# Patient Record
Sex: Male | Born: 1940 | Race: White | Hispanic: No | Marital: Married | Smoking: Former smoker
Health system: Southern US, Community
[De-identification: ages and names within clinical notes are randomized; demographics above are authoritative.]

## PROBLEM LIST (undated history)

## (undated) DIAGNOSIS — E785 Hyperlipidemia, unspecified: Secondary | ICD-10-CM

## (undated) DIAGNOSIS — Z860100 Personal history of colon polyps, unspecified: Secondary | ICD-10-CM

## (undated) DIAGNOSIS — R7303 Prediabetes: Secondary | ICD-10-CM

## (undated) DIAGNOSIS — C4491 Basal cell carcinoma of skin, unspecified: Secondary | ICD-10-CM

## (undated) DIAGNOSIS — Z974 Presence of external hearing-aid: Secondary | ICD-10-CM

## (undated) DIAGNOSIS — I10 Essential (primary) hypertension: Secondary | ICD-10-CM

## (undated) DIAGNOSIS — E669 Obesity, unspecified: Secondary | ICD-10-CM

## (undated) DIAGNOSIS — H269 Unspecified cataract: Secondary | ICD-10-CM

## (undated) DIAGNOSIS — H35033 Hypertensive retinopathy, bilateral: Secondary | ICD-10-CM

## (undated) DIAGNOSIS — E66811 Obesity, class 1: Secondary | ICD-10-CM

## (undated) DIAGNOSIS — K5730 Diverticulosis of large intestine without perforation or abscess without bleeding: Secondary | ICD-10-CM

## (undated) HISTORY — DX: Obesity, class 1: E66.811

## (undated) HISTORY — DX: Prediabetes: R73.03

## (undated) HISTORY — DX: Basal cell carcinoma of skin, unspecified: C44.91

## (undated) HISTORY — DX: Hypertensive retinopathy, bilateral: H35.033

## (undated) HISTORY — DX: Personal history of colonic polyps: Z86.010

## (undated) HISTORY — DX: Personal history of colon polyps, unspecified: Z86.0100

## (undated) HISTORY — PX: POLYPECTOMY: SHX149

## (undated) HISTORY — DX: Hyperlipidemia, unspecified: E78.5

## (undated) HISTORY — DX: Essential (primary) hypertension: I10

## (undated) HISTORY — DX: Diverticulosis of large intestine without perforation or abscess without bleeding: K57.30

## (undated) HISTORY — DX: Presence of external hearing-aid: Z97.4

## (undated) HISTORY — DX: Obesity, unspecified: E66.9

## (undated) HISTORY — DX: Unspecified cataract: H26.9

---

## 1997-05-22 ENCOUNTER — Encounter: Payer: Self-pay | Admitting: Family Medicine

## 1997-05-22 LAB — CONVERTED CEMR LAB: PSA: 1.2 ng/mL

## 1999-06-22 ENCOUNTER — Encounter: Payer: Self-pay | Admitting: Family Medicine

## 1999-06-22 LAB — CONVERTED CEMR LAB: PSA: 1.4 ng/mL

## 1999-08-20 ENCOUNTER — Encounter (INDEPENDENT_AMBULATORY_CARE_PROVIDER_SITE_OTHER): Payer: Self-pay

## 1999-08-20 ENCOUNTER — Other Ambulatory Visit: Admission: RE | Admit: 1999-08-20 | Discharge: 1999-08-20 | Payer: Self-pay | Admitting: Gastroenterology

## 2001-02-19 ENCOUNTER — Encounter: Payer: Self-pay | Admitting: Family Medicine

## 2001-02-19 LAB — CONVERTED CEMR LAB: PSA: 1.9 ng/mL

## 2001-05-10 LAB — FECAL OCCULT BLOOD, GUAIAC: Fecal Occult Blood: NEGATIVE

## 2002-07-22 ENCOUNTER — Encounter: Payer: Self-pay | Admitting: Family Medicine

## 2003-01-20 ENCOUNTER — Encounter: Payer: Self-pay | Admitting: Family Medicine

## 2003-02-20 ENCOUNTER — Encounter: Payer: Self-pay | Admitting: Family Medicine

## 2003-11-08 ENCOUNTER — Encounter: Admission: RE | Admit: 2003-11-08 | Discharge: 2003-11-08 | Payer: Self-pay | Admitting: General Surgery

## 2003-11-12 ENCOUNTER — Ambulatory Visit (HOSPITAL_BASED_OUTPATIENT_CLINIC_OR_DEPARTMENT_OTHER): Admission: RE | Admit: 2003-11-12 | Discharge: 2003-11-12 | Payer: Self-pay | Admitting: General Surgery

## 2003-11-12 ENCOUNTER — Ambulatory Visit (HOSPITAL_COMMUNITY): Admission: RE | Admit: 2003-11-12 | Discharge: 2003-11-12 | Payer: Self-pay | Admitting: General Surgery

## 2003-11-20 ENCOUNTER — Encounter: Payer: Self-pay | Admitting: Family Medicine

## 2004-01-07 ENCOUNTER — Ambulatory Visit (HOSPITAL_BASED_OUTPATIENT_CLINIC_OR_DEPARTMENT_OTHER): Admission: RE | Admit: 2004-01-07 | Discharge: 2004-01-07 | Payer: Self-pay | Admitting: General Surgery

## 2004-01-07 ENCOUNTER — Ambulatory Visit (HOSPITAL_COMMUNITY): Admission: RE | Admit: 2004-01-07 | Discharge: 2004-01-07 | Payer: Self-pay | Admitting: General Surgery

## 2004-01-07 HISTORY — PX: UMBILICAL HERNIA REPAIR: SHX196

## 2004-10-24 ENCOUNTER — Ambulatory Visit: Payer: Self-pay | Admitting: Gastroenterology

## 2004-11-07 ENCOUNTER — Ambulatory Visit: Payer: Self-pay | Admitting: Gastroenterology

## 2005-01-27 ENCOUNTER — Ambulatory Visit: Payer: Self-pay | Admitting: Family Medicine

## 2005-06-21 ENCOUNTER — Encounter: Payer: Self-pay | Admitting: Family Medicine

## 2005-06-21 LAB — CONVERTED CEMR LAB: Hgb A1c MFr Bld: 5.5 %

## 2005-07-10 ENCOUNTER — Ambulatory Visit: Payer: Self-pay | Admitting: Family Medicine

## 2005-07-14 ENCOUNTER — Ambulatory Visit: Payer: Self-pay | Admitting: Family Medicine

## 2005-08-21 ENCOUNTER — Encounter: Payer: Self-pay | Admitting: Family Medicine

## 2005-08-21 LAB — CONVERTED CEMR LAB: PSA: 2.65 ng/mL

## 2005-09-09 ENCOUNTER — Ambulatory Visit: Payer: Self-pay | Admitting: Family Medicine

## 2005-09-16 ENCOUNTER — Ambulatory Visit: Payer: Self-pay | Admitting: Family Medicine

## 2005-11-19 ENCOUNTER — Encounter: Payer: Self-pay | Admitting: Family Medicine

## 2005-12-16 ENCOUNTER — Ambulatory Visit: Payer: Self-pay | Admitting: Family Medicine

## 2006-06-21 ENCOUNTER — Encounter: Payer: Self-pay | Admitting: Family Medicine

## 2006-06-21 LAB — CONVERTED CEMR LAB: PSA: 2.15 ng/mL

## 2006-07-14 ENCOUNTER — Ambulatory Visit: Payer: Self-pay | Admitting: Family Medicine

## 2006-07-14 LAB — CONVERTED CEMR LAB
Hgb A1c MFr Bld: 6.1 %
Microalbumin U total vol: 5.2 mg/L

## 2006-07-16 ENCOUNTER — Ambulatory Visit: Payer: Self-pay | Admitting: Family Medicine

## 2006-08-16 ENCOUNTER — Ambulatory Visit: Payer: Self-pay | Admitting: Family Medicine

## 2007-02-10 ENCOUNTER — Ambulatory Visit: Payer: Self-pay | Admitting: Family Medicine

## 2007-02-10 LAB — CONVERTED CEMR LAB
AST: 32 units/L (ref 0–37)
Bilirubin, Direct: 0.3 mg/dL (ref 0.0–0.3)
CO2: 31 meq/L (ref 19–32)
Chloride: 102 meq/L (ref 96–112)
Cholesterol: 139 mg/dL (ref 0–200)
Creatinine, Ser: 1 mg/dL (ref 0.4–1.5)
Creatinine,U: 389.7 mg/dL
Glucose, Bld: 103 mg/dL — ABNORMAL HIGH (ref 70–99)
HDL: 53 mg/dL (ref >39.0)
LDL Cholesterol: 76 mg/dL (ref 0–99)
Sodium: 140 meq/L (ref 135–145)
Total Bilirubin: 1.7 mg/dL — ABNORMAL HIGH (ref 0.3–1.2)
Total Protein: 7.3 g/dL (ref 6.0–8.3)

## 2007-02-16 ENCOUNTER — Encounter: Payer: Self-pay | Admitting: Family Medicine

## 2007-02-16 DIAGNOSIS — F528 Other sexual dysfunction not due to a substance or known physiological condition: Secondary | ICD-10-CM

## 2007-02-16 DIAGNOSIS — K644 Residual hemorrhoidal skin tags: Secondary | ICD-10-CM | POA: Insufficient documentation

## 2007-02-16 DIAGNOSIS — R972 Elevated prostate specific antigen [PSA]: Secondary | ICD-10-CM | POA: Insufficient documentation

## 2007-02-17 ENCOUNTER — Ambulatory Visit: Payer: Self-pay | Admitting: Family Medicine

## 2007-06-11 ENCOUNTER — Encounter: Payer: Self-pay | Admitting: Family Medicine

## 2007-06-11 ENCOUNTER — Ambulatory Visit: Payer: Self-pay | Admitting: Family Medicine

## 2007-07-01 ENCOUNTER — Ambulatory Visit: Payer: Self-pay | Admitting: Family Medicine

## 2007-07-20 ENCOUNTER — Ambulatory Visit: Payer: Self-pay | Admitting: Family Medicine

## 2007-07-20 LAB — CONVERTED CEMR LAB
AST: 21 units/L (ref 0–37)
Alkaline Phosphatase: 85 units/L (ref 39–117)
BUN: 23 mg/dL (ref 6–23)
CO2: 30 meq/L (ref 19–32)
Chloride: 97 meq/L (ref 96–112)
Creatinine, Ser: 0.8 mg/dL (ref 0.4–1.5)
HDL: 59.9 mg/dL (ref >39.0)
Microalb, Ur: 2.5 mg/dL — ABNORMAL HIGH (ref 0.0–1.9)
PSA: 2.92 ng/mL (ref 0.10–4.00)
Potassium: 3.8 meq/L (ref 3.5–5.1)
Sodium: 135 meq/L (ref 135–145)
TSH: 1.47 microintl units/mL (ref 0.35–5.50)
Total Bilirubin: 1.4 mg/dL — ABNORMAL HIGH (ref 0.3–1.2)
Total Protein: 7.8 g/dL (ref 6.0–8.3)
Triglycerides: 68 mg/dL (ref 0–149)

## 2007-07-25 ENCOUNTER — Ambulatory Visit: Payer: Self-pay | Admitting: Family Medicine

## 2007-10-28 ENCOUNTER — Ambulatory Visit: Payer: Self-pay | Admitting: Gastroenterology

## 2007-11-11 ENCOUNTER — Encounter: Payer: Self-pay | Admitting: Family Medicine

## 2007-11-11 ENCOUNTER — Encounter: Payer: Self-pay | Admitting: Gastroenterology

## 2007-11-11 ENCOUNTER — Ambulatory Visit: Payer: Self-pay | Admitting: Gastroenterology

## 2007-12-23 ENCOUNTER — Ambulatory Visit: Payer: Self-pay | Admitting: Family Medicine

## 2007-12-28 ENCOUNTER — Ambulatory Visit: Payer: Self-pay | Admitting: Family Medicine

## 2008-06-20 ENCOUNTER — Ambulatory Visit: Payer: Self-pay | Admitting: Family Medicine

## 2008-07-27 ENCOUNTER — Ambulatory Visit: Payer: Self-pay | Admitting: Family Medicine

## 2008-07-29 LAB — CONVERTED CEMR LAB
Alkaline Phosphatase: 61 units/L (ref 39–117)
Basophils Absolute: 0.1 10*3/uL (ref 0.0–0.1)
Bilirubin, Direct: 0.1 mg/dL (ref 0.0–0.3)
Calcium: 9.5 mg/dL (ref 8.4–10.5)
Cholesterol: 140 mg/dL (ref 0–200)
Eosinophils Absolute: 0 10*3/uL (ref 0.0–0.7)
GFR calc Af Amer: 109 mL/min
GFR calc non Af Amer: 90 mL/min
Glucose, Bld: 107 mg/dL — ABNORMAL HIGH (ref 70–99)
HCT: 46.2 % (ref 39.0–52.0)
HDL: 56.1 mg/dL (ref >39.0)
Hemoglobin: 16.2 g/dL (ref 13.0–17.0)
Hgb A1c MFr Bld: 5.6 % (ref 4.6–6.0)
MCHC: 35.1 g/dL (ref 30.0–36.0)
MCV: 92.7 fL (ref 78.0–100.0)
Microalb Creat Ratio: 6.8 mg/g (ref 0.0–30.0)
Microalb, Ur: 1.1 mg/dL (ref 0.0–1.9)
Monocytes Absolute: 0.8 10*3/uL (ref 0.1–1.0)
Neutro Abs: 5.1 10*3/uL (ref 1.4–7.7)
Platelets: 232 10*3/uL (ref 150–400)
Potassium: 3.6 meq/L (ref 3.5–5.1)
RDW: 11.8 % (ref 11.5–14.6)
Sodium: 134 meq/L — ABNORMAL LOW (ref 135–145)
TSH: 1.73 microintl units/mL (ref 0.35–5.50)
Total CHOL/HDL Ratio: 2.5
Total Protein: 7.4 g/dL (ref 6.0–8.3)
Triglycerides: 72 mg/dL (ref 0–149)

## 2008-08-01 ENCOUNTER — Ambulatory Visit: Payer: Self-pay | Admitting: Family Medicine

## 2009-01-23 ENCOUNTER — Ambulatory Visit: Payer: Self-pay | Admitting: Family Medicine

## 2009-01-23 LAB — CONVERTED CEMR LAB: Hgb A1c MFr Bld: 5.8 % (ref 4.6–6.5)

## 2009-01-30 ENCOUNTER — Ambulatory Visit: Payer: Self-pay | Admitting: Family Medicine

## 2009-06-19 ENCOUNTER — Ambulatory Visit: Payer: Self-pay | Admitting: Family Medicine

## 2009-08-05 ENCOUNTER — Ambulatory Visit: Payer: Self-pay | Admitting: Family Medicine

## 2009-08-05 LAB — CONVERTED CEMR LAB
ALT: 34 units/L (ref 0–53)
BUN: 16 mg/dL (ref 6–23)
Basophils Relative: 0.3 % (ref 0.0–3.0)
CO2: 30 meq/L (ref 19–32)
Calcium: 9.6 mg/dL (ref 8.4–10.5)
Chloride: 97 meq/L (ref 96–112)
Cholesterol: 162 mg/dL (ref 0–200)
Creatinine, Ser: 0.9 mg/dL (ref 0.4–1.5)
Eosinophils Absolute: 0.1 10*3/uL (ref 0.0–0.7)
HDL: 53.9 mg/dL (ref >39.00)
Hemoglobin: 16.6 g/dL (ref 13.0–17.0)
Lymphs Abs: 2 10*3/uL (ref 0.7–4.0)
MCHC: 34.6 g/dL (ref 30.0–36.0)
MCV: 93.6 fL (ref 78.0–100.0)
Monocytes Absolute: 0.9 10*3/uL (ref 0.1–1.0)
Neutro Abs: 6.5 10*3/uL (ref 1.4–7.7)
RBC: 5.12 M/uL (ref 4.22–5.81)
RDW: 11.7 % (ref 11.5–14.6)
Total Bilirubin: 1.6 mg/dL — ABNORMAL HIGH (ref 0.3–1.2)
Total Protein: 7.4 g/dL (ref 6.0–8.3)
Triglycerides: 103 mg/dL (ref 0.0–149.0)

## 2009-08-08 ENCOUNTER — Ambulatory Visit: Payer: Self-pay | Admitting: Family Medicine

## 2010-02-03 ENCOUNTER — Ambulatory Visit: Payer: Self-pay | Admitting: Family Medicine

## 2010-02-10 ENCOUNTER — Ambulatory Visit: Payer: Self-pay | Admitting: Family Medicine

## 2010-04-24 ENCOUNTER — Encounter (INDEPENDENT_AMBULATORY_CARE_PROVIDER_SITE_OTHER): Payer: Self-pay | Admitting: *Deleted

## 2010-06-11 ENCOUNTER — Ambulatory Visit: Payer: Self-pay | Admitting: Family Medicine

## 2010-06-19 ENCOUNTER — Ambulatory Visit: Payer: Self-pay | Admitting: Internal Medicine

## 2010-06-19 DIAGNOSIS — H6120 Impacted cerumen, unspecified ear: Secondary | ICD-10-CM

## 2010-08-06 ENCOUNTER — Telehealth (INDEPENDENT_AMBULATORY_CARE_PROVIDER_SITE_OTHER): Payer: Self-pay | Admitting: *Deleted

## 2010-08-07 ENCOUNTER — Ambulatory Visit: Payer: Self-pay | Admitting: Family Medicine

## 2010-08-07 DIAGNOSIS — E559 Vitamin D deficiency, unspecified: Secondary | ICD-10-CM

## 2010-08-07 LAB — CONVERTED CEMR LAB
ALT: 30 units/L (ref 0–53)
AST: 28 units/L (ref 0–37)
Albumin: 4.5 g/dL (ref 3.5–5.2)
Alkaline Phosphatase: 71 units/L (ref 39–117)
CO2: 30 meq/L (ref 19–32)
Chloride: 94 meq/L — ABNORMAL LOW (ref 96–112)
GFR calc non Af Amer: 90.4 mL/min (ref >60)
Glucose, Bld: 116 mg/dL — ABNORMAL HIGH (ref 70–99)
PSA: 2.98 ng/mL (ref 0.10–4.00)
Potassium: 4.3 meq/L (ref 3.5–5.1)
Sodium: 134 meq/L — ABNORMAL LOW (ref 135–145)
VLDL: 14.4 mg/dL (ref 0.0–40.0)

## 2010-08-08 LAB — CONVERTED CEMR LAB: Vit D, 25-Hydroxy: 58 ng/mL (ref 30–89)

## 2010-08-11 ENCOUNTER — Ambulatory Visit: Payer: Self-pay | Admitting: Internal Medicine

## 2010-08-11 DIAGNOSIS — E119 Type 2 diabetes mellitus without complications: Secondary | ICD-10-CM

## 2010-08-11 DIAGNOSIS — E118 Type 2 diabetes mellitus with unspecified complications: Secondary | ICD-10-CM | POA: Insufficient documentation

## 2010-10-21 NOTE — Progress Notes (Signed)
----   Converted from flag ---- ---- 08/05/2010 2:06 PM, Eustaquio Boyden  MD wrote: and vit D - [phone] ------------------------------

## 2010-10-21 NOTE — Assessment & Plan Note (Signed)
Summary: FLU SHOT/CLE   Nurse Visit   Allergies: No Known Drug Allergies  Immunizations Administered:  Influenza Vaccine # 1:    Vaccine Type: Fluvax 3+    Site: left deltoid    Mfr: GlaxoSmithKline    Dose: 0.5 ml    Route: IM    Given by: Mervin Hack CMA (AAMA)    Exp. Date: 03/21/2011    Lot #: [id number]    VIS given: 04/15/10 version given June 11, 2010.  Flu Vaccine Consent Questions:    Do you have a history of severe allergic reactions to this vaccine? no    Any prior history of allergic reactions to egg and/or gelatin? no    Do you have a sensitivity to the preservative Thimersol? no    Do you have a past history of Guillan-Barre Syndrome? no    Do you currently have an acute febrile illness? no    Have you ever had a severe reaction to latex? no    Vaccine information given and explained to patient? yes  Orders Added: 1)  Flu Vaccine 48yrs + [90658] 2)  Admin 1st Vaccine [29562]

## 2010-10-21 NOTE — Letter (Signed)
Summary: Nadara Eaton letter  [place] at [facility]  [address] [place], [place] [address]   Phone: [phone]  Fax: [phone]       04/24/2010 MRN: [id number]  [facility] [address] [place], [place]  [address]  Dear Mr. [facility]und[facility] [place], and [facility] announce the retirement of Arta Silence, M.D., from full-time practice at the [facility] office effective March 20, 2010 and his plans of returning part-time.  It is important to Dr. Hetty Ely and to our practice that you understand that [facility] has seven physicians in our office for your health care needs.  We will continue to offer the same exceptional care that you have today.    Dr. Hetty Ely has spoken to many of you about his plans for retirement and returning part-time in the fall.   We will continue to work with you through the transition to schedule appointments for you in the office and meet the high standards that [facility] is committed to.   Again, it is with great pleasure that we share the news that Dr. Hetty Ely will return to [facility] in October of 2011 with a reduced schedule.    If you have any questions, or would like to request an appointment with one of our physicians, please call us at [phone] and press the option for Scheduling an appointment.  We take pleasure in providing you with excellent patient care and look forward to seeing you at your next office visit.  Our [facility] Physicians are:  Tillman Abide, M.D. Laurita Quint, M.D. Roxy Manns, M.D. Kerby Nora, M.D. Hannah Beat, M.D. Ruthe Mannan, M.D. We proudly welcomed Raechel Ache, M.D. and Eustaquio Boyden, M.D. to the practice in July/August 2011.  Sincerely,  [facility]

## 2010-10-21 NOTE — Assessment & Plan Note (Signed)
Summary: 6 MONTH FOLLOW UP/[facility]H   Vital Signs:  Patient profile:   70 year old male Weight:      193.25 pounds BMI:     29.71 Temp:     97.8 degrees F oral Pulse rate:   76 / minute Pulse rhythm:   regular BP sitting:   130 / 78  (left arm) Cuff size:   large  Vitals Entered By: Sydell Axon LPN (Feb 10, 2010 8:08 AM) CC: 6 Month follow-up   History of Present Illness: Pt here for 6 month followup. He still gets constipated occass. He occas takes a laxative.  He sleeps well. He occas checks his sugar and it has been doing well. He is currently out of strips.  He has had no probs with BP, no headaches. He checks it at home and it has been fine. He walks daily in the AM, did today.  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 Take One By Mouth Daily 2)  Tenoretic 50 50-25 Mg Tabs (Atenolol-Chlorthalidone) .... Take One By Mouth Daily 3)  Viagra 100 Mg Tabs (Sildenafil Citrate) .... Take One By Mouth As Directed Prn 4)  Enalapril Maleate 10 Mg  Tabs (Enalapril Maleate) .Marland Kitchen.. 1 Twice A Day By Mouth 5)  Lipitor 80 Mg  Tabs (Atorvastatin Calcium) .Marland Kitchen.. 1 Tablet At Bedtime 6)  Fish Oil Concentrate 1000 Mg Caps (Omega-3 Fatty Acids) .Marland Kitchen.. 1 Daily By Mouth  Allergies: No Known Drug Allergies  Physical Exam  General:  Well-developed,well-nourished,in no acute distress;  alert,appropriate and cooperative throughout examination, looks good. Head:  Normocephalic and atraumatic without obvious abnormalities. No apparent alopecia or balding. Sinuses NT. Eyes:  Conjunctiva clear bilaterally.  Ears:  External ear exam shows no significant lesions or deformities.  Otoscopic examination reveals clear canals, tympanic membranes are intact bilaterally without bulging, retraction, inflammation or discharge. Hearing is grossly normal bilaterally. Cerumen bilat. Nose:  External nasal examination shows no deformity or inflammation. Nasal mucosa are pink and moist without lesions or exudates. Mouth:  Oral mucosa and oropharynx without lesions or exudates.  Teeth in good repair. Neck:  No deformities, masses, or tenderness noted. Chest Wall:  No deformities, masses, tenderness or gynecomastia noted. Lungs:  Normal respiratory effort, chest expands symmetrically. Lungs are clear to auscultation, no crackles or wheezes. Heart:  Normal rate and regular rhythm. S1 and S2 normal without gallop, murmur, click, rub or other extra sounds. Abdomen:  Bowel sounds positive,abdomen soft and non-tender without masses or organomegaly. Mild ventral hernia noted.   Impression & Recommendations:  Problem # 1:  DIABETES MELLITUS, TYPE II (ICD-250.00) Assessment Unchanged Stable, good control. Cont curr lifestyle. Cont walking regularly like he does. His updated medication list for this problem includes:    Aspirin 81 Mg Tbec (Aspirin) .Marland Kitchen... Take one by mouth daily    Enalapril Maleate 10 Mg Tabs (Enalapril maleate) .Marland KitchenMarland KitchenMarland KitchenMarland Kitchen  1 twice a day by mouth  Labs Reviewed: Creat: 0.9 (08/05/2009)   Microalbumin: 5.2 (07/14/2006) Reviewed HgBA1c results: 5.9 (02/03/2010)  5.9 (08/05/2009)  Problem # 2:  HYPERTENSION (ICD-401.9) Assessment: Unchanged Stable. Cont curr meds. His updated medication list for this problem includes:    Tenoretic 50 50-25 Mg Tabs (Atenolol-chlorthalidone) .Marland Kitchen... Take one  by mouth daily    Enalapril Maleate 10 Mg Tabs (Enalapril maleate) .Marland Kitchen... 1 twice a day by mouth  BP today: 130/78 Prior BP: 140/88 (08/08/2009)  Labs Reviewed: K+: 3.8 (08/05/2009) Creat: : 0.9 (08/05/2009)   Chol: 162 (08/05/2009)   HDL: 53.90 (08/05/2009)   LDL: 88 (08/05/2009)   TG: 103.0 (08/05/2009)  Complete Medication List: 1)  Aspirin 81 Mg Tbec (Aspirin) .... Take one by mouth daily 2)  Tenoretic 50 50-25 Mg Tabs (Atenolol-chlorthalidone) .... Take one by mouth daily 3)  Viagra 100 Mg Tabs (Sildenafil citrate) .... Take one by mouth as directed as needed 4)  Enalapril Maleate 10 Mg Tabs (Enalapril maleate) .Marland Kitchen.. 1 twice a day by mouth 5)  Lipitor 80 Mg Tabs (Atorvastatin calcium) .Marland Kitchen.. 1 tablet at bedtime 6)  Fish Oil Concentrate 1000 Mg Caps (Omega-3 fatty acids) .Marland Kitchen.. 1 daily by mouth 7)  Vitamin D 1000 Unit Tabs (Cholecalciferol) .... Take one by mouth daily  Patient Instructions: 1)  Try Citrucel 1 tsp in 8 oz of water every AM. 2)  Call in 2-3 weeks for Comp Exam appt with labs prior  Current Allergies (reviewed today): No known allergies

## 2010-10-21 NOTE — Assessment & Plan Note (Signed)
Summary: EARS STOPPED UP/DLO   Vital Signs:  Patient profile:   70 year old male Height:      67.75 inches Weight:      197.50 pounds Temp:     98.2 degrees F oral Pulse rate:   72 / minute Pulse rhythm:   regular BP sitting:   142 / 82  (left arm) Cuff size:   large  Vitals Entered By: Selena Batten Dance CMA Duncan Dull) (June 19, 2010 8:06 AM) CC: Check ears (stopped up)   History of Present Illness: CC: ears stopped up?  almost 1 wk h/o ear stopped up on R.  Tried cleaning with debrox.  Ear pain saturday morning (6 days ago), but no longer hurting.  No ringing in ears.  Is hearing "drum beat".  No fevers/chills, congestion, ST.  no h/o middle ear infections or swimmers ears recently.  no drainage  Current Medications (verified): 1)  Aspirin 81 Mg Tbec (Aspirin) .... Take One By Mouth Daily 2)  Tenoretic 50 50-25 Mg Tabs (Atenolol-Chlorthalidone) .... Take One By Mouth Daily 3)  Viagra 100 Mg Tabs (Sildenafil Citrate) .... Take One By Mouth As Directed As Needed 4)  Enalapril Maleate 10 Mg  Tabs (Enalapril Maleate) .Marland Kitchen.. 1 Twice A Day By Mouth 5)  Lipitor 80 Mg  Tabs (Atorvastatin Calcium) .Marland Kitchen.. 1 Tablet At Bedtime 6)  Fish Oil Concentrate 1000 Mg Caps (Omega-3 Fatty Acids) .Marland Kitchen.. 1 Daily By Mouth 7)  Vitamin D 1000 Unit Tabs (Cholecalciferol) .... Take One By Mouth Daily  Allergies (verified): No Known Drug Allergies PMH-FH-SH reviewed for relevance  Review of Systems       per HPI  Physical Exam  General:  Well-developed,well-nourished,in no acute distress; alert,appropriate and cooperative throughout examination, looks good. Head:  Normocephalic and atraumatic without obvious abnormalities. No apparent alopecia or balding. no mastoid tenderness bilaterally Eyes:  Conjunctiva clear bilaterally.  Ears:  cerumen impaction bilaterally.  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 Nasal mucosa are pink and  moist without lesions or exudates. Mouth:  Oral mucosa and oropharynx without lesions or exudates.  Teeth in good repair. Neck:  No deformities, masses, or tenderness noted. Additional Exam:  ear irrigation performed.   Impression & Recommendations:  Problem # 1:  CERUMEN IMPACTION, RIGHT (ICD-380.4) irrigation performed.  afterwards, able to visualize ear canal and TM well.  sent home with ear drops to prevent infection.  discussed care of ear to help prevent accumulation of cerumen in future.  Orders: Cerumen Impaction Removal (73710)  Complete Medication List: 1)  Aspirin 81 Mg Tbec (Aspirin) .... Take one by mouth daily 2)  Tenoretic 50 50-25 Mg Tabs (Atenolol-chlorthalidone) .... Take one by mouth daily 3)  Viagra 100 Mg Tabs (Sildenafil citrate) .... Take one by mouth as directed as needed 4)  Enalapril Maleate 10 Mg Tabs (Enalapril maleate) .Marland Kitchen.. 1 twice a day by mouth 5)  Lipitor 80 Mg Tabs (Atorvastatin calcium) .Marland Kitchen.. 1 tablet at bedtime 6)  Fish Oil Concentrate 1000 Mg Caps (Omega-3 fatty acids) .Marland Kitchen.. 1 daily by mouth 7)  Vitamin D 1000 Unit Tabs (Cholecalciferol) .... Take one by mouth daily 8)  Cipro Hc 0.2-1 % Susp (Ciprofloxacin-hydrocortisone) .... Apply to ear two times a day x 7 days  Patient Instructions: 1)  Please return for CPE after 07/25/2010.  Come in fasting or come in a few days prior fasting for blood work. 2)  You had ear impaction today.  Try cleaning with very dilute peroxide once every few weeks.  Don't use debrox more than 3-4 days in a row.  Don't use qtips. 3)  For prevention of accumulation, you could try placing a mineral oil soaked cotton ball into ear for 10-20 min every week. 4)  Your ear will likely be irritated for several hours after irrigation performed.  Provided with antibiotic ear drop in case pain returns. 5)  Good to see you today.  call clinic with questions. Prescriptions: CIPRO HC 0.2-1 % SUSP (CIPROFLOXACIN-HYDROCORTISONE) apply to ear  two times a day x 7 days  #1 x 0   Entered and Authorized by:   Eustaquio Boyden  MD   Signed by:   Eustaquio Boyden  MD on 06/19/2010   Method used:   Print then Give to Patient   RxID:   [id number]   Current Allergies (reviewed today): No known allergies

## 2010-10-21 NOTE — Assessment & Plan Note (Signed)
Summary: CPX/CLE   Vital Signs:  Patient profile:   70 year old male Weight:      196.50 pounds Temp:     97.8 degrees F oral Pulse rate:   76 / minute Pulse rhythm:   regular BP sitting:   130 / 90  (left arm) Cuff size:   large  Vitals Entered By: Selena Batten Dance CMA (AAMA) (August 11, 2010 8:20 AM) CC: CPx   History of Present Illness: CC: CPE  HLD - asks about generic lipitor. HTN - a bit elevated this AM.  no HA, vision changes, chest pain, tightness, urinary changes, LE swelling prediabetes - glu has been consistently in this range for years.  pt does know to watch diet and walks for 1 hour at gym 6/7 days/wk.  last cpe last year: prostate - always normal.  nocturia x 1 maybe.  strong stream.   colon - polyps 2009 Dr. Russella Dar, due 2014.  no BM changes.  + occasional blood in stool, none recently.  + h/o hemorrhoids.  watches. flu shot done tetanus shot - 2003  Current Medications (verified): 1)  Aspirin 81 Mg Tbec (Aspirin) .... Take One By Mouth Daily 2)  Tenoretic 50 50-25 Mg Tabs (Atenolol-Chlorthalidone) .... Take One By Mouth Daily 3)  Viagra 100 Mg Tabs (Sildenafil Citrate) .... Take One By Mouth As Directed As Needed 4)  Enalapril Maleate 10 Mg  Tabs (Enalapril Maleate) .Marland Kitchen.. 1 Twice A Day By Mouth 5)  Lipitor 80 Mg  Tabs (Atorvastatin Calcium) .Marland Kitchen.. 1 Tablet At Bedtime 6)  Fish Oil Concentrate 1000 Mg Caps (Omega-3 Fatty Acids) .... 2 Daily By Mouth 7)  Vitamin D 1000 Unit Tabs (Cholecalciferol) .... Take One By Mouth Daily  Allergies (verified): No Known Drug Allergies  Past History:  Past Surgical History: Last updated: 08/01/2008 Cardiolite- normal EF 69% (11/20/2003) Umb. 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    Physical Exam  General:  Well-developed,well-nourished,in no acute distress; alert,appropriate and cooperative throughout examination, looks good. Head:  Normocephalic and atraumatic without obvious abnormalities. No apparent alopecia or balding. no mastoid tenderness bilaterally Eyes:  Conjunctiva clear bilaterally.  Ears:  TMs clear bilaterally Mouth:  Oral mucosa and oropharynx without lesions or exudates.  Teeth in good repair. Neck:  No deformities, masses, or tenderness noted. Lungs:  Normal respiratory effort, chest expands symmetrically. Lungs are clear to auscultation, no crackles or wheezes. Heart:  Normal rate and regular rhythm. S1 and S2 normal without gallop, murmur, click, rub or other extra sounds. Abdomen:  Bowel sounds positive,abdomen soft and non-tender without masses or organomegaly. Mild ventral hernia noted. Rectal:  No external abnormalities noted. Normal sphincter tone. No rectal masses or tenderness. G neg, deflated noninflammed ext hemm. Genitalia:  Testes bilaterally descended without nodularity, tenderness or masses. No scrotal masses or lesions. No penis lesions or urethral discharge. Prostate:  Prostate gland firm and smooth, no enlargement, nodularity,  tenderness, mass, asymmetry or induration. 20 gms. Msk:  No deformity or scoliosis noted of thoracic or lumbar spine.   Pulses:  2+ rad pulses Extremities:  no pedal edema Neurologic:   CN grossly intact, station and gait intact Skin:  Intact without suspicious lesions or rashes Psych:  full affect   Impression & Recommendations:  Problem # 1:  HEALTH MAINTENANCE EXAM (ICD-V70.0) Reviewed preventive care protocols, scheduled due services, and updated immunizations.  UTD shots, colon, prostate.  Problem # 2:  PREDIABETES (ICD-790.29) changed from T2DM to prediabetes dx.  discussed healthy eating, weight loss by walking.  pt participates in TOPS.  Labs Reviewed: Creat: 0.9 (08/07/2010)   Microalbumin: 5.2 (07/14/2006) Reviewed HgBA1c results: 5.9 (02/03/2010)  5.9 (08/05/2009)  Problem # 3:  Hx of HYPERLIPIDEMIA, MIXED (ICD-272.2) change to generic lipitor at beginning of next year. His updated medication list for this problem includes:    Lipitor 80 Mg Tabs (Atorvastatin calcium) .Marland Kitchen... 1 tablet at bedtime  Labs Reviewed: SGOT: 28 (08/07/2010)   SGPT: 30 (08/07/2010)   HDL:56.90 (08/07/2010), 53.90 (08/05/2009)  LDL:101 (08/07/2010), 88 (43/32/9518)  Chol:172 (08/07/2010), 162 (08/05/2009)  Trig:72.0 (08/07/2010), 103.0 (08/05/2009)  Problem # 4:  HYPERTENSION (ICD-401.9) good control.  continue meds.   His updated medication list for this problem includes:    Tenoretic 50 50-25 Mg Tabs (Atenolol-chlorthalidone) .Marland Kitchen... Take one by mouth daily    Enalapril Maleate 10 Mg Tabs (Enalapril maleate) .Marland Kitchen... 1 twice a day by mouth  BP today: 130/90 Prior BP: 142/82 (06/19/2010)  Labs Reviewed: K+: 4.3 (08/07/2010) Creat: : 0.9 (08/07/2010)   Chol: 172 (08/07/2010)   HDL: 56.90 (08/07/2010)   LDL: 101 (08/07/2010)   TG: 72.0 (08/07/2010)  Problem # 5:  SPECIAL SCREENING MALIGNANT NEOPLASM OF PROSTATE (ICD-V76.44) DRE/PSA reassuring.  Problem # 6:  SPECIAL SCREENING FOR MALIGNANT NEOPLASMS COLON (ICD-V76.51)  UTD.  guaiac neg todya.  Orders: Hemoccult Guaiac-1 spec.(in office) (82270)  Problem # 7:  UNSPECIFIED VITAMIN D DEFICIENCY (ICD-268.9) normal range with  replacement.  Complete Medication List: 1)  Aspirin 81 Mg Tbec (Aspirin) .... Take one by mouth daily 2)  Tenoretic 50 50-25 Mg Tabs (Atenolol-chlorthalidone) .... Take one by mouth daily 3)  Viagra 100 Mg Tabs (Sildenafil citrate) .... Take one by mouth as directed as needed 4)  Enalapril Maleate 10 Mg Tabs (Enalapril maleate) .Marland Kitchen.. 1 twice a day by mouth 5)  Lipitor 80 Mg Tabs (Atorvastatin calcium) .Marland Kitchen.. 1 tablet at bedtime 6)  Fish Oil Concentrate 1000 Mg Caps (Omega-3 fatty acids) .... 2 daily by mouth 7)  Vitamin D 1000 Unit Tabs (Cholecalciferol) .... Take one by mouth daily  Patient Instructions: 1)  Return in 6 months for f/u (come fasting if you can or fasting a few days prior for blood work) BMP 401.9. 2)  Call us January about lipitor generic 3)  keep working on weight loss. 4)  prostate looking normal today. 5)  Good to see you today.  Call clinic with questions.   Orders Added: 1)  Est. Patient 65& > [99397] 2)  Hemoccult Guaiac-1 spec.(in office) [82270]    Current Allergies (reviewed today): No known allergies     Prevention & Chronic Care Immunizations   Influenza vaccine: Fluvax 3+  (06/11/2010)   Influenza vaccine due: 05/23/2011    Tetanus booster: 06/09/2002: Td   Tetanus booster due: 06/09/2012    Pneumococcal vaccine: Pneumovax  (01/30/2009)   Pneumococcal vaccine deferral: Not indicated  (08/11/2010)    H. zoster vaccine:  08/08/2009: Zostavax   H. zoster vaccine deferral: Not indicated  (08/11/2010)  Colorectal Screening   Hemoccult: Negative  (05/10/2001)   Hemoccult action/deferral: Not indicated  (08/11/2010)    Colonoscopy: Adenomatous Polyp  (11/11/2007)   Colonoscopy due: 11/10/2012  Other Screening   PSA: 2.98  (08/07/2010)   PSA due due: 08/08/2011   Smoking status: quit  (08/08/2009)  Lipids   Total Cholesterol: 172  (08/07/2010)   LDL: 101  (08/07/2010)   LDL Direct: Not documented   HDL: 56.90  (08/07/2010)    Triglycerides: 72.0  (08/07/2010)    SGOT (AST): 28  (08/07/2010)   SGPT (ALT): 30  (08/07/2010)   Alkaline phosphatase: 71  (08/07/2010)   Total bilirubin: 1.1  (08/07/2010)  Hypertension   Last Blood Pressure: 130 / 90  (08/11/2010)   Serum creatinine: 0.9  (08/07/2010)   Serum potassium 4.3  (08/07/2010)  Self-Management Support :    Hypertension self-management support: Not documented    Lipid self-management support: Not documented

## 2010-10-21 NOTE — Progress Notes (Signed)
----   Converted from flag ---- ---- 08/05/2010 2:05 PM, Eustaquio Boyden  MD wrote: CMP, FLP, PSA 272.4, 401.9, 250.00, v76.44  ---- 08/05/2010 8:04 AM, Liane Comber CMA (AAMA) wrote: Lab orders please! Good Morning! This pt is scheduled for cpx labs [place], which labs to draw and dx codes to use? Thanks Tasha ------------------------------

## 2010-12-04 ENCOUNTER — Encounter: Payer: Self-pay | Admitting: Family Medicine

## 2010-12-04 DIAGNOSIS — I10 Essential (primary) hypertension: Secondary | ICD-10-CM

## 2010-12-04 DIAGNOSIS — K5730 Diverticulosis of large intestine without perforation or abscess without bleeding: Secondary | ICD-10-CM

## 2010-12-04 DIAGNOSIS — E119 Type 2 diabetes mellitus without complications: Secondary | ICD-10-CM

## 2010-12-04 DIAGNOSIS — Z86010 Personal history of colonic polyps: Secondary | ICD-10-CM | POA: Insufficient documentation

## 2010-12-04 DIAGNOSIS — E785 Hyperlipidemia, unspecified: Secondary | ICD-10-CM

## 2011-02-03 ENCOUNTER — Other Ambulatory Visit: Payer: Self-pay | Admitting: Family Medicine

## 2011-02-03 DIAGNOSIS — I10 Essential (primary) hypertension: Secondary | ICD-10-CM

## 2011-02-03 NOTE — Assessment & Plan Note (Signed)
[facility]                                 ON-CALL NOTE   Blake Peterson, Blake Peterson                       MRN:          [id number]  DATE:06/11/2007                            DOB:          May 09, 1941    CALLER:  Blake Peterson.   PHONE NUMBER:  [phone].   TIME:  9:43 a.m.   PRIMARY:  [place].   SUBJECTIVE:  Burning and urinary urgency.   ASSESSMENT AND PLAN:  Possible urinary tract infection, to be seen in  Saturday clinic.     Blake Nora, MD  Electronically Signed    AB/MedQ  DD: 06/11/2007  DT: 06/11/2007  Job #: [id number]

## 2011-02-04 ENCOUNTER — Other Ambulatory Visit (INDEPENDENT_AMBULATORY_CARE_PROVIDER_SITE_OTHER): Payer: MEDICARE | Admitting: Family Medicine

## 2011-02-04 ENCOUNTER — Telehealth: Payer: Self-pay | Admitting: Family Medicine

## 2011-02-04 DIAGNOSIS — R972 Elevated prostate specific antigen [PSA]: Secondary | ICD-10-CM

## 2011-02-04 DIAGNOSIS — I10 Essential (primary) hypertension: Secondary | ICD-10-CM

## 2011-02-04 DIAGNOSIS — E782 Mixed hyperlipidemia: Secondary | ICD-10-CM

## 2011-02-04 LAB — BASIC METABOLIC PANEL
CO2: 30 mEq/L (ref 19–32)
Calcium: 9.8 mg/dL (ref 8.4–10.5)
Creatinine, Ser: 0.9 mg/dL (ref 0.4–1.5)
Glucose, Bld: 99 mg/dL (ref 70–99)

## 2011-02-04 NOTE — Telephone Encounter (Signed)
Please notify blood work looking ok.  Kidneys, electrolytes normal.  Sugar normal range (improved from prior). Schedule f/u appt for CPE 6 mo, sooner if needed. Return prior fasting for blood work - order in chart.

## 2011-02-05 NOTE — Telephone Encounter (Signed)
Patient notified. CPX scheduled with labs prior.

## 2011-02-06 NOTE — Op Note (Signed)
NAME:  Blake Peterson, Blake Peterson                        ACCOUNT NO.:  [id number]   MEDICAL RECORD NO.:  [id number]                   PATIENT TYPE:  AMB   LOCATION:  DSC                                  FACILITY:  [facility]   PHYSICIAN:  Adolph Pollack, M.D.            DATE OF BIRTH:  1941-04-20   DATE OF PROCEDURE:  01/07/2004  DATE OF DISCHARGE:                                 OPERATIVE REPORT   PREOPERATIVE DIAGNOSIS:  Umbilical hernia.   POSTOPERATIVE DIAGNOSIS:  Umbilical hernia.   PROCEDURE:  Umbilical hernia repair with mesh.   SURGEON:  Adolph Pollack, M.D.   ANESTHESIA:  General.   INDICATIONS FOR PROCEDURE:  [name] is a 70 year old male who I had seen  back in January.  He had an enlarging umbilical hernia and we planned to get  it repaired.  When seen in holding room prior to his operation earlier this  year, he was noted to have an abnormal EKG and so cardiac clearance was  recommended.  We postponed the operation and now he has received cardiac  clearance and he presents back for it.  His overall health has not changed.  The procedure and the risks were discussed with him preoperatively.   SURGICAL TECHNIQUE:  He was seen in the holding area and brought to the  operating room and placed supine on the operating table and a general  anesthetic was administered.  The hair was clipped around the umbilicus and  the area was sterilely prepped and draped.  Local anesthetic was infiltrated  in the periumbilical region superficially and deep.  A curvilinear incision  was made through the skin and subcutaneous tissue and all the way down to  the fascia inferior to the umbilicus.  I then used the cautery and blunt  dissection to encircle the umbilicus and reduce the hernia contents.  I  dissected the umbilicus free from the fascia and exposed the fascial defect.  The fascial defect measured about 2 to 2.5 cm.  Using electrocautery, I then  created subcutaneous flaps exposing  the fascia and all directions around the  primary hernia.  I then primarily closed the hernia with interrupted 0  Surgilon sutures.  A piece of onlay polypropylene mesh was placed over the  hernia closure with at least 3 cm of overlap circumferentially.  This was  anchored to the repair, itself, with the same Surgilon sutures going right  through the middle of the mesh.  The mesh was then anchored to the fascia  with the running 2-0 Prolene suture.  This provided for more than adequate  coverage.  More local anesthetic was infiltrated around the fascial area.  The wound was irrigated and hemostasis was adequate.  The umbilicus was  reimplanted into the mesh with a single 3-0 Vicryl suture and the  subcutaneous tissue was closed over the mesh with a running 3-0 Vicryl  suture.  The skin was  closed with a 4-0 Monocryl  subcuticular stitch.  Steri-Strips and sterile dressings were applied.  He  tolerated the procedure well without any apparent complication and was taken  to the recovery room in satisfactory good condition.  Discharge instructions  will be given to him and I will see him back in the office in 2-3 weeks.                                               Adolph Pollack, M.D.    Kari Baars  D:  01/07/2004  T:  01/07/2004  Job:  [id number]   cc:   Laurita Quint, M.D.  [address] [place]  [place] [address]  Fax: [phone]

## 2011-06-17 ENCOUNTER — Ambulatory Visit (INDEPENDENT_AMBULATORY_CARE_PROVIDER_SITE_OTHER): Payer: MEDICARE

## 2011-06-17 DIAGNOSIS — Z23 Encounter for immunization: Secondary | ICD-10-CM

## 2011-07-31 ENCOUNTER — Other Ambulatory Visit (INDEPENDENT_AMBULATORY_CARE_PROVIDER_SITE_OTHER): Payer: MEDICARE

## 2011-07-31 DIAGNOSIS — I10 Essential (primary) hypertension: Secondary | ICD-10-CM

## 2011-07-31 DIAGNOSIS — R972 Elevated prostate specific antigen [PSA]: Secondary | ICD-10-CM

## 2011-07-31 DIAGNOSIS — E782 Mixed hyperlipidemia: Secondary | ICD-10-CM

## 2011-07-31 LAB — LIPID PANEL
LDL Cholesterol: 118 mg/dL — ABNORMAL HIGH (ref 0–99)
VLDL: 16.6 mg/dL (ref 0.0–40.0)

## 2011-07-31 LAB — COMPREHENSIVE METABOLIC PANEL
AST: 30 U/L (ref 0–37)
Albumin: 4 g/dL (ref 3.5–5.2)
Alkaline Phosphatase: 82 U/L (ref 39–117)
Calcium: 9.6 mg/dL (ref 8.4–10.5)
Chloride: 96 mEq/L (ref 96–112)
Glucose, Bld: 106 mg/dL — ABNORMAL HIGH (ref 70–99)
Potassium: 3.5 mEq/L (ref 3.5–5.1)
Sodium: 136 mEq/L (ref 135–145)
Total Protein: 7.3 g/dL (ref 6.0–8.3)

## 2011-07-31 LAB — PSA: PSA: 3.25 ng/mL (ref 0.10–4.00)

## 2011-08-07 ENCOUNTER — Encounter: Payer: Self-pay | Admitting: Family Medicine

## 2011-08-07 ENCOUNTER — Ambulatory Visit (INDEPENDENT_AMBULATORY_CARE_PROVIDER_SITE_OTHER): Payer: MEDICARE | Admitting: Family Medicine

## 2011-08-07 VITALS — BP 142/80 | HR 68 | Temp 98.2°F | Wt 194.8 lb

## 2011-08-07 DIAGNOSIS — Z0000 Encounter for general adult medical examination without abnormal findings: Secondary | ICD-10-CM

## 2011-08-07 DIAGNOSIS — E785 Hyperlipidemia, unspecified: Secondary | ICD-10-CM

## 2011-08-07 DIAGNOSIS — I10 Essential (primary) hypertension: Secondary | ICD-10-CM

## 2011-08-07 DIAGNOSIS — R7309 Other abnormal glucose: Secondary | ICD-10-CM

## 2011-08-07 DIAGNOSIS — Z86010 Personal history of colonic polyps: Secondary | ICD-10-CM

## 2011-08-07 MED ORDER — ATORVASTATIN CALCIUM 80 MG PO TABS: 80.0000 mg | ORAL_TABLET | Freq: Every day | ORAL | Status: DC

## 2011-08-07 NOTE — Assessment & Plan Note (Signed)
Stable. Discussed importance of weight loss and limiting sugar intake.

## 2011-08-07 NOTE — Assessment & Plan Note (Signed)
Rpt colonoscopy due 2014.

## 2011-08-07 NOTE — Progress Notes (Signed)
Subjective:    Patient ID: Blake Peterson, male    DOB: Apr 01, 1941, 70 y.o.   MRN: [id number]  HPI CC: CPE  Recently had cold, getting better.  Going on for 10d, getting better.  Took ibuprofen, may have upset stomach.  HLD - asks about generic lipitor.  Would like [place] to switch.  Asks about alopecia with lipitor. HTN - a bit elevated this AM. no HA, vision changes, chest pain, tightness, urinary changes, LE swelling  prediabetes - glu has been consistently in this range for years. pt does know to watch diet and walks for 1 hour at gym 6/7 days/wk.  Has trouble with diet at times.  Belongs to [facility], weight management program.  last cpe last year:  prostate - always normal. nocturia x 1 maybe. strong stream.  Would like to continue screening for next few years.  Uncle with prostate cancer. colon - polyps 2009 Dr. Russella Dar, due 2014. no BM changes. + h/o hemorrhoids. watches. flu shot done this year tetanus shot - 2003 Shingles 2010 Pneumovax 2010  Medications and allergies reviewed and updated in chart.  Past histories reviewed and updated if relevant as below. Patient Active Problem List  Diagnoses  . UNSPECIFIED VITAMIN D DEFICIENCY  . HYPERLIPIDEMIA, MIXED  . GILBERT'S SYNDROME  . ERECTILE DYSFUNCTION  . CERUMEN IMPACTION, RIGHT  . HYPERTENSION  . EXTERNAL HEMORRHOIDS  . DIVERTICULOSIS, COLON  . PREDIABETES  . ELEVATED PROSTATE SPECIFIC ANTIGEN  . BLOOD IN STOOL, OCCULT  . COLONIC POLYPS, HX OF  . History of colonic polyps  . Diabetes mellitus type II  . Diverticulosis of colon  . HTN (hypertension)  . HLD (hyperlipidemia)   Past Medical History  Diagnosis Date  . History of colonic polyps   . Diabetes mellitus type II   . Diverticulosis of colon   . HTN (hypertension)   . HLD (hyperlipidemia)    Past Surgical History  Procedure Date  . Umbilical hernia repair 01/07/04   History  Substance Use Topics  . Smoking status: Never Smoker   . Smokeless tobacco:  Not on file  . Alcohol Use: Yes     occasional   Family History  Problem Relation Age of Onset  . Hypertension Sister   . Hyperlipidemia Sister   . Colon polyps Sister   . Hypertension Sister   . Hyperlipidemia Sister   . Cancer Sister 10    colon-polyp  . Hypertension Sister   . Hyperlipidemia Sister   . Heart attack Father 25   No Known Allergies Current Outpatient Prescriptions on File Prior to Visit  Medication Sig Dispense Refill  . atenolol-chlorthalidone (TENORETIC) 50-25 MG per tablet Take 1 tablet by mouth daily.        Marland Kitchen atorvastatin (LIPITOR) 80 MG tablet Take 80 mg by mouth at bedtime.        . cholecalciferol (VITAMIN D) 1000 UNITS tablet Take 1,000 Units by mouth daily.        . enalapril (VASOTEC) 10 MG tablet Take 10 mg by mouth 2 (two) times daily.        . fish oil-omega-3 fatty acids 1000 MG capsule Take 2 g by mouth daily.        . sildenafil (VIAGRA) 100 MG tablet Take 100 mg by mouth daily as needed.        Marland Kitchen aspirin 81 MG tablet Take 81 mg by mouth daily.         Review of Systems  Constitutional:  Negative for fever, chills, activity change, appetite change, fatigue and unexpected weight change.  HENT: Negative for hearing loss and neck pain.   Eyes: Negative for visual disturbance.  Respiratory: Negative for cough, chest tightness, shortness of breath and wheezing.   Cardiovascular: Negative for chest pain, palpitations and leg swelling.  Gastrointestinal: Negative for nausea, vomiting, abdominal pain, diarrhea, constipation, blood in stool and abdominal distention.  Genitourinary: Negative for hematuria and difficulty urinating.  Musculoskeletal: Negative for myalgias and arthralgias.  Skin: Negative for rash.  Neurological: Negative for dizziness, seizures, syncope and headaches.  Hematological: Does not bruise/bleed easily.  Psychiatric/Behavioral: Negative for dysphoric mood. The patient is not nervous/anxious.        Objective:   Physical Exam   Nursing note and vitals reviewed. Constitutional: He is oriented to person, place, and time. He appears well-developed and well-nourished. No distress.  HENT:  Head: Normocephalic and atraumatic.  Right Ear: Hearing, tympanic membrane, external ear and ear canal normal.  Left Ear: Hearing, tympanic membrane, external ear and ear canal normal.  Nose: Nose normal. No mucosal edema or rhinorrhea.  Mouth/Throat: Uvula is midline, oropharynx is clear and moist and mucous membranes are normal. No oropharyngeal exudate, posterior oropharyngeal edema, posterior oropharyngeal erythema or tonsillar abscesses.  Eyes: Conjunctivae and EOM are normal. Pupils are equal, round, and reactive to light. No scleral icterus.  Neck: Normal range of motion. Neck supple. No thyromegaly present.  Cardiovascular: Normal rate, regular rhythm, normal heart sounds and intact distal pulses.   No murmur heard. Pulses:      Radial pulses are 2+ on the right side, and 2+ on the left side.  Pulmonary/Chest: Effort normal and breath sounds normal. No respiratory distress. He has no wheezes. He has no rales.  Abdominal: Soft. Bowel sounds are normal. He exhibits no distension and no mass. There is no tenderness. There is no rebound and no guarding.  Genitourinary: Prostate normal. Rectal exam shows external hemorrhoid (inferior noninflammed). Rectal exam shows no internal hemorrhoid, no fissure, no mass, no tenderness and anal tone normal. Prostate is not enlarged and not tender.       ~30gm prostate  Musculoskeletal: Normal range of motion.  Lymphadenopathy:    He has no cervical adenopathy.  Neurological: He is alert and oriented to person, place, and time.       CN grossly intact, station and gait intact  Skin: Skin is warm and dry. No rash noted.  Psychiatric: He has a normal mood and affect. His behavior is normal. Judgment and thought content normal.      Assessment & Plan:

## 2011-08-07 NOTE — Assessment & Plan Note (Signed)
Chronic.  Adequate control.  Continue meds.

## 2011-08-07 NOTE — Assessment & Plan Note (Signed)
Reviewed preventative protocols, updated unless pt declined. Discussed healthy eating and living, exercise.

## 2011-08-07 NOTE — Assessment & Plan Note (Signed)
Chronic. Stable on current regimen. Change to generic atorvastatin.

## 2011-08-07 NOTE — Patient Instructions (Addendum)
Blood work reviewed today. Prostate feeling ok. Good to see you.  Return as needed or in 1 year for next physical. Call us with question. Work on [employer]. We will need to keep eye on sugar intake - as you do have prediabetes. We will do generic lipitor - sent in script for this.

## 2011-09-18 ENCOUNTER — Other Ambulatory Visit: Payer: Self-pay | Admitting: Family Medicine

## 2012-06-15 ENCOUNTER — Ambulatory Visit (INDEPENDENT_AMBULATORY_CARE_PROVIDER_SITE_OTHER): Payer: MEDICARE

## 2012-06-15 DIAGNOSIS — Z23 Encounter for immunization: Secondary | ICD-10-CM

## 2012-08-01 ENCOUNTER — Other Ambulatory Visit: Payer: Self-pay | Admitting: Family Medicine

## 2012-08-01 DIAGNOSIS — R7309 Other abnormal glucose: Secondary | ICD-10-CM

## 2012-08-01 DIAGNOSIS — Z125 Encounter for screening for malignant neoplasm of prostate: Secondary | ICD-10-CM

## 2012-08-01 DIAGNOSIS — I10 Essential (primary) hypertension: Secondary | ICD-10-CM

## 2012-08-01 DIAGNOSIS — E785 Hyperlipidemia, unspecified: Secondary | ICD-10-CM

## 2012-08-03 ENCOUNTER — Other Ambulatory Visit (INDEPENDENT_AMBULATORY_CARE_PROVIDER_SITE_OTHER): Payer: MEDICARE

## 2012-08-03 DIAGNOSIS — I10 Essential (primary) hypertension: Secondary | ICD-10-CM

## 2012-08-03 DIAGNOSIS — R7309 Other abnormal glucose: Secondary | ICD-10-CM

## 2012-08-03 DIAGNOSIS — Z125 Encounter for screening for malignant neoplasm of prostate: Secondary | ICD-10-CM

## 2012-08-03 DIAGNOSIS — E785 Hyperlipidemia, unspecified: Secondary | ICD-10-CM

## 2012-08-03 LAB — LIPID PANEL
Cholesterol: 147 mg/dL (ref 0–200)
HDL: 46.2 mg/dL (ref >39.00)
LDL Cholesterol: 84 mg/dL (ref 0–99)
Triglycerides: 82 mg/dL (ref 0.0–149.0)
VLDL: 16.4 mg/dL (ref 0.0–40.0)

## 2012-08-03 LAB — BASIC METABOLIC PANEL
BUN: 19 mg/dL (ref 6–23)
Calcium: 9.8 mg/dL (ref 8.4–10.5)
Creatinine, Ser: 0.9 mg/dL (ref 0.4–1.5)
GFR: 86.5 mL/min (ref >60.00)
Glucose, Bld: 117 mg/dL — ABNORMAL HIGH (ref 70–99)

## 2012-08-03 LAB — PSA: PSA: 3.01 ng/mL (ref 0.10–4.00)

## 2012-08-08 ENCOUNTER — Ambulatory Visit (INDEPENDENT_AMBULATORY_CARE_PROVIDER_SITE_OTHER): Payer: MEDICARE | Admitting: Family Medicine

## 2012-08-08 ENCOUNTER — Encounter: Payer: Self-pay | Admitting: Family Medicine

## 2012-08-08 VITALS — BP 112/78 | HR 72 | Temp 98.5°F | Ht 69.0 in | Wt 201.5 lb

## 2012-08-08 DIAGNOSIS — I10 Essential (primary) hypertension: Secondary | ICD-10-CM

## 2012-08-08 DIAGNOSIS — Z0000 Encounter for general adult medical examination without abnormal findings: Secondary | ICD-10-CM

## 2012-08-08 DIAGNOSIS — Z86010 Personal history of colonic polyps: Secondary | ICD-10-CM

## 2012-08-08 DIAGNOSIS — R7309 Other abnormal glucose: Secondary | ICD-10-CM

## 2012-08-08 DIAGNOSIS — E785 Hyperlipidemia, unspecified: Secondary | ICD-10-CM

## 2012-08-08 NOTE — Assessment & Plan Note (Signed)
Chronic, mild. Continue to monitor with diet and healthy lifestyle choices.

## 2012-08-08 NOTE — Patient Instructions (Addendum)
Good to see you today, call us with questions. Return in 1 year or as needed. Keep watching diet and weight. Advanced directives packet provided today.

## 2012-08-08 NOTE — Assessment & Plan Note (Addendum)
I have personally reviewed the Medicare Annual Wellness questionnaire and have noted 1. The patient's medical and social history 2. Their use of alcohol, tobacco or illicit drugs 3. Their current medications and supplements 4. The patient's functional ability including ADL's, fall risks, home safety risks and hearing or visual impairment. 5. Diet and physical activity 6. Evidence for depression or mood disorders The patients weight, height, BMI have been recorded in the chart.  Hearing and vision has been addressed. I have made referrals, counseling and provided education to the patient based review of the above and I have provided the pt with a written personalized care plan for preventive services. See scanned questionairre. Advanced directives discussed: provided with packet of information, will further discuss next year.  Reviewed preventative protocols and updated unless pt declined. DRE/PSA reassuring.  Due for colonoscopy next year.

## 2012-08-08 NOTE — Progress Notes (Signed)
Subjective:    Patient ID: Blake Peterson, male    DOB: Feb 05, 1941, 71 y.o.   MRN: [id number]  HPI CC: annual exam  HLD - copmliant with atorvastatin. HTN - great control today. prediabetes - glu has been consistently in this range for years, very mild. pt watches diet and walks for 1 hour at gym 6/7 days/wk. Belongs to [facility], weight management program. Lab Results  Component Value Date   HGBA1C 5.9 08/03/2012    Passes vision screen Failed hearing screen on left.  Has seen audiologist who recommended L hearing aide, pt declined.  Last eval was 3 yrs ago.  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  Some trouble at restaurants.  No falls in last year.  Denies anhedonia, depression, sadness.  last cpe last year:  prostate - always normal. nocturia x 1 maybe. strong stream. Would like to continue screening for next few years. Uncle with prostate cancer.  colon - polyps 2009 Dr. Russella Dar, due 2014. no BM changes. + h/o hemorrhoids. watches. flu shot done this year  tetanus shot - 2003  Shingles 2010  Pneumovax 2010 Advanced directives: requests info.    Wt Readings from Last 3 Encounters:  08/08/12 201 lb 8 oz (91.4 kg)  08/07/11 194 lb 12 oz (88.338 kg)  08/11/10 196 lb 8 oz (89.132 kg)    Medications and allergies reviewed and updated in chart.  Past histories reviewed and updated if relevant as below. Patient Active Problem List  Diagnosis  . UNSPECIFIED VITAMIN D DEFICIENCY  . GILBERT'S SYNDROME  . ERECTILE DYSFUNCTION  . CERUMEN IMPACTION, RIGHT  . EXTERNAL HEMORRHOIDS  . PREDIABETES  . ELEVATED PROSTATE SPECIFIC ANTIGEN  . History of colonic polyps  . Diverticulosis of colon  . HTN (hypertension)  . HLD (hyperlipidemia)  . Healthcare maintenance   Past Medical History  Diagnosis Date  . History of colonic polyps   . Diabetes mellitus type II   . Diverticulosis of colon   . HTN (hypertension)   . HLD (hyperlipidemia)    Past Surgical  History  Procedure Date  . Umbilical hernia repair 01/07/04   History  Substance Use Topics  . Smoking status: Never Smoker   . Smokeless tobacco: Never Used  . Alcohol Use: Yes     Comment: occasional   Family History  Problem Relation Age of Onset  . Hypertension Sister   . Hyperlipidemia Sister   . Colon polyps Sister   . Hypertension Sister   . Hyperlipidemia Sister   . Cancer Sister 38    colon-polyp  . Hypertension Sister   . Hyperlipidemia Sister   . Heart attack Father 82   No Known Allergies Current Outpatient Prescriptions on File Prior to Visit  Medication Sig Dispense Refill  . aspirin 81 MG tablet Take 81 mg by mouth daily.        Marland Kitchen atenolol-chlorthalidone (TENORETIC) 50-25 MG per tablet TAKE 1 TABLET BY MOUTH ONCE A DAY  30 tablet  11  . atorvastatin (LIPITOR) 80 MG tablet TAKE 1 TABLET BY MOUTH AT BEDTIME  30 tablet  11  . cholecalciferol (VITAMIN D) 1000 UNITS tablet Take 1,000 Units by mouth daily.        . enalapril (VASOTEC) 10 MG tablet TAKE 1 TABLET BY MOUTH 2 TIMES A DAY  60 tablet  11  . fish oil-omega-3 fatty acids 1000 MG capsule Take 2 g by mouth daily.        Marland Kitchen  sildenafil (VIAGRA) 100 MG tablet Take 100 mg by mouth daily as needed.           Review of Systems  Constitutional: Negative for fever, chills, activity change, appetite change, fatigue and unexpected weight change.  HENT: Negative for hearing loss and neck pain.   Eyes: Negative for visual disturbance.  Respiratory: Negative for cough, chest tightness, shortness of breath and wheezing.   Cardiovascular: Negative for chest pain, palpitations and leg swelling.  Gastrointestinal: Negative for nausea, vomiting, abdominal pain, diarrhea, constipation, blood in stool and abdominal distention.  Genitourinary: Negative for hematuria and difficulty urinating.  Musculoskeletal: Negative for myalgias and arthralgias.  Skin: Negative for rash.  Neurological: Negative for dizziness, seizures,  syncope and headaches.  Hematological: Does not bruise/bleed easily.  Psychiatric/Behavioral: Negative for dysphoric mood. The patient is not nervous/anxious.        Objective:   Physical Exam  Nursing note and vitals reviewed. Constitutional: He is oriented to person, place, and time. He appears well-developed and well-nourished. No distress.  HENT:  Head: Normocephalic and atraumatic.  Right Ear: Hearing, tympanic membrane, external ear and ear canal normal.  Left Ear: Hearing, tympanic membrane, external ear and ear canal normal.  Nose: Nose normal.  Mouth/Throat: Oropharynx is clear and moist. No oropharyngeal exudate.  Eyes: Conjunctivae normal and EOM are normal. Pupils are equal, round, and reactive to light. No scleral icterus.  Neck: Normal range of motion. Neck supple. No thyromegaly present.  Cardiovascular: Normal rate, regular rhythm, normal heart sounds and intact distal pulses.   No murmur heard. Pulses:      Radial pulses are 2+ on the right side, and 2+ on the left side.  Pulmonary/Chest: Effort normal and breath sounds normal. No respiratory distress. He has no wheezes. He has no rales.  Abdominal: Soft. Bowel sounds are normal. He exhibits no distension and no mass. There is no tenderness. There is no rebound and no guarding.  Genitourinary: Rectum normal. Rectal exam shows no external hemorrhoid, no internal hemorrhoid, no fissure, no mass, no tenderness and anal tone normal. Prostate is not enlarged (20gm) and not tender.  Musculoskeletal: Normal range of motion. He exhibits no edema.  Lymphadenopathy:    He has no cervical adenopathy.  Neurological: He is alert and oriented to person, place, and time.       CN grossly intact, station and gait intact  Skin: Skin is warm and dry. No rash noted.  Psychiatric: He has a normal mood and affect. His behavior is normal. Judgment and thought content normal.       Assessment & Plan:

## 2012-08-08 NOTE — Assessment & Plan Note (Signed)
Due for colonoscopy 10/2012.

## 2012-08-08 NOTE — Assessment & Plan Note (Signed)
Chronic, stable. Continue meds. Lab Results  Component Value Date   CHOL 147 08/03/2012   HDL 46.20 08/03/2012   LDLCALC 84 08/03/2012   TRIG 82.0 08/03/2012   CHOLHDL 3 08/03/2012

## 2012-08-08 NOTE — Assessment & Plan Note (Signed)
Chronic, great control on current regimen.  Continue.

## 2012-09-05 ENCOUNTER — Other Ambulatory Visit: Payer: Self-pay | Admitting: Family Medicine

## 2012-09-09 ENCOUNTER — Ambulatory Visit (INDEPENDENT_AMBULATORY_CARE_PROVIDER_SITE_OTHER): Payer: MEDICARE | Admitting: Family Medicine

## 2012-09-09 ENCOUNTER — Encounter: Payer: Self-pay | Admitting: Family Medicine

## 2012-09-09 VITALS — BP 142/80 | HR 72 | Temp 97.8°F | Wt 205.8 lb

## 2012-09-09 DIAGNOSIS — G250 Essential tremor: Secondary | ICD-10-CM | POA: Insufficient documentation

## 2012-09-09 DIAGNOSIS — R259 Unspecified abnormal involuntary movements: Secondary | ICD-10-CM

## 2012-09-09 DIAGNOSIS — R251 Tremor, unspecified: Secondary | ICD-10-CM

## 2012-09-09 NOTE — Assessment & Plan Note (Signed)
Discussed this. 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 Not affecting day to day activities currently. Should not affect driving. Advised to monitor for now, if becoming more bothersome, to let me know to discuss med treatment.   Pt agrees with plan.

## 2012-09-09 NOTE — Progress Notes (Signed)
Subjective:    Patient ID: Blake Peterson, male    DOB: Aug 10, 1941, 71 y.o.   MRN: [id number]  HPI CC: fill out form for DMV  On routine license renewal, hand shaking noted.  Pt states he passed all other DMV tests for renewal.  Has noticed hand shaking for last few years.  Not a constant thing, notices worse when nervous or excited. Dad had hand tremors when he grew older.  Thinks older sister may have tremors as well Not affecting ability to do things he wants to do, not noted with eating.  EtOH - 1 glass of red wine in evenings.  Denies other EtOH use. Denies drug use.  Compliant with meds.  Medications and allergies reviewed and updated in chart.  Past histories reviewed and updated if relevant as below. Patient Active Problem List  Diagnosis  . UNSPECIFIED VITAMIN D DEFICIENCY  . GILBERT'S SYNDROME  . ERECTILE DYSFUNCTION  . CERUMEN IMPACTION, RIGHT  . EXTERNAL HEMORRHOIDS  . PREDIABETES  . ELEVATED PROSTATE SPECIFIC ANTIGEN  . History of colonic polyps  . Diverticulosis of colon  . HTN (hypertension)  . HLD (hyperlipidemia)  . Medicare annual wellness visit, initial   Past Medical History  Diagnosis Date  . History of colonic polyps   . Diabetes mellitus type II   . Diverticulosis of colon   . HTN (hypertension)   . HLD (hyperlipidemia)    Past Surgical History  Procedure Date  . Umbilical hernia repair 01/07/04   History  Substance Use Topics  . Smoking status: Former Games developer  . Smokeless tobacco: Never Used  . Alcohol Use: Yes     Comment: occasional   Family History  Problem Relation Age of Onset  . Hypertension Sister   . Hyperlipidemia Sister   . Colon polyps Sister   . Hypertension Sister   . Hyperlipidemia Sister   . Cancer Sister 23    colon-polyp  . Hypertension Sister   . Hyperlipidemia Sister   . Heart attack Father 23   No Known Allergies Current Outpatient Prescriptions on File Prior to Visit  Medication Sig Dispense Refill  .  aspirin 81 MG tablet Take 81 mg by mouth daily.        Marland Kitchen atenolol-chlorthalidone (TENORETIC) 50-25 MG per tablet TAKE 1 TABLET BY MOUTH ONCE A DAY  30 tablet  11  . atorvastatin (LIPITOR) 80 MG tablet TAKE 1 TABLET BY MOUTH AT BEDTIME  30 tablet  11  . cholecalciferol (VITAMIN D) 1000 UNITS tablet Take 1,000 Units by mouth daily.        . enalapril (VASOTEC) 10 MG tablet TAKE 1 TABLET BY MOUTH 2 TIMES A DAY  60 tablet  11  . fish oil-omega-3 fatty acids 1000 MG capsule Take 2 g by mouth daily.        . sildenafil (VIAGRA) 100 MG tablet Take 100 mg by mouth daily as needed.           Review of Systems Per HPI    Objective:   Physical Exam  Nursing note and vitals reviewed. Constitutional: He is oriented to person, place, and time. He appears well-developed and well-nourished. No distress.  HENT:  Head: Normocephalic and atraumatic.  Mouth/Throat: Oropharynx is clear and moist. No oropharyngeal exudate.  Eyes: Conjunctivae normal and EOM are normal. Pupils are equal, round, and reactive to light. No scleral icterus.  Neck: Normal range of motion. Neck supple.  Cardiovascular: Normal rate, regular rhythm, normal heart sounds and  intact distal pulses.   No murmur heard. Pulmonary/Chest: Effort normal and breath sounds normal. No respiratory distress. He has no wheezes. He has no rales.  Lymphadenopathy:    He has no cervical adenopathy.  Neurological: He is alert and oriented to person, place, and time. He has normal strength. No cranial nerve deficit or sensory deficit. He displays a negative Romberg sign. Coordination and gait normal.       Tremor noted with outstretched hands Somewhat shaky with FTN but overall normal Nl HTS  Skin: Skin is warm and dry. No rash noted.  Psychiatric: He has a normal mood and affect.       Assessment & Plan:

## 2012-09-09 NOTE — Patient Instructions (Signed)
This could be something called essential tremor.  Let's watch it for now.  If becoming more bothersome, let me know.

## 2012-10-10 ENCOUNTER — Encounter: Payer: Self-pay | Admitting: Gastroenterology

## 2012-10-25 ENCOUNTER — Encounter: Payer: Self-pay | Admitting: Gastroenterology

## 2012-11-10 ENCOUNTER — Ambulatory Visit (AMBULATORY_SURGERY_CENTER): Payer: Medicare Other | Admitting: *Deleted

## 2012-11-10 ENCOUNTER — Encounter: Payer: Self-pay | Admitting: Gastroenterology

## 2012-11-10 VITALS — Ht 70.0 in | Wt 207.8 lb

## 2012-11-10 DIAGNOSIS — Z1211 Encounter for screening for malignant neoplasm of colon: Secondary | ICD-10-CM

## 2012-11-10 MED ORDER — NA SULFATE-K SULFATE-MG SULF 17.5-3.13-1.6 GM/177ML PO SOLN: ORAL | Status: DC

## 2012-11-19 HISTORY — PX: COLONOSCOPY: SHX174

## 2012-11-24 ENCOUNTER — Encounter: Payer: Self-pay | Admitting: Gastroenterology

## 2012-11-24 ENCOUNTER — Ambulatory Visit (AMBULATORY_SURGERY_CENTER): Payer: Medicare Other | Admitting: Gastroenterology

## 2012-11-24 VITALS — BP 126/68 | HR 76 | Temp 98.0°F | Resp 17 | Ht 70.0 in | Wt 207.0 lb

## 2012-11-24 DIAGNOSIS — Z860100 Personal history of colon polyps, unspecified: Secondary | ICD-10-CM

## 2012-11-24 DIAGNOSIS — Z1211 Encounter for screening for malignant neoplasm of colon: Secondary | ICD-10-CM

## 2012-11-24 MED ORDER — SODIUM CHLORIDE 0.9 % IV SOLN: 500.0000 mL | INTRAVENOUS | Status: DC

## 2012-11-24 NOTE — Op Note (Signed)
[facility] [address] [place], [address]   COLONOSCOPY PROCEDURE REPORT  PATIENT: Blake, Peterson  MR#: [id number] BIRTHDATE: [birth year] , 72  yrs. old GENDER: Male ENDOSCOPIST: Meryl Dare, MD, [facility] PROCEDURE DATE:  11/24/2012 PROCEDURE:   Colonoscopy, screening ASA CLASS:   Class II INDICATIONS:Patient's personal history of adenomatous colon polyps.  MEDICATIONS: MAC sedation, administered by CRNA and propofol (Diprivan) 100mg  IV DESCRIPTION OF PROCEDURE:   After the risks benefits and alternatives of the procedure were thoroughly explained, informed consent was obtained.  A digital rectal exam revealed no abnormalities of the rectum.   The LB [id number]  endoscope was introduced through the anus and advanced to the cecum, which was identified by both the appendix and ileocecal valve. No adverse events experienced.   The quality of the prep was good, using MoviPrep  The instrument was then slowly withdrawn as the colon was fully examined.  COLON FINDINGS: Mild diverticulosis was noted in the transverse colon.   Moderate diverticulosis was noted in the sigmoid colon and descending colon.   The colon was otherwise normal.  There was no diverticulosis, inflammation, polyps or cancers unless previously stated.  Retroflexed views revealed moderate internal hemorrhoids. The time to cecum=1 minutes 10 seconds.  Withdrawal time=8 minutes 29 seconds.  The scope was withdrawn and the procedure completed.  COMPLICATIONS: There were no complications.  ENDOSCOPIC IMPRESSION: 1.   Mild diverticulosis was noted in the transverse colon 2.   Moderate diverticulosis was noted in the sigmoid colon and descending colon 3.   Moderate internal hemorrhoids  RECOMMENDATIONS: 1.  High fiber diet with liberal fluid intake. 2.  Repeat Colonoscopy in 5 years.  eSigned:  Meryl Dare, MD, [facility] 11/24/2012 9:56 AM

## 2012-11-24 NOTE — Patient Instructions (Addendum)
Impressions/recommendations:  Diverticulosis (handout given) Hemorrhoids (handout given)  High Fiber diet with liberal fluid intake. Repeat colonoscopy in 5 years.  YOU HAD AN ENDOSCOPIC PROCEDURE TODAY AT THE [facility]: Refer to the procedure report that was given to you for any specific questions about what was found during the examination.  If the procedure report does not answer your questions, please call your gastroenterologist to clarify.  If you requested that your care partner not be given the details of your procedure findings, then the procedure report has been included in a sealed envelope for you to review at your convenience later.  YOU SHOULD EXPECT: Some feelings of bloating in the abdomen. Passage of more gas than usual.  Walking can help get rid of the air that was put into your GI tract during the procedure and reduce the bloating. If you had a lower endoscopy (such as a colonoscopy or flexible sigmoidoscopy) you may notice spotting of blood in your stool or on the toilet paper. If you underwent a bowel prep for your procedure, then you may not have a normal bowel movement for a few days.  DIET: Your first meal following the procedure should be a light meal and then it is ok to progress to your normal diet.  A half-sandwich or bowl of soup is an example of a good first meal.  Heavy or fried foods are harder to digest and may make you feel nauseous or bloated.  Likewise meals heavy in dairy and vegetables can cause extra gas to form and this can also increase the bloating.  Drink plenty of fluids but you should avoid alcoholic beverages for 24 hours.  ACTIVITY: Your care partner should take you home directly after the procedure.  You should plan to take it easy, moving slowly for the rest of the day.  You can resume normal activity the day after the procedure however you should NOT DRIVE or use heavy machinery for 24 hours (because of the sedation medicines used during  the test).    SYMPTOMS TO REPORT IMMEDIATELY: A gastroenterologist can be reached at any hour.  During normal business hours, 8:30 AM to 5:00 PM Monday through Friday, call [phone].  After hours and on weekends, please call the GI answering service at [phone] who will take a message and have the physician on call contact you.   Following lower endoscopy (colonoscopy or flexible sigmoidoscopy):  Excessive amounts of blood in the stool  Significant tenderness or worsening of abdominal pains  Swelling of the abdomen that is new, acute  Fever of 100F or higher   FOLLOW UP: If any biopsies were taken you will be contacted by phone or by letter within the next 1-3 weeks.  Call your gastroenterologist if you have not heard about the biopsies in 3 weeks.  Our staff will call the home number listed on your records the next business day following your procedure to check on you and address any questions or concerns that you may have at that time regarding the information given to you following your procedure. This is a courtesy call and so if there is no answer at the home number and we have not heard from you through the emergency physician on call, we will assume that you have returned to your regular daily activities without incident.  SIGNATURES/CONFIDENTIALITY: You and/or your care partner have signed paperwork which will be entered into your electronic medical record.  These signatures attest to the fact that  that the information above on your After Visit Summary has been reviewed and is understood.  Full responsibility of the confidentiality of this discharge information lies with you and/or your care-partner.

## 2012-11-24 NOTE — Progress Notes (Signed)
Patient did not experience any of the following events: a burn prior to discharge; a fall within the facility; wrong site/side/patient/procedure/implant event; or a hospital transfer or hospital admission upon discharge from the facility. (G8907) Patient did not have preoperative order for IV antibiotic SSI prophylaxis. (G8918)  

## 2012-11-24 NOTE — Progress Notes (Signed)
Stable to RR 

## 2012-11-25 ENCOUNTER — Telehealth: Payer: Self-pay | Admitting: *Deleted

## 2012-11-25 NOTE — Telephone Encounter (Signed)
No answer, no message left

## 2012-11-28 ENCOUNTER — Encounter: Payer: Self-pay | Admitting: Family Medicine

## 2013-06-13 ENCOUNTER — Ambulatory Visit (INDEPENDENT_AMBULATORY_CARE_PROVIDER_SITE_OTHER): Payer: Medicare Other

## 2013-06-13 DIAGNOSIS — Z23 Encounter for immunization: Secondary | ICD-10-CM

## 2013-07-30 ENCOUNTER — Other Ambulatory Visit: Payer: Self-pay | Admitting: Family Medicine

## 2013-07-30 DIAGNOSIS — R7309 Other abnormal glucose: Secondary | ICD-10-CM

## 2013-07-30 DIAGNOSIS — R972 Elevated prostate specific antigen [PSA]: Secondary | ICD-10-CM

## 2013-07-30 DIAGNOSIS — E559 Vitamin D deficiency, unspecified: Secondary | ICD-10-CM

## 2013-07-30 DIAGNOSIS — I10 Essential (primary) hypertension: Secondary | ICD-10-CM

## 2013-07-30 DIAGNOSIS — E785 Hyperlipidemia, unspecified: Secondary | ICD-10-CM

## 2013-08-01 ENCOUNTER — Other Ambulatory Visit (INDEPENDENT_AMBULATORY_CARE_PROVIDER_SITE_OTHER): Payer: Medicare Other

## 2013-08-01 DIAGNOSIS — E559 Vitamin D deficiency, unspecified: Secondary | ICD-10-CM

## 2013-08-01 DIAGNOSIS — R972 Elevated prostate specific antigen [PSA]: Secondary | ICD-10-CM

## 2013-08-01 DIAGNOSIS — E785 Hyperlipidemia, unspecified: Secondary | ICD-10-CM

## 2013-08-01 DIAGNOSIS — R7309 Other abnormal glucose: Secondary | ICD-10-CM

## 2013-08-01 DIAGNOSIS — I10 Essential (primary) hypertension: Secondary | ICD-10-CM

## 2013-08-01 LAB — LIPID PANEL
Cholesterol: 160 mg/dL (ref 0–200)
HDL: 49.3 mg/dL (ref >39.00)
VLDL: 21 mg/dL (ref 0.0–40.0)

## 2013-08-01 LAB — BASIC METABOLIC PANEL
Chloride: 95 mEq/L — ABNORMAL LOW (ref 96–112)
Creatinine, Ser: 0.8 mg/dL (ref 0.4–1.5)
GFR: 98.5 mL/min (ref >60.00)
Potassium: 3.9 mEq/L (ref 3.5–5.1)
Sodium: 133 mEq/L — ABNORMAL LOW (ref 135–145)

## 2013-08-01 LAB — HEMOGLOBIN A1C: Hgb A1c MFr Bld: 6.1 % (ref 4.6–6.5)

## 2013-08-01 LAB — PSA: PSA: 3.31 ng/mL (ref 0.10–4.00)

## 2013-08-02 LAB — VITAMIN D 25 HYDROXY (VIT D DEFICIENCY, FRACTURES): Vit D, 25-Hydroxy: 35 ng/mL (ref 30–89)

## 2013-08-08 ENCOUNTER — Ambulatory Visit (INDEPENDENT_AMBULATORY_CARE_PROVIDER_SITE_OTHER): Payer: Medicare Other | Admitting: Family Medicine

## 2013-08-08 ENCOUNTER — Encounter: Payer: Self-pay | Admitting: Family Medicine

## 2013-08-08 VITALS — BP 136/92 | HR 72 | Temp 97.6°F | Ht 69.0 in | Wt 203.0 lb

## 2013-08-08 DIAGNOSIS — I10 Essential (primary) hypertension: Secondary | ICD-10-CM

## 2013-08-08 DIAGNOSIS — H9190 Unspecified hearing loss, unspecified ear: Secondary | ICD-10-CM

## 2013-08-08 DIAGNOSIS — E785 Hyperlipidemia, unspecified: Secondary | ICD-10-CM

## 2013-08-08 DIAGNOSIS — R7309 Other abnormal glucose: Secondary | ICD-10-CM

## 2013-08-08 DIAGNOSIS — Z0000 Encounter for general adult medical examination without abnormal findings: Secondary | ICD-10-CM

## 2013-08-08 DIAGNOSIS — H9192 Unspecified hearing loss, left ear: Secondary | ICD-10-CM

## 2013-08-08 DIAGNOSIS — Z23 Encounter for immunization: Secondary | ICD-10-CM

## 2013-08-08 NOTE — Patient Instructions (Addendum)
Tetanus shot today. Good to see you , call us with questions. Return as needed or in 1 year for next wellness visit. I think you are doing well.

## 2013-08-08 NOTE — Assessment & Plan Note (Signed)
Chronic, great control - continue lipitor 80mg  daily.

## 2013-08-08 NOTE — Progress Notes (Signed)
Pre-visit discussion using our clinic review tool. No additional management support is needed unless otherwise documented below in the visit note.  

## 2013-08-08 NOTE — Addendum Note (Signed)
Addended by: Josph Macho A on: 08/08/2013 09:25 AM   Modules accepted: Orders

## 2013-08-08 NOTE — Assessment & Plan Note (Signed)
Chronic, stable. Continue meds. 

## 2013-08-08 NOTE — Assessment & Plan Note (Signed)
Chronic, stable 

## 2013-08-08 NOTE — Progress Notes (Signed)
Subjective:    Patient ID: Blake Peterson, male    DOB: 03-Feb-1941, 72 y.o.   MRN: [id number]  HPI CC: Medicare wellness visit  Blake Peterson presents today for medicare wellness visit - subsequent.  HTN - tolerating and compliant with tenoretic and ACEI HLD - lipitor without myalgias. Prediabetes - exercises regularly at gym - walking and upper body.  Passes vision screen. Failed hearing screen on left. Has seen audiologist who recommended L hearing aide, pt declined. Last eval was 4 yrs ago. 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 Some trouble hearing at restaurants. Currently would like re evaluation with audiology - requests referral today No falls in last year. Denies anhedonia, depression, sadness.  Preventative: Prostate - always normal. nocturia x 1 maybe. strong stream. Would like to continue screening for next few years. Uncle with prostate cancer. Colon cancer screening - 11/2012 diverticulosis and hemorrhoids, no polyps , rec rpt 5 yrs Russella Dar). flu shot done this year  tetanus shot - 2003 - would like updated. Pneumovax 2010 Shingles 2010 Advanced directives: has not completed yet. Would want wife to be HCPOA.  Medications and allergies reviewed and updated in chart.  Past histories reviewed and updated if relevant as below. Patient Active Problem List   Diagnosis Date Noted  . Tremor 09/09/2012  . Medicare annual wellness visit, initial 08/07/2011  . History of colonic polyps   . Diverticulosis of colon   . HTN (hypertension)   . HLD (hyperlipidemia)   . PREDIABETES 08/11/2010  . 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 CERUMEN IMPACTION, RIGHT 06/19/2010  . 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 EXTERNAL HEMORRHOIDS 02/16/2007  . ELEVATED PROSTATE SPECIFIC ANTIGEN 02/16/2007   Past Medical History  Diagnosis Date  . History of colonic polyps   . Prediabetes   . Diverticulosis of colon   . HTN  (hypertension)   . HLD (hyperlipidemia)    Past Surgical History  Procedure Laterality Date  . Umbilical hernia repair  01/07/04  . Colonoscopy  11/2012    mild-mod diverticulosis, int hem, rec rpt 5 yrs Russella Dar)   History  Substance Use Topics  . Smoking status: Former Smoker    Quit date: 11/10/1984  . Smokeless tobacco: Never Used  . Alcohol Use: Yes     Comment: occasional   Family History  Problem Relation Age of Onset  . Hypertension Sister   . Colon cancer Sister 16  . Hyperlipidemia Sister   . Colon polyps Sister   . Hypertension Sister   . Hyperlipidemia Sister   . Cancer Sister 10    colon-polyp  . Hypertension Sister   . Hyperlipidemia Sister   . Heart attack Father 74   No Known Allergies Current Outpatient Prescriptions on File Prior to Visit  Medication Sig Dispense Refill  . aspirin 81 MG tablet Take 81 mg by mouth daily.        Marland Kitchen atenolol-chlorthalidone (TENORETIC) 50-25 MG per tablet TAKE 1 TABLET BY MOUTH ONCE A DAY  30 tablet  11  . atorvastatin (LIPITOR) 80 MG tablet TAKE 1 TABLET BY MOUTH AT BEDTIME  30 tablet  11  . cholecalciferol (VITAMIN D) 1000 UNITS tablet Take 1,000 Units by mouth daily.        . enalapril (VASOTEC) 10 MG tablet TAKE 1 TABLET BY MOUTH 2 TIMES A DAY  60 tablet  11  . fish oil-omega-3 fatty acids 1000 MG capsule Take 2 g by mouth daily.        Marland Kitchen  sildenafil (VIAGRA) 100 MG tablet Take 100 mg by mouth daily as needed.         No current facility-administered medications on file prior to visit.    Review of Systems  Constitutional: Negative for fever, chills, activity change, appetite change, fatigue and unexpected weight change.  HENT: Negative for hearing loss.   Eyes: Negative for visual disturbance.  Respiratory: Negative for cough, chest tightness, shortness of breath and wheezing.   Cardiovascular: Negative for chest pain, palpitations and leg swelling.  Gastrointestinal: Positive for constipation. Negative for nausea,  vomiting, abdominal pain, diarrhea, blood in stool and abdominal distention.  Genitourinary: Negative for hematuria and difficulty urinating.  Musculoskeletal: Negative for arthralgias, myalgias and neck pain.  Skin: Negative for rash.  Neurological: Negative for dizziness, seizures, syncope and headaches.  Hematological: Negative for adenopathy. Does not bruise/bleed easily.  Psychiatric/Behavioral: Negative for dysphoric mood. The patient is not nervous/anxious.        Objective:   Physical Exam  Nursing note and vitals reviewed. Constitutional: He is oriented to person, place, and time. He appears well-developed and well-nourished. No distress.  HENT:  Head: Normocephalic and atraumatic.  Right Ear: Hearing, tympanic membrane, external ear and ear canal normal.  Left Ear: Hearing, tympanic membrane, external ear and ear canal normal.  Nose: Nose normal.  Mouth/Throat: Oropharynx is clear and moist. No oropharyngeal exudate.  Eyes: Conjunctivae and EOM are normal. Pupils are equal, round, and reactive to light. No scleral icterus.  Neck: Normal range of motion. Neck supple. Carotid bruit is not present. No thyromegaly present.  Cardiovascular: Normal rate, regular rhythm, normal heart sounds and intact distal pulses.   No murmur heard. Pulses:      Radial pulses are 2+ on the right side, and 2+ on the left side.  Pulmonary/Chest: Effort normal and breath sounds normal. No respiratory distress. He has no wheezes. He has no rales.  Abdominal: Soft. Bowel sounds are normal. He exhibits no distension and no mass. There is no tenderness. There is no rebound and no guarding.  Genitourinary: Rectum normal and prostate normal. Rectal exam shows no external hemorrhoid, no internal hemorrhoid, no fissure, no mass, no tenderness and anal tone normal. Prostate is not enlarged (20gm) and not tender.  Musculoskeletal: Normal range of motion. He exhibits no edema.  Lymphadenopathy:    He has no  cervical adenopathy.  Neurological: He is alert and oriented to person, place, and time.  CN grossly intact, station and gait intact  Skin: Skin is warm and dry. No rash noted.  Psychiatric: He has a normal mood and affect. His behavior is normal. Judgment and thought content normal.       Assessment & Plan:

## 2013-08-08 NOTE — Assessment & Plan Note (Signed)
I have personally reviewed the Medicare Annual Wellness questionnaire and have noted 1. The patient's medical and social history 2. Their use of alcohol, tobacco or illicit drugs 3. Their current medications and supplements 4. The patient's functional ability including ADL's, fall risks, home safety risks and hearing or visual impairment. 5. Diet and physical activity 6. Evidence for depression or mood disorders The patients weight, height, BMI have been recorded in the chart.  Hearing and vision has been addressed. I have made referrals, counseling and provided education to the patient based review of the above and I have provided the pt with a written personalized care plan for preventive services. See scanned questionairre. Advanced directives discussed: would want wife to be HCPOA.    Reviewed preventative protocols and updated unless pt declined.  Td today. DRE/PSA WNL.

## 2013-08-17 ENCOUNTER — Other Ambulatory Visit: Payer: Self-pay | Admitting: Family Medicine

## 2013-11-14 ENCOUNTER — Other Ambulatory Visit: Payer: Self-pay | Admitting: *Deleted

## 2013-11-14 MED ORDER — ENALAPRIL MALEATE 10 MG PO TABS: ORAL_TABLET | ORAL | Status: DC

## 2013-11-14 MED ORDER — ATENOLOL-CHLORTHALIDONE 50-25 MG PO TABS: ORAL_TABLET | ORAL | Status: DC

## 2013-11-14 MED ORDER — ATORVASTATIN CALCIUM 80 MG PO TABS: ORAL_TABLET | ORAL | Status: DC

## 2013-11-15 ENCOUNTER — Other Ambulatory Visit: Payer: Self-pay | Admitting: *Deleted

## 2013-11-15 MED ORDER — ATORVASTATIN CALCIUM 80 MG PO TABS: ORAL_TABLET | ORAL | Status: DC

## 2013-11-15 MED ORDER — ATENOLOL-CHLORTHALIDONE 50-25 MG PO TABS: ORAL_TABLET | ORAL | Status: DC

## 2013-11-15 MED ORDER — ENALAPRIL MALEATE 10 MG PO TABS: ORAL_TABLET | ORAL | Status: DC

## 2014-06-20 ENCOUNTER — Ambulatory Visit (INDEPENDENT_AMBULATORY_CARE_PROVIDER_SITE_OTHER): Payer: Medicare Other

## 2014-06-20 DIAGNOSIS — Z23 Encounter for immunization: Secondary | ICD-10-CM

## 2014-08-03 ENCOUNTER — Other Ambulatory Visit: Payer: Self-pay | Admitting: Family Medicine

## 2014-08-03 ENCOUNTER — Other Ambulatory Visit (INDEPENDENT_AMBULATORY_CARE_PROVIDER_SITE_OTHER): Payer: Medicare Other

## 2014-08-03 DIAGNOSIS — R972 Elevated prostate specific antigen [PSA]: Secondary | ICD-10-CM

## 2014-08-03 DIAGNOSIS — E785 Hyperlipidemia, unspecified: Secondary | ICD-10-CM

## 2014-08-03 DIAGNOSIS — R7309 Other abnormal glucose: Secondary | ICD-10-CM

## 2014-08-03 DIAGNOSIS — R7303 Prediabetes: Secondary | ICD-10-CM

## 2014-08-03 DIAGNOSIS — I10 Essential (primary) hypertension: Secondary | ICD-10-CM

## 2014-08-03 LAB — COMPREHENSIVE METABOLIC PANEL
ALK PHOS: 68 U/L (ref 39–117)
ALT: 43 U/L (ref 0–53)
AST: 31 U/L (ref 0–37)
Albumin: 3.7 g/dL (ref 3.5–5.2)
BUN: 18 mg/dL (ref 6–23)
CALCIUM: 10.3 mg/dL (ref 8.4–10.5)
CHLORIDE: 96 meq/L (ref 96–112)
CO2: 23 mEq/L (ref 19–32)
CREATININE: 0.9 mg/dL (ref 0.4–1.5)
GFR: 89.36 mL/min (ref >60.00)
Glucose, Bld: 117 mg/dL — ABNORMAL HIGH (ref 70–99)
Potassium: 4.1 mEq/L (ref 3.5–5.1)
Sodium: 136 mEq/L (ref 135–145)
Total Bilirubin: 1.2 mg/dL (ref 0.2–1.2)
Total Protein: 7.4 g/dL (ref 6.0–8.3)

## 2014-08-03 LAB — LIPID PANEL
CHOL/HDL RATIO: 4
Cholesterol: 154 mg/dL (ref 0–200)
HDL: 38.8 mg/dL — AB (ref >39.00)
LDL CALC: 96 mg/dL (ref 0–99)
NONHDL: 115.2
TRIGLYCERIDES: 98 mg/dL (ref 0.0–149.0)
VLDL: 19.6 mg/dL (ref 0.0–40.0)

## 2014-08-03 LAB — PSA: PSA: 3.32 ng/mL (ref 0.10–4.00)

## 2014-08-03 LAB — HEMOGLOBIN A1C: Hgb A1c MFr Bld: 6.2 % (ref 4.6–6.5)

## 2014-08-08 ENCOUNTER — Telehealth: Payer: Self-pay | Admitting: Family Medicine

## 2014-08-08 NOTE — Telephone Encounter (Signed)
emmi mailed  °

## 2014-08-10 ENCOUNTER — Ambulatory Visit (INDEPENDENT_AMBULATORY_CARE_PROVIDER_SITE_OTHER): Payer: Medicare Other | Admitting: Family Medicine

## 2014-08-10 ENCOUNTER — Encounter (INDEPENDENT_AMBULATORY_CARE_PROVIDER_SITE_OTHER): Payer: Self-pay

## 2014-08-10 ENCOUNTER — Encounter: Payer: Self-pay | Admitting: Family Medicine

## 2014-08-10 VITALS — BP 140/72 | HR 66 | Temp 97.9°F | Ht 68.0 in | Wt 204.0 lb

## 2014-08-10 DIAGNOSIS — H6123 Impacted cerumen, bilateral: Secondary | ICD-10-CM | POA: Insufficient documentation

## 2014-08-10 DIAGNOSIS — Z0000 Encounter for general adult medical examination without abnormal findings: Secondary | ICD-10-CM

## 2014-08-10 DIAGNOSIS — E785 Hyperlipidemia, unspecified: Secondary | ICD-10-CM

## 2014-08-10 DIAGNOSIS — Z7189 Other specified counseling: Secondary | ICD-10-CM | POA: Insufficient documentation

## 2014-08-10 DIAGNOSIS — Z23 Encounter for immunization: Secondary | ICD-10-CM

## 2014-08-10 DIAGNOSIS — R7303 Prediabetes: Secondary | ICD-10-CM

## 2014-08-10 DIAGNOSIS — I10 Essential (primary) hypertension: Secondary | ICD-10-CM

## 2014-08-10 MED ORDER — ATENOLOL-CHLORTHALIDONE 50-25 MG PO TABS: ORAL_TABLET | ORAL | Status: DC

## 2014-08-10 MED ORDER — ATORVASTATIN CALCIUM 80 MG PO TABS: ORAL_TABLET | ORAL | Status: DC

## 2014-08-10 MED ORDER — ENALAPRIL MALEATE 10 MG PO TABS: ORAL_TABLET | ORAL | Status: DC

## 2014-08-10 NOTE — Assessment & Plan Note (Signed)

## 2014-08-10 NOTE — Assessment & Plan Note (Signed)
Chronic, stable continue lipitor.

## 2014-08-10 NOTE — Patient Instructions (Addendum)
prevnar today. Advanced directive packet provided today. For ears - use dilute hydrogen peroxide a few times a month (equal parts with water). Good to see you today, call us with questions. Return as needed or in 1 year for next wellness visit.

## 2014-08-10 NOTE — Addendum Note (Signed)
Addended by: Lurlean Nanny on: 08/10/2014 09:45 AM   Modules accepted: Orders

## 2014-08-10 NOTE — Assessment & Plan Note (Signed)
Chronic, stable. Continue regimen. 

## 2014-08-10 NOTE — Assessment & Plan Note (Signed)
Disimpaction performed in office. Pt tolerated well. Discussed dilute H2O2 use.

## 2014-08-10 NOTE — Assessment & Plan Note (Signed)
Advanced directives: wants to hire lawyer. Would want wife to be HCPOA.

## 2014-08-10 NOTE — Progress Notes (Signed)
Pre visit review using our clinic review tool, if applicable. No additional management support is needed unless otherwise documented below in the visit note. 

## 2014-08-10 NOTE — Assessment & Plan Note (Signed)
Preventative protocols reviewed and updated unless pt declined. Discussed healthy diet and lifestyle.  

## 2014-08-10 NOTE — Progress Notes (Signed)
BP 140/72 mmHg  Pulse 66  Temp(Src) 97.9 F (36.6 C) (Oral)  Ht 5\' 8"  (1.727 m)  Wt 204 lb (92.534 kg)  BMI 31.03 kg/m2  SpO2 99%   CC: medicare wellness visit Subjective:    Patient ID: Blake Peterson, male    DOB: 12-13-1940, 73 y.o.   MRN: [id number]  HPI: Blake Peterson is a 73 y.o. male presenting on 08/10/2014 for Annual Exam   HTN - tolerating and compliant with tenoretic and ACEI HLD - lipitor without myalgias. Prediabetes - exercises regularly at gym - walking and upper body.  Passes vision screen. 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 ([place]) No falls in last year. Denies anhedonia, depression, sadness.  Preventative: Prostate - always normal. nocturia x 1 maybe. strong stream. Would like to continue screening for next few years. Uncle with prostate cancer. Colonoscopy - 11/2012 diverticulosis and hemorrhoids, no polyps , rec rpt 5 yrs [facility]). flu shot 06/20/2014 tetanus shot - 2014 Pneumovax 2010, prevnar 2015 Shingles 2010 Advanced directives: wants to hire lawyer. Would want wife to be HCPOA.  Relevant past medical, surgical, family and social history reviewed and updated as indicated.  Allergies and medications reviewed and updated. Current Outpatient Prescriptions on File Prior to Visit  Medication Sig  . aspirin 81 MG tablet Take 81 mg by mouth daily.    . cholecalciferol (VITAMIN D) 1000 UNITS tablet Take 1,000 Units by mouth daily.    . fish oil-omega-3 fatty acids 1000 MG capsule Take 2 g by mouth daily.    . sildenafil (VIAGRA) 100 MG tablet Take 100 mg by mouth daily as needed.     No current facility-administered medications on file prior to visit.    Review of Systems  Constitutional: Negative for fever, chills, activity change, appetite change, fatigue and unexpected weight change.  HENT: Negative for hearing loss.   Eyes: Negative for visual disturbance.  Respiratory: Negative for cough,  chest tightness, shortness of breath and wheezing.   Cardiovascular: Negative for chest pain, palpitations and leg swelling.  Gastrointestinal: Negative for nausea, vomiting, abdominal pain, diarrhea, constipation, blood in stool and abdominal distention.  Genitourinary: Negative for hematuria and difficulty urinating.  Musculoskeletal: Negative for myalgias, arthralgias and neck pain.  Skin: Negative for rash.  Neurological: Negative for dizziness, seizures, syncope and headaches.  Hematological: Negative for adenopathy. Does not bruise/bleed easily.  Psychiatric/Behavioral: Negative for dysphoric mood. The patient is not nervous/anxious.    Per HPI unless specifically indicated above    Objective:    BP 140/72 mmHg  Pulse 66  Temp(Src) 97.9 F (36.6 C) (Oral)  Ht 5\' 8"  (1.727 m)  Wt 204 lb (92.534 kg)  BMI 31.03 kg/m2  SpO2 99%  Physical Exam  Constitutional: He is oriented to person, place, and time. He appears well-developed and well-nourished. No distress.  HENT:  Head: Normocephalic and atraumatic.  Right Ear: Hearing, tympanic membrane, external ear and ear canal normal.  Left Ear: Hearing, tympanic membrane, external ear and ear canal normal.  Nose: Nose normal.  Mouth/Throat: Uvula is midline, oropharynx is clear and moist and mucous membranes are normal. No oropharyngeal exudate, posterior oropharyngeal edema or posterior oropharyngeal erythema.  Bilateral cerumen impaction affecting hearing cleaned with plastic curette. Pt tolerated well.  Eyes: Conjunctivae and EOM are normal. Pupils are equal, round, and reactive to light. No scleral icterus.  Neck: Normal range of motion. Neck supple. Carotid bruit is not present. No  thyromegaly present.  Cardiovascular: Normal rate, regular rhythm, normal heart sounds and intact distal pulses.   No murmur heard. Pulses:      Radial pulses are 2+ on the right side, and 2+ on the left side.  Pulmonary/Chest: Effort normal and breath  sounds normal. No respiratory distress. He has no wheezes. He has no rales.  Abdominal: Soft. Bowel sounds are normal. He exhibits no distension and no mass. There is no tenderness. There is no rebound and no guarding.  Genitourinary: Prostate normal. Rectal exam shows external hemorrhoid (noninflamed). Rectal exam shows no internal hemorrhoid, no fissure, no mass, no tenderness and anal tone normal. Prostate is not enlarged (20gm) and not tender.  Musculoskeletal: Normal range of motion. He exhibits no edema.  Lymphadenopathy:    He has no cervical adenopathy.  Neurological: He is alert and oriented to person, place, and time.  CN grossly intact, station and gait intact Recall 2/3, 3/3 with cue Calculation 3/5 D-L-O-R-W, 1/5 serial 3s  Skin: Skin is warm and dry. No rash noted.  Psychiatric: He has a normal mood and affect. His behavior is normal. Judgment and thought content normal.  Nursing note and vitals reviewed.  Results for orders placed or performed in visit on 08/03/14  Lipid panel  Result Value Ref Range   Cholesterol 154 0 - 200 mg/dL   Triglycerides 98.0 0.0 - 149.0 mg/dL   HDL 38.80 (L) >39.00 mg/dL   VLDL 19.6 0.0 - 40.0 mg/dL   LDL Cholesterol 96 0 - 99 mg/dL   Total CHOL/HDL Ratio 4    NonHDL 115.20   Comprehensive metabolic panel  Result Value Ref Range   Sodium 136 135 - 145 mEq/L   Potassium 4.1 3.5 - 5.1 mEq/L   Chloride 96 96 - 112 mEq/L   CO2 23 19 - 32 mEq/L   Glucose, Bld 117 (H) 70 - 99 mg/dL   BUN 18 6 - 23 mg/dL   Creatinine, Ser 0.9 0.4 - 1.5 mg/dL   Total Bilirubin 1.2 0.2 - 1.2 mg/dL   Alkaline Phosphatase 68 39 - 117 U/L   AST 31 0 - 37 U/L   ALT 43 0 - 53 U/L   Total Protein 7.4 6.0 - 8.3 g/dL   Albumin 3.7 3.5 - 5.2 g/dL   Calcium 10.3 8.4 - 10.5 mg/dL   GFR 89.36 >60.00 mL/min  Hemoglobin A1c  Result Value Ref Range   Hgb A1c MFr Bld 6.2 4.6 - 6.5 %  PSA  Result Value Ref Range   PSA 3.32 0.10 - 4.00 ng/mL      Assessment & Plan:    Problem List Items Addressed This Visit    Prediabetes    Reviewed #s with patient.    Medicare annual wellness visit, subsequent - Primary    I have personally reviewed the Medicare Annual Wellness questionnaire and have noted 1. The patient's medical and social history 2. Their use of alcohol, tobacco or illicit drugs 3. Their current medications and supplements 4. The patient's functional ability including ADL's, fall risks, home safety risks and hearing or visual impairment. 5. Diet and physical activity 6. Evidence for depression or mood disorders The patients weight, height, BMI have been recorded in the chart.  Hearing and vision has been addressed. I have made referrals, counseling and provided education to the patient based review of the above and I have provided the pt with a written personalized care plan for preventive services. Provider list updated - see  scanned questionairre.  Reviewed preventative protocols and updated unless pt declined.    HTN (hypertension)    Chronic, stable. Continue regimen.    Relevant Medications      atenolol-chlorthalidone (TENORETIC) 50-25 MG per tablet      atorvastatin (LIPITOR) tablet      enalapril (VASOTEC) tablet   HLD (hyperlipidemia)    Chronic, stable continue lipitor.    Relevant Medications      atenolol-chlorthalidone (TENORETIC) 50-25 MG per tablet      atorvastatin (LIPITOR) tablet      enalapril (VASOTEC) tablet   Health maintenance examination    Preventative protocols reviewed and updated unless pt declined. Discussed healthy diet and lifestyle.    Bilateral hearing loss due to cerumen impaction    Disimpaction performed in office. Pt tolerated well. Discussed dilute H2O2 use.    Advanced care planning/counseling discussion    Advanced directives: wants to hire lawyer. Would want wife to be HCPOA.        Follow up plan: Return in about 1 year (around 08/11/2015), or as needed, for medicare wellness.

## 2014-08-10 NOTE — Assessment & Plan Note (Signed)
Reviewed #s with patient. 

## 2014-10-29 ENCOUNTER — Telehealth: Payer: Self-pay

## 2014-10-29 NOTE — Telephone Encounter (Signed)
Mrs Wilford left v/m that pt received letter from [facility] that atenolol chlorthalidone is no longer covered; spoke with Mrs Groh and she will contact ins co to get names of approved meds that could be substituted for atenolol chlorthalidone and call [place] back with info.

## 2014-10-31 NOTE — Telephone Encounter (Signed)
Form in your IN box for completion.

## 2014-10-31 NOTE — Telephone Encounter (Signed)
Mrs Cansler called back and was told by [facility] for PCP to put in request for exemption to ins co. Advised to go on line to get form or request faxed form from [facility]. Contact # is [phone].

## 2014-11-01 NOTE — Telephone Encounter (Signed)
PA filled an placed in Kim's box.

## 2014-11-02 NOTE — Telephone Encounter (Signed)
Late entry- form faxed 11/01/14. Will await determination.

## 2014-11-08 ENCOUNTER — Telehealth: Payer: Self-pay | Admitting: Family Medicine

## 2014-11-08 NOTE — Telephone Encounter (Signed)
Pt dropped letters (2) off that were sent to Dr Darnell Level, but addressed from [facility].  They are regarding medications no longer being covered, but pt stated that Dr Darnell Level is already aware of that. Placing on Kim's Desk.

## 2014-11-09 MED ORDER — ATENOLOL 50 MG PO TABS: 50.0000 mg | ORAL_TABLET | Freq: Every day | ORAL | Status: DC

## 2014-11-09 MED ORDER — CHLORTHALIDONE 25 MG PO TABS: 25.0000 mg | ORAL_TABLET | Freq: Every day | ORAL | Status: DC

## 2014-11-09 NOTE — Telephone Encounter (Signed)
Letter was a PA denial. See other phone note.

## 2014-11-09 NOTE — Telephone Encounter (Addendum)
PA denied. Will require separate meds instead of the combo. Spoke with patient and he is willing to take separate pills. [facility]. Letter in your IN box for review.

## 2014-11-09 NOTE — Telephone Encounter (Signed)
Sent in separate meds.

## 2014-11-09 NOTE — Addendum Note (Signed)
Addended by: Ria Bush on: 11/09/2014 03:42 PM   Modules accepted: Orders, Medications

## 2014-11-15 ENCOUNTER — Telehealth: Payer: Self-pay

## 2014-11-15 NOTE — Telephone Encounter (Signed)
Blake Peterson left v/m wanting to know if could redo recent refills for monthly rx instead of 90 day rx. Spoke with Blake Peterson at [employer] and was advised if pt request 30 day rx the remainder of pills will be used as refills. Blake Peterson advised and voiced understanding.

## 2014-11-20 ENCOUNTER — Encounter: Payer: Self-pay | Admitting: Internal Medicine

## 2014-11-20 ENCOUNTER — Ambulatory Visit (INDEPENDENT_AMBULATORY_CARE_PROVIDER_SITE_OTHER): Payer: Medicare Other | Admitting: Internal Medicine

## 2014-11-20 VITALS — BP 142/84 | HR 71 | Temp 98.5°F | Wt 204.0 lb

## 2014-11-20 DIAGNOSIS — J209 Acute bronchitis, unspecified: Secondary | ICD-10-CM

## 2014-11-20 MED ORDER — AZITHROMYCIN 250 MG PO TABS: ORAL_TABLET | ORAL | Status: DC

## 2014-11-20 MED ORDER — HYDROCODONE-HOMATROPINE 5-1.5 MG/5ML PO SYRP: 5.0000 mL | ORAL_SOLUTION | Freq: Three times a day (TID) | ORAL | Status: DC | PRN

## 2014-11-20 NOTE — Progress Notes (Signed)
HPI  Pt presents to the clinic today with c/o runny nose, chest congeation and cough. He reports this started 1 week ago. The cough is productive of thick green mucous. He is blowing clear mucous out of his nose. He denies fever, chills or body aches. He has not tried any OTC medications. He has no history of allergies or breathing problems. He has had sick contacts. He does not smoke. He does feel like his symptoms are getting worse instead of better.  Review of Systems      Past Medical History  Diagnosis Date  . History of colonic polyps   . Prediabetes   . Diverticulosis of colon   . HTN (hypertension)   . HLD (hyperlipidemia)     Family History  Problem Relation Age of Onset  . Hypertension Sister   . Colon cancer Sister 22  . Hyperlipidemia Sister   . Colon polyps Sister   . Hypertension Sister   . Hyperlipidemia Sister   . Cancer Sister 48    colon-polyp  . Hypertension Sister   . Hyperlipidemia Sister   . Heart attack Father 41    History   Social History  . Marital Status: Married    Spouse Name: N/A  . Number of Children: 3  . Years of Education: N/A   Occupational History  . 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 Smoking status: Former Smoker    Quit date: 11/10/1984  . Smokeless tobacco: Never Used  . Alcohol Use: 0.0 oz/week    0 Standard drinks or equivalent per week     Comment: occasional  . Drug Use: No  . Sexual Activity: Not on file   Other Topics Concern  . Not on file   Social History Narrative   Lives with wife   Occupation: retired   Activity: enoys golfing    No Known Allergies   Constitutional: Positive fatigue. Denies headache, fever or abrupt weight changes.  HEENT:  Positive runny  nose. Denies eye redness, eye pain, pressure behind the eyes, facial pain, nasal congestion, ear pain, ringing in the ears, wax buildup or sore throat. Respiratory: Positive cough. Denies difficulty breathing  or shortness of breath.  Cardiovascular: Denies chest pain, chest tightness, palpitations or swelling in the hands or feet.   No other specific complaints in a complete review of systems (except as listed in HPI above).  Objective:   BP 142/84 mmHg  Pulse 71  Temp(Src) 98.5 F (36.9 C) (Oral)  Wt 204 lb (92.534 kg)  SpO2 97%  Wt Readings from Last 3 Encounters:  11/20/14 204 lb (92.534 kg)  08/10/14 204 lb (92.534 kg)  08/08/13 203 lb (92.08 kg)     General: Appears his stated age, ill appearing in NAD. HEENT: Head: normal shape and size, no sinus tenderness noted; Eyes: sclera injected, no icterus, conjunctiva pink; Ears: Tm's gray and intact, normal light reflex;  Throat/Mouth: + PND. Teeth present, mucosa pink and moist, no exudate noted, no lesions or ulcerations noted.  Neck: No lymphadenopathy.  Cardiovascular: Normal rate and rhythm. S1,S2 noted.  No murmur, rubs or gallops noted.  Pulmonary/Chest: Normal effort and bilateral inspiratory wheeze noted, scattered rhonchi throughout. No respiratory distress.      Assessment & Plan:   Acute Bronchitis:  Get some rest and drink plenty of water Try some Mucinex OTC eRx for Azithromax x 5 days Rx for Hycodan cough syrup  RTC as needed or if symptoms  persist or worsen.

## 2014-11-20 NOTE — Progress Notes (Signed)
Pre visit review using our clinic review tool, if applicable. No additional management support is needed unless otherwise documented below in the visit note. 

## 2014-11-20 NOTE — Patient Instructions (Signed)

## 2014-12-03 ENCOUNTER — Other Ambulatory Visit: Payer: Self-pay | Admitting: Family Medicine

## 2014-12-28 LAB — HM DIABETES EYE EXAM

## 2015-01-04 ENCOUNTER — Encounter: Payer: Self-pay | Admitting: Family Medicine

## 2015-01-04 DIAGNOSIS — H35033 Hypertensive retinopathy, bilateral: Secondary | ICD-10-CM | POA: Insufficient documentation

## 2015-03-22 ENCOUNTER — Ambulatory Visit (INDEPENDENT_AMBULATORY_CARE_PROVIDER_SITE_OTHER): Payer: Medicare Other | Admitting: Family Medicine

## 2015-03-22 ENCOUNTER — Encounter: Payer: Self-pay | Admitting: Family Medicine

## 2015-03-22 VITALS — BP 130/70 | HR 66 | Temp 98.5°F | Ht 68.0 in | Wt 206.8 lb

## 2015-03-22 DIAGNOSIS — S90862A Insect bite (nonvenomous), left foot, initial encounter: Secondary | ICD-10-CM | POA: Diagnosis not present

## 2015-03-22 DIAGNOSIS — W57XXXA Bitten or stung by nonvenomous insect and other nonvenomous arthropods, initial encounter: Secondary | ICD-10-CM | POA: Diagnosis not present

## 2015-03-22 DIAGNOSIS — S90869A Insect bite (nonvenomous), unspecified foot, initial encounter: Secondary | ICD-10-CM | POA: Insufficient documentation

## 2015-03-22 DIAGNOSIS — M2550 Pain in unspecified joint: Secondary | ICD-10-CM

## 2015-03-22 MED ORDER — DOXYCYCLINE HYCLATE 100 MG PO TABS: 100.0000 mg | ORAL_TABLET | Freq: Two times a day (BID) | ORAL | Status: DC

## 2015-03-22 NOTE — Patient Instructions (Signed)
Complete course of antibiotics: doxy 100 mg x 10 days.  Stop at lab on way out.  Go to ER if headache, symtpoms worsening, fever or cannot keep down antibiotics.

## 2015-03-22 NOTE — Progress Notes (Signed)
Pre visit review using our clinic review tool, if applicable. No additional management support is needed unless otherwise documented below in the visit note. 

## 2015-03-22 NOTE — Progress Notes (Signed)
   Subjective:    Patient ID: Blake Peterson, male    DOB: 05-Apr-1941, 74 y.o.   MRN: [id number]  HPI  74 year old male with history of prediabetes, HTN presents following tick bite on left ankle 02/23/2015. Removed tick, was likely presents for a while. He was doing fine until 2 week ago when began having left shoulder pain, spread to left neck. Left neck stiff.  Also soreness in left calf.  No fever.  No other joint pain.  No headache. No new neuro changes. He feels very weak and fatigued.  No rash anywhere.  Social History /Family History/Past Medical History reviewed and updated if needed.  Review of Systems  Constitutional: Positive for fatigue. Negative for fever.  HENT: Negative for ear pain.   Eyes: Negative for pain.  Respiratory: Negative for shortness of breath and wheezing.   Cardiovascular: Negative for chest pain.  Gastrointestinal: Negative for abdominal pain.  Neurological: Negative for syncope and light-headedness.       Objective:   Physical Exam  Constitutional: Vital signs are normal. He appears well-developed and well-nourished.  HENT:  Head: Normocephalic.  Right Ear: Hearing normal.  Left Ear: Hearing normal.  Nose: Nose normal.  Mouth/Throat: Oropharynx is clear and moist and mucous membranes are normal.  Neck: Trachea normal. Muscular tenderness present. No spinous process tenderness present. Carotid bruit is not present. No rigidity. Decreased range of motion present. No erythema present. No thyroid mass and no thyromegaly present.  Cardiovascular: Normal rate, regular rhythm and normal pulses.  Exam reveals no gallop, no distant heart sounds and no friction rub.   No murmur heard. No peripheral edema  Pulmonary/Chest: Effort normal and breath sounds normal. No respiratory distress.  Musculoskeletal:  Stiffness and decrease ROM in neck   neg drop arm test, neg neer's in left shoulder, just stiff with ROM.   Neurological: He has normal strength.  No cranial nerve deficit or sensory deficit. Gait normal.  Skin: Skin is warm, dry and intact. No rash noted.  Psychiatric: He has a normal mood and affect. His speech is normal and behavior is normal. Thought content normal.          Assessment & Plan:  Tick bite and now with weakness, fatigue and arthralgia:   Time course is consistent with possible tick borne illness. Will eval for tick borne illness.  Treat empirically with doxy x 10 days.

## 2015-03-23 ENCOUNTER — Other Ambulatory Visit: Payer: Self-pay | Admitting: Family Medicine

## 2015-03-26 LAB — ROCKY MTN SPOTTED FVR ABS PNL(IGG+IGM)
RMSF IGM: 0.19 IV
RMSF IgG: 0.14 IV

## 2015-03-26 LAB — B. BURGDORFI ANTIBODIES: B BURGDORFERI AB IGG+ IGM: 1.03 {ISR} — AB

## 2015-03-27 LAB — EHRLICHIA ANTIBODY PANEL
E chaffeensis (HGE) Ab, IgG: <1:64 {titer}
E chaffeensis (HGE) Ab, IgM: <1:20 {titer}

## 2015-03-28 ENCOUNTER — Telehealth: Payer: Self-pay

## 2015-03-28 LAB — B. BURGDORFI ANTIBODIES BY WB
B BURGDORFERI IGG ABS (IB): NEGATIVE
B burgdorferi IgM Abs (IB): NEGATIVE

## 2015-03-28 NOTE — Telephone Encounter (Signed)
Generic message left advising initial labs were elevated, but confirmation was normal along with all other results. Advised to finish abx and call if symptoms persist.

## 2015-03-28 NOTE — Telephone Encounter (Signed)
[name] left v/m; pt was seen 03/22/15 and lab test done to see if pt has RMSF. Mrs Makar request cb with lab results.

## 2015-03-28 NOTE — Telephone Encounter (Signed)
plz notify initial lyme disease screening test returned mildly elevated but confirmatory test was reassuringly normal. Other tests were normal. Regardless would still finish doxycycline course. Let us know if symptoms not improving with treatment.

## 2015-04-27 ENCOUNTER — Ambulatory Visit (INDEPENDENT_AMBULATORY_CARE_PROVIDER_SITE_OTHER): Payer: Medicare Other | Admitting: Internal Medicine

## 2015-04-27 ENCOUNTER — Encounter: Payer: Self-pay | Admitting: Internal Medicine

## 2015-04-27 VITALS — BP 128/80 | HR 76 | Temp 98.0°F | Wt 207.0 lb

## 2015-04-27 DIAGNOSIS — M7551 Bursitis of right shoulder: Secondary | ICD-10-CM | POA: Diagnosis not present

## 2015-04-27 DIAGNOSIS — M7550 Bursitis of unspecified shoulder: Secondary | ICD-10-CM | POA: Insufficient documentation

## 2015-04-27 DIAGNOSIS — M25511 Pain in right shoulder: Secondary | ICD-10-CM | POA: Insufficient documentation

## 2015-04-27 MED ORDER — METHYLPREDNISOLONE ACETATE 40 MG/ML IJ SUSP
40.0000 mg | Freq: Once | INTRAMUSCULAR | Status: AC
Start: 2015-04-27 — End: 2015-04-27
  Administered 2015-04-27: 40 mg via INTRA_ARTICULAR

## 2015-04-27 NOTE — Assessment & Plan Note (Signed)
Probably multifactorial Some arthritis likely Past rotator cuff injury Seems like acute bursitis is major problem now

## 2015-04-27 NOTE — Addendum Note (Signed)
Addended by: Lowella Dandy on: 04/27/2015 01:00 PM   Modules accepted: Orders, SmartSet

## 2015-04-27 NOTE — Progress Notes (Signed)
Subjective:    Patient ID: Blake Peterson, male    DOB: [birth year], 74 y.o.   MRN: [id number]  HPI Here due to shoulder pain  Has been having problems for some time Clearly was more noticeable over the past 2 months Had tick bite and noted stiffness in neck and shoulders Tests for RMSF were negative but he got some treatment  No clear injury but did flip a mower base doing a repair and he may have strained it He notes increased stiffness after golf--- last about 3 weeks ago  Not able to sleep last night due to increased pain  Has tried ibuprofen '600mg'$ --- last night --- and '400mg'$  this AM Not clearly helpful Arm is weak  Current Outpatient Prescriptions on File Prior to Visit  Medication Sig Dispense Refill  . aspirin 81 MG tablet Take 81 mg by mouth daily.      Marland Kitchen atenolol (TENORMIN) 50 MG tablet   3  . atorvastatin (LIPITOR) 80 MG tablet TAKE 1 TABLET BY MOUTH AT BEDTIME 30 tablet 3  . chlorthalidone (HYGROTON) 25 MG tablet Take 1 tablet (25 mg total) by mouth daily. 90 tablet 3  . cholecalciferol (VITAMIN D) 1000 UNITS tablet Take 1,000 Units by mouth daily.      . enalapril (VASOTEC) 10 MG tablet TAKE 1 TABLET BY MOUTH 2 TIMES A DAY 60 tablet 3  . fish oil-omega-3 fatty acids 1000 MG capsule Take 2 g by mouth daily.      . sildenafil (VIAGRA) 100 MG tablet Take 100 mg by mouth daily as needed.       No current facility-administered medications on file prior to visit.    No Known Allergies  Past Medical History  Diagnosis Date  . History of colonic polyps   . Prediabetes   . Diverticulosis of colon   . HTN (hypertension)   . HLD (hyperlipidemia)   . Hypertensive retinopathy of both eyes, grade 1     Past Surgical History  Procedure Laterality Date  . Umbilical hernia repair  01/07/04  . Colonoscopy  11/2012    mild-mod diverticulosis, int hem, rec rpt 5 yrs [facility] Plan)    Family History  Problem Relation Age of Onset  . Hypertension Sister   . Colon cancer  Sister 18  . Hyperlipidemia Sister   . Colon polyps Sister   . Hypertension Sister   . Hyperlipidemia Sister   . Cancer Sister 52    colon-polyp  . Hypertension Sister   . Hyperlipidemia Sister   . Heart attack Father 40    History   Social History  . Marital Status: Married    Spouse Name: N/A  . Number of Children: 3  . Years of Education: N/A   Occupational History  . 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 Smoking status: Former Smoker    Quit date: 11/10/1984  . Smokeless tobacco: Never Used  . Alcohol Use: 0.0 oz/week    0 Standard drinks or equivalent per week     Comment: occasional  . Drug Use: No  . Sexual Activity: Not on file   Other Topics Concern  . Not on file   Social History Narrative   Lives with wife   Occupation: retired   Activity: enoys golfing   Review of Systems Rotator cuff injury 15 years ago--resolved on its own Not sick No fever    Objective:   Physical Exam  Constitutional: He appears well-developed  and well-nourished. No distress.  Musculoskeletal:  No right shoulder swelling Holds tight at side Resists anything other than minimal abduction or rotation Some localized subacromial tenderness          Assessment & Plan:

## 2015-04-27 NOTE — Progress Notes (Signed)
Pre visit review using our clinic review tool, if applicable. No additional management support is needed unless otherwise documented below in the visit note. 

## 2015-04-27 NOTE — Assessment & Plan Note (Signed)
Discussed alternatives Agreed to try cortisone shot  Sterile prep--anterior approach 1cc 1% lido '40mg'$  depomedrol and 5cc 1% lido instilled in area around bursa Tolerated well with marked increase in ROM  Discussed ice prn and ibuprofen

## 2015-04-29 ENCOUNTER — Telehealth: Payer: Self-pay

## 2015-04-29 NOTE — Telephone Encounter (Signed)
PLEASE NOTE: All timestamps contained within this report are represented as [place] Standard Time. CONFIDENTIALTY NOTICE: This fax transmission is intended only for the addressee. It contains information that is legally privileged, confidential or otherwise protected from use or disclosure. If you are not the intended recipient, you are strictly prohibited from reviewing, disclosing, copying using or disseminating any of this information or taking any action in reliance on or regarding this information. If you have received this fax in error, please notify us immediately by telephone so that we can arrange for its return to [place]. Phone: [phone], Toll-Free: [phone], Fax: [phone] Page: 1 of 2 Call Id: [id number] [place] Patient Name: Blake Peterson Gender: Male DOB: July 31, 1941 Age: 74 Y 80 M 9 D Return Phone Number: [phone] (Primary), [phone] (Secondary) Address: [address] [phone] Dr) [place] [address] Client [place] Night - Client Client Site [facility] Physician Ria Bush Contact Type Call Call Type Triage / Clinical Caller Name Kishawn Pickar Relationship To Patient Spouse Return Phone Number [phone] (Primary) Chief Complaint Shoulder Injury Initial Comment Caller states husband shoulder is hurting so bad, any where can be seen? not an injury; [facility] PreDisposition Go to Urgent Care/Walk-In Clinic Nurse Assessment Nurse: Venetia Maxon, RN, Manuela Schwartz Date/Time (Eastern Time): 04/27/2015 10:48:30 AM Confirm and document reason for call. If symptomatic, describe symptoms. ---Caller states husband RT shoulder is hurting so bad, any where can be seen? not an injury; He had a tick bite in June and pain in left shoulder to the neck. He carried out trash  can Thurs. Pain began last pm. kept him up all night no fever Has the patient traveled out of the country within the last 30 days? ---No Does the patient require triage? ---Yes Related visit to physician within the last 2 weeks? ---No Does the PT have any chronic conditions? (i.e. diabetes, asthma, etc.) ---Yes List chronic conditions. ---HTN cholesterol arthritis ? Guidelines Guideline Title Affirmed Question Affirmed Notes Nurse Date/Time Eilene Ghazi Time) Shoulder Pain Weakness (i.e., loss of strength) in hand or fingers (Exception: not truly weak; hand feels weak because of pain) Venetia Maxon, RN, Manuela Schwartz 04/27/2015 10:50:43 AM Disp. Time Eilene Ghazi Time) Disposition Final User 04/27/2015 10:51:53 AM See Physician within 4 Hours (or PCP triage) Yes Venetia Maxon, RN, Liliane Bade NOTE: All timestamps contained within this report are represented as [place] Standard Time. CONFIDENTIALTY NOTICE: This fax transmission is intended only for the addressee. It contains information that is legally privileged, confidential or otherwise protected from use or disclosure. If you are not the intended recipient, you are strictly prohibited from reviewing, disclosing, copying using or disseminating any of this information or taking any action in reliance on or regarding this information. If you have received this fax in error, please notify us immediately by telephone so that we can arrange for its return to [place]. Phone: [phone], Toll-Free: [phone], Fax: [phone] Page: 2 of 2 Call Id: [id number] [place] Understands: Yes Disagree/Comply: Comply Care Advice Given Per Guideline SEE PHYSICIAN WITHIN 4 HOURS (or PCP triage): CALL BACK IF: * You become worse. CARE ADVICE given per Shoulder Pain (Adult) guideline After Care Instructions Given Call Event Type User Date / Time Description Referrals [facility]

## 2015-04-29 NOTE — Telephone Encounter (Signed)
Pt was seen at [facility] 04/27/15.

## 2015-06-05 ENCOUNTER — Ambulatory Visit (INDEPENDENT_AMBULATORY_CARE_PROVIDER_SITE_OTHER): Payer: Medicare Other

## 2015-06-05 DIAGNOSIS — Z23 Encounter for immunization: Secondary | ICD-10-CM

## 2015-07-02 ENCOUNTER — Encounter: Payer: Self-pay | Admitting: Gastroenterology

## 2015-07-20 ENCOUNTER — Other Ambulatory Visit: Payer: Self-pay | Admitting: Family Medicine

## 2015-07-24 ENCOUNTER — Other Ambulatory Visit: Payer: Self-pay | Admitting: *Deleted

## 2015-07-24 MED ORDER — ATORVASTATIN CALCIUM 80 MG PO TABS: 80.0000 mg | ORAL_TABLET | Freq: Every day | ORAL | Status: DC

## 2015-07-24 MED ORDER — ENALAPRIL MALEATE 10 MG PO TABS: 10.0000 mg | ORAL_TABLET | Freq: Two times a day (BID) | ORAL | Status: DC

## 2015-08-10 ENCOUNTER — Other Ambulatory Visit: Payer: Self-pay | Admitting: Family Medicine

## 2015-08-10 DIAGNOSIS — I10 Essential (primary) hypertension: Secondary | ICD-10-CM

## 2015-08-10 DIAGNOSIS — E785 Hyperlipidemia, unspecified: Secondary | ICD-10-CM

## 2015-08-10 DIAGNOSIS — Z125 Encounter for screening for malignant neoplasm of prostate: Secondary | ICD-10-CM

## 2015-08-10 DIAGNOSIS — R7303 Prediabetes: Secondary | ICD-10-CM

## 2015-08-10 DIAGNOSIS — E559 Vitamin D deficiency, unspecified: Secondary | ICD-10-CM

## 2015-08-14 ENCOUNTER — Other Ambulatory Visit (INDEPENDENT_AMBULATORY_CARE_PROVIDER_SITE_OTHER): Payer: Medicare Other

## 2015-08-14 DIAGNOSIS — I10 Essential (primary) hypertension: Secondary | ICD-10-CM

## 2015-08-14 DIAGNOSIS — E559 Vitamin D deficiency, unspecified: Secondary | ICD-10-CM | POA: Diagnosis not present

## 2015-08-14 DIAGNOSIS — E785 Hyperlipidemia, unspecified: Secondary | ICD-10-CM

## 2015-08-14 DIAGNOSIS — R7303 Prediabetes: Secondary | ICD-10-CM

## 2015-08-14 DIAGNOSIS — Z125 Encounter for screening for malignant neoplasm of prostate: Secondary | ICD-10-CM | POA: Diagnosis not present

## 2015-08-14 LAB — LIPID PANEL
Cholesterol: 151 mg/dL (ref 0–200)
HDL: 44.3 mg/dL (ref >39.00)
LDL Cholesterol: 86 mg/dL (ref 0–99)
NONHDL: 106.78
TRIGLYCERIDES: 105 mg/dL (ref 0.0–149.0)
Total CHOL/HDL Ratio: 3
VLDL: 21 mg/dL (ref 0.0–40.0)

## 2015-08-14 LAB — BASIC METABOLIC PANEL
BUN: 23 mg/dL (ref 6–23)
CALCIUM: 9.6 mg/dL (ref 8.4–10.5)
CO2: 30 mEq/L (ref 19–32)
Chloride: 97 mEq/L (ref 96–112)
Creatinine, Ser: 0.86 mg/dL (ref 0.40–1.50)
GFR: 92.7 mL/min (ref >60.00)
GLUCOSE: 118 mg/dL — AB (ref 70–99)
POTASSIUM: 3.8 meq/L (ref 3.5–5.1)
Sodium: 136 mEq/L (ref 135–145)

## 2015-08-14 LAB — VITAMIN D 25 HYDROXY (VIT D DEFICIENCY, FRACTURES): VITD: 25.37 ng/mL — AB (ref 30.00–100.00)

## 2015-08-14 LAB — PSA, MEDICARE: PSA: 3.73 ng/mL (ref 0.10–4.00)

## 2015-08-14 LAB — HEMOGLOBIN A1C: Hgb A1c MFr Bld: 6.2 % (ref 4.6–6.5)

## 2015-08-16 ENCOUNTER — Other Ambulatory Visit: Payer: Medicare Other

## 2015-08-23 ENCOUNTER — Encounter: Payer: Medicare Other | Admitting: Family Medicine

## 2015-08-27 ENCOUNTER — Ambulatory Visit (INDEPENDENT_AMBULATORY_CARE_PROVIDER_SITE_OTHER): Payer: Medicare Other | Admitting: Family Medicine

## 2015-08-27 ENCOUNTER — Encounter: Payer: Self-pay | Admitting: Family Medicine

## 2015-08-27 VITALS — BP 134/86 | HR 76 | Temp 97.7°F | Ht 68.0 in | Wt 212.8 lb

## 2015-08-27 DIAGNOSIS — E785 Hyperlipidemia, unspecified: Secondary | ICD-10-CM

## 2015-08-27 DIAGNOSIS — I10 Essential (primary) hypertension: Secondary | ICD-10-CM

## 2015-08-27 DIAGNOSIS — Z0000 Encounter for general adult medical examination without abnormal findings: Secondary | ICD-10-CM | POA: Diagnosis not present

## 2015-08-27 DIAGNOSIS — E663 Overweight: Secondary | ICD-10-CM | POA: Insufficient documentation

## 2015-08-27 DIAGNOSIS — E559 Vitamin D deficiency, unspecified: Secondary | ICD-10-CM

## 2015-08-27 DIAGNOSIS — E669 Obesity, unspecified: Secondary | ICD-10-CM

## 2015-08-27 DIAGNOSIS — H35033 Hypertensive retinopathy, bilateral: Secondary | ICD-10-CM

## 2015-08-27 DIAGNOSIS — Z7189 Other specified counseling: Secondary | ICD-10-CM

## 2015-08-27 DIAGNOSIS — M2550 Pain in unspecified joint: Secondary | ICD-10-CM

## 2015-08-27 DIAGNOSIS — R7303 Prediabetes: Secondary | ICD-10-CM

## 2015-08-27 NOTE — Assessment & Plan Note (Signed)
Reviewed #s with patient. 

## 2015-08-27 NOTE — Progress Notes (Signed)
Pre visit review using our clinic review tool, if applicable. No additional management support is needed unless otherwise documented below in the visit note. 

## 2015-08-27 NOTE — Assessment & Plan Note (Signed)
Chronic stable. Continue lipitor '80mg'$  daily. Suggested ok to stop fish oil, rec increased fatty fish in diet.

## 2015-08-27 NOTE — Assessment & Plan Note (Signed)
Discussed healthy diet and lifestyle changes to affect sustainable weight loss  

## 2015-08-27 NOTE — Patient Instructions (Addendum)
Work on advanced directive this year.  Increase walking pace - watch weight. Return as needed or in 1 year for next medicare wellness visit. Good to see you today, call us with questions.  Health Maintenance, Male A healthy lifestyle and preventative care can promote health and wellness.  Maintain regular health, dental, and eye exams.  Eat a healthy diet. Foods like vegetables, fruits, whole grains, low-fat dairy products, and lean protein foods contain the nutrients you need and are low in calories. Decrease your intake of foods high in solid fats, added sugars, and salt. Get information about a proper diet from your health care provider, if necessary.  Regular physical exercise is one of the most important things you can do for your health. Most adults should get at least 150 minutes of moderate-intensity exercise (any activity that increases your heart rate and causes you to sweat) each week. In addition, most adults need muscle-strengthening exercises on 2 or more days a week.   Maintain a healthy weight. The body mass index (BMI) is a screening tool to identify possible weight problems. It provides an estimate of body fat based on height and weight. Your health care provider can find your BMI and can help you achieve or maintain a healthy weight. For males 20 years and older:  A BMI below 18.5 is considered underweight.  A BMI of 18.5 to 24.9 is normal.  A BMI of 25 to 29.9 is considered overweight.  A BMI of 30 and above is considered obese.  Maintain normal blood lipids and cholesterol by exercising and minimizing your intake of saturated fat. Eat a balanced diet with plenty of fruits and vegetables. Blood tests for lipids and cholesterol should begin at age 36 and be repeated every 5 years. If your lipid or cholesterol levels are high, you are over age 64, or you are at high risk for heart disease, you may need your cholesterol levels checked more frequently.Ongoing high lipid and  cholesterol levels should be treated with medicines if diet and exercise are not working.  If you smoke, find out from your health care provider how to quit. If you do not use tobacco, do not start.  Lung cancer screening is recommended for adults aged 15-80 years who are at high risk for developing lung cancer because of a history of smoking. A yearly low-dose CT scan of the lungs is recommended for people who have at least a 30-pack-year history of smoking and are current smokers or have quit within the past 15 years. A pack year of smoking is smoking an average of 1 pack of cigarettes a day for 1 year (for example, a 30-pack-year history of smoking could mean smoking 1 pack a day for 30 years or 2 packs a day for 15 years). Yearly screening should continue until the smoker has stopped smoking for at least 15 years. Yearly screening should be stopped for people who develop a health problem that would prevent them from having lung cancer treatment.  If you choose to drink alcohol, do not have more than 2 drinks per day. One drink is considered to be 12 oz (360 mL) of beer, 5 oz (150 mL) of wine, or 1.5 oz (45 mL) of liquor.  Avoid the use of street drugs. Do not share needles with anyone. Ask for help if you need support or instructions about stopping the use of drugs.  High blood pressure causes heart disease and increases the risk of stroke. High blood pressure is  more likely to develop in:  People who have blood pressure in the end of the normal range (100-139/85-89 mm Hg).  People who are overweight or obese.  People who are African American.  If you are 35-67 years of age, have your blood pressure checked every 3-5 years. If you are 43 years of age or older, have your blood pressure checked every year. You should have your blood pressure measured twice--once when you are at a hospital or clinic, and once when you are not at a hospital or clinic. Record the average of the two measurements. To  check your blood pressure when you are not at a hospital or clinic, you can use:  An automated blood pressure machine at a pharmacy.  A home blood pressure monitor.  If you are 2-56 years old, ask your health care provider if you should take aspirin to prevent heart disease.  Diabetes screening involves taking a blood sample to check your fasting blood sugar level. This should be done once every 3 years after age 79 if you are at a normal weight and without risk factors for diabetes. Testing should be considered at a younger age or be carried out more frequently if you are overweight and have at least 1 risk factor for diabetes.  Colorectal cancer can be detected and often prevented. Most routine colorectal cancer screening begins at the age of 63 and continues through age 22. However, your health care provider may recommend screening at an earlier age if you have risk factors for colon cancer. On a yearly basis, your health care provider may provide home test kits to check for hidden blood in the stool. A small camera at the end of a tube may be used to directly examine the colon (sigmoidoscopy or colonoscopy) to detect the earliest forms of colorectal cancer. Talk to your health care provider about this at age 47 when routine screening begins. A direct exam of the colon should be repeated every 5-10 years through age 20, unless early forms of precancerous polyps or small growths are found.  People who are at an increased risk for hepatitis B should be screened for this virus. You are considered at high risk for hepatitis B if:  You were born in a country where hepatitis B occurs often. Talk with your health care provider about which countries are considered high risk.  Your parents were born in a high-risk country and you have not received a shot to protect against hepatitis B (hepatitis B vaccine).  You have HIV or AIDS.  You use needles to inject street drugs.  You live with, or have sex  with, someone who has hepatitis B.  You are a man who has sex with other men (MSM).  You get hemodialysis treatment.  You take certain medicines for conditions like cancer, organ transplantation, and autoimmune conditions.  Hepatitis C blood testing is recommended for all people born from [birth year] through 1965 and any individual with known risk factors for hepatitis C.  Healthy men should no longer receive prostate-specific antigen (PSA) blood tests as part of routine cancer screening. Talk to your health care provider about prostate cancer screening.  Testicular cancer screening is not recommended for adolescents or adult males who have no symptoms. Screening includes self-exam, a health care provider exam, and other screening tests. Consult with your health care provider about any symptoms you have or any concerns you have about testicular cancer.  Practice safe sex. Use condoms and avoid high-risk sexual  practices to reduce the spread of sexually transmitted infections (STIs).  You should be screened for STIs, including gonorrhea and chlamydia if:  You are sexually active and are younger than 24 years.  You are older than 24 years, and your health care provider tells you that you are at risk for this type of infection.  Your sexual activity has changed since you were last screened, and you are at an increased risk for chlamydia or gonorrhea. Ask your health care provider if you are at risk.  If you are at risk of being infected with HIV, it is recommended that you take a prescription medicine daily to prevent HIV infection. This is called pre-exposure prophylaxis (PrEP). You are considered at risk if:  You are a man who has sex with other men (MSM).  You are a heterosexual man who is sexually active with multiple partners.  You take drugs by injection.  You are sexually active with a partner who has HIV.  Talk with your health care provider about whether you are at high risk of being  infected with HIV. If you choose to begin PrEP, you should first be tested for HIV. You should then be tested every 3 months for as long as you are taking PrEP.  Use sunscreen. Apply sunscreen liberally and repeatedly throughout the day. You should seek shade when your shadow is shorter than you. Protect yourself by wearing long sleeves, pants, a wide-brimmed hat, and sunglasses year round whenever you are outdoors.  Tell your health care provider of new moles or changes in moles, especially if there is a change in shape or color. Also, tell your health care provider if a mole is larger than the size of a pencil eraser.  A one-time screening for abdominal aortic aneurysm (AAA) and surgical repair of large AAAs by ultrasound is recommended for men aged 65-75 years who are current or former smokers.  Stay current with your vaccines (immunizations).   This information is not intended to replace advice given to you by your health care provider. Make sure you discuss any questions you have with your health care provider.   Document Released: 03/05/2008 Document Revised: 09/28/2014 Document Reviewed: 02/02/2011 Elsevier Interactive Patient Education [employer].

## 2015-08-27 NOTE — Assessment & Plan Note (Signed)
Advanced directives: wants to hire lawyer. Would want wife to be HCPOA. 

## 2015-08-27 NOTE — Assessment & Plan Note (Signed)
Chronic, stable. Continue current regimen. 

## 2015-08-27 NOTE — Assessment & Plan Note (Signed)
Pt forgot to restart - will restart today.

## 2015-08-27 NOTE — Progress Notes (Signed)
BP 134/86 mmHg  Pulse 76  Temp(Src) 97.7 F (36.5 C) (Oral)  Ht '5\' 8"'$  (1.727 m)  Wt 212 lb 12 oz (96.503 kg)  BMI 32.36 kg/m2     CC: medicare wellness visit  Subjective:    Patient ID: Blake Peterson, male    DOB: 1941/03/03, 74 y.o.   MRN: [id number]  HPI: Blake Peterson is a 74 y.o. 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 ([place]) No falls in last year.  Denies anhedonia, depression, sadness.  Preventative: Colonoscopy - 11/2012 diverticulosis and hemorrhoids, no polyps , rec rpt 5 yrs [facility]). Prostate - always normal. nocturia x 1 maybe. strong stream. Would like to continue screening for next few years. Uncle with prostate cancer.  flu shot 06/21/2015 tetanus shot - 2014 Pneumovax 2010, prevnar 2015 Shingles 2010 Advanced directives: wants to hire lawyer. Would want wife to be HCPOA.  Seat belt use discussed Sunscreen use discussed. No changing moles on skin. Sees derm.  Lives with wife  Occupation: retired, was Clinical biochemist Activity: Electrical engineer, tries walking 3x/wk Diet: good water, fruits/vegetables daily  Relevant past medical, surgical, family and social history reviewed and updated as indicated. Interim medical history since our last visit reviewed. Allergies and medications reviewed and updated. Current Outpatient Prescriptions on File Prior to Visit  Medication Sig  . aspirin 81 MG tablet Take 81 mg by mouth daily.    Marland Kitchen atenolol (TENORMIN) 50 MG tablet Take 50 mg by mouth daily.   Marland Kitchen atorvastatin (LIPITOR) 80 MG tablet Take 1 tablet (80 mg total) by mouth at bedtime.  . chlorthalidone (HYGROTON) 25 MG tablet Take 1 tablet (25 mg total) by mouth daily.  . cholecalciferol (VITAMIN D) 1000 UNITS tablet Take 1,000 Units by mouth daily.    . enalapril (VASOTEC) 10 MG tablet Take 1 tablet (10 mg total) by mouth 2 (two) times daily.  .  sildenafil (VIAGRA) 100 MG tablet Take 100 mg by mouth daily as needed.     No current facility-administered medications on file prior to visit.    Review of Systems Per HPI unless specifically indicated in ROS section     Objective:    BP 134/86 mmHg  Pulse 76  Temp(Src) 97.7 F (36.5 C) (Oral)  Ht '5\' 8"'$  (1.727 m)  Wt 212 lb 12 oz (96.503 kg)  BMI 32.36 kg/m2  Wt Readings from Last 3 Encounters:  08/27/15 212 lb 12 oz (96.503 kg)  04/27/15 207 lb (93.895 kg)  03/22/15 206 lb 12 oz (93.781 kg)    Physical Exam  Constitutional: He is oriented to person, place, and time. He appears well-developed and well-nourished. No distress.  HENT:  Head: Normocephalic and atraumatic.  Right Ear: Hearing, tympanic membrane, external ear and ear canal normal.  Left Ear: Hearing, tympanic membrane, external ear and ear canal normal.  Nose: Nose normal.  Mouth/Throat: Uvula is midline, oropharynx is clear and moist and mucous membranes are normal. No oropharyngeal exudate, posterior oropharyngeal edema or posterior oropharyngeal erythema.  Eyes: Conjunctivae and EOM are normal. Pupils are equal, round, and reactive to light. No scleral icterus.  Neck: Normal range of motion. Neck supple. Carotid bruit is not present. No thyromegaly present.  Cardiovascular: Normal rate, regular rhythm, normal heart sounds and intact distal pulses.   No murmur heard. Pulses:      Radial pulses are 2+  on the right side, and 2+ on the left side.  Pulmonary/Chest: Effort normal and breath sounds normal. No respiratory distress. He has no wheezes. He has no rales.  Abdominal: Soft. Bowel sounds are normal. He exhibits no distension and no mass. There is no tenderness. There is no rebound and no guarding.  Genitourinary: Rectum normal and prostate normal. Rectal exam shows no external hemorrhoid, no fissure, no mass, no tenderness and anal tone normal. Prostate is not enlarged (15gm) and not tender.  Musculoskeletal:  Normal range of motion. He exhibits no edema.  Lymphadenopathy:    He has no cervical adenopathy.  Neurological: He is alert and oriented to person, place, and time.  CN grossly intact, station and gait intact Recall 3/3 Calculation 5/5 serial 3s  Skin: Skin is warm and dry. No rash noted.  Psychiatric: He has a normal mood and affect. His behavior is normal. Judgment and thought content normal.  Nursing note and vitals reviewed.  Results for orders placed or performed in visit on 08/14/15  VITAMIN D 25 Hydroxy (Vit-D Deficiency, Fractures)  Result Value Ref Range   VITD 25.37 (L) 30.00 - 100.00 ng/mL  Lipid panel  Result Value Ref Range   Cholesterol 151 0 - 200 mg/dL   Triglycerides 105.0 0.0 - 149.0 mg/dL   HDL 44.30 >39.00 mg/dL   VLDL 21.0 0.0 - 40.0 mg/dL   LDL Cholesterol 86 0 - 99 mg/dL   Total CHOL/HDL Ratio 3    NonHDL 106.78   Hemoglobin A1c  Result Value Ref Range   Hgb A1c MFr Bld 6.2 4.6 - 6.5 %  Basic metabolic panel  Result Value Ref Range   Sodium 136 135 - 145 mEq/L   Potassium 3.8 3.5 - 5.1 mEq/L   Chloride 97 96 - 112 mEq/L   CO2 30 19 - 32 mEq/L   Glucose, Bld 118 (H) 70 - 99 mg/dL   BUN 23 6 - 23 mg/dL   Creatinine, Ser 0.86 0.40 - 1.50 mg/dL   Calcium 9.6 8.4 - 10.5 mg/dL   GFR 92.70 >60.00 mL/min  PSA, Medicare  Result Value Ref Range   PSA 3.73 0.10 - 4.00 ng/ml      Assessment & Plan:   Problem List Items Addressed This Visit    Vitamin D deficiency    Pt forgot to restart - will restart today.      Prediabetes    Reviewed #s with patient.      Obesity, Class I, BMI 30-34.9    Discussed healthy diet and lifestyle changes to affect sustainable weight loss.      Medicare annual wellness visit, subsequent - Primary    I have personally reviewed the Medicare Annual Wellness questionnaire and have noted 1. The patient's medical and social history 2. Their use of alcohol, tobacco or illicit drugs 3. Their current medications and  supplements 4. The patient's functional ability including ADL's, fall risks, home safety risks and hearing or visual impairment. Cognitive function has been assessed and addressed as indicated.  5. Diet and physical activity 6. Evidence for depression or mood disorders The patients weight, height, BMI have been recorded in the chart. I have made referrals, counseling and provided education to the patient based on review of the above and I have provided the pt with a written personalized care plan for preventive services. Provider list updated.. See scanned questionairre as needed for further documentation. Reviewed preventative protocols and updated unless pt declined.  Hypertensive retinopathy of both eyes, grade 1   HTN (hypertension)    Chronic, stable. Continue current regimen.      HLD (hyperlipidemia)    Chronic stable. Continue lipitor '80mg'$  daily. Suggested ok to stop fish oil, rec increased fatty fish in diet.      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 Recommend tylenol prn.       Advanced care planning/counseling discussion    Advanced directives: wants to hire lawyer. Would want wife to be HCPOA.           Follow up plan: Return in about 1 year (around 08/26/2016), or as needed, for medicare wellness visit.

## 2015-08-27 NOTE — Assessment & Plan Note (Signed)
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 Recommend tylenol prn.

## 2015-08-27 NOTE — Assessment & Plan Note (Signed)

## 2015-11-15 ENCOUNTER — Encounter: Payer: Self-pay | Admitting: Family Medicine

## 2015-11-15 ENCOUNTER — Ambulatory Visit (INDEPENDENT_AMBULATORY_CARE_PROVIDER_SITE_OTHER): Payer: PPO | Admitting: Family Medicine

## 2015-11-15 VITALS — BP 142/82 | HR 60 | Temp 97.5°F | Wt 211.5 lb

## 2015-11-15 DIAGNOSIS — M79672 Pain in left foot: Secondary | ICD-10-CM | POA: Diagnosis not present

## 2015-11-15 LAB — URIC ACID: Uric Acid, Serum: 6.1 mg/dL (ref 4.0–7.8)

## 2015-11-15 NOTE — Progress Notes (Signed)
.   BP 142/82 mmHg  Pulse 60  Temp(Src) 97.5 F (36.4 C) (Oral)  Wt 211 lb 8 oz (95.936 kg)  SpO2 98%   CC: L foot pain  Subjective:    Patient ID: Blake Peterson, male    DOB: 11/20/1940, 75 y.o.   MRN: [id number]  HPI: Blake Peterson is a 75 y.o. male presenting on 11/15/2015 for Ankle Pain   Several month h/o L foot pain, acutely worse over last 5 days. Tends to flare with increased activity. Yesterday did start feeling better. Points to medial ankle as site of pain, starting from 1st MTP traveling up medial leg. Feet occasionally feel numb.   1st MTP did get red and swollen, but no warmth.  No h/o gout.  Denies inciting trauma/injury or falls.  Endorses intermittent arthralgias of upper extremities. Treating with ibuprofen which helped. Lab Results  Component Value Date   CREATININE 0.86 08/14/2015     Had to cancel yesterday's golf game.  Relevant past medical, surgical, family and social history reviewed and updated as indicated. Interim medical history since our last visit reviewed. Allergies and medications reviewed and updated. Current Outpatient Prescriptions on File Prior to Visit  Medication Sig  . aspirin 81 MG tablet Take 81 mg by mouth daily.    Marland Kitchen atenolol (TENORMIN) 50 MG tablet Take 50 mg by mouth daily.   Marland Kitchen atorvastatin (LIPITOR) 80 MG tablet Take 1 tablet (80 mg total) by mouth at bedtime.  . chlorthalidone (HYGROTON) 25 MG tablet Take 1 tablet (25 mg total) by mouth daily.  . cholecalciferol (VITAMIN D) 1000 UNITS tablet Take 1,000 Units by mouth daily.    . enalapril (VASOTEC) 10 MG tablet Take 1 tablet (10 mg total) by mouth 2 (two) times daily.  . sildenafil (VIAGRA) 100 MG tablet Take 100 mg by mouth daily as needed.     No current facility-administered medications on file prior to visit.    Review of Systems Per HPI unless specifically indicated in ROS section     Objective:    BP 142/82 mmHg  Pulse 60  Temp(Src) 97.5 F (36.4 C)  (Oral)  Wt 211 lb 8 oz (95.936 kg)  SpO2 98%  Wt Readings from Last 3 Encounters:  11/15/15 211 lb 8 oz (95.936 kg)  08/27/15 212 lb 12 oz (96.503 kg)  04/27/15 207 lb (93.895 kg)    Physical Exam  Constitutional: He appears well-developed and well-nourished. No distress.  Musculoskeletal: Normal range of motion. He exhibits no edema.  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 No rash noted.  Nursing note and vitals reviewed.  Results for orders placed or performed in visit on 08/14/15  VITAMIN D 25 Hydroxy (Vit-D Deficiency, Fractures)  Result Value Ref Range   VITD 25.37 (L) 30.00 - 100.00 ng/mL  Lipid panel  Result Value Ref Range   Cholesterol 151 0 - 200 mg/dL   Triglycerides 105.0 0.0 - 149.0 mg/dL   HDL 44.30 >39.00 mg/dL   VLDL 21.0 0.0 - 40.0 mg/dL   LDL Cholesterol 86 0 - 99 mg/dL   Total CHOL/HDL Ratio 3    NonHDL 106.78   Hemoglobin A1c  Result Value Ref Range   Hgb A1c MFr Bld  6.2 4.6 - 6.5 %  Basic metabolic panel  Result Value Ref Range   Sodium 136 135 - 145 mEq/L   Potassium 3.8 3.5 - 5.1 mEq/L   Chloride 97 96 - 112 mEq/L   CO2 30 19 - 32 mEq/L   Glucose, Bld 118 (H) 70 - 99 mg/dL   BUN 23 6 - 23 mg/dL   Creatinine, Ser 0.86 0.40 - 1.50 mg/dL   Calcium 9.6 8.4 - 10.5 mg/dL   GFR 92.70 >60.00 mL/min  PSA, Medicare  Result Value Ref Range   PSA 3.73 0.10 - 4.00 ng/ml      Assessment & Plan:   Problem List Items Addressed This Visit    Left foot pain - Primary    Anticipate pain from poor foot mechanics from pes planus with chronic lateral foot deviation. Less likely acute gout flare. Check uric acid. Refer to [facility] to discuss possible orthotics.  Pain now resolving. Discussed ibuprofen use.  If  recurrent flares, consider change from thiazide diuretic.      Relevant Orders   Uric acid       Follow up plan: Return if symptoms worsen or fail to improve.

## 2015-11-15 NOTE — Assessment & Plan Note (Signed)
Anticipate pain from poor foot mechanics from pes planus with chronic lateral foot deviation. Less likely acute gout flare. Check uric acid. Refer to [facility] to discuss possible orthotics.  Pain now resolving. Discussed ibuprofen use.  If recurrent flares, consider change from thiazide diuretic.

## 2015-11-15 NOTE — Patient Instructions (Signed)
I think this is from poor foot mechanics - flat footed. Schedule appointment up front to see Dr Lorelei Pont to discuss possible custom orthotics.  This could also possibly be from gout. Check labs today. Ok to continue ibuporfen '400mg'$  with meals as needed. Let us know if recurrent or worsening pain.

## 2015-11-27 ENCOUNTER — Encounter: Payer: Self-pay | Admitting: Family Medicine

## 2015-11-27 ENCOUNTER — Ambulatory Visit (INDEPENDENT_AMBULATORY_CARE_PROVIDER_SITE_OTHER): Payer: PPO | Admitting: Family Medicine

## 2015-11-27 VITALS — BP 140/80 | Temp 97.6°F | Ht 68.0 in | Wt 216.0 lb

## 2015-11-27 DIAGNOSIS — M2142 Flat foot [pes planus] (acquired), left foot: Secondary | ICD-10-CM | POA: Diagnosis not present

## 2015-11-27 DIAGNOSIS — M79672 Pain in left foot: Secondary | ICD-10-CM

## 2015-11-27 DIAGNOSIS — M2141 Flat foot [pes planus] (acquired), right foot: Secondary | ICD-10-CM | POA: Diagnosis not present

## 2015-11-27 DIAGNOSIS — M76822 Posterior tibial tendinitis, left leg: Secondary | ICD-10-CM

## 2015-11-27 NOTE — Progress Notes (Signed)
Pre visit review using our clinic review tool, if applicable. No additional management support is needed unless otherwise documented below in the visit note. 

## 2015-11-27 NOTE — Progress Notes (Signed)
Dr. Frederico Hamman T. Brinnley Lacap, MD, [facility] [place], [address] Phone: [phone] Fax: [phone]  11/27/2015  Patient: Blake Peterson, MRN: [id number], DOB: 12/14/1940, 75 y.o.  Primary Physician:  Ria Bush, MD   Chief Complaint  Patient presents with  . Foot Orthotics   Subjective:   Blake Peterson is a 75 y.o. very pleasant male patient who presents with the following:  Very pleasant patient referred by courtesy of my partner Dr. Darnell Level who presents with chronic primarily left-sided foot pain is been intermittently bothering him over many years.   He primarily will get some pain and swelling medially more in the region of the navicular and posterior to the malleolus continuing through the lower leg.  It will vary quite a bit depending on his activity level and he does have some swelling sometimes when he overexerts himself.  No significant history of trauma or injury.  No prior operative interventions on the affected foot.  Over-the-counter medications such as NSAIDs will help transiently for a short time.  He worked as an Clinical biochemist for many years, and he still works doing some Water engineer work, and he is spent most of his career on his feet.  Past Medical History, Surgical History, Social History, Family History, Problem List, Medications, and Allergies have been reviewed and updated if relevant.  Patient Active Problem List   Diagnosis Date Noted  . Left foot pain 11/15/2015  . 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 Arthralgia 03/22/2015  . Hypertensive retinopathy of both eyes, grade 1   . Advanced care planning/counseling discussion 08/10/2014  . Bilateral hearing loss due to cerumen impaction 08/10/2014  . Tremor 09/09/2012  . Medicare annual wellness visit, subsequent 08/07/2011  . History of colonic polyps   . Diverticulosis of colon   . HTN (hypertension)   . HLD (hyperlipidemia)   .  Prediabetes 08/11/2010  . Vitamin D deficiency 08/07/2010  . 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 History of colonic polyps   . Prediabetes   . Diverticulosis of colon   . HTN (hypertension)   . HLD (hyperlipidemia)   . Hypertensive retinopathy of both eyes, grade 1     [name]  . Obesity, Class I, BMI 30-34.9     Past Surgical History  Procedure Laterality Date  . Umbilical hernia repair  01/07/04  . Colonoscopy  11/2012    mild-mod diverticulosis, int hem, rec rpt 5 yrs [facility] Plan)    Social History   Social History  . Marital Status: Married    Spouse Name: N/A  . Number of Children: 3  . Years of Education: N/A   Occupational History  . 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 Smoking status: Former Smoker    Quit date: 11/10/1984  . Smokeless tobacco: Never Used  . Alcohol Use: 0.0 oz/week    0 Standard drinks or equivalent per week     Comment: occasional  . Drug Use: No  . Sexual Activity: Not on file   Other Topics Concern  . Not on file   Social History Narrative   Lives with wife   Occupation: retired, was Clinical biochemist   Activity: Electrical engineer, tries walking 3x/wk   Diet: good water, fruits/vegetables daily    Family History  Problem Relation Age of Onset  . Hypertension Sister   .  Colon cancer Sister 66  . Hyperlipidemia Sister   . Colon polyps Sister   . Hypertension Sister   . Hyperlipidemia Sister   . Cancer Sister 16    colon-polyp  . Hypertension Sister   . Hyperlipidemia Sister   . Heart attack Father 53    No Known Allergies  Medication list reviewed and updated in full in [place].  GEN: No fevers, chills. Nontoxic. Primarily MSK c/o today. MSK: Detailed in the HPI GI: tolerating PO intake without difficulty Neuro: No numbness, parasthesias, or tingling associated. Otherwise the pertinent positives of the ROS are  noted above.   Objective:   BP 140/80 mmHg  Temp(Src) 97.6 F (36.4 C) (Oral)  Ht '5\' 8"'$  (1.727 m)  Wt 216 lb (97.977 kg)  BMI 32.85 kg/m2   GEN: WDWN, NAD, Non-toxic, Alert & Oriented x 3 HEENT: Atraumatic, Normocephalic.  Ears and Nose: No external deformity. EXTR: No clubbing/cyanosis/edema PSYCH: Normally interactive. Conversant. Not depressed or anxious appearing.  Calm demeanor.   FEET: B Echymosis: mild L medial foot edema ROM: full LE B - minimal restriction only Gait: heel toe, non-antalgic - pronation, outturned L foot MT pain: no Callus pattern: none Lateral Mall: NT Medial Mall: NT Talus: NT Navicular: NT Cuboid: NT Calcaneous: NT Metatarsals: NT 5th MT: NT Phalanges: NT Achilles: NT Plantar Fascia: NT Fat Pad: NT Peroneals: NT Post Tib: TENDER THROUGHOUT ITS LENGTH FROM INSERTION THROUGH LE Great Toe: Nml motion Ant Drawer: neg ATFL: NT CFL: NT Deltoid: NT Other foot breakdown: none Long arch: SINGNIFICANT PES PLANUS Transverse arch: MODERATE ARCH COLLAPSE WITH MILD BUNION FORMATION Hindfoot breakdown: none Sensation: intact   Radiology: No results found.  Assessment and Plan:   Posterior tibial tendinitis of left leg  Left foot pain  Pes planus of both feet  >40 minutes spent in face to face time with patient, >50% spent in counselling or coordination of care   I am hopeful that if we can unload his posterior tibialis tendon and he will have decreased pain and decreased swelling in this region.  He has some good Brooks walking shoes, and I think that the custom foot-tech orthotics system that we use will likely help some of his inherent biomechanical changes of his feet.  Patient was fitted for a standard, cushioned, semi-rigid orthotic.  The orthotic was heated and the patient stood on the orthotic blank positioned on the orthotic stand. The patient was positioned in subtalar neutral position and 10 degrees of ankle dorsiflexion in a  weight bearing stance. After molding, a stable Fast-Tech EVA base was applied to the orthotic blank.   The blank was ground to a stable position for weight bearing. Size: 11 base: Black F4 posting: none additional orthotic padding: none   Follow-up: No Follow-up on file.  Signed,  Maud Deed. Erikson Danzy, MD   Patient's Medications  New Prescriptions   No medications on file  Previous Medications   ASPIRIN 81 MG TABLET    Take 81 mg by mouth daily.     ATENOLOL (TENORMIN) 50 MG TABLET    Take 50 mg by mouth daily.    ATORVASTATIN (LIPITOR) 80 MG TABLET    Take 1 tablet (80 mg total) by mouth at bedtime.   CHLORTHALIDONE (HYGROTON) 25 MG TABLET    Take 1 tablet (25 mg total) by mouth daily.   CHOLECALCIFEROL (VITAMIN D) 1000 UNITS TABLET    Take 1,000 Units by mouth daily.     ENALAPRIL (VASOTEC)  10 MG TABLET    Take 1 tablet (10 mg total) by mouth 2 (two) times daily.   SILDENAFIL (VIAGRA) 100 MG TABLET    Take 100 mg by mouth daily as needed.    Modified Medications   No medications on file  Discontinued Medications   No medications on file

## 2015-12-16 ENCOUNTER — Other Ambulatory Visit: Payer: Self-pay | Admitting: Family Medicine

## 2016-01-18 ENCOUNTER — Other Ambulatory Visit: Payer: Self-pay | Admitting: Family Medicine

## 2016-01-20 DIAGNOSIS — H35033 Hypertensive retinopathy, bilateral: Secondary | ICD-10-CM | POA: Diagnosis not present

## 2016-01-20 DIAGNOSIS — H25813 Combined forms of age-related cataract, bilateral: Secondary | ICD-10-CM | POA: Diagnosis not present

## 2016-01-20 DIAGNOSIS — H01009 Unspecified blepharitis unspecified eye, unspecified eyelid: Secondary | ICD-10-CM | POA: Diagnosis not present

## 2016-01-20 DIAGNOSIS — I10 Essential (primary) hypertension: Secondary | ICD-10-CM | POA: Diagnosis not present

## 2016-06-26 ENCOUNTER — Ambulatory Visit (INDEPENDENT_AMBULATORY_CARE_PROVIDER_SITE_OTHER): Payer: PPO

## 2016-06-26 DIAGNOSIS — Z23 Encounter for immunization: Secondary | ICD-10-CM

## 2016-07-15 ENCOUNTER — Other Ambulatory Visit: Payer: Self-pay | Admitting: Family Medicine

## 2016-07-18 ENCOUNTER — Other Ambulatory Visit: Payer: Self-pay | Admitting: Family Medicine

## 2016-08-23 ENCOUNTER — Other Ambulatory Visit: Payer: Self-pay | Admitting: Family Medicine

## 2016-08-23 DIAGNOSIS — E785 Hyperlipidemia, unspecified: Secondary | ICD-10-CM

## 2016-08-23 DIAGNOSIS — E559 Vitamin D deficiency, unspecified: Secondary | ICD-10-CM

## 2016-08-23 DIAGNOSIS — Z125 Encounter for screening for malignant neoplasm of prostate: Secondary | ICD-10-CM

## 2016-08-23 DIAGNOSIS — R7303 Prediabetes: Secondary | ICD-10-CM

## 2016-08-26 ENCOUNTER — Other Ambulatory Visit: Payer: Medicare Other

## 2016-08-27 ENCOUNTER — Ambulatory Visit (INDEPENDENT_AMBULATORY_CARE_PROVIDER_SITE_OTHER): Payer: PPO

## 2016-08-27 VITALS — BP 132/80 | HR 58 | Temp 97.7°F | Ht 68.0 in | Wt 211.2 lb

## 2016-08-27 DIAGNOSIS — Z0000 Encounter for general adult medical examination without abnormal findings: Secondary | ICD-10-CM | POA: Diagnosis not present

## 2016-08-27 DIAGNOSIS — Z125 Encounter for screening for malignant neoplasm of prostate: Secondary | ICD-10-CM

## 2016-08-27 DIAGNOSIS — E559 Vitamin D deficiency, unspecified: Secondary | ICD-10-CM | POA: Diagnosis not present

## 2016-08-27 DIAGNOSIS — R7303 Prediabetes: Secondary | ICD-10-CM

## 2016-08-27 DIAGNOSIS — E785 Hyperlipidemia, unspecified: Secondary | ICD-10-CM

## 2016-08-27 LAB — BASIC METABOLIC PANEL
BUN: 20 mg/dL (ref 6–23)
CHLORIDE: 96 meq/L (ref 96–112)
CO2: 32 meq/L (ref 19–32)
CREATININE: 0.84 mg/dL (ref 0.40–1.50)
Calcium: 10.4 mg/dL (ref 8.4–10.5)
GFR: 94.98 mL/min (ref >60.00)
Glucose, Bld: 88 mg/dL (ref 70–99)
Potassium: 3.9 mEq/L (ref 3.5–5.1)
Sodium: 137 mEq/L (ref 135–145)

## 2016-08-27 LAB — LIPID PANEL
CHOL/HDL RATIO: 4
Cholesterol: 157 mg/dL (ref 0–200)
HDL: 42.5 mg/dL (ref >39.00)
LDL CALC: 93 mg/dL (ref 0–99)
NONHDL: 114.51
TRIGLYCERIDES: 108 mg/dL (ref 0.0–149.0)
VLDL: 21.6 mg/dL (ref 0.0–40.0)

## 2016-08-27 LAB — VITAMIN D 25 HYDROXY (VIT D DEFICIENCY, FRACTURES): VITD: 30.57 ng/mL (ref 30.00–100.00)

## 2016-08-27 LAB — HEMOGLOBIN A1C: Hgb A1c MFr Bld: 6.3 % (ref 4.6–6.5)

## 2016-08-27 LAB — PSA, MEDICARE: PSA: 4.27 ng/ml — ABNORMAL HIGH (ref 0.10–4.00)

## 2016-08-27 NOTE — Progress Notes (Signed)
PCP notes:   Health maintenance:  No gaps identified. Health maintenance schedule reviewed with patient.   Abnormal screenings:   Hearing - failed (right ear)  Patient concerns:   Pt reports increased frequency of urination at night. Pt feels this issue may be caused by his medication and wants to discuss medications with PCP at next appt.  Nurse concerns:  None  Next PCP appt:   12/19 @ 1545

## 2016-08-27 NOTE — Progress Notes (Signed)
Subjective:   Blake Peterson is a 75 y.o. male who presents for Medicare Annual/Subsequent preventive examination.  Review of Systems:  N/A Cardiac Risk Factors include: advanced age (>81mn, >>47women);male gender;obesity (BMI >30kg/m2);dyslipidemia;hypertension     Objective:    Vitals: BP 132/80 (BP Location: Left Arm, Patient Position: Sitting, Cuff Size: Normal)   Pulse (!) 58   Temp 97.7 F (36.5 C) (Oral)   Ht '5\' 8"'$  (1.727 m) Comment: no shoes  Wt 211 lb 4 oz (95.8 kg)   SpO2 98%   BMI 32.12 kg/m   Body mass index is 32.12 kg/m.  Tobacco History  Smoking Status  . Former Smoker  . Quit date: 11/10/1984  Smokeless Tobacco  . Never Used     Counseling given: No   Past Medical History:  Diagnosis Date  . Diverticulosis of colon   . History of colonic polyps   . HLD (hyperlipidemia)   . HTN (hypertension)   . Hypertensive retinopathy of both eyes, grade 1    [name]  . 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 Prediabetes    Past Surgical History:  Procedure Laterality Date  . COLONOSCOPY  11/2012   mild-mod diverticulosis, int hem, rec rpt 5 yrs ([facility]  . UMBILICAL HERNIA REPAIR  01/07/04   Family History  Problem Relation Age of Onset  . Hypertension Sister   . Colon cancer Sister 581 . Heart attack Father 634 . Hyperlipidemia Sister   . Colon polyps Sister   . Hypertension Sister   . Hyperlipidemia Sister   . Cancer Sister 611   colon-polyp  . Hypertension Sister   . Hyperlipidemia Sister    History  Sexual Activity  . Sexual activity: No    Outpatient Encounter Prescriptions as of 08/27/2016  Medication Sig  . aspirin 81 MG tablet Take 81 mg by mouth daily.    .Marland Kitchenatenolol (TENORMIN) 50 MG tablet TAKE 1 TABLET (50 MG TOTAL) BY MOUTH DAILY.  .Marland Kitchenatorvastatin (LIPITOR) 80 MG tablet Take 1 tablet (80 mg total) by mouth at bedtime.  . chlorthalidone (HYGROTON) 25 MG tablet TAKE 1 TABLET (25 MG TOTAL) BY MOUTH DAILY.  . cholecalciferol (VITAMIN D)  1000 UNITS tablet Take 1,000 Units by mouth daily.    . enalapril (VASOTEC) 10 MG tablet TAKE 1 TABLET BY MOUTH 2 TIMES A DAY  . sildenafil (VIAGRA) 100 MG tablet Take 100 mg by mouth daily as needed.    . [DISCONTINUED] enalapril (VASOTEC) 10 MG tablet Take 1 tablet (10 mg total) by mouth 2 (two) times daily.   No facility-administered encounter medications on file as of 08/27/2016.     Activities of Daily Living In your present state of health, do you have any difficulty performing the following activities: 08/27/2016  Hearing? N  Vision? N  Difficulty concentrating or making decisions? N  Walking or climbing stairs? N  Dressing or bathing? N  Doing errands, shopping? N  Preparing Food and eating ? N  Using the Toilet? N  In the past six months, have you accidently leaked urine? N  Do you have problems with loss of bowel control? N  Managing your Medications? N  Managing your Finances? N  Housekeeping or managing your Housekeeping? N  Some recent data might be hidden    Patient Care Team: JRia Bush MD as PCP - General NThelma Comp OD as Consulting Physician (Optometry) WHarriett Sine MD as Consulting Physician (Dermatology)   Assessment:  Hearing Screening   '125Hz'$  '250Hz'$  '500Hz'$  '1000Hz'$  '2000Hz'$  '3000Hz'$  '4000Hz'$  '6000Hz'$  '8000Hz'$   Right ear:   40 40 40  0    Left ear:           Comments: Hearing aid in left ear  Vision Screening Comments: Last vision exam in May 2017 @ [facility]   Exercise Activities and Dietary recommendations Current Exercise Habits: Home exercise routine, Type of exercise: strength training/weights;stretching;treadmill, Time (Minutes): 60, Frequency (Times/Week): 3, Weekly Exercise (Minutes/Week): 180, Intensity: Moderate, Exercise limited by: None identified  Goals    . Increase physical activity          Starting 08/27/2016, I will continue to exercise at least 60 min 3 days per week.       Fall Risk Fall Risk  08/27/2016  08/27/2015 08/10/2014 08/08/2013  Falls in the past year? No No No No   Depression Screen PHQ 2/9 Scores 08/27/2016 08/27/2015 08/10/2014 08/08/2013  PHQ - 2 Score 0 0 0 0    Cognitive Function MMSE - Mini Mental State Exam 08/27/2016  Orientation to time 5  Orientation to Place 5  Registration 3  Attention/ Calculation 0  Recall 3  Language- name 2 objects 0  Language- repeat 1  Language- follow 3 step command 3  Language- read & follow direction 0  Write a sentence 0  Copy design 0  Total score 20     PLEASE NOTE: A Mini-Cog screen was completed. Maximum score is 20. A value of 0 denotes this part of Folstein MMSE was not completed or the patient failed this part of the Mini-Cog screening.   Mini-Cog Screening Orientation to Time - Max 5 pts Orientation to Place - Max 5 pts Registration - Max 3 pts Recall - Max 3 pts Language Repeat - Max 1 pts Language Follow 3 Step Command - Max 3 pts     Immunization History  Administered Date(s) Administered  . Influenza Split 06/17/2011, 06/15/2012  . Influenza Whole 07/16/2006, 07/01/2007, 06/20/2008, 06/19/2009, 06/11/2010  . Influenza,inj,Quad PF,36+ Mos 06/13/2013, 06/20/2014, 06/05/2015, 06/26/2016  . Pneumococcal Conjugate-13 08/10/2014  . Pneumococcal Polysaccharide-23 01/30/2009  . Td 06/09/2002, 08/08/2013  . Zoster 08/08/2009   Screening Tests Health Maintenance  Topic Date Due  . DTaP/Tdap/Td (1 - Tdap) 08/09/2023 (Originally 08/09/2013)  . COLONOSCOPY  11/24/2017  . TETANUS/TDAP  08/09/2023  . INFLUENZA VACCINE  Completed  . ZOSTAVAX  Completed  . PNA vac Low Risk Adult  Completed      Plan:     I have personally reviewed and addressed the Medicare Annual Wellness questionnaire and have noted the following in the patient's chart:  A. Medical and social history B. Use of alcohol, tobacco or illicit drugs  C. Current medications and supplements D. Functional ability and status E.  Nutritional status F.    Physical activity G. Advance directives H. List of other physicians I.  Hospitalizations, surgeries, and ER visits in previous 12 months J.  [place] to include hearing, vision, cognitive, depression L. Referrals and appointments - none  In addition, I have reviewed and discussed with patient certain preventive protocols, quality metrics, and best practice recommendations. A written personalized care plan for preventive services as well as general preventive health recommendations were provided to patient.  See attached scanned questionnaire for additional information.   Signed,   Lindell Noe, MHA, BS, LPN Health Coach

## 2016-08-27 NOTE — Patient Instructions (Signed)
Blake Peterson , Thank you for taking time to come for your Medicare Wellness Visit. I appreciate your ongoing commitment to your health goals. Please review the following plan we discussed and let me know if I can assist you in the future.   These are the goals we discussed: Goals    . Increase physical activity          Starting 08/27/2016, I will continue to exercise at least 60 min 3 days per week.        This is a list of the screening recommended for you and due dates:  Health Maintenance  Topic Date Due  . DTaP/Tdap/Td vaccine (1 - Tdap) 08/09/2023*  . Colon Cancer Screening  11/24/2017  . Tetanus Vaccine  08/09/2023  . Flu Shot  Completed  . Shingles Vaccine  Completed  . Pneumonia vaccines  Completed  *Topic was postponed. The date shown is not the original due date.   Preventive Care for Adults  A healthy lifestyle and preventive care can promote health and wellness. Preventive health guidelines for adults include the following key practices.  . A routine yearly physical is a good way to check with your health care provider about your health and preventive screening. It is a chance to share any concerns and updates on your health and to receive a thorough exam.  . Visit your dentist for a routine exam and preventive care every 6 months. Brush your teeth twice a day and floss once a day. Good oral hygiene prevents tooth decay and gum disease.  . The frequency of eye exams is based on your age, health, family medical history, use  of contact lenses, and other factors. Follow your health care provider's ecommendations for frequency of eye exams.  . Eat a healthy diet. Foods like vegetables, fruits, whole grains, low-fat dairy products, and lean protein foods contain the nutrients you need without too many calories. Decrease your intake of foods high in solid fats, added sugars, and salt. Eat the right amount of calories for you. Get information about a proper diet from your  health care provider, if necessary.  . Regular physical exercise is one of the most important things you can do for your health. Most adults should get at least 150 minutes of moderate-intensity exercise (any activity that increases your heart rate and causes you to sweat) each week. In addition, most adults need muscle-strengthening exercises on 2 or more days a week.  Silver Sneakers may be a benefit available to you. To determine eligibility, you may visit the website: www.silversneakers.com or contact program at 6231991510 Mon-Fri between 8AM-8PM.   . Maintain a healthy weight. The body mass index (BMI) is a screening tool to identify possible weight problems. It provides an estimate of body fat based on height and weight. Your health care provider can find your BMI and can help you achieve or maintain a healthy weight.   For adults 20 years and older: ? A BMI below 18.5 is considered underweight. ? A BMI of 18.5 to 24.9 is normal. ? A BMI of 25 to 29.9 is considered overweight. ? A BMI of 30 and above is considered obese.   . Maintain normal blood lipids and cholesterol levels by exercising and minimizing your intake of saturated fat. Eat a balanced diet with plenty of fruit and vegetables. Blood tests for lipids and cholesterol should begin at age 67 and be repeated every 5 years. If your lipid or cholesterol levels are high, you  are over 42, or you are at high risk for heart disease, you may need your cholesterol levels checked more frequently. Ongoing high lipid and cholesterol levels should be treated with medicines if diet and exercise are not working.  . If you smoke, find out from your health care provider how to quit. If you do not use tobacco, please do not start.  . If you choose to drink alcohol, please do not consume more than 2 drinks per day. One drink is considered to be 12 ounces (355 mL) of beer, 5 ounces (148 mL) of wine, or 1.5 ounces (44 mL) of liquor.  . If you are  67-50 years old, ask your health care provider if you should take aspirin to prevent strokes.  . Use sunscreen. Apply sunscreen liberally and repeatedly throughout the day. You should seek shade when your shadow is shorter than you. Protect yourself by wearing long sleeves, pants, a wide-brimmed hat, and sunglasses year round, whenever you are outdoors.  . Once a month, do a whole body skin exam, using a mirror to look at the skin on your back. Tell your health care provider of new moles, moles that have irregular borders, moles that are larger than a pencil eraser, or moles that have changed in shape or color.

## 2016-08-27 NOTE — Progress Notes (Signed)
Pre visit review using our clinic review tool, if applicable. No additional management support is needed unless otherwise documented below in the visit note. 

## 2016-08-28 NOTE — Progress Notes (Signed)
Medical screening examination/treatment/procedure(s) were performed by a registered nurse and as supervising non-physician practitioner,  I was immediately available for consultation/collaboration.  Webb Silversmith, NP

## 2016-09-02 ENCOUNTER — Encounter: Payer: Medicare Other | Admitting: Family Medicine

## 2016-09-08 ENCOUNTER — Ambulatory Visit (INDEPENDENT_AMBULATORY_CARE_PROVIDER_SITE_OTHER): Payer: PPO | Admitting: Family Medicine

## 2016-09-08 ENCOUNTER — Encounter: Payer: Self-pay | Admitting: Family Medicine

## 2016-09-08 VITALS — BP 128/80 | HR 72 | Ht 68.0 in | Wt 208.0 lb

## 2016-09-08 DIAGNOSIS — E559 Vitamin D deficiency, unspecified: Secondary | ICD-10-CM | POA: Diagnosis not present

## 2016-09-08 DIAGNOSIS — E669 Obesity, unspecified: Secondary | ICD-10-CM

## 2016-09-08 DIAGNOSIS — I10 Essential (primary) hypertension: Secondary | ICD-10-CM | POA: Diagnosis not present

## 2016-09-08 DIAGNOSIS — E785 Hyperlipidemia, unspecified: Secondary | ICD-10-CM

## 2016-09-08 DIAGNOSIS — R7303 Prediabetes: Secondary | ICD-10-CM | POA: Diagnosis not present

## 2016-09-08 DIAGNOSIS — E66811 Obesity, class 1: Secondary | ICD-10-CM

## 2016-09-08 DIAGNOSIS — Z0000 Encounter for general adult medical examination without abnormal findings: Secondary | ICD-10-CM | POA: Diagnosis not present

## 2016-09-08 DIAGNOSIS — R972 Elevated prostate specific antigen [PSA]: Secondary | ICD-10-CM

## 2016-09-08 DIAGNOSIS — Z7189 Other specified counseling: Secondary | ICD-10-CM

## 2016-09-08 NOTE — Assessment & Plan Note (Signed)
Stable on vit D 1000 IU daily.

## 2016-09-08 NOTE — Assessment & Plan Note (Signed)
Chronic, stable. Continue current regimen. 

## 2016-09-08 NOTE — Assessment & Plan Note (Signed)
Preventative protocols reviewed and updated unless pt declined. Discussed healthy diet and lifestyle.  

## 2016-09-08 NOTE — Assessment & Plan Note (Signed)
Discussed healthy diet and lifestyle changes to affect sustainable weight loss. Congratulated on weight loss to date.

## 2016-09-08 NOTE — Assessment & Plan Note (Signed)
Chronic, stable. Continue lipitor '80mg'$  daily.

## 2016-09-08 NOTE — Assessment & Plan Note (Signed)
Advanced directives: discussed, has packet at home and he will work on this. Doesn't want prolonged life support if terminal condition. Would want wife to be HCPOA.  

## 2016-09-08 NOTE — Assessment & Plan Note (Signed)
Encouraged avoid added sugars.

## 2016-09-08 NOTE — Assessment & Plan Note (Signed)
Minimal elevation, low PSA velocity. Discussed likely causes, anticipate BPH although exam WNL. Recheck 4 months. Pt agrees with plan.

## 2016-09-08 NOTE — Progress Notes (Addendum)
BP 128/80   Pulse 72   Ht '5\' 8"'$  (1.727 m)   Wt 208 lb (94.3 kg)   SpO2 99%   BMI 31.63 kg/m    CC: CPE Subjective:    Patient ID: Blake Peterson, male    DOB: March 14, 1941, 75 y.o.   MRN: [id number]  HPI: Blake Peterson is a 75 y.o. male presenting on 09/08/2016 for Medicare Wellness   Saw Katha Cabal last week for medicare wellness visit, note reviewed.   Preventative: Colonoscopy - 11/2012 diverticulosis and hemorrhoids, no polyps , rec rpt 5 yrs [facility] Plan).  Prostate - always normal. Strong stream. Noticing increasing nocturia x1-2. Would like to continue screening for next few years. Uncle with prostate cancer.  Flu shot yearly tetanus shot - 2014 Pneumovax 2010, prevnar 2015 Shingles 2010 Advanced directives: discussed, has packet at home and he will work on this. Doesn't want prolonged life support if terminal condition. Would want wife to be HCPOA.  Seat belt use discussed Sunscreen use discussed. No changing moles on skin. Sees derm. Ex smoker (remotely) Alcohol - 1 glass wine daily  Lives with wife  Occupation: retired, was Clinical biochemist Activity: enoys golfing, Immunologist, tries walking 3x/wk Diet: good water, fruits/vegetables daily  Relevant past medical, surgical, family and social history reviewed and updated as indicated. Interim medical history since our last visit reviewed. Allergies and medications reviewed and updated. Current Outpatient Prescriptions on File Prior to Visit  Medication Sig  . aspirin 81 MG tablet Take 81 mg by mouth daily.    Marland Kitchen atenolol (TENORMIN) 50 MG tablet TAKE 1 TABLET (50 MG TOTAL) BY MOUTH DAILY.  Marland Kitchen atorvastatin (LIPITOR) 80 MG tablet Take 1 tablet (80 mg total) by mouth at bedtime.  . chlorthalidone (HYGROTON) 25 MG tablet TAKE 1 TABLET (25 MG TOTAL) BY MOUTH DAILY.  . cholecalciferol (VITAMIN D) 1000 UNITS tablet Take 1,000 Units by mouth daily.    . enalapril (VASOTEC) 10 MG tablet TAKE 1 TABLET BY MOUTH 2 TIMES A DAY  . sildenafil  (VIAGRA) 100 MG tablet Take 100 mg by mouth daily as needed.     No current facility-administered medications on file prior to visit.     Review of Systems  Constitutional: Negative for activity change, appetite change, chills, fatigue, fever and unexpected weight change.  HENT: Negative for hearing loss.   Eyes: Negative for visual disturbance.  Respiratory: Negative for cough, chest tightness, shortness of breath and wheezing.   Cardiovascular: Negative for chest pain, palpitations and leg swelling.  Gastrointestinal: Positive for constipation. Negative for abdominal distention, abdominal pain, blood in stool, diarrhea, nausea and vomiting.  Genitourinary: Negative for difficulty urinating and hematuria.  Musculoskeletal: Negative for arthralgias, myalgias and neck pain.  Skin: Negative for rash.  Neurological: Negative for dizziness, seizures, syncope and headaches.  Hematological: Negative for adenopathy. Does not bruise/bleed easily.  Psychiatric/Behavioral: Negative for dysphoric mood. The patient is not nervous/anxious.    Per HPI unless specifically indicated in ROS section     Objective:    BP 128/80   Pulse 72   Ht '5\' 8"'$  (1.727 m)   Wt 208 lb (94.3 kg)   SpO2 99%   BMI 31.63 kg/m   Wt Readings from Last 3 Encounters:  09/08/16 208 lb (94.3 kg)  08/27/16 211 lb 4 oz (95.8 kg)  11/27/15 216 lb (98 kg)    Physical Exam  Constitutional: He is oriented to person, place, and time. He appears well-developed and well-nourished. No distress.  HENT:  Head: Normocephalic and atraumatic.  Right Ear: Hearing, tympanic membrane, external ear and ear canal normal.  Left Ear: Hearing, tympanic membrane, external ear and ear canal normal.  Nose: Nose normal.  Mouth/Throat: Uvula is midline, oropharynx is clear and moist and mucous membranes are normal. No oropharyngeal exudate, posterior oropharyngeal edema or posterior oropharyngeal erythema.  Eyes: Conjunctivae and EOM are  normal. Pupils are equal, round, and reactive to light. No scleral icterus.  Neck: Normal range of motion. Neck supple. Carotid bruit is not present. No thyromegaly present.  Cardiovascular: Normal rate, regular rhythm, normal heart sounds and intact distal pulses.   No murmur heard. Pulses:      Radial pulses are 2+ on the right side, and 2+ on the left side.  Pulmonary/Chest: Effort normal and breath sounds normal. No respiratory distress. He has no wheezes. He has no rales.  Abdominal: Soft. Bowel sounds are normal. He exhibits no distension and no mass. There is no tenderness. There is no rebound and no guarding.  Genitourinary: Prostate normal. Rectal exam shows no external hemorrhoid, no internal hemorrhoid, no fissure, no mass, no tenderness and anal tone normal. Prostate is not enlarged (20gm) and not tender.  Musculoskeletal: Normal range of motion. He exhibits no edema.  Lymphadenopathy:    He has no cervical adenopathy.  Neurological: He is alert and oriented to person, place, and time.  CN grossly intact, station and gait intact  Skin: Skin is warm and dry. No rash noted.  Psychiatric: He has a normal mood and affect. His behavior is normal. Judgment and thought content normal.  Nursing note and vitals reviewed.  Results for orders placed or performed in visit on 08/27/16  Lipid panel  Result Value Ref Range   Cholesterol 157 0 - 200 mg/dL   Triglycerides 108.0 0.0 - 149.0 mg/dL   HDL 42.50 >39.00 mg/dL   VLDL 21.6 0.0 - 40.0 mg/dL   LDL Cholesterol 93 0 - 99 mg/dL   Total CHOL/HDL Ratio 4    NonHDL 114.51   Hemoglobin A1c  Result Value Ref Range   Hgb A1c MFr Bld 6.3 4.6 - 6.5 %  Basic metabolic panel  Result Value Ref Range   Sodium 137 135 - 145 mEq/L   Potassium 3.9 3.5 - 5.1 mEq/L   Chloride 96 96 - 112 mEq/L   CO2 32 19 - 32 mEq/L   Glucose, Bld 88 70 - 99 mg/dL   BUN 20 6 - 23 mg/dL   Creatinine, Ser 0.84 0.40 - 1.50 mg/dL   Calcium 10.4 8.4 - 10.5 mg/dL    GFR 94.98 >60.00 mL/min  VITAMIN D 25 Hydroxy (Vit-D Deficiency, Fractures)  Result Value Ref Range   VITD 30.57 30.00 - 100.00 ng/mL  PSA, Medicare  Result Value Ref Range   PSA 4.27 (H) 0.10 - 4.00 ng/ml      Assessment & Plan:   Problem List Items Addressed This Visit    Advanced care planning/counseling discussion    Advanced directives: discussed, has packet at home and he will work on this. Doesn't want prolonged life support if terminal condition. Would want wife to be HCPOA.       Elevated PSA    Minimal elevation, low PSA velocity. Discussed likely causes, anticipate BPH although exam WNL. Recheck 4 months. Pt agrees with plan.      Relevant Orders   PSA (Reflex To Free) (Serial)   Health maintenance examination - Primary    Preventative protocols  reviewed and updated unless pt declined. Discussed healthy diet and lifestyle.       HLD (hyperlipidemia)    Chronic, stable. Continue lipitor '80mg'$  daily.       HTN (hypertension)    Chronic, stable. Continue current regimen.       Obesity, Class I, BMI 30-34.9    Discussed healthy diet and lifestyle changes to affect sustainable weight loss. Congratulated on weight loss to date.       Prediabetes    Encouraged avoid added sugars.       Vitamin D deficiency    Stable on vit D 1000 IU daily.           Follow up plan: Return in about 1 year (around 09/08/2017) for annual exam, prior fasting for blood work.  Ria Bush, MD

## 2016-09-08 NOTE — Patient Instructions (Addendum)
Prostate level slightly elevated but exam was ok - return in 4 months to recheck PSA level (lab visit only).  Watch night time voiding, and let me know if you'd like medicine to try.  You are doing well today . Congratulations on healthy diet, activity and weight loss. Keep it up! Return as needed or in 1 year for next wellness visit   Health Maintenance, Male A healthy lifestyle and preventative care can promote health and wellness.  Maintain regular health, dental, and eye exams.  Eat a healthy diet. Foods like vegetables, fruits, whole grains, low-fat dairy products, and lean protein foods contain the nutrients you need and are low in calories. Decrease your intake of foods high in solid fats, added sugars, and salt. Get information about a proper diet from your health care provider, if necessary.  Regular physical exercise is one of the most important things you can do for your health. Most adults should get at least 150 minutes of moderate-intensity exercise (any activity that increases your heart rate and causes you to sweat) each week. In addition, most adults need muscle-strengthening exercises on 2 or more days a week.   Maintain a healthy weight. The body mass index (BMI) is a screening tool to identify possible weight problems. It provides an estimate of body fat based on height and weight. Your health care provider can find your BMI and can help you achieve or maintain a healthy weight. For males 20 years and older:  A BMI below 18.5 is considered underweight.  A BMI of 18.5 to 24.9 is normal.  A BMI of 25 to 29.9 is considered overweight.  A BMI of 30 and above is considered obese.  Maintain normal blood lipids and cholesterol by exercising and minimizing your intake of saturated fat. Eat a balanced diet with plenty of fruits and vegetables. Blood tests for lipids and cholesterol should begin at age 26 and be repeated every 5 years. If your lipid or cholesterol levels are high,  you are over age 36, or you are at high risk for heart disease, you may need your cholesterol levels checked more frequently.Ongoing high lipid and cholesterol levels should be treated with medicines if diet and exercise are not working.  If you smoke, find out from your health care provider how to quit. If you do not use tobacco, do not start.  Lung cancer screening is recommended for adults aged 41-80 years who are at high risk for developing lung cancer because of a history of smoking. A yearly low-dose CT scan of the lungs is recommended for people who have at least a 30-pack-year history of smoking and are current smokers or have quit within the past 15 years. A pack year of smoking is smoking an average of 1 pack of cigarettes a day for 1 year (for example, a 30-pack-year history of smoking could mean smoking 1 pack a day for 30 years or 2 packs a day for 15 years). Yearly screening should continue until the smoker has stopped smoking for at least 15 years. Yearly screening should be stopped for people who develop a health problem that would prevent them from having lung cancer treatment.  If you choose to drink alcohol, do not have more than 2 drinks per day. One drink is considered to be 12 oz (360 mL) of beer, 5 oz (150 mL) of wine, or 1.5 oz (45 mL) of liquor.  Avoid the use of street drugs. Do not share needles with anyone. Ask  for help if you need support or instructions about stopping the use of drugs.  High blood pressure causes heart disease and increases the risk of stroke. High blood pressure is more likely to develop in:  People who have blood pressure in the end of the normal range (100-139/85-89 mm Hg).  People who are overweight or obese.  People who are African American.  If you are 18-64 years of age, have your blood pressure checked every 3-5 years. If you are 67 years of age or older, have your blood pressure checked every year. You should have your blood pressure measured  twice-once when you are at a hospital or clinic, and once when you are not at a hospital or clinic. Record the average of the two measurements. To check your blood pressure when you are not at a hospital or clinic, you can use:  An automated blood pressure machine at a pharmacy.  A home blood pressure monitor.  If you are 45-38 years old, ask your health care provider if you should take aspirin to prevent heart disease.  Diabetes screening involves taking a blood sample to check your fasting blood sugar level. This should be done once every 3 years after age 53 if you are at a normal weight and without risk factors for diabetes. Testing should be considered at a younger age or be carried out more frequently if you are overweight and have at least 1 risk factor for diabetes.  Colorectal cancer can be detected and often prevented. Most routine colorectal cancer screening begins at the age of 63 and continues through age 18. However, your health care provider may recommend screening at an earlier age if you have risk factors for colon cancer. On a yearly basis, your health care provider may provide home test kits to check for hidden blood in the stool. A small camera at the end of a tube may be used to directly examine the colon (sigmoidoscopy or colonoscopy) to detect the earliest forms of colorectal cancer. Talk to your health care provider about this at age 17 when routine screening begins. A direct exam of the colon should be repeated every 5-10 years through age 84, unless early forms of precancerous polyps or small growths are found.  People who are at an increased risk for hepatitis B should be screened for this virus. You are considered at high risk for hepatitis B if:  You were born in a country where hepatitis B occurs often. Talk with your health care provider about which countries are considered high risk.  Your parents were born in a high-risk country and you have not received a shot to  protect against hepatitis B (hepatitis B vaccine).  You have HIV or AIDS.  You use needles to inject street drugs.  You live with, or have sex with, someone who has hepatitis B.  You are a man who has sex with other men (MSM).  You get hemodialysis treatment.  You take certain medicines for conditions like cancer, organ transplantation, and autoimmune conditions.  Hepatitis C blood testing is recommended for all people born from [birth year] through 1965 and any individual with known risk factors for hepatitis C.  Healthy men should no longer receive prostate-specific antigen (PSA) blood tests as part of routine cancer screening. Talk to your health care provider about prostate cancer screening.  Testicular cancer screening is not recommended for adolescents or adult males who have no symptoms. Screening includes self-exam, a health care provider exam, and other  screening tests. Consult with your health care provider about any symptoms you have or any concerns you have about testicular cancer.  Practice safe sex. Use condoms and avoid high-risk sexual practices to reduce the spread of sexually transmitted infections (STIs).  You should be screened for STIs, including gonorrhea and chlamydia if:  You are sexually active and are younger than 24 years.  You are older than 24 years, and your health care provider tells you that you are at risk for this type of infection.  Your sexual activity has changed since you were last screened, and you are at an increased risk for chlamydia or gonorrhea. Ask your health care provider if you are at risk.  If you are at risk of being infected with HIV, it is recommended that you take a prescription medicine daily to prevent HIV infection. This is called pre-exposure prophylaxis (PrEP). You are considered at risk if:  You are a man who has sex with other men (MSM).  You are a heterosexual man who is sexually active with multiple partners.  You take drugs by  injection.  You are sexually active with a partner who has HIV.  Talk with your health care provider about whether you are at high risk of being infected with HIV. If you choose to begin PrEP, you should first be tested for HIV. You should then be tested every 3 months for as long as you are taking PrEP.  Use sunscreen. Apply sunscreen liberally and repeatedly throughout the day. You should seek shade when your shadow is shorter than you. Protect yourself by wearing long sleeves, pants, a wide-brimmed hat, and sunglasses year round whenever you are outdoors.  Tell your health care provider of new moles or changes in moles, especially if there is a change in shape or color. Also, tell your health care provider if a mole is larger than the size of a pencil eraser.  A one-time screening for abdominal aortic aneurysm (AAA) and surgical repair of large AAAs by ultrasound is recommended for men aged 28-75 years who are current or former smokers.  Stay current with your vaccines (immunizations). This information is not intended to replace advice given to you by your health care provider. Make sure you discuss any questions you have with your health care provider. Document Released: 03/05/2008 Document Revised: 09/28/2014 Document Reviewed: 06/11/2015 Elsevier Interactive Patient Education  2017 [employer].

## 2016-10-14 ENCOUNTER — Other Ambulatory Visit: Payer: Self-pay | Admitting: Family Medicine

## 2016-12-11 ENCOUNTER — Other Ambulatory Visit: Payer: Self-pay | Admitting: Family Medicine

## 2016-12-20 DIAGNOSIS — C4491 Basal cell carcinoma of skin, unspecified: Secondary | ICD-10-CM

## 2016-12-20 HISTORY — DX: Basal cell carcinoma of skin, unspecified: C44.91

## 2017-01-01 DIAGNOSIS — L821 Other seborrheic keratosis: Secondary | ICD-10-CM | POA: Diagnosis not present

## 2017-01-01 DIAGNOSIS — L570 Actinic keratosis: Secondary | ICD-10-CM | POA: Diagnosis not present

## 2017-01-01 DIAGNOSIS — C44712 Basal cell carcinoma of skin of right lower limb, including hip: Secondary | ICD-10-CM | POA: Diagnosis not present

## 2017-01-01 DIAGNOSIS — D1801 Hemangioma of skin and subcutaneous tissue: Secondary | ICD-10-CM | POA: Diagnosis not present

## 2017-01-01 DIAGNOSIS — L820 Inflamed seborrheic keratosis: Secondary | ICD-10-CM | POA: Diagnosis not present

## 2017-01-01 DIAGNOSIS — L814 Other melanin hyperpigmentation: Secondary | ICD-10-CM | POA: Diagnosis not present

## 2017-01-07 ENCOUNTER — Other Ambulatory Visit (INDEPENDENT_AMBULATORY_CARE_PROVIDER_SITE_OTHER): Payer: PPO

## 2017-01-07 DIAGNOSIS — R972 Elevated prostate specific antigen [PSA]: Secondary | ICD-10-CM

## 2017-01-08 LAB — PSA (REFLEX TO FREE) (SERIAL): Prostate Specific Ag, Serum: 3.3 ng/mL (ref 0.0–4.0)

## 2017-01-11 ENCOUNTER — Encounter: Payer: Self-pay | Admitting: *Deleted

## 2017-01-12 ENCOUNTER — Encounter: Payer: Self-pay | Admitting: Family Medicine

## 2017-03-05 DIAGNOSIS — H52223 Regular astigmatism, bilateral: Secondary | ICD-10-CM | POA: Diagnosis not present

## 2017-03-05 DIAGNOSIS — H2513 Age-related nuclear cataract, bilateral: Secondary | ICD-10-CM | POA: Diagnosis not present

## 2017-03-05 DIAGNOSIS — H35033 Hypertensive retinopathy, bilateral: Secondary | ICD-10-CM | POA: Diagnosis not present

## 2017-03-05 DIAGNOSIS — H01025 Squamous blepharitis left lower eyelid: Secondary | ICD-10-CM | POA: Diagnosis not present

## 2017-03-05 DIAGNOSIS — I10 Essential (primary) hypertension: Secondary | ICD-10-CM | POA: Diagnosis not present

## 2017-03-05 DIAGNOSIS — H25011 Cortical age-related cataract, right eye: Secondary | ICD-10-CM | POA: Diagnosis not present

## 2017-03-05 DIAGNOSIS — H01022 Squamous blepharitis right lower eyelid: Secondary | ICD-10-CM | POA: Diagnosis not present

## 2017-03-05 DIAGNOSIS — H524 Presbyopia: Secondary | ICD-10-CM | POA: Diagnosis not present

## 2017-05-11 DIAGNOSIS — H25013 Cortical age-related cataract, bilateral: Secondary | ICD-10-CM | POA: Diagnosis not present

## 2017-05-11 DIAGNOSIS — I10 Essential (primary) hypertension: Secondary | ICD-10-CM | POA: Diagnosis not present

## 2017-05-11 DIAGNOSIS — H2511 Age-related nuclear cataract, right eye: Secondary | ICD-10-CM | POA: Diagnosis not present

## 2017-05-11 DIAGNOSIS — H2513 Age-related nuclear cataract, bilateral: Secondary | ICD-10-CM | POA: Diagnosis not present

## 2017-05-11 DIAGNOSIS — H25043 Posterior subcapsular polar age-related cataract, bilateral: Secondary | ICD-10-CM | POA: Diagnosis not present

## 2017-06-14 DIAGNOSIS — H2511 Age-related nuclear cataract, right eye: Secondary | ICD-10-CM | POA: Diagnosis not present

## 2017-06-14 DIAGNOSIS — Z961 Presence of intraocular lens: Secondary | ICD-10-CM | POA: Diagnosis not present

## 2017-06-15 DIAGNOSIS — H2512 Age-related nuclear cataract, left eye: Secondary | ICD-10-CM | POA: Diagnosis not present

## 2017-06-21 HISTORY — PX: CATARACT EXTRACTION, BILATERAL: SHX1313

## 2017-06-25 ENCOUNTER — Ambulatory Visit (INDEPENDENT_AMBULATORY_CARE_PROVIDER_SITE_OTHER): Payer: PPO

## 2017-06-25 DIAGNOSIS — Z23 Encounter for immunization: Secondary | ICD-10-CM

## 2017-07-05 DIAGNOSIS — Z961 Presence of intraocular lens: Secondary | ICD-10-CM | POA: Diagnosis not present

## 2017-07-05 DIAGNOSIS — H2512 Age-related nuclear cataract, left eye: Secondary | ICD-10-CM | POA: Diagnosis not present

## 2017-07-06 DIAGNOSIS — H2511 Age-related nuclear cataract, right eye: Secondary | ICD-10-CM | POA: Diagnosis not present

## 2017-07-16 ENCOUNTER — Other Ambulatory Visit: Payer: Self-pay | Admitting: Family Medicine

## 2017-07-22 LAB — HM DIABETES EYE EXAM

## 2017-09-05 ENCOUNTER — Other Ambulatory Visit: Payer: Self-pay | Admitting: Family Medicine

## 2017-09-05 DIAGNOSIS — R972 Elevated prostate specific antigen [PSA]: Secondary | ICD-10-CM

## 2017-09-05 DIAGNOSIS — M2550 Pain in unspecified joint: Secondary | ICD-10-CM

## 2017-09-05 DIAGNOSIS — E785 Hyperlipidemia, unspecified: Secondary | ICD-10-CM

## 2017-09-05 DIAGNOSIS — R7303 Prediabetes: Secondary | ICD-10-CM

## 2017-09-05 DIAGNOSIS — E559 Vitamin D deficiency, unspecified: Secondary | ICD-10-CM

## 2017-09-07 ENCOUNTER — Other Ambulatory Visit (INDEPENDENT_AMBULATORY_CARE_PROVIDER_SITE_OTHER): Payer: PPO

## 2017-09-07 DIAGNOSIS — R972 Elevated prostate specific antigen [PSA]: Secondary | ICD-10-CM

## 2017-09-07 DIAGNOSIS — M2550 Pain in unspecified joint: Secondary | ICD-10-CM

## 2017-09-07 DIAGNOSIS — E559 Vitamin D deficiency, unspecified: Secondary | ICD-10-CM | POA: Diagnosis not present

## 2017-09-07 DIAGNOSIS — E785 Hyperlipidemia, unspecified: Secondary | ICD-10-CM

## 2017-09-07 DIAGNOSIS — R7303 Prediabetes: Secondary | ICD-10-CM

## 2017-09-07 LAB — LIPID PANEL
CHOL/HDL RATIO: 3
Cholesterol: 135 mg/dL (ref 0–200)
HDL: 44.6 mg/dL (ref >39.00)
LDL Cholesterol: 71 mg/dL (ref 0–99)
NONHDL: 90.33
Triglycerides: 95 mg/dL (ref 0.0–149.0)
VLDL: 19 mg/dL (ref 0.0–40.0)

## 2017-09-07 LAB — BASIC METABOLIC PANEL
BUN: 19 mg/dL (ref 6–23)
CHLORIDE: 95 meq/L — AB (ref 96–112)
CO2: 31 meq/L (ref 19–32)
CREATININE: 0.9 mg/dL (ref 0.40–1.50)
Calcium: 9.7 mg/dL (ref 8.4–10.5)
GFR: 87.46 mL/min (ref >60.00)
GLUCOSE: 125 mg/dL — AB (ref 70–99)
Potassium: 3.8 mEq/L (ref 3.5–5.1)
Sodium: 135 mEq/L (ref 135–145)

## 2017-09-07 LAB — CBC WITH DIFFERENTIAL/PLATELET
BASOS ABS: 0.1 10*3/uL (ref 0.0–0.1)
Basophils Relative: 0.7 % (ref 0.0–3.0)
EOS ABS: 0.1 10*3/uL (ref 0.0–0.7)
Eosinophils Relative: 0.6 % (ref 0.0–5.0)
HEMATOCRIT: 46.7 % (ref 39.0–52.0)
HEMOGLOBIN: 15.9 g/dL (ref 13.0–17.0)
LYMPHS PCT: 21.2 % (ref 12.0–46.0)
Lymphs Abs: 2.2 10*3/uL (ref 0.7–4.0)
MCHC: 34.1 g/dL (ref 30.0–36.0)
MCV: 91.4 fl (ref 78.0–100.0)
Monocytes Absolute: 1.2 10*3/uL — ABNORMAL HIGH (ref 0.1–1.0)
Monocytes Relative: 11.4 % (ref 3.0–12.0)
NEUTROS ABS: 6.8 10*3/uL (ref 1.4–7.7)
Neutrophils Relative %: 66.1 % (ref 43.0–77.0)
PLATELETS: 247 10*3/uL (ref 150.0–400.0)
RBC: 5.11 Mil/uL (ref 4.22–5.81)
RDW: 13 % (ref 11.5–15.5)
WBC: 10.2 10*3/uL (ref 4.0–10.5)

## 2017-09-07 LAB — VITAMIN D 25 HYDROXY (VIT D DEFICIENCY, FRACTURES): VITD: 33.83 ng/mL (ref 30.00–100.00)

## 2017-09-07 LAB — PSA: PSA: 3.73 ng/mL (ref 0.10–4.00)

## 2017-09-07 LAB — HEMOGLOBIN A1C: Hgb A1c MFr Bld: 6.9 % — ABNORMAL HIGH (ref 4.6–6.5)

## 2017-09-10 ENCOUNTER — Ambulatory Visit (INDEPENDENT_AMBULATORY_CARE_PROVIDER_SITE_OTHER): Payer: PPO | Admitting: Family Medicine

## 2017-09-10 ENCOUNTER — Encounter: Payer: Self-pay | Admitting: Family Medicine

## 2017-09-10 VITALS — BP 120/80 | HR 63 | Temp 98.2°F | Ht 68.0 in | Wt 214.0 lb

## 2017-09-10 DIAGNOSIS — R972 Elevated prostate specific antigen [PSA]: Secondary | ICD-10-CM | POA: Diagnosis not present

## 2017-09-10 DIAGNOSIS — E669 Obesity, unspecified: Secondary | ICD-10-CM

## 2017-09-10 DIAGNOSIS — Z7189 Other specified counseling: Secondary | ICD-10-CM

## 2017-09-10 DIAGNOSIS — E119 Type 2 diabetes mellitus without complications: Secondary | ICD-10-CM | POA: Diagnosis not present

## 2017-09-10 DIAGNOSIS — I10 Essential (primary) hypertension: Secondary | ICD-10-CM | POA: Diagnosis not present

## 2017-09-10 DIAGNOSIS — Z0000 Encounter for general adult medical examination without abnormal findings: Secondary | ICD-10-CM | POA: Diagnosis not present

## 2017-09-10 DIAGNOSIS — E785 Hyperlipidemia, unspecified: Secondary | ICD-10-CM | POA: Diagnosis not present

## 2017-09-10 NOTE — Assessment & Plan Note (Signed)
Discussed healthy diet and lifestyle changes to affect sustainable weight loss  

## 2017-09-10 NOTE — Assessment & Plan Note (Signed)
This has normalized this year. DRE reassuring.

## 2017-09-10 NOTE — Assessment & Plan Note (Signed)
Chronic, stable. Continue current regimen. 

## 2017-09-10 NOTE — Assessment & Plan Note (Signed)
Advanced directives: discussed, has packet at home and he will work on this. Doesn't want prolonged life support if terminal condition. Would want wife to be HCPOA.

## 2017-09-10 NOTE — Assessment & Plan Note (Signed)
Preventative protocols reviewed and updated unless pt declined. Discussed healthy diet and lifestyle.  

## 2017-09-10 NOTE — Patient Instructions (Addendum)
If interested, check with pharmacy about new 2 shot shingles series (shingrix).  Keep working on living will.  Sugar levels crept up. Work on low sugar in diet, work on weight loss. Return in 6 months for sugar check.   Diabetes Mellitus and Nutrition When you have diabetes (diabetes mellitus), it is very important to have healthy eating habits because your blood sugar (glucose) levels are greatly affected by what you eat and drink. Eating healthy foods in the appropriate amounts, at about the same times every day, can help you:  Control your blood glucose.  Lower your risk of heart disease.  Improve your blood pressure.  Reach or maintain a healthy weight.  Every person with diabetes is different, and each person has different needs for a meal plan. Your health care provider may recommend that you work with a diet and nutrition specialist (dietitian) to make a meal plan that is best for you. Your meal plan may vary depending on factors such as:  The calories you need.  The medicines you take.  Your weight.  Your blood glucose, blood pressure, and cholesterol levels.  Your activity level.  Other health conditions you have, such as heart or kidney disease.  How do carbohydrates affect me? Carbohydrates affect your blood glucose level more than any other type of food. Eating carbohydrates naturally increases the amount of glucose in your blood. Carbohydrate counting is a method for keeping track of how many carbohydrates you eat. Counting carbohydrates is important to keep your blood glucose at a healthy level, especially if you use insulin or take certain oral diabetes medicines. It is important to know how many carbohydrates you can safely have in each meal. This is different for every person. Your dietitian can help you calculate how many carbohydrates you should have at each meal and for snack. Foods that contain carbohydrates include:  Bread, cereal, rice, pasta, and  crackers.  Potatoes and corn.  Peas, beans, and lentils.  Milk and yogurt.  Fruit and juice.  Desserts, such as cakes, cookies, ice cream, and candy.  How does alcohol affect me? Alcohol can cause a sudden decrease in blood glucose (hypoglycemia), especially if you use insulin or take certain oral diabetes medicines. Hypoglycemia can be a life-threatening condition. Symptoms of hypoglycemia (sleepiness, dizziness, and confusion) are similar to symptoms of having too much alcohol. If your health care provider says that alcohol is safe for you, follow these guidelines:  Limit alcohol intake to no more than 1 drink per day for nonpregnant women and 2 drinks per day for men. One drink equals 12 oz of beer, 5 oz of wine, or 1 oz of hard liquor.  Do not drink on an empty stomach.  Keep yourself hydrated with water, diet soda, or unsweetened iced tea.  Keep in mind that regular soda, juice, and other mixers may contain a lot of sugar and must be counted as carbohydrates.  What are tips for following this plan? Reading food labels  Start by checking the serving size on the label. The amount of calories, carbohydrates, fats, and other nutrients listed on the label are based on one serving of the food. Many foods contain more than one serving per package.  Check the total grams (g) of carbohydrates in one serving. You can calculate the number of servings of carbohydrates in one serving by dividing the total carbohydrates by 15. For example, if a food has 30 g of total carbohydrates, it would be equal to 2 servings  of carbohydrates.  Check the number of grams (g) of saturated and trans fats in one serving. Choose foods that have low or no amount of these fats.  Check the number of milligrams (mg) of sodium in one serving. Most people should limit total sodium intake to less than 2,300 mg per day.  Always check the nutrition information of foods labeled as "low-fat" or "nonfat". These foods  may be higher in added sugar or refined carbohydrates and should be avoided.  Talk to your dietitian to identify your daily goals for nutrients listed on the label. Shopping  Avoid buying canned, premade, or processed foods. These foods tend to be high in fat, sodium, and added sugar.  Shop around the outside edge of the grocery store. This includes fresh fruits and vegetables, bulk grains, fresh meats, and fresh dairy. Cooking  Use low-heat cooking methods, such as baking, instead of high-heat cooking methods like deep frying.  Cook using healthy oils, such as olive, canola, or sunflower oil.  Avoid cooking with butter, cream, or high-fat meats. Meal planning  Eat meals and snacks regularly, preferably at the same times every day. Avoid going long periods of time without eating.  Eat foods high in fiber, such as fresh fruits, vegetables, beans, and whole grains. Talk to your dietitian about how many servings of carbohydrates you can eat at each meal.  Eat 4-6 ounces of lean protein each day, such as lean meat, chicken, fish, eggs, or tofu. 1 ounce is equal to 1 ounce of meat, chicken, or fish, 1 egg, or 1/4 cup of tofu.  Eat some foods each day that contain healthy fats, such as avocado, nuts, seeds, and fish. Lifestyle   Check your blood glucose regularly.  Exercise at least 30 minutes 5 or more days each week, or as told by your health care provider.  Take medicines as told by your health care provider.  Do not use any products that contain nicotine or tobacco, such as cigarettes and e-cigarettes. If you need help quitting, ask your health care provider.  Work with a Social worker or diabetes educator to identify strategies to manage stress and any emotional and social challenges. What are some questions to ask my health care provider?  Do I need to meet with a diabetes educator?  Do I need to meet with a dietitian?  What number can I call if I have questions?  When are the  best times to check my blood glucose? Where to find more information:  American Diabetes Association: diabetes.org/food-and-fitness/food  Academy of Nutrition and Dietetics: PokerClues.dk  [employer] of Diabetes and Digestive and Kidney Diseases (NIH): ContactWire.be Summary  A healthy meal plan will help you control your blood glucose and maintain a healthy lifestyle.  Working with a diet and nutrition specialist (dietitian) can help you make a meal plan that is best for you.  Keep in mind that carbohydrates and alcohol have immediate effects on your blood glucose levels. It is important to count carbohydrates and to use alcohol carefully. This information is not intended to replace advice given to you by your health care provider. Make sure you discuss any questions you have with your health care provider. Document Released: 06/04/2005 Document Revised: 10/12/2016 Document Reviewed: 10/12/2016 Elsevier Interactive Patient Education  2018 [place] Maintenance, Male A healthy lifestyle and preventive care is important for your health and wellness. Ask your health care provider about what schedule of regular examinations is right for you. What should I  know about weight and diet? Eat a Healthy Diet  Eat plenty of vegetables, fruits, whole grains, low-fat dairy products, and lean protein.  Do not eat a lot of foods high in solid fats, added sugars, or salt.  Maintain a Healthy Weight Regular exercise can help you achieve or maintain a healthy weight. You should:  Do at least 150 minutes of exercise each week. The exercise should increase your heart rate and make you sweat (moderate-intensity exercise).  Do strength-training exercises at least twice a week.  Watch Your Levels of Cholesterol and Blood Lipids  Have your blood tested for  lipids and cholesterol every 5 years starting at 74 years of age. If you are at high risk for heart disease, you should start having your blood tested when you are 74 years old. You may need to have your cholesterol levels checked more often if: ? Your lipid or cholesterol levels are high. ? You are older than 74 years of age. ? You are at high risk for heart disease.  What should I know about cancer screening? Many types of cancers can be detected early and may often be prevented. Lung Cancer  You should be screened every year for lung cancer if: ? You are a current smoker who has smoked for at least 30 years. ? You are a former smoker who has quit within the past 15 years.  Talk to your health care provider about your screening options, when you should start screening, and how often you should be screened.  Colorectal Cancer  Routine colorectal cancer screening usually begins at 74 years of age and should be repeated every 5-10 years until you are 74 years old. You may need to be screened more often if early forms of precancerous polyps or small growths are found. Your health care provider may recommend screening at an earlier age if you have risk factors for colon cancer.  Your health care provider may recommend using home test kits to check for hidden blood in the stool.  A small camera at the end of a tube can be used to examine your colon (sigmoidoscopy or colonoscopy). This checks for the earliest forms of colorectal cancer.  Prostate and Testicular Cancer  Depending on your age and overall health, your health care provider may do certain tests to screen for prostate and testicular cancer.  Talk to your health care provider about any symptoms or concerns you have about testicular or prostate cancer.  Skin Cancer  Check your skin from head to toe regularly.  Tell your health care provider about any new moles or changes in moles, especially if: ? There is a change in a mole's  size, shape, or color. ? You have a mole that is larger than a pencil eraser.  Always use sunscreen. Apply sunscreen liberally and repeat throughout the day.  Protect yourself by wearing long sleeves, pants, a wide-brimmed hat, and sunglasses when outside.  What should I know about heart disease, diabetes, and high blood pressure?  If you are 78-23 years of age, have your blood pressure checked every 3-5 years. If you are 3 years of age or older, have your blood pressure checked every year. You should have your blood pressure measured twice-once when you are at a hospital or clinic, and once when you are not at a hospital or clinic. Record the average of the two measurements. To check your blood pressure when you are not at a hospital or clinic, you can use: ?  An automated blood pressure machine at a pharmacy. ? A home blood pressure monitor.  Talk to your health care provider about your target blood pressure.  If you are between 90-68 years old, ask your health care provider if you should take aspirin to prevent heart disease.  Have regular diabetes screenings by checking your fasting blood sugar level. ? If you are at a normal weight and have a low risk for diabetes, have this test once every three years after the age of 48. ? If you are overweight and have a high risk for diabetes, consider being tested at a younger age or more often.  A one-time screening for abdominal aortic aneurysm (AAA) by ultrasound is recommended for men aged 52-75 years who are current or former smokers. What should I know about preventing infection? Hepatitis B If you have a higher risk for hepatitis B, you should be screened for this virus. Talk with your health care provider to find out if you are at risk for hepatitis B infection. Hepatitis C Blood testing is recommended for:  Everyone born from [birth year] through 1963.  Anyone with known risk factors for hepatitis C.  Sexually Transmitted Diseases  (STDs)  You should be screened each year for STDs including gonorrhea and chlamydia if: ? You are sexually active and are younger than 74 years of age. ? You are older than 74 years of age and your health care provider tells you that you are at risk for this type of infection. ? Your sexual activity has changed since you were last screened and you are at an increased risk for chlamydia or gonorrhea. Ask your health care provider if you are at risk.  Talk with your health care provider about whether you are at high risk of being infected with HIV. Your health care provider may recommend a prescription medicine to help prevent HIV infection.  What else can I do?  Schedule regular health, dental, and eye exams.  Stay current with your vaccines (immunizations).  Do not use any tobacco products, such as cigarettes, chewing tobacco, and e-cigarettes. If you need help quitting, ask your health care provider.  Limit alcohol intake to no more than 2 drinks per day. One drink equals 12 ounces of beer, 5 ounces of wine, or 1 ounces of hard liquor.  Do not use street drugs.  Do not share needles.  Ask your health care provider for help if you need support or information about quitting drugs.  Tell your health care provider if you often feel depressed.  Tell your health care provider if you have ever been abused or do not feel safe at home. This information is not intended to replace advice given to you by your health care provider. Make sure you discuss any questions you have with your health care provider. Document Released: 03/05/2008 Document Revised: 05/06/2016 Document Reviewed: 06/11/2015 Elsevier Interactive Patient Education  [employer].

## 2017-09-10 NOTE — Assessment & Plan Note (Signed)
Reviewed A1c and new development of diabetes over the past year. Recommended diabetic diet as well as weight loss and sooner f/u in 6 months for DM f/u. Pt agrees with plan.

## 2017-09-10 NOTE — Progress Notes (Signed)
BP 120/80 (BP Location: Left Arm, Patient Position: Sitting, Cuff Size: Normal)   Pulse 63   Temp 98.2 F (36.8 C) (Oral)   Ht 5\' 8"  (1.727 m)   Wt 214 lb (97.1 kg)   SpO2 96%   BMI 32.54 kg/m    CC: CPE Subjective:    Patient ID: EKIN PILAR, male    DOB: 08/30/1941, 76 y.o.   MRN: [id number]  HPI: GLENDEN ROSSELL is a 76 y.o. male presenting on 09/10/2017 for Annual Exam   Did not see Katha Cabal this year.  Preventative: Colonoscopy - 11/2012 diverticulosis and hemorrhoids, no polyps , rec rpt 5 yrs [facility]).  Prostate - always normal. Strong stream. Noticing increasing nocturia x1-2. Would like to continue screening for next few years. Uncle with prostate cancer.  Flu shot yearly tetanus shot - 2014 Pneumovax 2010, prevnar 2015 zostavax 2010 shingrix - discussed Advanced directives: discussed, has packet at home and he will work on this. Doesn't want prolonged life support if terminal condition. Would want wife to be HCPOA.  Seat belt use discussed Sunscreen use discussed. No changing moles on skin. Sees derm.  Ex smoker (remotely) Alcohol - 1 glass wine 4-8oz daily  Lives with wife  Occupation: retired, was Clinical biochemist Activity: enoys golfing, Immunologist, tries walking 3x/wk Diet: good water, fruits/vegetables daily  Relevant past medical, surgical, family and social history reviewed and updated as indicated. Interim medical history since our last visit reviewed. Allergies and medications reviewed and updated. Outpatient Medications Prior to Visit  Medication Sig Dispense Refill  . aspirin 81 MG tablet Take 81 mg by mouth daily.      Marland Kitchen atenolol (TENORMIN) 50 MG tablet TAKE 1 TABLET (50 MG TOTAL) BY MOUTH DAILY. 90 tablet 3  . atorvastatin (LIPITOR) 80 MG tablet TAKE 1 TABLET (80 MG TOTAL) BY MOUTH AT BEDTIME. 90 tablet 0  . chlorthalidone (HYGROTON) 25 MG tablet TAKE 1 TABLET (25 MG TOTAL) BY MOUTH DAILY. 90 tablet 3  . cholecalciferol (VITAMIN D) 1000 UNITS tablet  Take 1,000 Units by mouth daily.      . enalapril (VASOTEC) 10 MG tablet TAKE 1 TABLET (10 MG TOTAL) BY MOUTH 2 (TWO) TIMES DAILY. 180 tablet 0  . sildenafil (VIAGRA) 100 MG tablet Take 100 mg by mouth daily as needed.      . enalapril (VASOTEC) 10 MG tablet TAKE 1 TABLET BY MOUTH 2 TIMES A DAY 60 tablet 11   No facility-administered medications prior to visit.      Per HPI unless specifically indicated in ROS section below Review of Systems  Constitutional: Negative for activity change, appetite change, chills, fatigue, fever and unexpected weight change.  HENT: Positive for hearing loss (wears hearing aide).   Eyes: Negative for visual disturbance.  Respiratory: Negative for cough, chest tightness, shortness of breath and wheezing.   Cardiovascular: Negative for chest pain, palpitations and leg swelling.  Gastrointestinal: Negative for abdominal distention, abdominal pain, blood in stool, constipation, diarrhea, nausea and vomiting.  Genitourinary: Negative for difficulty urinating and hematuria.  Musculoskeletal: Negative for arthralgias, myalgias and neck pain.  Skin: Negative for rash.  Neurological: Negative for dizziness, seizures, syncope and headaches.  Hematological: Negative for adenopathy. Does not bruise/bleed easily.  Psychiatric/Behavioral: Negative for dysphoric mood. The patient is not nervous/anxious.        Objective:    BP 120/80 (BP Location: Left Arm, Patient Position: Sitting, Cuff Size: Normal)   Pulse 63   Temp 98.2 F (36.8  C) (Oral)   Ht 5\' 8"  (1.727 m)   Wt 214 lb (97.1 kg)   SpO2 96%   BMI 32.54 kg/m   Wt Readings from Last 3 Encounters:  09/10/17 214 lb (97.1 kg)  09/08/16 208 lb (94.3 kg)  08/27/16 211 lb 4 oz (95.8 kg)    Physical Exam  Constitutional: He is oriented to person, place, and time. He appears well-developed and well-nourished. No distress.  HENT:  Head: Normocephalic and atraumatic.  Right Ear: Hearing, tympanic membrane,  external ear and ear canal normal.  Left Ear: Hearing, tympanic membrane, external ear and ear canal normal.  Nose: Nose normal.  Mouth/Throat: Uvula is midline, oropharynx is clear and moist and mucous membranes are normal. No oropharyngeal exudate, posterior oropharyngeal edema or posterior oropharyngeal erythema.  Eyes: Conjunctivae and EOM are normal. Pupils are equal, round, and reactive to light. No scleral icterus.  Neck: Normal range of motion. Neck supple. Carotid bruit is not present. No thyromegaly present.  Cardiovascular: Normal rate, regular rhythm, normal heart sounds and intact distal pulses.  No murmur heard. Pulses:      Radial pulses are 2+ on the right side, and 2+ on the left side.  Pulmonary/Chest: Effort normal and breath sounds normal. No respiratory distress. He has no wheezes. He has no rales.  Abdominal: Soft. Bowel sounds are normal. He exhibits no distension and no mass. There is no tenderness. There is no rebound and no guarding.  Genitourinary: Rectal exam shows external hemorrhoid (non inflamed). Rectal exam shows no fissure, no mass, no tenderness and anal tone normal. Prostate is not enlarged (20gm) and not tender.  Musculoskeletal: Normal range of motion. He exhibits no edema.  Lymphadenopathy:    He has no cervical adenopathy.  Neurological: He is alert and oriented to person, place, and time.  CN grossly intact, station and gait intact  Skin: Skin is warm and dry. No rash noted.  Psychiatric: He has a normal mood and affect. His behavior is normal. Judgment and thought content normal.  Nursing note and vitals reviewed.  Results for orders placed or performed in visit on 09/07/17  VITAMIN D 25 Hydroxy (Vit-D Deficiency, Fractures)  Result Value Ref Range   VITD 33.83 30.00 - 100.00 ng/mL  CBC with Differential/Platelet  Result Value Ref Range   WBC 10.2 4.0 - 10.5 K/uL   RBC 5.11 4.22 - 5.81 Mil/uL   Hemoglobin 15.9 13.0 - 17.0 g/dL   HCT 46.7 39.0 -  52.0 %   MCV 91.4 78.0 - 100.0 fl   MCHC 34.1 30.0 - 36.0 g/dL   RDW 13.0 11.5 - 15.5 %   Platelets 247.0 150.0 - 400.0 K/uL   Neutrophils Relative % 66.1 43.0 - 77.0 %   Lymphocytes Relative 21.2 12.0 - 46.0 %   Monocytes Relative 11.4 3.0 - 12.0 %   Eosinophils Relative 0.6 0.0 - 5.0 %   Basophils Relative 0.7 0.0 - 3.0 %   Neutro Abs 6.8 1.4 - 7.7 K/uL   Lymphs Abs 2.2 0.7 - 4.0 K/uL   Monocytes Absolute 1.2 (H) 0.1 - 1.0 K/uL   Eosinophils Absolute 0.1 0.0 - 0.7 K/uL   Basophils Absolute 0.1 0.0 - 0.1 K/uL  Basic metabolic panel  Result Value Ref Range   Sodium 135 135 - 145 mEq/L   Potassium 3.8 3.5 - 5.1 mEq/L   Chloride 95 (L) 96 - 112 mEq/L   CO2 31 19 - 32 mEq/L   Glucose, Bld 125 (  H) 70 - 99 mg/dL   BUN 19 6 - 23 mg/dL   Creatinine, Ser 0.90 0.40 - 1.50 mg/dL   Calcium 9.7 8.4 - 10.5 mg/dL   GFR 87.46 >60.00 mL/min  PSA  Result Value Ref Range   PSA 3.73 0.10 - 4.00 ng/mL  Hemoglobin A1c  Result Value Ref Range   Hgb A1c MFr Bld 6.9 (H) 4.6 - 6.5 %  Lipid panel  Result Value Ref Range   Cholesterol 135 0 - 200 mg/dL   Triglycerides 95.0 0.0 - 149.0 mg/dL   HDL 44.60 >39.00 mg/dL   VLDL 19.0 0.0 - 40.0 mg/dL   LDL Cholesterol 71 0 - 99 mg/dL   Total CHOL/HDL Ratio 3    NonHDL 90.33       Assessment & Plan:   Problem List Items Addressed This Visit    Advanced care planning/counseling discussion    Advanced directives: discussed, has packet at home and he will work on this. Doesn't want prolonged life support if terminal condition. Would want wife to be HCPOA.       Controlled type 2 diabetes mellitus without complication, without long-term current use of insulin (HCC)    Reviewed A1c and new development of diabetes over the past year. Recommended diabetic diet as well as weight loss and sooner f/u in 6 months for DM f/u. Pt agrees with plan.       Elevated PSA    This has normalized this year. DRE reassuring.       Health maintenance examination -  Primary    Preventative protocols reviewed and updated unless pt declined. Discussed healthy diet and lifestyle.       HLD (hyperlipidemia)    Chronic, stable. Continue current regimen.       HTN (hypertension)    Chronic, stable. Continue current regimen.       Obesity, Class I, BMI 30-34.9    Discussed healthy diet and lifestyle changes to affect sustainable weight loss.           Follow up plan: Return in about 6 months (around 03/11/2018) for follow up visit.  Ria Bush, MD

## 2017-10-16 ENCOUNTER — Other Ambulatory Visit: Payer: Self-pay | Admitting: Family Medicine

## 2017-10-21 ENCOUNTER — Other Ambulatory Visit: Payer: Self-pay | Admitting: Family Medicine

## 2017-11-27 ENCOUNTER — Encounter: Payer: Self-pay | Admitting: Gastroenterology

## 2017-12-02 ENCOUNTER — Other Ambulatory Visit: Payer: Self-pay | Admitting: Family Medicine

## 2017-12-10 ENCOUNTER — Encounter: Payer: Self-pay | Admitting: Gastroenterology

## 2018-01-19 HISTORY — PX: COLONOSCOPY: SHX174

## 2018-01-26 ENCOUNTER — Other Ambulatory Visit: Payer: Self-pay

## 2018-01-26 ENCOUNTER — Ambulatory Visit (AMBULATORY_SURGERY_CENTER): Payer: Self-pay | Admitting: *Deleted

## 2018-01-26 VITALS — Ht 69.0 in | Wt 218.0 lb

## 2018-01-26 DIAGNOSIS — Z800 Family history of malignant neoplasm of digestive organs: Secondary | ICD-10-CM

## 2018-01-26 DIAGNOSIS — Z86010 Personal history of colonic polyps: Secondary | ICD-10-CM

## 2018-01-26 MED ORDER — PEG 3350-KCL-NA BICARB-NACL 420 G PO SOLR: 4000.0000 mL | Freq: Once | ORAL | 0 refills | Status: AC

## 2018-01-26 NOTE — Progress Notes (Signed)
No egg or soy allergy known to patient  No issues with past sedation with any surgeries  or procedures, no intubation problems  No diet pills per patient No home 02 use per patient  No blood thinners per patient  Pt denies issues with constipation  No A fib or A flutter  EMMI video sent to pt's e mail  

## 2018-01-28 ENCOUNTER — Encounter: Payer: Self-pay | Admitting: Gastroenterology

## 2018-02-02 DIAGNOSIS — H903 Sensorineural hearing loss, bilateral: Secondary | ICD-10-CM | POA: Diagnosis not present

## 2018-02-02 DIAGNOSIS — H6122 Impacted cerumen, left ear: Secondary | ICD-10-CM | POA: Diagnosis not present

## 2018-02-09 ENCOUNTER — Other Ambulatory Visit: Payer: Self-pay

## 2018-02-09 ENCOUNTER — Encounter: Payer: Self-pay | Admitting: Gastroenterology

## 2018-02-09 ENCOUNTER — Ambulatory Visit (AMBULATORY_SURGERY_CENTER): Payer: PPO | Admitting: Gastroenterology

## 2018-02-09 VITALS — BP 98/65 | HR 60 | Temp 95.5°F | Resp 15 | Ht 69.0 in | Wt 218.0 lb

## 2018-02-09 DIAGNOSIS — D125 Benign neoplasm of sigmoid colon: Secondary | ICD-10-CM

## 2018-02-09 DIAGNOSIS — D121 Benign neoplasm of appendix: Secondary | ICD-10-CM | POA: Diagnosis not present

## 2018-02-09 DIAGNOSIS — D120 Benign neoplasm of cecum: Secondary | ICD-10-CM | POA: Diagnosis not present

## 2018-02-09 DIAGNOSIS — K635 Polyp of colon: Secondary | ICD-10-CM

## 2018-02-09 DIAGNOSIS — Z86010 Personal history of colonic polyps: Secondary | ICD-10-CM | POA: Diagnosis not present

## 2018-02-09 DIAGNOSIS — I10 Essential (primary) hypertension: Secondary | ICD-10-CM | POA: Diagnosis not present

## 2018-02-09 MED ORDER — SODIUM CHLORIDE 0.9 % IV SOLN: 500.0000 mL | Freq: Once | INTRAVENOUS | Status: DC

## 2018-02-09 NOTE — Progress Notes (Signed)
Report given to PACU, vss 

## 2018-02-09 NOTE — Op Note (Signed)
[place] Patient Name: Blake Peterson Procedure Date: 02/09/2018 9:34 AM MRN: [id number] Endoscopist: Ladene Artist , MD Age: 77 Referring MD:  Date of Birth: [birth year] Gender: Male Account #: [id number] Procedure:                Colonoscopy Indications:              Surveillance: Personal history of adenomatous                            polyps on last colonoscopy 5 years ago Medicines:                Monitored Anesthesia Care Procedure:                Pre-Anesthesia Assessment:                           - Prior to the procedure, a History and Physical                            was performed, and patient medications and                            allergies were reviewed. The patient's tolerance of                            previous anesthesia was also reviewed. The risks                            and benefits of the procedure and the sedation                            options and risks were discussed with the patient.                            All questions were answered, and informed consent                            was obtained. Prior Anticoagulants: The patient has                            taken no previous anticoagulant or antiplatelet                            agents. ASA Grade Assessment: II - A patient with                            mild systemic disease. After reviewing the risks                            and benefits, the patient was deemed in                            satisfactory condition to undergo the procedure.  After obtaining informed consent, the colonoscope                            was passed under direct vision. Throughout the                            procedure, the patient's blood pressure, pulse, and                            oxygen saturations were monitored continuously. The                            Colonoscope was introduced through the anus and                            advanced to the the cecum,  identified by                            appendiceal orifice and ileocecal valve. The                            ileocecal valve, appendiceal orifice, and rectum                            were photographed. The quality of the bowel                            preparation was good. The colonoscopy was performed                            without difficulty. The patient tolerated the                            procedure well. Scope In: 9:39:38 AM Scope Out: 9:52:37 AM Scope Withdrawal Time: 0 hours 10 minutes 59 seconds  Total Procedure Duration: 0 hours 12 minutes 59 seconds  Findings:                 The perianal and digital rectal examinations were                            normal.                           Two sessile polyps were found in the sigmoid colon                            and appendiceal orifice. The polyps were 5 to 7 mm                            in size. These polyps were removed with a cold                            snare. Resection and retrieval were complete.  Multiple medium-mouthed diverticula were found in                            the left colon. There was no evidence of                            diverticular bleeding.                           Internal hemorrhoids were found during                            retroflexion. The hemorrhoids were small and Grade                            I (internal hemorrhoids that do not prolapse).                           The exam was otherwise without abnormality on                            direct and retroflexion views. Complications:            No immediate complications. Estimated blood loss:                            None. Estimated Blood Loss:     Estimated blood loss: none. Impression:               - Two 5 to 7 mm polyps in the sigmoid colon and at                            the appendiceal orifice, removed with a cold snare.                            Resected and retrieved.                            - Moderate diverticulosis in the left colon. There                            was no evidence of diverticular bleeding.                           - Internal hemorrhoids.                           - The examination was otherwise normal on direct                            and retroflexion views. Recommendation:           - Repeat colonoscopy in 5 years for surveillance.                           - Patient has a contact number available for  emergencies. The signs and symptoms of potential                            delayed complications were discussed with the                            patient. Return to normal activities tomorrow.                            Written discharge instructions were provided to the                            patient.                           - High fiber diet.                           - Continue present medications.                           - Await pathology results. Ladene Artist, MD 02/09/2018 9:59:27 AM This report has been signed electronically.

## 2018-02-09 NOTE — Progress Notes (Signed)
Called to room to assist during endoscopic procedure.  Patient ID and intended procedure confirmed with present staff. Received instructions for my participation in the procedure from the performing physician.  

## 2018-02-09 NOTE — Patient Instructions (Signed)
YOU HAD AN ENDOSCOPIC PROCEDURE TODAY AT [facility]:   Refer to the procedure report that was given to you for any specific questions about what was found during the examination.  If the procedure report does not answer your questions, please call your gastroenterologist to clarify.  If you requested that your care partner not be given the details of your procedure findings, then the procedure report has been included in a sealed envelope for you to review at your convenience later.  YOU SHOULD EXPECT: Some feelings of bloating in the abdomen. Passage of more gas than usual.  Walking can help get rid of the air that was put into your GI tract during the procedure and reduce the bloating. If you had a lower endoscopy (such as a colonoscopy or flexible sigmoidoscopy) you may notice spotting of blood in your stool or on the toilet paper. If you underwent a bowel prep for your procedure, you may not have a normal bowel movement for a few days.  Please Note:  You might notice some irritation and congestion in your nose or some drainage.  This is from the oxygen used during your procedure.  There is no need for concern and it should clear up in a day or so.  SYMPTOMS TO REPORT IMMEDIATELY:   Following lower endoscopy (colonoscopy or flexible sigmoidoscopy):  Excessive amounts of blood in the stool  Significant tenderness or worsening of abdominal pains  Swelling of the abdomen that is new, acute  Fever of 100F or higher  For urgent or emergent issues, a gastroenterologist can be reached at any hour by calling [phone].  Please see handout on polyps.  DIET:  We do recommend a small meal at first, but then you may proceed to your regular diet.  Drink plenty of fluids but you should avoid alcoholic beverages for 24 hours.  ACTIVITY:  You should plan to take it easy for the rest of today and you should NOT DRIVE or use heavy machinery until tomorrow (because of the sedation  medicines used during the test).    FOLLOW UP: Our staff will call the number listed on your records the next business day following your procedure to check on you and address any questions or concerns that you may have regarding the information given to you following your procedure. If we do not reach you, we will leave a message.  However, if you are feeling well and you are not experiencing any problems, there is no need to return our call.  We will assume that you have returned to your regular daily activities without incident.  If any biopsies were taken you will be contacted by phone or by letter within the next 1-3 weeks.  Please call us at [phone] if you have not heard about the biopsies in 3 weeks.    SIGNATURES/CONFIDENTIALITY: You and/or your care partner have signed paperwork which will be entered into your electronic medical record.  These signatures attest to the fact that that the information above on your After Visit Summary has been reviewed and is understood.  Full responsibility of the confidentiality of this discharge information lies with you and/or your care-partner.   Thank you for allowing [place] to provide your healthcare today.

## 2018-02-10 ENCOUNTER — Telehealth: Payer: Self-pay | Admitting: *Deleted

## 2018-02-10 NOTE — Telephone Encounter (Signed)
  Follow up Call-  Call back number 02/09/2018  Post procedure Call Back phone  # [phone]  Permission to leave phone message Yes  Some recent data might be hidden     Patient questions:  Do you have a fever, pain , or abdominal swelling? No. Pain Score  0 *  Have you tolerated food without any problems? Yes.    Have you been able to return to your normal activities? Yes.    Do you have any questions about your discharge instructions: Diet   No. Medications  No. Follow up visit  No.  Do you have questions or concerns about your Care? No.  Actions: * If pain score is 4 or above: No action needed, pain <4.

## 2018-02-14 ENCOUNTER — Encounter: Payer: Self-pay | Admitting: Family Medicine

## 2018-02-23 ENCOUNTER — Encounter: Payer: Self-pay | Admitting: Gastroenterology

## 2018-02-24 ENCOUNTER — Encounter: Payer: Self-pay | Admitting: Family Medicine

## 2018-03-04 ENCOUNTER — Other Ambulatory Visit: Payer: Self-pay | Admitting: Family Medicine

## 2018-03-04 ENCOUNTER — Other Ambulatory Visit (INDEPENDENT_AMBULATORY_CARE_PROVIDER_SITE_OTHER): Payer: PPO

## 2018-03-04 DIAGNOSIS — E785 Hyperlipidemia, unspecified: Secondary | ICD-10-CM | POA: Diagnosis not present

## 2018-03-04 DIAGNOSIS — E119 Type 2 diabetes mellitus without complications: Secondary | ICD-10-CM | POA: Diagnosis not present

## 2018-03-04 DIAGNOSIS — R972 Elevated prostate specific antigen [PSA]: Secondary | ICD-10-CM

## 2018-03-04 DIAGNOSIS — E559 Vitamin D deficiency, unspecified: Secondary | ICD-10-CM

## 2018-03-04 LAB — COMPREHENSIVE METABOLIC PANEL
ALT: 29 U/L (ref 0–53)
AST: 22 U/L (ref 0–37)
Albumin: 4.2 g/dL (ref 3.5–5.2)
Alkaline Phosphatase: 70 U/L (ref 39–117)
BILIRUBIN TOTAL: 0.7 mg/dL (ref 0.2–1.2)
BUN: 21 mg/dL (ref 6–23)
CO2: 30 meq/L (ref 19–32)
Calcium: 10 mg/dL (ref 8.4–10.5)
Chloride: 97 mEq/L (ref 96–112)
Creatinine, Ser: 0.87 mg/dL (ref 0.40–1.50)
GFR: 90.83 mL/min (ref >60.00)
Glucose, Bld: 128 mg/dL — ABNORMAL HIGH (ref 70–99)
POTASSIUM: 3.9 meq/L (ref 3.5–5.1)
SODIUM: 136 meq/L (ref 135–145)
Total Protein: 7.4 g/dL (ref 6.0–8.3)

## 2018-03-04 LAB — LIPID PANEL
CHOL/HDL RATIO: 3
Cholesterol: 132 mg/dL (ref 0–200)
HDL: 42.7 mg/dL (ref >39.00)
LDL Cholesterol: 70 mg/dL (ref 0–99)
NonHDL: 89.24
Triglycerides: 97 mg/dL (ref 0.0–149.0)
VLDL: 19.4 mg/dL (ref 0.0–40.0)

## 2018-03-04 LAB — HEMOGLOBIN A1C: HEMOGLOBIN A1C: 7 % — AB (ref 4.6–6.5)

## 2018-03-11 ENCOUNTER — Ambulatory Visit (INDEPENDENT_AMBULATORY_CARE_PROVIDER_SITE_OTHER): Payer: PPO | Admitting: Family Medicine

## 2018-03-11 ENCOUNTER — Encounter: Payer: Self-pay | Admitting: Family Medicine

## 2018-03-11 VITALS — BP 130/66 | HR 60 | Temp 97.9°F | Ht 69.0 in | Wt 213.5 lb

## 2018-03-11 DIAGNOSIS — E785 Hyperlipidemia, unspecified: Secondary | ICD-10-CM | POA: Diagnosis not present

## 2018-03-11 DIAGNOSIS — E669 Obesity, unspecified: Secondary | ICD-10-CM | POA: Diagnosis not present

## 2018-03-11 DIAGNOSIS — E119 Type 2 diabetes mellitus without complications: Secondary | ICD-10-CM

## 2018-03-11 NOTE — Assessment & Plan Note (Signed)
Levels at goal on statin.

## 2018-03-11 NOTE — Progress Notes (Signed)
BP 130/66 (BP Location: Left Arm, Patient Position: Sitting, Cuff Size: Large)   Pulse 60   Temp 97.9 F (36.6 C) (Oral)   Ht 5\' 9"  (1.753 m)   Wt 213 lb 8 oz (96.8 kg)   SpO2 100%   BMI 31.53 kg/m    CC: 6 mo f/u visit Subjective:    Patient ID: Blake Peterson, male    DOB: [birth year], 77 y.o.   MRN: [id number]  HPI: Blake Peterson is a 77 y.o. male presenting on 03/11/2018 for 6 mo follow up   DM - does not regularly check sugars. Compliant with antihyperglycemic regimen which includes: diabetic diet - has worked on this, decreasing sugar levels, has increased bean intake. Denies low sugars or hypoglycemic symptoms. Denies paresthesias. Last diabetic eye exam 07/2017. Pneumovax: 2010. Prevnar: 2015. Glucometer brand: unsure. DSME: interested. Lab Results  Component Value Date   HGBA1C 7.0 (H) 03/04/2018   Diabetic Foot Exam - Simple   Simple Foot Form Diabetic Foot exam was performed with the following findings:  Yes 03/11/2018  8:55 AM  Visual Inspection No deformities, no ulcerations, no other skin breakdown bilaterally:  Yes Sensation Testing Intact to touch and monofilament testing bilaterally:  Yes Pulse Check Posterior Tibialis and Dorsalis pulse intact bilaterally:  Yes Comments    Lab Results  Component Value Date   MICROALBUR 1.1 07/27/2008     Relevant past medical, surgical, family and social history reviewed and updated as indicated. Interim medical history since our last visit reviewed. Allergies and medications reviewed and updated. Outpatient Medications Prior to Visit  Medication Sig Dispense Refill  . aspirin 81 MG tablet Take 81 mg by mouth daily.      Marland Kitchen atenolol (TENORMIN) 50 MG tablet TAKE 1 TABLET (50 MG TOTAL) BY MOUTH DAILY. 90 tablet 3  . atorvastatin (LIPITOR) 80 MG tablet TAKE 1 TABLET (80 MG TOTAL) BY MOUTH AT BEDTIME. 90 tablet 1  . chlorthalidone (HYGROTON) 25 MG tablet TAKE 1 TABLET (25 MG TOTAL) BY MOUTH DAILY. 90 tablet 2  .  cholecalciferol (VITAMIN D) 1000 UNITS tablet Take 1,000 Units by mouth daily.      . enalapril (VASOTEC) 10 MG tablet TAKE 1 TABLET (10 MG TOTAL) BY MOUTH 2 (TWO) TIMES DAILY. 180 tablet 1  . sildenafil (VIAGRA) 100 MG tablet Take 100 mg by mouth daily as needed.       Facility-Administered Medications Prior to Visit  Medication Dose Route Frequency Provider Last Rate Last Dose  . 0.9 %  sodium chloride infusion  500 mL Intravenous Once Ladene Artist, MD         Per HPI unless specifically indicated in ROS section below Review of Systems     Objective:    BP 130/66 (BP Location: Left Arm, Patient Position: Sitting, Cuff Size: Large)   Pulse 60   Temp 97.9 F (36.6 C) (Oral)   Ht 5\' 9"  (1.753 m)   Wt 213 lb 8 oz (96.8 kg)   SpO2 100%   BMI 31.53 kg/m   Wt Readings from Last 3 Encounters:  03/11/18 213 lb 8 oz (96.8 kg)  02/09/18 218 lb (98.9 kg)  01/26/18 218 lb (98.9 kg)    Physical Exam  Constitutional: He appears well-developed and well-nourished. No distress.  HENT:  Head: Normocephalic and atraumatic.  Right Ear: External ear normal.  Left Ear: External ear normal.  Nose: Nose normal.  Mouth/Throat: Oropharynx is clear and moist. No oropharyngeal exudate.  Eyes: Pupils are equal, round, and reactive to light. Conjunctivae and EOM are normal. No scleral icterus.  Neck: Normal range of motion. Neck supple.  Cardiovascular: Normal rate, regular rhythm, normal heart sounds and intact distal pulses.  No murmur heard. Pulmonary/Chest: Effort normal and breath sounds normal. No respiratory distress. He has no wheezes. He has no rales.  Musculoskeletal: He exhibits no edema.  See HPI for foot exam if done  Lymphadenopathy:    He has no cervical adenopathy.  Skin: Skin is warm and dry. No rash noted.  Psychiatric: He has a normal mood and affect.  Nursing note and vitals reviewed.  Results for orders placed or performed in visit on 03/11/18  [facility] DIABETES EYE EXAM    Result Value Ref Range   HM Diabetic Eye Exam No Retinopathy No Retinopathy      Assessment & Plan:   Problem List Items Addressed This Visit    Obesity, Class I, BMI 30-34.9    Congratulated on weight loss noted. Continue healthy diet changes. Will refer to diabetes education.       HLD (hyperlipidemia)    Levels at goal on statin.       Controlled type 2 diabetes mellitus without complication, without long-term current use of insulin (HCC) - Primary    A1c remains in controlled diabetes range. I have given him glucose log to monitor sugars daily, reviewed fasting or post prandial goal range. Check more regularly 2 wks prior to next visit and bring log to CPE. Reviewed diabetic diet. Recommended sandwich thins in place of regular bread.  Will refer to diabetes education.       Relevant Orders   Ambulatory referral to diabetic education       No orders of the defined types were placed in this encounter.  Orders Placed This Encounter  Procedures  . Ambulatory referral to diabetic education    Referral Priority:   Routine    Referral Type:   Consultation    Referral Reason:   Specialty Services Required    Number of Visits Requested:   1  . [facility] DIABETES EYE EXAM    This external order was created through the Results Console.    Follow up plan: No follow-ups on file.  Ria Bush, MD

## 2018-03-11 NOTE — Assessment & Plan Note (Addendum)
Congratulated on weight loss noted. Continue healthy diet changes. Will refer to diabetes education.

## 2018-03-11 NOTE — Patient Instructions (Addendum)
Check with pharmacy about insurance preferred glucose meter brand and let me know.  Check sugars once daily - either fasting before a meal (goal 80-120) or 2 hours after a meal (goal <180).  Sugar log provided today.  We will need yearly eye exam for diabetes.  We will refer you to diabetes classes.  Return in 6 months for physical  Diabetes Mellitus and Nutrition When you have diabetes (diabetes mellitus), it is very important to have healthy eating habits because your blood sugar (glucose) levels are greatly affected by what you eat and drink. Eating healthy foods in the appropriate amounts, at about the same times every day, can help you:  Control your blood glucose.  Lower your risk of heart disease.  Improve your blood pressure.  Reach or maintain a healthy weight.  Every person with diabetes is different, and each person has different needs for a meal plan. Your health care provider may recommend that you work with a diet and nutrition specialist (dietitian) to make a meal plan that is best for you. Your meal plan may vary depending on factors such as:  The calories you need.  The medicines you take.  Your weight.  Your blood glucose, blood pressure, and cholesterol levels.  Your activity level.  Other health conditions you have, such as heart or kidney disease.  How do carbohydrates affect me? Carbohydrates affect your blood glucose level more than any other type of food. Eating carbohydrates naturally increases the amount of glucose in your blood. Carbohydrate counting is a method for keeping track of how many carbohydrates you eat. Counting carbohydrates is important to keep your blood glucose at a healthy level, especially if you use insulin or take certain oral diabetes medicines. It is important to know how many carbohydrates you can safely have in each meal. This is different for every person. Your dietitian can help you calculate how many carbohydrates you should have  at each meal and for snack. Foods that contain carbohydrates include:  Bread, cereal, rice, pasta, and crackers.  Potatoes and corn.  Peas, beans, and lentils.  Milk and yogurt.  Fruit and juice.  Desserts, such as cakes, cookies, ice cream, and candy.  How does alcohol affect me? Alcohol can cause a sudden decrease in blood glucose (hypoglycemia), especially if you use insulin or take certain oral diabetes medicines. Hypoglycemia can be a life-threatening condition. Symptoms of hypoglycemia (sleepiness, dizziness, and confusion) are similar to symptoms of having too much alcohol. If your health care provider says that alcohol is safe for you, follow these guidelines:  Limit alcohol intake to no more than 1 drink per day for nonpregnant women and 2 drinks per day for men. One drink equals 12 oz of beer, 5 oz of wine, or 1 oz of hard liquor.  Do not drink on an empty stomach.  Keep yourself hydrated with water, diet soda, or unsweetened iced tea.  Keep in mind that regular soda, juice, and other mixers may contain a lot of sugar and must be counted as carbohydrates.  What are tips for following this plan? Reading food labels  Start by checking the serving size on the label. The amount of calories, carbohydrates, fats, and other nutrients listed on the label are based on one serving of the food. Many foods contain more than one serving per package.  Check the total grams (g) of carbohydrates in one serving. You can calculate the number of servings of carbohydrates in one serving by dividing the  total carbohydrates by 15. For example, if a food has 30 g of total carbohydrates, it would be equal to 2 servings of carbohydrates.  Check the number of grams (g) of saturated and trans fats in one serving. Choose foods that have low or no amount of these fats.  Check the number of milligrams (mg) of sodium in one serving. Most people should limit total sodium intake to less than 2,300 mg  per day.  Always check the nutrition information of foods labeled as "low-fat" or "nonfat". These foods may be higher in added sugar or refined carbohydrates and should be avoided.  Talk to your dietitian to identify your daily goals for nutrients listed on the label. Shopping  Avoid buying canned, premade, or processed foods. These foods tend to be high in fat, sodium, and added sugar.  Shop around the outside edge of the grocery store. This includes fresh fruits and vegetables, bulk grains, fresh meats, and fresh dairy. Cooking  Use low-heat cooking methods, such as baking, instead of high-heat cooking methods like deep frying.  Cook using healthy oils, such as olive, canola, or sunflower oil.  Avoid cooking with butter, cream, or high-fat meats. Meal planning  Eat meals and snacks regularly, preferably at the same times every day. Avoid going long periods of time without eating.  Eat foods high in fiber, such as fresh fruits, vegetables, beans, and whole grains. Talk to your dietitian about how many servings of carbohydrates you can eat at each meal.  Eat 4-6 ounces of lean protein each day, such as lean meat, chicken, fish, eggs, or tofu. 1 ounce is equal to 1 ounce of meat, chicken, or fish, 1 egg, or 1/4 cup of tofu.  Eat some foods each day that contain healthy fats, such as avocado, nuts, seeds, and fish. Lifestyle   Check your blood glucose regularly.  Exercise at least 30 minutes 5 or more days each week, or as told by your health care provider.  Take medicines as told by your health care provider.  Do not use any products that contain nicotine or tobacco, such as cigarettes and e-cigarettes. If you need help quitting, ask your health care provider.  Work with a Social worker or diabetes educator to identify strategies to manage stress and any emotional and social challenges. What are some questions to ask my health care provider?  Do I need to meet with a diabetes  educator?  Do I need to meet with a dietitian?  What number can I call if I have questions?  When are the best times to check my blood glucose? Where to find more information:  American Diabetes Association: diabetes.org/food-and-fitness/food  Academy of Nutrition and Dietetics: PokerClues.dk  [employer] of Diabetes and Digestive and Kidney Diseases (NIH): ContactWire.be Summary  A healthy meal plan will help you control your blood glucose and maintain a healthy lifestyle.  Working with a diet and nutrition specialist (dietitian) can help you make a meal plan that is best for you.  Keep in mind that carbohydrates and alcohol have immediate effects on your blood glucose levels. It is important to count carbohydrates and to use alcohol carefully. This information is not intended to replace advice given to you by your health care provider. Make sure you discuss any questions you have with your health care provider. Document Released: 06/04/2005 Document Revised: 10/12/2016 Document Reviewed: 10/12/2016 Elsevier Interactive Patient Education  [employer].

## 2018-03-11 NOTE — Assessment & Plan Note (Signed)
A1c remains in controlled diabetes range. I have given him glucose log to monitor sugars daily, reviewed fasting or post prandial goal range. Check more regularly 2 wks prior to next visit and bring log to CPE. Reviewed diabetic diet. Recommended sandwich thins in place of regular bread.  Will refer to diabetes education.

## 2018-04-18 ENCOUNTER — Other Ambulatory Visit: Payer: Self-pay | Admitting: Family Medicine

## 2018-04-24 ENCOUNTER — Other Ambulatory Visit: Payer: Self-pay | Admitting: Family Medicine

## 2018-05-02 ENCOUNTER — Encounter: Payer: Self-pay | Admitting: Family Medicine

## 2018-05-02 ENCOUNTER — Ambulatory Visit (INDEPENDENT_AMBULATORY_CARE_PROVIDER_SITE_OTHER): Payer: PPO | Admitting: Family Medicine

## 2018-05-02 VITALS — BP 126/78 | HR 65 | Temp 97.8°F | Ht 69.0 in | Wt 211.0 lb

## 2018-05-02 DIAGNOSIS — M25512 Pain in left shoulder: Secondary | ICD-10-CM | POA: Diagnosis not present

## 2018-05-02 DIAGNOSIS — M353 Polymyalgia rheumatica: Secondary | ICD-10-CM | POA: Insufficient documentation

## 2018-05-02 MED ORDER — NAPROXEN 500 MG PO TABS: ORAL_TABLET | ORAL | 0 refills | Status: DC

## 2018-05-02 NOTE — Patient Instructions (Signed)
You have rotator cuff tendoditis and possible biceps tendonitis of left shoulder. Treat with naprosyn 500mg  twice daily for 1 week, take with food.  Use ice or heating pad (not directly on skin) Do exercises provided today - using pain as your guide. Let us know if not improving with treatment, or if you'd like stronger pain medicine.

## 2018-05-02 NOTE — Progress Notes (Signed)
BP 126/78 (BP Location: Left Arm, Patient Position: Sitting, Cuff Size: Normal)   Pulse 65   Temp 97.8 F (36.6 C) (Oral)   Ht 5\' 9"  (1.753 m)   Wt 211 lb (95.7 kg)   SpO2 98%   BMI 31.16 kg/m    CC: bilat shoulder pain Subjective:    Patient ID: Blake Peterson, male    DOB: March 20, 1941, 76 y.o.   MRN: [id number]  HPI: Blake Peterson is a 77 y.o. male presenting on 05/02/2018 for Shoulder Pain (C/o bilateral shoulder pain. Started 04/22/18. Pain is intermittent and occurs with certain movements and when trying to sleep at night. Tried ibuprofien. )   10d h/o bilateral shoulder pain that started R side, radiation to L shoulder down L arm. He also had some bilateral hip pain L>R, also has had some heel discomfort. Predominantly L shoulder pain worse at night time - wakes him up. Has tried ibuprofen 400mg  several times a day. Hot shower also helps. Some swelling at L shoulder.   No fever, redness/significant swelling or warmth of joints. No lower back pain. Denies rash, abd pain, headache, nausea.  He did have a bug bit L lateral leg - 6 weeks ago.  Denies inciting trauma/injury or falls.  He did mow 2 yards prior to pain starting.  No known h/o gout.   Relevant past medical, surgical, family and social history reviewed and updated as indicated. Interim medical history since our last visit reviewed. Allergies and medications reviewed and updated. Outpatient Medications Prior to Visit  Medication Sig Dispense Refill  . aspirin 81 MG tablet Take 81 mg by mouth daily.      Marland Kitchen atenolol (TENORMIN) 50 MG tablet TAKE 1 TABLET (50 MG TOTAL) BY MOUTH DAILY. 90 tablet 3  . atorvastatin (LIPITOR) 80 MG tablet TAKE 1 TABLET (80 MG TOTAL) BY MOUTH AT BEDTIME. 90 tablet 1  . chlorthalidone (HYGROTON) 25 MG tablet TAKE 1 TABLET (25 MG TOTAL) BY MOUTH DAILY. 90 tablet 2  . cholecalciferol (VITAMIN D) 1000 UNITS tablet Take 1,000 Units by mouth daily.      . enalapril (VASOTEC) 10 MG tablet TAKE 1  TABLET (10 MG TOTAL) BY MOUTH 2 (TWO) TIMES DAILY. 180 tablet 1  . sildenafil (VIAGRA) 100 MG tablet Take 100 mg by mouth daily as needed.       Facility-Administered Medications Prior to Visit  Medication Dose Route Frequency Provider Last Rate Last Dose  . 0.9 %  sodium chloride infusion  500 mL Intravenous Once Ladene Artist, MD         Per HPI unless specifically indicated in ROS section below Review of Systems     Objective:    BP 126/78 (BP Location: Left Arm, Patient Position: Sitting, Cuff Size: Normal)   Pulse 65   Temp 97.8 F (36.6 C) (Oral)   Ht 5\' 9"  (1.753 m)   Wt 211 lb (95.7 kg)   SpO2 98%   BMI 31.16 kg/m   Wt Readings from Last 3 Encounters:  05/02/18 211 lb (95.7 kg)  03/11/18 213 lb 8 oz (96.8 kg)  02/09/18 218 lb (98.9 kg)    Physical Exam  Constitutional: He appears well-developed and well-nourished. No distress.  Musculoskeletal: Normal range of motion. He exhibits no edema.  R shoulder WNL L Shoulder exam: No deformity of shoulders on inspection. Discomfort with palpation of anterior shoulder  FROM in abduction and forward flexion, with discomfort. No pain or weakness with testing  SITS in ext/int rotation. ++ pain with empty can sign. ++ Speed test. Tender with impingement. No significant pain with rotation of humeral head in [place] joint.   Skin: Skin is warm and dry. No rash noted.  Psychiatric: He has a normal mood and affect.  Nursing note and vitals reviewed.  Results for orders placed or performed in visit on 03/11/18  HM DIABETES EYE EXAM  Result Value Ref Range   HM Diabetic Eye Exam No Retinopathy No Retinopathy   Lab Results  Component Value Date   CREATININE 0.87 03/04/2018       Assessment & Plan:   Problem List Items Addressed This Visit    Left shoulder pain - Primary    Story/exam consistent with L >R shoulder RTC tendonitis and to a lesser degree L biceps tendonitis - likely exacerbated after recent mowing 2 lawns.  Supportive care reviewed including NSAID course (naprosyn 500mg  bid x 1 wk with food), ice/heat, and provided with exercises from SM pt advisor on RTC injury. Update if not improving with treatment. Pt agrees with plan.           Meds ordered this encounter  Medications  . naproxen (NAPROSYN) 500 MG tablet    Sig: Take one po bid x 1 week then prn pain, take with food    Dispense:  30 tablet    Refill:  0   No orders of the defined types were placed in this encounter.   Follow up plan: Return if symptoms worsen or fail to improve.  Ria Bush, MD

## 2018-05-02 NOTE — Assessment & Plan Note (Signed)
Story/exam consistent with L >R shoulder RTC tendonitis and to a lesser degree L biceps tendonitis - likely exacerbated after recent mowing 2 lawns. Supportive care reviewed including NSAID course (naprosyn 500mg  bid x 1 wk with food), ice/heat, and provided with exercises from SM pt advisor on RTC injury. Update if not improving with treatment. Pt agrees with plan.

## 2018-05-06 ENCOUNTER — Ambulatory Visit: Payer: Self-pay

## 2018-05-06 NOTE — Telephone Encounter (Signed)
Patient called in and says "I started on Naproxen and since I've been taking it, my fingers have swollen. I had to take my rings off my fingers and I've noticed my ankles are getting tight feeling." I asked about other symptoms, difficulty breathing, chest pain, he says "no." I advised I will send this to Dr. Danise Mina and someone will call back with his recommendation, patient verbalized understanding.   Reason for Disposition . Caller has NON-URGENT medication question about med that PCP prescribed and triager unable to answer question  Answer Assessment - Initial Assessment Questions 1. SYMPTOMS: "Do you have any symptoms?"     Yes, fingers swelling 2. SEVERITY: If symptoms are present, ask "Are they mild, moderate or severe?"     Moderate  Protocols used: MEDICATION QUESTION CALL-A-AH

## 2018-05-06 NOTE — Telephone Encounter (Signed)
Spoke with pt relaying instructions per Dr. Darnell Level.  Pt verbalizes understanding.  Says his shoulders hurt depending on which way he moves and when laying on either side.

## 2018-05-06 NOTE — Telephone Encounter (Signed)
Stop naprosyn. May return to ibuprofen but take 600mg  with breakfast and dinner. Do this over weekend.  How is shoulder feeling?

## 2018-05-09 ENCOUNTER — Encounter: Payer: Self-pay | Admitting: Family Medicine

## 2018-05-09 ENCOUNTER — Ambulatory Visit (INDEPENDENT_AMBULATORY_CARE_PROVIDER_SITE_OTHER): Payer: PPO | Admitting: Family Medicine

## 2018-05-09 ENCOUNTER — Other Ambulatory Visit: Payer: Self-pay | Admitting: Family Medicine

## 2018-05-09 VITALS — BP 120/60 | HR 68 | Temp 97.8°F | Ht 69.0 in | Wt 210.2 lb

## 2018-05-09 DIAGNOSIS — R600 Localized edema: Secondary | ICD-10-CM

## 2018-05-09 DIAGNOSIS — M25512 Pain in left shoulder: Secondary | ICD-10-CM | POA: Diagnosis not present

## 2018-05-09 DIAGNOSIS — M25511 Pain in right shoulder: Secondary | ICD-10-CM | POA: Diagnosis not present

## 2018-05-09 MED ORDER — PREDNISONE 20 MG PO TABS: ORAL_TABLET | ORAL | 0 refills | Status: DC

## 2018-05-09 MED ORDER — TRAMADOL HCL 50 MG PO TABS: 25.0000 mg | ORAL_TABLET | Freq: Three times a day (TID) | ORAL | 0 refills | Status: DC | PRN

## 2018-05-09 NOTE — Progress Notes (Signed)
BP 120/60 (BP Location: Left Arm, Patient Position: Sitting, Cuff Size: Normal)   Pulse 68   Temp 97.8 F (36.6 C) (Oral)   Ht 5\' 9"  (1.753 m)   Wt 210 lb 4 oz (95.4 kg)   SpO2 97%   BMI 31.05 kg/m    CC: L shoulder pain Subjective:    Patient ID: Blake Peterson, male    DOB: 1941/09/08, 77 y.o.   MRN: [id number]  HPI: Blake Peterson is a 78 y.o. male presenting on 05/09/2018 for Shoulder Pain (C/o sharp intermittent left shoulder pain and swelling for about 3.5 wks. Shoulder feels sore at times, then stiff. Has difficulty gripping in bilateral hands, left is worse. Currently treating with ibuprofen daily. )   See prior note for details. Seen 1 wk ago with dx RTC tendonitis with L biceps tendonitis exacerbated after mowing 2 lawns. Treated with naprosyn course - this led to finger and ankle swelling so it was stopped. He has been taking ibuprofen 600mg  BID AC instead.   Acute worsening of L shoulder pain at night, also with ongoing R shoulder pain, both limiting ROM. Decreased grip strength on left.  Some base of neck pain on left.  Some numbness of left tips of digits.   Persistent pedal edema.   Relevant past medical, surgical, family and social history reviewed and updated as indicated. Interim medical history since our last visit reviewed. Allergies and medications reviewed and updated. Outpatient Medications Prior to Visit  Medication Sig Dispense Refill  . aspirin 81 MG tablet Take 81 mg by mouth daily.      Marland Kitchen atenolol (TENORMIN) 50 MG tablet TAKE 1 TABLET (50 MG TOTAL) BY MOUTH DAILY. 90 tablet 3  . atorvastatin (LIPITOR) 80 MG tablet TAKE 1 TABLET (80 MG TOTAL) BY MOUTH AT BEDTIME. 90 tablet 1  . chlorthalidone (HYGROTON) 25 MG tablet TAKE 1 TABLET (25 MG TOTAL) BY MOUTH DAILY. 90 tablet 2  . cholecalciferol (VITAMIN D) 1000 UNITS tablet Take 1,000 Units by mouth daily.      . enalapril (VASOTEC) 10 MG tablet TAKE 1 TABLET (10 MG TOTAL) BY MOUTH 2 (TWO) TIMES DAILY.  180 tablet 1  . sildenafil (VIAGRA) 100 MG tablet Take 100 mg by mouth daily as needed.       Facility-Administered Medications Prior to Visit  Medication Dose Route Frequency Provider Last Rate Last Dose  . 0.9 %  sodium chloride infusion  500 mL Intravenous Once Ladene Artist, MD         Per HPI unless specifically indicated in ROS section below Review of Systems     Objective:    BP 120/60 (BP Location: Left Arm, Patient Position: Sitting, Cuff Size: Normal)   Pulse 68   Temp 97.8 F (36.6 C) (Oral)   Ht 5\' 9"  (1.753 m)   Wt 210 lb 4 oz (95.4 kg)   SpO2 97%   BMI 31.05 kg/m   Wt Readings from Last 3 Encounters:  05/09/18 210 lb 4 oz (95.4 kg)  05/02/18 211 lb (95.7 kg)  03/11/18 213 lb 8 oz (96.8 kg)    Physical Exam  Constitutional: He appears well-developed and well-nourished. No distress.  Musculoskeletal: He exhibits edema (1+ pedal edema bilateral lower legs to dorsal feet).  No midline cervical spine tenderness or significant trap tenderness Tender to palpation along biceps tendon and along lateral upper arm No significant bursal pain today Tender with ROM at shoulder predominantly from 45 to 90  degrees testing bilaterally but neg drop arm test ++ speed and empty can test  Neurological:  grip strength intact  Skin: No rash noted.  Nursing note and vitals reviewed.  Results for orders placed or performed in visit on 03/11/18  HM DIABETES EYE EXAM  Result Value Ref Range   HM Diabetic Eye Exam No Retinopathy No Retinopathy   Lab Results  Component Value Date   CREATININE 0.87 03/04/2018   BUN 21 03/04/2018   NA 136 03/04/2018   K 3.9 03/04/2018   CL 97 03/04/2018   CO2 30 03/04/2018      Assessment & Plan:   Problem List Items Addressed This Visit    Pedal edema    Attributed to NSAID. Now off naprosyn, has been taking ibuprofen but with persistent pedal edema. Will stop ibuprofen for now and monitor.       Bilateral shoulder pain - Primary     Anticipate worsening biceps tendonitis R arm and bilateral RTC tendonitis. He has not tolerated naprosyn, ibuprofen not achieving optimal effect. Will Rx prednisone taper in place of NSAID, add tramadol for breakthrough pain at night - discussed sedation precautions with this narcotic.  I did recommend SM referral to discuss further treatment, consider biceps tendon injection. Pt agrees with plan.           Meds ordered this encounter  Medications  . predniSONE (DELTASONE) 20 MG tablet    Sig: Take three tablets daily for 2 days followed by two tablet daily for 2 days followed by one tablet for 4 days    Dispense:  14 tablet    Refill:  0  . traMADol (ULTRAM) 50 MG tablet    Sig: Take 0.5-1 tablets (25-50 mg total) by mouth every 8 (eight) hours as needed.    Dispense:  20 tablet    Refill:  0   No orders of the defined types were placed in this encounter.   Follow up plan: No follow-ups on file.  Ria Bush, MD

## 2018-05-09 NOTE — Patient Instructions (Addendum)
I think you have worsening biceps tendonitis - stop ibuprofen, treat with steroid course sent to pharmacy. Return as soon as able for appt with Dr Lorelei Pont.

## 2018-05-10 DIAGNOSIS — R600 Localized edema: Secondary | ICD-10-CM | POA: Insufficient documentation

## 2018-05-10 NOTE — Assessment & Plan Note (Signed)
Attributed to NSAID. Now off naprosyn, has been taking ibuprofen but with persistent pedal edema. Will stop ibuprofen for now and monitor.

## 2018-05-10 NOTE — Assessment & Plan Note (Signed)
Anticipate worsening biceps tendonitis R arm and bilateral RTC tendonitis. He has not tolerated naprosyn, ibuprofen not achieving optimal effect. Will Rx prednisone taper in place of NSAID, add tramadol for breakthrough pain at night - discussed sedation precautions with this narcotic.  I did recommend SM referral to discuss further treatment, consider biceps tendon injection. Pt agrees with plan.

## 2018-05-15 NOTE — Progress Notes (Signed)
Dr. Frederico Hamman T. Kendle Erker, MD, [facility] [place], [address] Phone: [phone] Fax: [phone]  05/16/2018  Patient: Blake Peterson, MRN: [id number], DOB: Sep 05, 1941, 77 y.o.  Primary Physician:  Ria Bush, MD   Chief Complaint  Patient presents with  . Shoulder Pain    Bilateral   Subjective:   Blake Peterson is a 78 y.o. very pleasant male patient who presents with the following:  Pleasant patient of Dr. Darnell Level who is referred for evaluation of bilateral shoulder pain.  History is significant for 1 month history of ongoing shoulder pain, pain with movement and abduction, and he also has a significant history for diabetes mellitus type 2.  Started in the R shoulder and also in the L shoulder after. First showed up about three weeks ago. Mowed a yard and then came home and mowed with a tractor and hit him pretty hard and got worse and worse.  Took some motrin and then saw Dr. Kellie Shropshire him some SM handsouts and increased the mortin. Then placed on some prednisone.   He is feeling a lot better now.   L several years ago sprained RTC.   Past Medical History, Surgical History, Social History, Family History, Problem List, Medications, and Allergies have been reviewed and updated if relevant.  Patient Active Problem List   Diagnosis Date Noted  . Pedal edema 05/10/2018  . Bilateral shoulder pain 05/02/2018  . Left foot pain 11/15/2015  . 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 Arthralgia 03/22/2015  . Hypertensive retinopathy of both eyes, grade 1   . Health maintenance examination 08/10/2014  . Advanced care planning/counseling discussion 08/10/2014  . Bilateral hearing loss due to cerumen impaction 08/10/2014  . Tremor 09/09/2012  . Medicare annual wellness visit, subsequent 08/07/2011  . History of colonic polyps   . Diverticulosis of colon   . HTN (hypertension)   . HLD (hyperlipidemia)   . Controlled type 2  diabetes mellitus without complication, without long-term current use of insulin ([place]) 08/11/2010  . Vitamin D deficiency 08/07/2010  . 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 Elevated PSA 02/16/2007    Past Medical History:  Diagnosis Date  . Basal cell carcinoma 12/2016   R anterior tibia  . Cataract   . Diverticulosis of colon   . History of colonic polyps   . HLD (hyperlipidemia)   . HTN (hypertension)   . Hypertensive retinopathy of both eyes, grade 1    [name]  . 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 Prediabetes   . 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 CATARACT EXTRACTION, BILATERAL  06/2017  . COLONOSCOPY  11/2012   mild-mod diverticulosis, int hem, rec rpt 5 yrs [facility] Plan)  . COLONOSCOPY  01/2018   2 TA, mod diverticulosis, rpt 5 yrs [facility] Plan)  . POLYPECTOMY    . UMBILICAL HERNIA REPAIR  01/07/04    Social History   Socioeconomic History  . Marital status: Married    Spouse name: Not on file  . Number of children: 3  . Years of education: Not on file  . Highest education level: Not on file  Occupational History  . 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 Financial resource strain: Not on file  . Food insecurity:    Worry: Not on file    Inability: Not  on file  . Transportation needs:    Medical: Not on file    Non-medical: Not on file  Tobacco Use  . Smoking status: Former Smoker    Last attempt to quit: 11/10/1984    Years since quitting: 33.5  . Smokeless tobacco: Never Used  Substance and Sexual Activity  . Alcohol use: Yes    Alcohol/week: 0.0 standard drinks    Comment: occasional  . Drug use: No  . Sexual activity: Never  Lifestyle  . Physical activity:    Days per week: Not on file    Minutes per session: Not on file  . Stress: Not on file  Relationships  . Social connections:    Talks on phone: Not on file    Gets together: Not on  file    Attends religious service: Not on file    Active member of club or organization: Not on file    Attends meetings of clubs or organizations: Not on file    Relationship status: Not on file  . Intimate partner violence:    Fear of current or ex partner: Not on file    Emotionally abused: Not on file    Physically abused: Not on file    Forced sexual activity: Not on file  Other Topics Concern  . Not on file  Social History Narrative   Lives with wife   Occupation: retired, was Clinical biochemist   Activity: Electrical engineer, tries walking 3x/wk   Diet: good water, fruits/vegetables daily    Family History  Problem Relation Age of Onset  . Hypertension Sister   . Colon cancer Sister 40  . Colon polyps Sister   . Heart attack Father 16  . Esophageal cancer Neg Hx   . Rectal cancer Neg Hx   . Stomach cancer Neg Hx     Allergies  Allergen Reactions  . Naprosyn [Naproxen] Swelling    Fingers and ankle swelling    Medication list reviewed and updated in full in [place].  GEN: No fevers, chills. Nontoxic. Primarily MSK c/o today. MSK: Detailed in the HPI GI: tolerating PO intake without difficulty Neuro: No numbness, parasthesias, or tingling associated. Otherwise the pertinent positives of the ROS are noted above.   Objective:   BP 120/60   Pulse (!) 54   Temp 97.7 F (36.5 C) (Oral)   Ht 5\' 9"  (1.753 m)   Wt 208 lb 4 oz (94.5 kg)   BMI 30.75 kg/m    GEN: WDWN, NAD, Non-toxic, Alert & Oriented x 3 HEENT: Atraumatic, Normocephalic.  Ears and Nose: No external deformity. EXTR: No clubbing/cyanosis/edema NEURO: Normal gait.  PSYCH: Normally interactive. Conversant. Not depressed or anxious appearing.  Calm demeanor.   Shoulder: b Inspection: No muscle wasting or winging Ecchymosis/edema: neg  AC joint, scapula, clavicle: NT Cervical spine: NT, full ROM Spurling's: neg Abduction: full, 5/5 Flexion: full, 5/5 IR, full, lift-off: 5/5 ER at neutral:  full, 5/5 AC crossover and compression: neg Neer: neg Hawkins: neg Drop Test: neg Empty Can: neg Supraspinatus insertion: NT Bicipital groove: NT Speed's: neg Yergason's: neg Sulcus sign: neg Scapular dyskinesis: none C5-T1 intact Sensation intact Grip 5/5   Radiology: No results found.  Assessment and Plan:   Tendonitis of both rotator cuffs  I suspect he had more of an acute injury to both of his rotator cuff, but he is doing much better now after taking some oral prednisone.  At this point I cannot really provoke much symptoms.  He is active, he likes to play golf, so I gave him a golfers 10 rehab program which will work on some golf specific exercises, and also work on some rotator cuff and scapular stabilization.  I appreciate the opportunity to evaluate this very friendly patient. If you have any question regarding his care or prognosis, do not hesitate to ask.   Follow-up: prn only  Signed,  Praise Dolecki T. Brendi Mccarroll, MD   Allergies as of 05/16/2018      Reactions   Naprosyn [naproxen] Swelling   Fingers and ankle swelling      Medication List        Accurate as of 05/16/18 11:59 PM. Always use your most recent med list.          aspirin 81 MG tablet Take 81 mg by mouth daily.   atenolol 50 MG tablet Commonly known as:  TENORMIN TAKE 1 TABLET (50 MG TOTAL) BY MOUTH DAILY.   atorvastatin 80 MG tablet Commonly known as:  LIPITOR TAKE 1 TABLET (80 MG TOTAL) BY MOUTH AT BEDTIME.   chlorthalidone 25 MG tablet Commonly known as:  HYGROTON TAKE 1 TABLET (25 MG TOTAL) BY MOUTH DAILY.   cholecalciferol 1000 units tablet Commonly known as:  VITAMIN D Take 1,000 Units by mouth daily.   enalapril 10 MG tablet Commonly known as:  VASOTEC TAKE 1 TABLET (10 MG TOTAL) BY MOUTH 2 (TWO) TIMES DAILY.   predniSONE 20 MG tablet Commonly known as:  DELTASONE Take three tablets daily for 2 days followed by two tablet daily for 2 days followed by one tablet for 4  days   sildenafil 100 MG tablet Commonly known as:  VIAGRA Take 100 mg by mouth daily as needed.   traMADol 50 MG tablet Commonly known as:  ULTRAM Take 0.5-1 tablets (25-50 mg total) by mouth every 8 (eight) hours as needed.

## 2018-05-16 ENCOUNTER — Ambulatory Visit (INDEPENDENT_AMBULATORY_CARE_PROVIDER_SITE_OTHER): Payer: PPO | Admitting: Family Medicine

## 2018-05-16 VITALS — BP 120/60 | HR 54 | Temp 97.7°F | Ht 69.0 in | Wt 208.2 lb

## 2018-05-16 DIAGNOSIS — M7581 Other shoulder lesions, right shoulder: Secondary | ICD-10-CM

## 2018-05-16 DIAGNOSIS — M7582 Other shoulder lesions, left shoulder: Secondary | ICD-10-CM

## 2018-05-17 ENCOUNTER — Encounter: Payer: Self-pay | Admitting: Family Medicine

## 2018-06-13 ENCOUNTER — Ambulatory Visit (INDEPENDENT_AMBULATORY_CARE_PROVIDER_SITE_OTHER)
Admission: RE | Admit: 2018-06-13 | Discharge: 2018-06-13 | Disposition: A | Discharge status: Home / Self Care | Payer: PPO | Source: Ambulatory Visit | Attending: Family Medicine | Admitting: Family Medicine

## 2018-06-13 ENCOUNTER — Encounter: Payer: Self-pay | Admitting: Family Medicine

## 2018-06-13 ENCOUNTER — Ambulatory Visit (INDEPENDENT_AMBULATORY_CARE_PROVIDER_SITE_OTHER): Payer: PPO | Admitting: Family Medicine

## 2018-06-13 VITALS — BP 122/70 | HR 62 | Temp 97.8°F | Ht 69.0 in | Wt 211.2 lb

## 2018-06-13 DIAGNOSIS — M79604 Pain in right leg: Secondary | ICD-10-CM | POA: Diagnosis not present

## 2018-06-13 DIAGNOSIS — M25511 Pain in right shoulder: Secondary | ICD-10-CM | POA: Diagnosis not present

## 2018-06-13 DIAGNOSIS — M25512 Pain in left shoulder: Secondary | ICD-10-CM | POA: Diagnosis not present

## 2018-06-13 DIAGNOSIS — I10 Essential (primary) hypertension: Secondary | ICD-10-CM

## 2018-06-13 DIAGNOSIS — E119 Type 2 diabetes mellitus without complications: Secondary | ICD-10-CM

## 2018-06-13 DIAGNOSIS — S4992XA Unspecified injury of left shoulder and upper arm, initial encounter: Secondary | ICD-10-CM | POA: Diagnosis not present

## 2018-06-13 DIAGNOSIS — M2550 Pain in unspecified joint: Secondary | ICD-10-CM

## 2018-06-13 DIAGNOSIS — M79606 Pain in leg, unspecified: Secondary | ICD-10-CM

## 2018-06-13 DIAGNOSIS — M79605 Pain in left leg: Secondary | ICD-10-CM | POA: Diagnosis not present

## 2018-06-13 DIAGNOSIS — R600 Localized edema: Secondary | ICD-10-CM

## 2018-06-13 LAB — COMPREHENSIVE METABOLIC PANEL
ALK PHOS: 91 U/L (ref 39–117)
ALT: 18 U/L (ref 0–53)
AST: 16 U/L (ref 0–37)
Albumin: 4 g/dL (ref 3.5–5.2)
BUN: 21 mg/dL (ref 6–23)
CO2: 30 mEq/L (ref 19–32)
Calcium: 10 mg/dL (ref 8.4–10.5)
Chloride: 95 mEq/L — ABNORMAL LOW (ref 96–112)
Creatinine, Ser: 0.89 mg/dL (ref 0.40–1.50)
GFR: 88.41 mL/min (ref >60.00)
GLUCOSE: 157 mg/dL — AB (ref 70–99)
Potassium: 3.3 mEq/L — ABNORMAL LOW (ref 3.5–5.1)
Sodium: 134 mEq/L — ABNORMAL LOW (ref 135–145)
TOTAL PROTEIN: 7.5 g/dL (ref 6.0–8.3)
Total Bilirubin: 0.8 mg/dL (ref 0.2–1.2)

## 2018-06-13 LAB — CBC WITH DIFFERENTIAL/PLATELET
BASOS ABS: 0.1 10*3/uL (ref 0.0–0.1)
Basophils Relative: 0.6 % (ref 0.0–3.0)
EOS ABS: 0.1 10*3/uL (ref 0.0–0.7)
EOS PCT: 0.5 % (ref 0.0–5.0)
HCT: 42.7 % (ref 39.0–52.0)
HEMOGLOBIN: 15 g/dL (ref 13.0–17.0)
Lymphocytes Relative: 14.9 % (ref 12.0–46.0)
Lymphs Abs: 1.6 10*3/uL (ref 0.7–4.0)
MCHC: 35 g/dL (ref 30.0–36.0)
MCV: 86.1 fl (ref 78.0–100.0)
MONO ABS: 1 10*3/uL (ref 0.1–1.0)
Monocytes Relative: 9.3 % (ref 3.0–12.0)
Neutro Abs: 8.2 10*3/uL — ABNORMAL HIGH (ref 1.4–7.7)
Neutrophils Relative %: 74.7 % (ref 43.0–77.0)
Platelets: 346 10*3/uL (ref 150.0–400.0)
RBC: 4.96 Mil/uL (ref 4.22–5.81)
RDW: 13 % (ref 11.5–15.5)
WBC: 11 10*3/uL — AB (ref 4.0–10.5)

## 2018-06-13 LAB — HEMOGLOBIN A1C: HEMOGLOBIN A1C: 6.9 % — AB (ref 4.6–6.5)

## 2018-06-13 LAB — HIGH SENSITIVITY CRP: CRP, High Sensitivity: 12.55 mg/L — ABNORMAL HIGH (ref 0.000–5.000)

## 2018-06-13 LAB — SEDIMENTATION RATE: SED RATE: 74 mm/h — AB (ref 0–20)

## 2018-06-13 LAB — TSH: TSH: 2.04 u[IU]/mL (ref 0.35–4.50)

## 2018-06-13 NOTE — Assessment & Plan Note (Signed)
?  PMR - check ESR/CRP today.

## 2018-06-13 NOTE — Assessment & Plan Note (Signed)
L>R. Nonspecific lower extremity pain, no significant pain at hip girdle. Benign exam today except for pedal edema. Await labs.

## 2018-06-13 NOTE — Patient Instructions (Addendum)
Xray today Labs today.  We will be in touch with results.  Possible polymyalgia.  Try to back off anti inflammatories - likely causing worsening leg swelling.

## 2018-06-13 NOTE — Progress Notes (Signed)
BP 122/70 (BP Location: Right Arm, Patient Position: Sitting, Cuff Size: Normal)   Pulse 62   Temp 97.8 F (36.6 C) (Oral)   Ht '5\' 9"'  (1.753 m)   Wt 211 lb 4 oz (95.8 kg)   SpO2 97%   BMI 31.20 kg/m    CC: bilateral leg pain Subjective:    Patient ID: ERIS BRECK, male    DOB: 1941/09/11, 77 y.o.   MRN: [id number]  HPI: ERCIL CASSIS is a 77 y.o. male presenting on 06/13/2018 for Leg Pain (C/o bilateral lower leg pain radiating to bottom of both feet. Says pain is worse in left ankle and today has pain in anterior left shoulder. Tried exercises provided by Dr. Darnell Level but not helping much. But predinsone helped.)   Seen last month with L then bilateral shoulder pain thought RTC tendonitis that significantly improved with prednisone course. Saw sports med Dr Lorelei Pont at which time symptoms were largely stable. Trouble tolerating NSAIDs due to pedal edema.   Bilateral shoulder pain L>R started returning over last several weeks. Denies inciting trauma or injury.  1 wk h/o L>R leg pain starts behind knee, travels to feet. Occasional L groin pain especially noted when getting into car.  Last week pedal edema worsened.  Feels cold easily Denies other significant arthralgias.   He has been taking ibuprofen 9 tablets total last few days due to pain.  Known diet controlled diabetic. Sugars running 110-120s fasting.  Lab Results  Component Value Date   HGBA1C 7.0 (H) 03/04/2018     Relevant past medical, surgical, family and social history reviewed and updated as indicated. Interim medical history since our last visit reviewed. Allergies and medications reviewed and updated. Outpatient Medications Prior to Visit  Medication Sig Dispense Refill  . aspirin 81 MG tablet Take 81 mg by mouth daily.      Marland Kitchen atenolol (TENORMIN) 50 MG tablet TAKE 1 TABLET (50 MG TOTAL) BY MOUTH DAILY. 90 tablet 3  . atorvastatin (LIPITOR) 80 MG tablet TAKE 1 TABLET (80 MG TOTAL) BY MOUTH AT BEDTIME. 90 tablet  1  . chlorthalidone (HYGROTON) 25 MG tablet TAKE 1 TABLET (25 MG TOTAL) BY MOUTH DAILY. 90 tablet 2  . cholecalciferol (VITAMIN D) 1000 UNITS tablet Take 1,000 Units by mouth daily.      . enalapril (VASOTEC) 10 MG tablet TAKE 1 TABLET (10 MG TOTAL) BY MOUTH 2 (TWO) TIMES DAILY. 180 tablet 1  . sildenafil (VIAGRA) 100 MG tablet Take 100 mg by mouth daily as needed.      . predniSONE (DELTASONE) 20 MG tablet Take three tablets daily for 2 days followed by two tablet daily for 2 days followed by one tablet for 4 days 14 tablet 0  . traMADol (ULTRAM) 50 MG tablet Take 0.5-1 tablets (25-50 mg total) by mouth every 8 (eight) hours as needed. 20 tablet 0   Facility-Administered Medications Prior to Visit  Medication Dose Route Frequency Provider Last Rate Last Dose  . 0.9 %  sodium chloride infusion  500 mL Intravenous Once Ladene Artist, MD         Per HPI unless specifically indicated in ROS section below Review of Systems     Objective:    BP 122/70 (BP Location: Right Arm, Patient Position: Sitting, Cuff Size: Normal)   Pulse 62   Temp 97.8 F (36.6 C) (Oral)   Ht '5\' 9"'  (1.753 m)   Wt 211 lb 4 oz (95.8 kg)   SpO2  97%   BMI 31.20 kg/m   Wt Readings from Last 3 Encounters:  06/13/18 211 lb 4 oz (95.8 kg)  05/16/18 208 lb 4 oz (94.5 kg)  05/09/18 210 lb 4 oz (95.4 kg)    Physical Exam  Constitutional: He appears well-developed and well-nourished. No distress.  HENT:  Mouth/Throat: Oropharynx is clear and moist. No oropharyngeal exudate.  Cardiovascular: Normal rate, regular rhythm and normal heart sounds.  No murmur heard. Pulmonary/Chest: Effort normal and breath sounds normal. No respiratory distress. He has no wheezes. He has no rales.  Musculoskeletal: Normal range of motion. He exhibits edema (1+ bilateral to mid calf). He exhibits no tenderness.  No midline or paracervical mm or trapezius mm tenderness. Holds arm externally rotated. Tender with impingement testing and  with SITS testing. Tender with abduction to 90 degrees when arm is externally rotated, limited mobility.  FROM at bilateral knees and hips without pain No popliteal fullness or palpable cords.   Neurological: He is alert.  Skin: Skin is warm and dry. No rash noted.  Psychiatric: He has a normal mood and affect.  Nursing note and vitals reviewed.  Results for orders placed or performed in visit on 03/11/18  HM DIABETES EYE EXAM  Result Value Ref Range   HM Diabetic Eye Exam No Retinopathy No Retinopathy   Lab Results  Component Value Date   WBC 10.2 09/07/2017   HGB 15.9 09/07/2017   HCT 46.7 09/07/2017   MCV 91.4 09/07/2017   PLT 247.0 09/07/2017    Lab Results  Component Value Date   CREATININE 0.87 03/04/2018   BUN 21 03/04/2018   NA 136 03/04/2018   K 3.9 03/04/2018   CL 97 03/04/2018   CO2 30 03/04/2018       Assessment & Plan:   Problem List Items Addressed This Visit    Pedal edema    Anticipate NSAID effect - encouraged limiting ibuprofen.       Relevant Orders   CBC with Differential/Platelet   Comprehensive metabolic panel   Leg pain    L>R. Nonspecific lower extremity pain, no significant pain at hip girdle. Benign exam today except for pedal edema. Await labs.       HTN (hypertension)   Relevant Orders   TSH   Controlled type 2 diabetes mellitus without complication, without long-term current use of insulin ([facility])    Update A1c today. He did complete prednisone taper last month. If we end up starting long term prednisone, discussed would also need daily diabetic regimen. Will await labs.       Relevant Orders   Hemoglobin A1c   Bilateral shoulder pain - Primary    L>R. He has signs of RTC tendinopathy as well as impingement. Check xray today and labs to further eval for PMR. Manage accordingly.       Relevant Orders   DG Shoulder Left   Sedimentation rate   High sensitivity CRP   CBC with Differential/Platelet   Arthralgia    ?PMR - check  ESR/CRP today.           No orders of the defined types were placed in this encounter.  Orders Placed This Encounter  Procedures  . DG Shoulder Left    Standing Status:   Future    Number of Occurrences:   1    Standing Expiration Date:   08/14/2019    Order Specific Question:   Reason for Exam (SYMPTOM  OR DIAGNOSIS REQUIRED)  Answer:   L shoulder pain for 1+ month    Order Specific Question:   Preferred imaging location?    Answer:   [facility]    Order Specific Question:   Radiology Contrast Protocol - do NOT remove file path    Answer:   [email or web address]  . Sedimentation rate  . High sensitivity CRP  . Hemoglobin A1c  . CBC with Differential/Platelet  . TSH  . Comprehensive metabolic panel    Follow up plan: No follow-ups on file.  Ria Bush, MD

## 2018-06-13 NOTE — Assessment & Plan Note (Signed)
L>R. He has signs of RTC tendinopathy as well as impingement. Check xray today and labs to further eval for PMR. Manage accordingly.

## 2018-06-13 NOTE — Assessment & Plan Note (Signed)
Anticipate NSAID effect - encouraged limiting ibuprofen.

## 2018-06-13 NOTE — Assessment & Plan Note (Addendum)
Update A1c today. He did complete prednisone taper last month. If we end up starting long term prednisone, discussed would also need daily diabetic regimen. Will await labs.

## 2018-06-15 ENCOUNTER — Other Ambulatory Visit: Payer: Self-pay | Admitting: Family Medicine

## 2018-06-15 ENCOUNTER — Encounter: Payer: Self-pay | Admitting: Family Medicine

## 2018-06-15 DIAGNOSIS — I700 Atherosclerosis of aorta: Secondary | ICD-10-CM | POA: Insufficient documentation

## 2018-06-15 DIAGNOSIS — M353 Polymyalgia rheumatica: Secondary | ICD-10-CM

## 2018-06-15 MED ORDER — GLIMEPIRIDE 1 MG PO TABS: 1.0000 mg | ORAL_TABLET | Freq: Every day | ORAL | 1 refills | Status: DC

## 2018-06-15 MED ORDER — POTASSIUM CHLORIDE ER 10 MEQ PO TBCR: 10.0000 meq | EXTENDED_RELEASE_TABLET | Freq: Every day | ORAL | 1 refills | Status: DC

## 2018-06-15 MED ORDER — PREDNISONE 20 MG PO TABS: 20.0000 mg | ORAL_TABLET | Freq: Every day | ORAL | 1 refills | Status: DC

## 2018-06-22 ENCOUNTER — Telehealth: Payer: Self-pay | Admitting: Family Medicine

## 2018-06-22 NOTE — Telephone Encounter (Signed)
Spoke with pt relaying Dr. Synthia Innocent message and instructions.  Pt verbalizes understanding.

## 2018-06-22 NOTE — Telephone Encounter (Signed)
Pt last seen 06/13/18.

## 2018-06-22 NOTE — Telephone Encounter (Signed)
Copied from [place] [id number]. Topic: General - Other >> Jun 22, 2018  8:05 AM Cecelia Byars, NT wrote: Reason for CRM: Patient called and said  the prednisone is working it  ,has also  taken away the pain he was  Having and   the sugar pill is working ok , he is able to exercise a little now and to move his joints  . Please call him at [phone], he also states he is sleeping better

## 2018-06-22 NOTE — Telephone Encounter (Signed)
Thank you for the update. Good to hear.  Continue prednisone 20mg  daily, keep f/u at end of the month, we will try to start slow taper at that time. Let us know if symptoms worsen sooner.

## 2018-06-23 ENCOUNTER — Ambulatory Visit (INDEPENDENT_AMBULATORY_CARE_PROVIDER_SITE_OTHER): Payer: PPO

## 2018-06-23 DIAGNOSIS — Z23 Encounter for immunization: Secondary | ICD-10-CM | POA: Diagnosis not present

## 2018-07-18 ENCOUNTER — Ambulatory Visit (INDEPENDENT_AMBULATORY_CARE_PROVIDER_SITE_OTHER): Payer: PPO | Admitting: Family Medicine

## 2018-07-18 ENCOUNTER — Encounter: Payer: Self-pay | Admitting: Family Medicine

## 2018-07-18 VITALS — BP 128/70 | HR 56 | Temp 97.8°F | Ht 69.0 in | Wt 210.2 lb

## 2018-07-18 DIAGNOSIS — E119 Type 2 diabetes mellitus without complications: Secondary | ICD-10-CM | POA: Diagnosis not present

## 2018-07-18 DIAGNOSIS — M353 Polymyalgia rheumatica: Secondary | ICD-10-CM

## 2018-07-18 LAB — HIGH SENSITIVITY CRP: CRP HIGH SENSITIVITY: 0.52 mg/L (ref 0.000–5.000)

## 2018-07-18 LAB — SEDIMENTATION RATE: SED RATE: 11 mm/h (ref 0–20)

## 2018-07-18 MED ORDER — PREDNISONE 10 MG PO TABS: 15.0000 mg | ORAL_TABLET | Freq: Every day | ORAL | 0 refills | Status: DC

## 2018-07-18 MED ORDER — PREDNISONE 2.5 MG PO TABS: 12.5000 mg | ORAL_TABLET | Freq: Every day | ORAL | 1 refills | Status: DC

## 2018-07-18 NOTE — Assessment & Plan Note (Signed)
Very good sugar readings with addition of amaryl 1mg  daily despite prednisone use. Discussed monitoring for hypoglycemia and plan if this happens. Pt agrees with plan.

## 2018-07-18 NOTE — Assessment & Plan Note (Signed)
MARKED improvement after starting prednisone points towards PMR. Check inflammatory markers today and if normal, this would further confirm correct diagnosis. Denies GCA symptoms. Discussed slow taper over the several months as per instructions. Will take 15mg  x1 mo then 12.5mg  x1 mo then 10mg  x1 month, then continue slow monthly taper by 1 mg. RTC 3-4 mo f/u visit.

## 2018-07-18 NOTE — Progress Notes (Signed)
BP 128/70 (BP Location: Left Arm, Patient Position: Sitting, Cuff Size: Normal)   Pulse (!) 56   Temp 97.8 F (36.6 C) (Oral)   Ht '5\' 9"'  (1.753 m)   Wt 210 lb 4 oz (95.4 kg)   SpO2 98%   BMI 31.05 kg/m    CC: f/u visit PMR Subjective:    Patient ID: Blake Peterson, male    DOB: 1941-01-24, 77 y.o.   MRN: [id number]  HPI: Blake Peterson is a 77 y.o. male presenting on 07/18/2018 for Follow-up (Here for f/u on prednisone, potassium and discuss DM meds.)   See prior note for details.  Dx PMR last visit with ESR 74, CRP 12.5 (bilateral shoulder pain and some L groin pain, marked improvement with prednisone course). Started on prednisone 66m daily. No further pain while on prednisone dose.   DM - we added glimepiride with breakfast after starting prednisone. Has been checking sugars (prior 110-120s fasting). Since prednisone started, he has actually noted a drop in sugars to 90-100. This morning fasting sugar was 80.   Relevant past medical, surgical, family and social history reviewed and updated as indicated. Interim medical history since our last visit reviewed. Allergies and medications reviewed and updated. Outpatient Medications Prior to Visit  Medication Sig Dispense Refill  . aspirin 81 MG tablet Take 81 mg by mouth daily.      .Marland Kitchenatenolol (TENORMIN) 50 MG tablet TAKE 1 TABLET (50 MG TOTAL) BY MOUTH DAILY. 90 tablet 3  . atorvastatin (LIPITOR) 80 MG tablet TAKE 1 TABLET (80 MG TOTAL) BY MOUTH AT BEDTIME. 90 tablet 1  . chlorthalidone (HYGROTON) 25 MG tablet TAKE 1 TABLET (25 MG TOTAL) BY MOUTH DAILY. 90 tablet 2  . cholecalciferol (VITAMIN D) 1000 UNITS tablet Take 1,000 Units by mouth daily.      . enalapril (VASOTEC) 10 MG tablet TAKE 1 TABLET (10 MG TOTAL) BY MOUTH 2 (TWO) TIMES DAILY. 180 tablet 1  . glimepiride (AMARYL) 1 MG tablet Take 1 tablet (1 mg total) by mouth daily with breakfast. 90 tablet 1  . potassium chloride (K-DUR) 10 MEQ tablet Take 1 tablet (10 mEq  total) by mouth daily. 90 tablet 1  . sildenafil (VIAGRA) 100 MG tablet Take 100 mg by mouth daily as needed.      . predniSONE (DELTASONE) 20 MG tablet Take 1 tablet (20 mg total) by mouth daily with breakfast. 30 tablet 1   Facility-Administered Medications Prior to Visit  Medication Dose Route Frequency Provider Last Rate Last Dose  . 0.9 %  sodium chloride infusion  500 mL Intravenous Once SLadene Artist MD         Per HPI unless specifically indicated in ROS section below Review of Systems     Objective:    BP 128/70 (BP Location: Left Arm, Patient Position: Sitting, Cuff Size: Normal)   Pulse (!) 56   Temp 97.8 F (36.6 C) (Oral)   Ht '5\' 9"'  (1.753 m)   Wt 210 lb 4 oz (95.4 kg)   SpO2 98%   BMI 31.05 kg/m   Wt Readings from Last 3 Encounters:  07/18/18 210 lb 4 oz (95.4 kg)  06/13/18 211 lb 4 oz (95.8 kg)  05/16/18 208 lb 4 oz (94.5 kg)    Physical Exam  Constitutional: He appears well-developed and well-nourished. No distress.  Musculoskeletal: Normal range of motion.  FROM at shoulders without pain.  Skin: Skin is warm and dry. No rash noted.  Psychiatric: He has a normal mood and affect.  Nursing note and vitals reviewed.  Results for orders placed or performed in visit on 06/13/18  Sedimentation rate  Result Value Ref Range   Sed Rate 74 (H) 0 - 20 mm/hr  High sensitivity CRP  Result Value Ref Range   CRP, High Sensitivity 12.550 (H) 0.000 - 5.000 mg/L  Hemoglobin A1c  Result Value Ref Range   Hgb A1c MFr Bld 6.9 (H) 4.6 - 6.5 %  CBC with Differential/Platelet  Result Value Ref Range   WBC 11.0 (H) 4.0 - 10.5 K/uL   RBC 4.96 4.22 - 5.81 Mil/uL   Hemoglobin 15.0 13.0 - 17.0 g/dL   HCT 42.7 39.0 - 52.0 %   MCV 86.1 78.0 - 100.0 fl   MCHC 35.0 30.0 - 36.0 g/dL   RDW 13.0 11.5 - 15.5 %   Platelets 346.0 150.0 - 400.0 K/uL   Neutrophils Relative % 74.7 43.0 - 77.0 %   Lymphocytes Relative 14.9 12.0 - 46.0 %   Monocytes Relative 9.3 3.0 - 12.0 %    Eosinophils Relative 0.5 0.0 - 5.0 %   Basophils Relative 0.6 0.0 - 3.0 %   Neutro Abs 8.2 (H) 1.4 - 7.7 K/uL   Lymphs Abs 1.6 0.7 - 4.0 K/uL   Monocytes Absolute 1.0 0.1 - 1.0 K/uL   Eosinophils Absolute 0.1 0.0 - 0.7 K/uL   Basophils Absolute 0.1 0.0 - 0.1 K/uL  TSH  Result Value Ref Range   TSH 2.04 0.35 - 4.50 uIU/mL  Comprehensive metabolic panel  Result Value Ref Range   Sodium 134 (L) 135 - 145 mEq/L   Potassium 3.3 (L) 3.5 - 5.1 mEq/L   Chloride 95 (L) 96 - 112 mEq/L   CO2 30 19 - 32 mEq/L   Glucose, Bld 157 (H) 70 - 99 mg/dL   BUN 21 6 - 23 mg/dL   Creatinine, Ser 0.89 0.40 - 1.50 mg/dL   Total Bilirubin 0.8 0.2 - 1.2 mg/dL   Alkaline Phosphatase 91 39 - 117 U/L   AST 16 0 - 37 U/L   ALT 18 0 - 53 U/L   Total Protein 7.5 6.0 - 8.3 g/dL   Albumin 4.0 3.5 - 5.2 g/dL   Calcium 10.0 8.4 - 10.5 mg/dL   GFR 88.41 >60.00 mL/min  DG Shoulder Left CLINICAL DATA:  LEFT shoulder pain for over 1 month  EXAM: LEFT SHOULDER - 2+ VIEW  COMPARISON:  None  FINDINGS: Osseous mineralization normal.  AC joint alignment normal.  No acute fracture, dislocation or bone destruction.  Visualized LEFT ribs intact.  Atherosclerotic calcification aorta.  IMPRESSION: No acute osseous abnormalities.  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  Electronically Signed   By: Lavonia Dana M.D.   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 Check inflammatory markers today and if normal, this would further confirm correct diagnosis. Denies GCA symptoms. Discussed slow taper over the several months as per instructions. Will take 65m x1 mo then 12.534mx1 mo then 1072m1 month, then continue slow monthly taper by 1 mg. RTC 3-4 mo f/u visit.       Relevant Orders   Sedimentation rate   High sensitivity CRP   Controlled type 2 diabetes  mellitus without complication, without long-term current use of insulin [place]  Very good sugar readings with addition of amaryl 84m daily despite prednisone use. Discussed monitoring for hypoglycemia and plan if this happens. Pt agrees with plan.           Meds ordered this encounter  Medications  . predniSONE (DELTASONE) 10 MG tablet    Sig: Take 1.5 tablets (15 mg total) by mouth daily with breakfast. For 1 month    Dispense:  30 tablet    Refill:  0  . predniSONE (DELTASONE) 2.5 MG tablet    Sig: Take 5 tablets (12.5 mg total) by mouth daily with breakfast.    Dispense:  150 tablet    Refill:  1   Orders Placed This Encounter  Procedures  . Sedimentation rate  . High sensitivity CRP    Follow up plan: Return in about 4 months (around 11/18/2018) for follow up visit.  JRia Bush MD

## 2018-07-18 NOTE — Patient Instructions (Addendum)
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  We will start taper of prednisone -next month's dose will be 15mg  daily. After than do 12.5mg  daily for a month (Rx printed today), then 10mg  for a month, then update me for next prescription.  Continue glimepiride daily with breakfast, decrease dose or stop if any sugars <70.  Return in 3-4 months for follow up visit  Polymyalgia Rheumatica Polymyalgia rheumatica (PMR) is an inflammatory disorder that causes aching and stiffness in your muscles and joints. Sometimes, PMR leads to a more dangerous condition (temporal arteritis or giant cell arteritis), which can cause vision loss. What are the causes? The exact cause of PMR is not known. What increases the risk? This condition is more likely to develop in:  Females.  People who are 75 years of age or older.  Caucasians.  What are the signs or symptoms?  Pain and stiffness are the main symptoms of PMR. Symptoms may start slowly or suddenly. The symptoms:  May be worse after inactivity and in the morning.  May affect your: ? Hips, buttocks, and thighs. ? Neck, arms, and shoulders. This can make it hard to raise your arms above your head. ? Hands and wrists.  Other symptoms include:  Fever.  Tiredness.  Weakness.  Decreased appetite. This may lead to weight loss.  How is this diagnosed? This condition is diagnosed with a medical history and physical exam. You may need to see a health care provider who specializes in diseases of the joint, muscles, and bones (rheumatologist). You may also have tests, including:  Blood tests.  X-rays.  How is this treated? PMR usually goes away without treatment, but it may take years for that to happen. In the meantime, your health care provider may recommend low-dose steroids to help manage your symptoms of pain and stiffness. Regular exercise and rest will also help your symptoms. Follow these instructions at home:  Take over-the-counter and  prescription medicines only as told by your health care provider.  Make sure to get enough rest and sleep.  Eat a healthy and nutritious diet.  Try to exercise most days of the week. Ask your health care provider what type of exercise is best for you.  Keep all follow-up visits as told by your health are provider. This is important. Contact a health care provider if:  Your symptoms are not controlled with medicine.  You have side effects from steroids. These may include: ? Weight gain. ? Swelling. ? Insomnia. ? Mood changes. ? Bruising. ? High blood sugar readings, if you have diabetes. ? Higher than normal blood pressure readings, if you monitor your blood pressure. Get help right away if:  You develop symptoms of temporal arteritis, such as: ? A change in vision. ? Severe headache. ? Scalp pain. ? Jaw pain. This information is not intended to replace advice given to you by your health care provider. Make sure you discuss any questions you have with your health care provider. Document Released: 10/15/2004 Document Revised: 02/13/2016 Document Reviewed: 03/20/2015 Elsevier Interactive Patient Education  [employer].

## 2018-08-05 ENCOUNTER — Other Ambulatory Visit: Payer: Self-pay | Admitting: Family Medicine

## 2018-08-17 ENCOUNTER — Encounter: Payer: Self-pay | Admitting: Family Medicine

## 2018-08-17 DIAGNOSIS — Z9841 Cataract extraction status, right eye: Secondary | ICD-10-CM | POA: Diagnosis not present

## 2018-08-17 DIAGNOSIS — H04123 Dry eye syndrome of bilateral lacrimal glands: Secondary | ICD-10-CM | POA: Diagnosis not present

## 2018-08-17 DIAGNOSIS — Z9842 Cataract extraction status, left eye: Secondary | ICD-10-CM | POA: Diagnosis not present

## 2018-08-17 DIAGNOSIS — H0288A Meibomian gland dysfunction right eye, upper and lower eyelids: Secondary | ICD-10-CM | POA: Diagnosis not present

## 2018-08-17 DIAGNOSIS — H52223 Regular astigmatism, bilateral: Secondary | ICD-10-CM | POA: Diagnosis not present

## 2018-08-17 DIAGNOSIS — H35033 Hypertensive retinopathy, bilateral: Secondary | ICD-10-CM | POA: Diagnosis not present

## 2018-08-17 DIAGNOSIS — H0288B Meibomian gland dysfunction left eye, upper and lower eyelids: Secondary | ICD-10-CM | POA: Diagnosis not present

## 2018-08-17 DIAGNOSIS — I10 Essential (primary) hypertension: Secondary | ICD-10-CM | POA: Diagnosis not present

## 2018-08-17 LAB — HM DIABETES EYE EXAM

## 2018-08-23 ENCOUNTER — Other Ambulatory Visit: Payer: Self-pay | Admitting: Family Medicine

## 2018-08-31 ENCOUNTER — Telehealth: Payer: Self-pay | Admitting: Family Medicine

## 2018-08-31 NOTE — Telephone Encounter (Signed)
Left message asking pt to call office please r/s his 12/24 appointment with dr g

## 2018-09-12 ENCOUNTER — Ambulatory Visit: Payer: PPO

## 2018-09-13 ENCOUNTER — Encounter: Payer: PPO | Admitting: Family Medicine

## 2018-09-13 ENCOUNTER — Other Ambulatory Visit: Payer: Self-pay | Admitting: Family Medicine

## 2018-09-13 DIAGNOSIS — E785 Hyperlipidemia, unspecified: Secondary | ICD-10-CM

## 2018-09-13 DIAGNOSIS — E119 Type 2 diabetes mellitus without complications: Secondary | ICD-10-CM

## 2018-09-13 DIAGNOSIS — E559 Vitamin D deficiency, unspecified: Secondary | ICD-10-CM

## 2018-09-13 DIAGNOSIS — R972 Elevated prostate specific antigen [PSA]: Secondary | ICD-10-CM

## 2018-09-13 DIAGNOSIS — I10 Essential (primary) hypertension: Secondary | ICD-10-CM

## 2018-09-16 ENCOUNTER — Ambulatory Visit (INDEPENDENT_AMBULATORY_CARE_PROVIDER_SITE_OTHER): Payer: PPO

## 2018-09-16 VITALS — BP 126/74 | HR 61 | Temp 97.7°F | Ht 68.5 in | Wt 224.0 lb

## 2018-09-16 DIAGNOSIS — Z0000 Encounter for general adult medical examination without abnormal findings: Secondary | ICD-10-CM | POA: Diagnosis not present

## 2018-09-16 DIAGNOSIS — E119 Type 2 diabetes mellitus without complications: Secondary | ICD-10-CM | POA: Diagnosis not present

## 2018-09-16 DIAGNOSIS — R972 Elevated prostate specific antigen [PSA]: Secondary | ICD-10-CM | POA: Diagnosis not present

## 2018-09-16 DIAGNOSIS — E785 Hyperlipidemia, unspecified: Secondary | ICD-10-CM | POA: Diagnosis not present

## 2018-09-16 DIAGNOSIS — E559 Vitamin D deficiency, unspecified: Secondary | ICD-10-CM

## 2018-09-16 LAB — PSA: PSA: 6.72 ng/mL — ABNORMAL HIGH (ref 0.10–4.00)

## 2018-09-16 LAB — COMPREHENSIVE METABOLIC PANEL
ALT: 32 U/L (ref 0–53)
AST: 21 U/L (ref 0–37)
Albumin: 4 g/dL (ref 3.5–5.2)
Alkaline Phosphatase: 55 U/L (ref 39–117)
BILIRUBIN TOTAL: 1.1 mg/dL (ref 0.2–1.2)
BUN: 20 mg/dL (ref 6–23)
CO2: 32 mEq/L (ref 19–32)
CREATININE: 0.85 mg/dL (ref 0.40–1.50)
Calcium: 10.2 mg/dL (ref 8.4–10.5)
Chloride: 93 mEq/L — ABNORMAL LOW (ref 96–112)
GFR: 93.17 mL/min (ref >60.00)
Glucose, Bld: 82 mg/dL (ref 70–99)
Potassium: 3.7 mEq/L (ref 3.5–5.1)
Sodium: 135 mEq/L (ref 135–145)
Total Protein: 6.7 g/dL (ref 6.0–8.3)

## 2018-09-16 LAB — HEMOGLOBIN A1C: Hgb A1c MFr Bld: 6.8 % — ABNORMAL HIGH (ref 4.6–6.5)

## 2018-09-16 LAB — VITAMIN D 25 HYDROXY (VIT D DEFICIENCY, FRACTURES): VITD: 43.01 ng/mL (ref 30.00–100.00)

## 2018-09-16 LAB — LIPID PANEL
Cholesterol: 162 mg/dL (ref 0–200)
HDL: 61.8 mg/dL (ref >39.00)
LDL Cholesterol: 69 mg/dL (ref 0–99)
NonHDL: 100.04
Total CHOL/HDL Ratio: 3
Triglycerides: 156 mg/dL — ABNORMAL HIGH (ref 0.0–149.0)
VLDL: 31.2 mg/dL (ref 0.0–40.0)

## 2018-09-16 LAB — MICROALBUMIN / CREATININE URINE RATIO
Creatinine,U: 93.9 mg/dL
Microalb Creat Ratio: 0.8 mg/g (ref 0.0–30.0)
Microalb, Ur: 0.8 mg/dL (ref 0.0–1.9)

## 2018-09-16 NOTE — Progress Notes (Signed)
Subjective:   Blake Peterson is a 77 y.o. male who presents for Medicare Annual/Subsequent preventive examination.  Review of Systems:  N/A Cardiac Risk Factors include: advanced age (>13men, >65 women);obesity (BMI >30kg/m2);dyslipidemia;hypertension;male gender     Objective:    Vitals: BP 126/74 (BP Location: Right Arm, Patient Position: Sitting, Cuff Size: Normal)   Pulse 61   Temp 97.7 F (36.5 C) (Oral)   Ht 5' 8.5" (1.74 m)   Wt 224 lb (101.6 kg)   SpO2 97%   BMI 33.56 kg/m   Body mass index is 33.56 kg/m.  Advanced Directives 09/16/2018 02/09/2018 08/27/2016  Does Patient Have a Medical Advance Directive? Yes No No  Type of Blake Peterson;Living will - -  Copy of Blake Peterson in Chart? No - copy requested - -    Tobacco Social History   Tobacco Use  Smoking Status Former Smoker  . Last attempt to quit: 11/10/1984  . Years since quitting: 33.8  Smokeless Tobacco Never Used     Counseling given: No   Clinical Intake:  Pre-visit preparation completed: Yes  Pain : No/denies pain Pain Score: 0-No pain     Nutritional Status: BMI > 30  Obese Nutritional Risks: None Diabetes: No  How often do you need to have someone help you when you read instructions, pamphlets, or other written materials from your doctor or pharmacy?: 1 - Never What is the last grade level you completed in school?: 12th grade  Interpreter Needed?: No  Comments: pt lives with spouse Information entered by :: LPinson, Blake Peterson  Past Medical History:  Diagnosis Date  . Basal cell carcinoma 12/2016   R anterior tibia  . Cataract   . Diverticulosis of colon   . History of colonic polyps   . HLD (hyperlipidemia)   . HTN (hypertension)   . Hypertensive retinopathy of both eyes, grade 1    Blake Peterson  . 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 Prediabetes   . 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 CATARACT EXTRACTION, BILATERAL  06/2017  . COLONOSCOPY  11/2012   mild-mod diverticulosis, int hem, rec rpt 5 yrs Blake Peterson)  . COLONOSCOPY  01/2018   2 TA, mod diverticulosis, rpt 5 yrs Blake Peterson)  . POLYPECTOMY    . UMBILICAL HERNIA REPAIR  01/07/04   Family History  Problem Relation Age of Onset  . Hypertension Sister   . Colon cancer Sister 53  . Colon polyps Sister   . Heart attack Father 39  . Esophageal cancer Neg Hx   . Rectal cancer Neg Hx   . Stomach cancer Neg Hx    Social History   Socioeconomic History  . Marital status: Married    Spouse name: Not on file  . Number of children: 3  . Years of education: Not on file  . Highest education level: Not on file  Occupational History  . 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 Financial resource strain: Not on file  . Food insecurity:    Worry: Not on file    Inability: Not on file  . Transportation needs:    Medical: Not on file    Non-medical: Not on file  Tobacco Use  . Smoking status: Former Smoker    Last attempt to quit: 11/10/1984    Years since quitting: 33.8  . Smokeless tobacco: Never Used  Substance  and Sexual Activity  . Alcohol use: Yes    Alcohol/week: 2.0 - 3.0 standard drinks    Types: 2 - 3 Glasses of wine per week  . Drug use: No  . Sexual activity: Never  Lifestyle  . Physical activity:    Days per week: Not on file    Minutes per session: Not on file  . Stress: Not on file  Relationships  . Social connections:    Talks on phone: Not on file    Gets together: Not on file    Attends religious service: Not on file    Active member of club or organization: Not on file    Attends meetings of clubs or organizations: Not on file    Relationship status: Not on file  Other Topics Concern  . Not on file  Social History Narrative   Lives with wife   Occupation: retired, was Clinical biochemist   Activity: enoys golfing, tries walking 3x/wk   Diet: good water,  fruits/vegetables daily    Outpatient Encounter Medications as of 09/16/2018  Medication Sig  . aspirin 81 MG tablet Take 81 mg by mouth daily.    Marland Kitchen atenolol (TENORMIN) 50 MG tablet TAKE 1 TABLET (50 MG TOTAL) BY MOUTH DAILY.  Marland Kitchen atorvastatin (LIPITOR) 80 MG tablet TAKE 1 TABLET (80 MG TOTAL) BY MOUTH AT BEDTIME.  . chlorthalidone (HYGROTON) 25 MG tablet TAKE 1 TABLET (25 MG TOTAL) BY MOUTH DAILY.  . cholecalciferol (VITAMIN D) 1000 UNITS tablet Take 1,000 Units by mouth daily.    . enalapril (VASOTEC) 10 MG tablet TAKE 1 TABLET (10 MG TOTAL) BY MOUTH 2 (TWO) TIMES DAILY.  Marland Kitchen glimepiride (AMARYL) 1 MG tablet Take 1 tablet (1 mg total) by mouth daily with breakfast.  . potassium chloride (K-DUR) 10 MEQ tablet Take 1 tablet (10 mEq total) by mouth daily.  . predniSONE (DELTASONE) 2.5 MG tablet Take 5 tablets (12.5 mg total) by mouth daily with breakfast.  . sildenafil (VIAGRA) 100 MG tablet Take 100 mg by mouth daily as needed.    . [DISCONTINUED] predniSONE (DELTASONE) 10 MG tablet Take 1.5 tablets (15 mg total) by mouth daily with breakfast. For 1 month   Facility-Administered Encounter Medications as of 09/16/2018  Medication  . 0.9 %  sodium chloride infusion    Activities of Daily Living In your present state of health, do you have any difficulty performing the following activities: 09/16/2018  Hearing? N  Vision? N  Difficulty concentrating or making decisions? N  Walking or climbing stairs? N  Dressing or bathing? N  Doing errands, shopping? N  Preparing Food and eating ? N  Using the Toilet? N  In the past six months, have you accidently leaked urine? N  Do you have problems with loss of bowel control? N  Managing your Medications? N  Managing your Finances? N  Housekeeping or managing your Housekeeping? N  Some recent data might be hidden    Patient Care Team: Blake Bush, MD as PCP - General Blake Peterson, Blake Peterson as Consulting Physician (Optometry) Blake Sine, MD as Consulting Physician (Dermatology)   Assessment:   This is a routine wellness examination for Blake Peterson.   Hearing Screening   125Hz  250Hz  500Hz  1000Hz  2000Hz  3000Hz  4000Hz  6000Hz  8000Hz   Right ear:   0 40 40  0    Left ear:           Comments: Hearing aid - left ear only  Vision Screening Comments: Vision exam in Nov  2019 with Dr. Maryruth Hancock B.   Exercise Activities and Dietary recommendations Current Exercise Habits: Home exercise routine, Type of exercise: walking;stretching, Time (Minutes): 30, Frequency (Times/Week): 2, Weekly Exercise (Minutes/Week): 60, Intensity: Moderate, Exercise limited by: None identified  Goals    . Increase physical activity     Starting 09/16/2018, I will continue to exercise at least 30 min 2 days per week.        Fall Risk Fall Risk  09/16/2018 09/10/2017 08/27/2016 08/27/2015 08/10/2014  Falls in the past year? 0 No No No No   Depression Screen PHQ 2/9 Scores 09/16/2018 09/10/2017 09/10/2017 08/27/2016  PHQ - 2 Score 0 0 0 0  PHQ- 9 Score 0 - - -    Cognitive Function MMSE - Mini Mental State Exam 09/16/2018 08/27/2016  Orientation to time 5 5  Orientation to Place 5 5  Registration 3 3  Attention/ Calculation 0 0  Recall 3 3  Language- name 2 objects 0 0  Language- repeat 1 1  Language- follow 3 step command 3 3  Language- read & follow direction 0 0  Write a sentence 0 0  Copy design 0 0  Total score 20 20     PLEASE NOTE: A Mini-Cog screen was completed. Maximum score is 20. A value of 0 denotes this part of Folstein MMSE was not completed or the patient failed this part of the Mini-Cog screening.   Mini-Cog Screening Orientation to Time - Max 5 pts Orientation to Place - Max 5 pts Registration - Max 3 pts Recall - Max 3 pts Language Repeat - Max 1 pts Language Follow 3 Step Command - Max 3 pts     Immunization History  Administered Date(s) Administered  . Influenza Split 06/17/2011, 06/15/2012  . Influenza Whole  07/16/2006, 07/01/2007, 06/20/2008, 06/19/2009, 06/11/2010  . Influenza,inj,Quad PF,6+ Mos 06/13/2013, 06/20/2014, 06/05/2015, 06/26/2016, 06/25/2017, 06/23/2018  . Pneumococcal Conjugate-13 08/10/2014  . Pneumococcal Polysaccharide-23 01/30/2009  . Td 06/09/2002, 08/08/2013  . Zoster 08/08/2009    Screening Tests Health Maintenance  Topic Date Due  . DTaP/Tdap/Td (1 - Tdap) 08/09/2023 (Originally 10/18/1951)  . FOOT EXAM  03/12/2019  . HEMOGLOBIN A1C  03/18/2019  . OPHTHALMOLOGY EXAM  08/18/2019  . COLONOSCOPY  02/10/2023  . TETANUS/TDAP  08/09/2023  . INFLUENZA VACCINE  Completed  . PNA vac Low Risk Adult  Completed       Peterson:     I have personally reviewed, addressed, and noted the following in the patient's chart:  A. Medical and social history B. Use of alcohol, tobacco or illicit drugs  C. Current medications and supplements D. Functional ability and status E.  Nutritional status F.  Physical activity G. Advance directives H. List of other physicians I.  Hospitalizations, surgeries, and ER visits in previous 12 months J.  [place] to include hearing, vision, cognitive, depression L. Referrals and appointments - none  In addition, I have reviewed and discussed with patient certain preventive protocols, quality metrics, and best practice recommendations. A written personalized care Peterson for preventive services as well as general preventive health recommendations were provided to patient.  See attached scanned questionnaire for additional information.   Signed,   Lindell Noe, MHA, BS, Blake Peterson Health Coach

## 2018-09-16 NOTE — Progress Notes (Signed)
PCP notes:   Health maintenance:  A1C - completed  Abnormal screenings:   Hearing - failed (right ear only)  Hearing Screening   125Hz  250Hz  500Hz  1000Hz  2000Hz  3000Hz  4000Hz  6000Hz  8000Hz   Right ear:   0 40 40  0    Left ear:           Comments: Hearing aid - left ear only  Patient concerns:   None  Nurse concerns:  None  Next PCP appt:   09/23/18 @ 1415

## 2018-09-16 NOTE — Patient Instructions (Signed)
Mr. Klingensmith , Thank you for taking time to come for your Medicare Wellness Visit. I appreciate your ongoing commitment to your health goals. Please review the following plan we discussed and let me know if I can assist you in the future.   These are the goals we discussed: Goals    . Increase physical activity     Starting 09/16/2018, I will continue to exercise at least 30 min 2 days per week.        This is a list of the screening recommended for you and due dates:  Health Maintenance  Topic Date Due  . DTaP/Tdap/Td vaccine (1 - Tdap) 08/09/2023*  . Complete foot exam   03/12/2019  . Hemoglobin A1C  03/18/2019  . Eye exam for diabetics  08/18/2019  . Colon Cancer Screening  02/10/2023  . Tetanus Vaccine  08/09/2023  . Flu Shot  Completed  . Pneumonia vaccines  Completed  *Topic was postponed. The date shown is not the original due date.   Preventive Care for Adults  A healthy lifestyle and preventive care can promote health and wellness. Preventive health guidelines for adults include the following key practices.  . A routine yearly physical is a good way to check with your health care provider about your health and preventive screening. It is a chance to share any concerns and updates on your health and to receive a thorough exam.  . Visit your dentist for a routine exam and preventive care every 6 months. Brush your teeth twice a day and floss once a day. Good oral hygiene prevents tooth decay and gum disease.  . The frequency of eye exams is based on your age, health, family medical history, use  of contact lenses, and other factors. Follow your health care provider's recommendations for frequency of eye exams.  . Eat a healthy diet. Foods like vegetables, fruits, whole grains, low-fat dairy products, and lean protein foods contain the nutrients you need without too many calories. Decrease your intake of foods high in solid fats, added sugars, and salt. Eat the right amount of  calories for you. Get information about a proper diet from your health care provider, if necessary.  . Regular physical exercise is one of the most important things you can do for your health. Most adults should get at least 150 minutes of moderate-intensity exercise (any activity that increases your heart rate and causes you to sweat) each week. In addition, most adults need muscle-strengthening exercises on 2 or more days a week.  Silver Sneakers may be a benefit available to you. To determine eligibility, you may visit the website: www.silversneakers.com or contact program at (530) 280-3751 Mon-Fri between 8AM-8PM.   . Maintain a healthy weight. The body mass index (BMI) is a screening tool to identify possible weight problems. It provides an estimate of body fat based on height and weight. Your health care provider can find your BMI and can help you achieve or maintain a healthy weight.   For adults 20 years and older: ? A BMI below 18.5 is considered underweight. ? A BMI of 18.5 to 24.9 is normal. ? A BMI of 25 to 29.9 is considered overweight. ? A BMI of 30 and above is considered obese.   . Maintain normal blood lipids and cholesterol levels by exercising and minimizing your intake of saturated fat. Eat a balanced diet with plenty of fruit and vegetables. Blood tests for lipids and cholesterol should begin at age 77 and be repeated every  5 years. If your lipid or cholesterol levels are high, you are over 50, or you are at high risk for heart disease, you may need your cholesterol levels checked more frequently. Ongoing high lipid and cholesterol levels should be treated with medicines if diet and exercise are not working.  . If you smoke, find out from your health care provider how to quit. If you do not use tobacco, please do not start.  . If you choose to drink alcohol, please do not consume more than 2 drinks per day. One drink is considered to be 12 ounces (355 mL) of beer, 5 ounces  (148 mL) of wine, or 1.5 ounces (44 mL) of liquor.  . If you are 14-3 years old, ask your health care provider if you should take aspirin to prevent strokes.  . Use sunscreen. Apply sunscreen liberally and repeatedly throughout the day. You should seek shade when your shadow is shorter than you. Protect yourself by wearing long sleeves, pants, a wide-brimmed hat, and sunglasses year round, whenever you are outdoors.  . Once a month, do a whole body skin exam, using a mirror to look at the skin on your back. Tell your health care provider of new moles, moles that have irregular borders, moles that are larger than a pencil eraser, or moles that have changed in shape or color.

## 2018-09-19 DIAGNOSIS — R972 Elevated prostate specific antigen [PSA]: Secondary | ICD-10-CM | POA: Diagnosis not present

## 2018-09-20 LAB — PSA, TOTAL AND FREE
PSA, % Free: 11 % (calc) — ABNORMAL LOW (ref >25)
PSA, Free: 0.7 ng/mL
PSA, TOTAL: 6.3 ng/mL — AB (ref <=4.0)

## 2018-09-22 ENCOUNTER — Encounter: Payer: Self-pay | Admitting: Family Medicine

## 2018-09-22 NOTE — Assessment & Plan Note (Signed)
Preventative protocols reviewed and updated unless pt declined. Discussed healthy diet and lifestyle.  

## 2018-09-22 NOTE — Progress Notes (Signed)
BP 130/74 (BP Location: Left Arm, Patient Position: Sitting, Cuff Size: Large)   Pulse 70   Temp 97.8 F (36.6 C) (Oral)   Ht 5' 8.5" (1.74 m)   Wt 222 lb 12 oz (101 kg)   SpO2 96%   BMI 33.38 kg/m    CC: CPE Subjective:    Patient ID: Blake Peterson, male    DOB: 1941/05/04, 78 y.o.   MRN: [id number]  HPI: Blake Peterson is a 78 y.o. male presenting on 09/23/2018 for Annual Exam (Pt 2. )   Undergoing slow taper of prednisone for PMR. Currently on 10mg  prednisone daily, plan to decrease by 1 mg per month. Currently taking 12.5mg  daily. Checking sugars in the mornings running 80-120.   Saw Lesia last week for medicare wellness visit. Note reviewed.    Preventative: COLONOSCOPY 01/2018 - 2 TA, mod diverticulosis, rpt 5 yrs [facility] Plan)  Prostate - always normal.Strong stream.Noticing increasing nocturia x1-2.Would like to continue screening. Uncle with prostate cancer. Flu shotyearly Tetanus shot - 2014 Pneumovax 2010, prevnar 2015 zostavax 2010 shingrix - discussed Advanced directives:discussed, has packet at home and he will work on this. Doesn't want prolonged life support if terminal condition.Would want wife to be HCPOA. Will bring me copy Seat belt use discussed Sunscreen use discussed. No changing moles on skin. Sees derm.  Ex smoker (remotely)  Alcohol - 1 glass wine 4oz or less daily  Dentist - due for this  Eye exam yearly   Lives with wife  Occupation: retired, was Clinical biochemist Activity: enoys golfing,mows lawn,tries walking 3x/wk Diet: good water, fruits/vegetables daily     Relevant past medical, surgical, family and social history reviewed and updated as indicated. Interim medical history since our last visit reviewed. Allergies and medications reviewed and updated. Outpatient Medications Prior to Visit  Medication Sig Dispense Refill  . aspirin 81 MG tablet Take 81 mg by mouth daily.      Marland Kitchen atenolol (TENORMIN) 50 MG tablet TAKE 1 TABLET (50 MG  TOTAL) BY MOUTH DAILY. 90 tablet 3  . atorvastatin (LIPITOR) 80 MG tablet TAKE 1 TABLET (80 MG TOTAL) BY MOUTH AT BEDTIME. 90 tablet 1  . chlorthalidone (HYGROTON) 25 MG tablet TAKE 1 TABLET (25 MG TOTAL) BY MOUTH DAILY. 90 tablet 0  . cholecalciferol (VITAMIN D) 1000 UNITS tablet Take 1,000 Units by mouth daily.      . enalapril (VASOTEC) 10 MG tablet TAKE 1 TABLET (10 MG TOTAL) BY MOUTH 2 (TWO) TIMES DAILY. 180 tablet 1  . glimepiride (AMARYL) 1 MG tablet Take 1 tablet (1 mg total) by mouth daily with breakfast. 90 tablet 1  . potassium chloride (K-DUR) 10 MEQ tablet Take 1 tablet (10 mEq total) by mouth daily. 90 tablet 1  . sildenafil (VIAGRA) 100 MG tablet Take 100 mg by mouth daily as needed.      . predniSONE (DELTASONE) 2.5 MG tablet Take 5 tablets (12.5 mg total) by mouth daily with breakfast. 150 tablet 1  . 0.9 %  sodium chloride infusion      No facility-administered medications prior to visit.      Per HPI unless specifically indicated in ROS section below Review of Systems  Constitutional: Positive for appetite change and unexpected weight change (weight gain noted). Negative for activity change, chills, fatigue and fever.  HENT: Negative for hearing loss.   Eyes: Negative for visual disturbance.  Respiratory: Negative for cough, chest tightness, shortness of breath and wheezing.   Cardiovascular: Negative for chest  pain, palpitations and leg swelling.  Gastrointestinal: Negative for abdominal distention, abdominal pain, blood in stool, constipation, diarrhea, nausea and vomiting.  Genitourinary: Negative for difficulty urinating and hematuria.  Musculoskeletal: Negative for arthralgias, myalgias and neck pain.  Skin: Negative for rash.  Neurological: Negative for dizziness, seizures, syncope and headaches.  Hematological: Negative for adenopathy. Does not bruise/bleed easily.  Psychiatric/Behavioral: Negative for dysphoric mood. The patient is not nervous/anxious.     Objective:    BP 130/74 (BP Location: Left Arm, Patient Position: Sitting, Cuff Size: Large)   Pulse 70   Temp 97.8 F (36.6 C) (Oral)   Ht 5' 8.5" (1.74 m)   Wt 222 lb 12 oz (101 kg)   SpO2 96%   BMI 33.38 kg/m   Wt Readings from Last 3 Encounters:  09/23/18 222 lb 12 oz (101 kg)  09/16/18 224 lb (101.6 kg)  07/18/18 210 lb 4 oz (95.4 kg)    Physical Exam Vitals signs and nursing note reviewed.  Constitutional:      General: He is not in acute distress.    Appearance: He is well-developed.  HENT:     Head: Normocephalic and atraumatic.     Right Ear: Hearing, tympanic membrane, ear canal and external ear normal.     Left Ear: Hearing, tympanic membrane, ear canal and external ear normal.     Nose: Nose normal.     Mouth/Throat:     Pharynx: Uvula midline. No oropharyngeal exudate or posterior oropharyngeal erythema.  Eyes:     General: No scleral icterus.    Conjunctiva/sclera: Conjunctivae normal.     Pupils: Pupils are equal, round, and reactive to light.  Neck:     Musculoskeletal: Normal range of motion and neck supple.     Vascular: No carotid bruit (difficult exam).  Cardiovascular:     Rate and Rhythm: Normal rate and regular rhythm.     Pulses:          Radial pulses are 2+ on the right side and 2+ on the left side.     Heart sounds: Normal heart sounds. No murmur.  Pulmonary:     Effort: Pulmonary effort is normal. No respiratory distress.     Breath sounds: Normal breath sounds. No wheezing or rales.  Abdominal:     General: Bowel sounds are normal. There is no distension.     Palpations: Abdomen is soft. There is no mass.     Tenderness: There is no abdominal tenderness. There is no guarding or rebound.     Hernia: No hernia is present.     Comments: Firm abdomen  Genitourinary:    Prostate: Normal. Not enlarged (20gm), not tender and no nodules present.     Rectum: Normal. No mass, tenderness, anal fissure, external hemorrhoid or internal  hemorrhoid. Normal anal tone.     Comments: No nodularity or induration appreciated Musculoskeletal: Normal range of motion.  Lymphadenopathy:     Cervical: No cervical adenopathy.  Skin:    General: Skin is warm and dry.     Findings: No rash.  Neurological:     Mental Status: He is alert and oriented to person, place, and time.     Comments: CN grossly intact, station and gait intact  Psychiatric:        Behavior: Behavior normal.        Thought Content: Thought content normal.        Judgment: Judgment normal.       Results for  orders placed or performed in visit on 09/16/18  Microalbumin / creatinine urine ratio  Result Value Ref Range   Microalb, Ur 0.8 0.0 - 1.9 mg/dL   Creatinine,U 93.9 mg/dL   Microalb Creat Ratio 0.8 0.0 - 30.0 mg/g  PSA  Result Value Ref Range   PSA 6.72 (H) 0.10 - 4.00 ng/mL  VITAMIN D 25 Hydroxy (Vit-D Deficiency, Fractures)  Result Value Ref Range   VITD 43.01 30.00 - 100.00 ng/mL  Hemoglobin A1c  Result Value Ref Range   Hgb A1c MFr Bld 6.8 (H) 4.6 - 6.5 %  Comprehensive metabolic panel  Result Value Ref Range   Sodium 135 135 - 145 mEq/L   Potassium 3.7 3.5 - 5.1 mEq/L   Chloride 93 (L) 96 - 112 mEq/L   CO2 32 19 - 32 mEq/L   Glucose, Bld 82 70 - 99 mg/dL   BUN 20 6 - 23 mg/dL   Creatinine, Ser 0.85 0.40 - 1.50 mg/dL   Total Bilirubin 1.1 0.2 - 1.2 mg/dL   Alkaline Phosphatase 55 39 - 117 U/L   AST 21 0 - 37 U/L   ALT 32 0 - 53 U/L   Total Protein 6.7 6.0 - 8.3 g/dL   Albumin 4.0 3.5 - 5.2 g/dL   Calcium 10.2 8.4 - 10.5 mg/dL   GFR 93.17 >60.00 mL/min  Lipid panel  Result Value Ref Range   Cholesterol 162 0 - 200 mg/dL   Triglycerides 156.0 (H) 0.0 - 149.0 mg/dL   HDL 61.80 >39.00 mg/dL   VLDL 31.2 0.0 - 40.0 mg/dL   LDL Cholesterol 69 0 - 99 mg/dL   Total CHOL/HDL Ratio 3    NonHDL 100.04   PSA, total and free  Result Value Ref Range   PSA, Total 6.3 (H) < OR = 4.0 ng/mL   PSA, Free 0.7 ng/mL   PSA, % Free 11 (L) >25 %  (calc)   Assessment & Plan:   Problem List Items Addressed This Visit    Vitamin D deficiency    Levels well controlled on vit D 1000 iu daily.       Thoracic aorta atherosclerosis (HCC)    Continue aspirin, statin.      Polymyalgia rheumatica (HCC)    Stable on current 12.5mg  prednisone dose. Weight gain noted however. Will continue taper down to 10mg  daily for next month, then decrease by 1 mg/month until off. RTC 6 wks f/u visit.       Obesity, Class I, BMI 30-34.9    Weight gain noted. Encouraged healthy diet and lifestyle changes to affect sustainable weight loss. Pt planning to resume regular activity. Obesity contributes to chronic medical conditions.       HTN (hypertension)    Chrponic, stable. Continue current regimen.       HLD (hyperlipidemia)    Chronic, stable on statin. Continue. The 10-year ASCVD risk score Mikey Bussing DC Brooke Bonito., et al., 2013) is: 43.5%   Values used to calculate the score:     Age: 80 years     Sex: Male     Is Non-Hispanic African American: No     Diabetic: Yes     Tobacco smoker: No     Systolic Blood Pressure: 235 mmHg     Is BP treated: Yes     HDL Cholesterol: 61.8 mg/dL     Total Cholesterol: 162 mg/dL       Health maintenance examination - Primary    Preventative protocols reviewed and  updated unless pt declined. Discussed healthy diet and lifestyle.       Elevated PSA    Elevation noted. Pt asxs, DRE unremarkable. Will recheck free/total PSA in 6 wks, if remaining elevated will refer to urology. Pt agrees with plan.       Relevant Orders   PSA, Total with Reflex to PSA, Free   Controlled type 2 diabetes mellitus without complication, without long-term current use of insulin (HCC)    A1c remains controlled with glimepiride 1mg  daily with breakfast while on prednisone.       Advanced care planning/counseling discussion    Advanced directives:discussed, has packet at home and he will work on this. Doesn't want prolonged life  support if terminal condition.Would want wife to be HCPOA. Will bring me copy          Meds ordered this encounter  Medications  . DISCONTD: predniSONE (DELTASONE) 2.5 MG tablet    Sig: Take 4 tablets (10 mg total) by mouth daily with breakfast. For 1 month    Dispense:  120 tablet    Refill:  0  . DISCONTD: predniSONE (DELTASONE) 5 MG tablet    Sig: Take 1 tablet (5 mg total) by mouth daily with breakfast. With 4mg  daily for total 9mg     Dispense:  30 tablet    Refill:  0  . DISCONTD: predniSONE (DELTASONE) 1 MG tablet    Sig: Take 4 tablets (4 mg total) by mouth daily with breakfast. With 5mg  for total 9mg     Dispense:  120 tablet    Refill:  0  . predniSONE (DELTASONE) 1 MG tablet    Sig: Take 4 tablets (4 mg total) by mouth daily with breakfast. With 5mg  for total 9mg     Dispense:  120 tablet    Refill:  0  . predniSONE (DELTASONE) 5 MG tablet    Sig: Take 1 tablet (5 mg total) by mouth daily with breakfast. With 4mg  daily for total 9mg     Dispense:  30 tablet    Refill:  0  . predniSONE (DELTASONE) 2.5 MG tablet    Sig: Take 4 tablets (10 mg total) by mouth daily with breakfast. For 1 month    Dispense:  120 tablet    Refill:  0    Correction - use only this dose for 09/2018 (10mg  total daily)   Orders Placed This Encounter  Procedures  . PSA, Total with Reflex to PSA, Free    Standing Status:   Future    Standing Expiration Date:   09/24/2019    Follow up plan: Return in about 6 weeks (around 11/04/2018) for follow up visit.  Ria Bush, MD

## 2018-09-23 ENCOUNTER — Encounter: Payer: Self-pay | Admitting: Family Medicine

## 2018-09-23 ENCOUNTER — Ambulatory Visit (INDEPENDENT_AMBULATORY_CARE_PROVIDER_SITE_OTHER): Payer: PPO | Admitting: Family Medicine

## 2018-09-23 VITALS — BP 130/74 | HR 70 | Temp 97.8°F | Ht 68.5 in | Wt 222.8 lb

## 2018-09-23 DIAGNOSIS — E785 Hyperlipidemia, unspecified: Secondary | ICD-10-CM

## 2018-09-23 DIAGNOSIS — Z0000 Encounter for general adult medical examination without abnormal findings: Secondary | ICD-10-CM | POA: Diagnosis not present

## 2018-09-23 DIAGNOSIS — R972 Elevated prostate specific antigen [PSA]: Secondary | ICD-10-CM | POA: Diagnosis not present

## 2018-09-23 DIAGNOSIS — E669 Obesity, unspecified: Secondary | ICD-10-CM

## 2018-09-23 DIAGNOSIS — E559 Vitamin D deficiency, unspecified: Secondary | ICD-10-CM

## 2018-09-23 DIAGNOSIS — Z7189 Other specified counseling: Secondary | ICD-10-CM

## 2018-09-23 DIAGNOSIS — I700 Atherosclerosis of aorta: Secondary | ICD-10-CM

## 2018-09-23 DIAGNOSIS — I10 Essential (primary) hypertension: Secondary | ICD-10-CM

## 2018-09-23 DIAGNOSIS — E119 Type 2 diabetes mellitus without complications: Secondary | ICD-10-CM | POA: Diagnosis not present

## 2018-09-23 DIAGNOSIS — M353 Polymyalgia rheumatica: Secondary | ICD-10-CM | POA: Diagnosis not present

## 2018-09-23 MED ORDER — PREDNISONE 1 MG PO TABS: 4.0000 mg | ORAL_TABLET | Freq: Every day | ORAL | 0 refills | Status: DC

## 2018-09-23 MED ORDER — PREDNISONE 2.5 MG PO TABS: 10.0000 mg | ORAL_TABLET | Freq: Every day | ORAL | 0 refills | Status: DC

## 2018-09-23 MED ORDER — PREDNISONE 5 MG PO TABS: 5.0000 mg | ORAL_TABLET | Freq: Every day | ORAL | 0 refills | Status: DC

## 2018-09-23 NOTE — Assessment & Plan Note (Addendum)
Continue aspirin, statin.  

## 2018-09-23 NOTE — Assessment & Plan Note (Signed)
Chrponic, stable. Continue current regimen.

## 2018-09-23 NOTE — Assessment & Plan Note (Signed)
Advanced directives:discussed, has packet at home and he will work on this. Doesn't want prolonged life support if terminal condition.Would want wife to be HCPOA. Will bring me copy

## 2018-09-23 NOTE — Assessment & Plan Note (Signed)
A1c remains controlled with glimepiride 1mg  daily with breakfast while on prednisone.

## 2018-09-23 NOTE — Assessment & Plan Note (Addendum)
Weight gain noted. Encouraged healthy diet and lifestyle changes to affect sustainable weight loss. Pt planning to resume regular activity. Obesity contributes to chronic medical conditions.

## 2018-09-23 NOTE — Assessment & Plan Note (Signed)
Elevation noted. Pt asxs, DRE unremarkable. Will recheck free/total PSA in 6 wks, if remaining elevated will refer to urology. Pt agrees with plan.

## 2018-09-23 NOTE — Assessment & Plan Note (Signed)
Levels well controlled on vit D 1000 iu daily.

## 2018-09-23 NOTE — Patient Instructions (Addendum)
New dose for prednisone will be 10mg  daily for 1 month then decrease dose to 9mg  daily.  Keep 11/07/2018 appointment for follow up.  If interested, check with pharmacy about new 2 shot shingles series (shingrix).  PSA was elevated today - return in 6 weeks for repeat labs and if persistently high, we will refer you to urologist.   Health Maintenance After Age 76 After age 70, you are at a higher risk for certain long-term diseases and infections as well as injuries from falls. Falls are a major cause of broken bones and head injuries in people who are older than age 90. Getting regular preventive care can help to keep you healthy and well. Preventive care includes getting regular testing and making lifestyle changes as recommended by your health care provider. Talk with your health care provider about:  Which screenings and tests you should have. A screening is a test that checks for a disease when you have no symptoms.  A diet and exercise plan that is right for you. What should I know about screenings and tests to prevent falls? Screening and testing are the best ways to find a health problem early. Early diagnosis and treatment give you the best chance of managing medical conditions that are common after age 29. Certain conditions and lifestyle choices may make you more likely to have a fall. Your health care provider may recommend:  Regular vision checks. Poor vision and conditions such as cataracts can make you more likely to have a fall. If you wear glasses, make sure to get your prescription updated if your vision changes.  Medicine review. Work with your health care provider to regularly review all of the medicines you are taking, including over-the-counter medicines. Ask your health care provider about any side effects that may make you more likely to have a fall. Tell your health care provider if any medicines that you take make you feel dizzy or sleepy.  Osteoporosis screening.  Osteoporosis is a condition that causes the bones to get weaker. This can make the bones weak and cause them to break more easily.  Blood pressure screening. Blood pressure changes and medicines to control blood pressure can make you feel dizzy.  Strength and balance checks. Your health care provider may recommend certain tests to check your strength and balance while standing, walking, or changing positions.  Foot health exam. Foot pain and numbness, as well as not wearing proper footwear, can make you more likely to have a fall.  Depression screening. You may be more likely to have a fall if you have a fear of falling, feel emotionally low, or feel unable to do activities that you used to do.  Alcohol use screening. Using too much alcohol can affect your balance and may make you more likely to have a fall. What actions can I take to lower my risk of falls? General instructions  Talk with your health care provider about your risks for falling. Tell your health care provider if: ? You fall. Be sure to tell your health care provider about all falls, even ones that seem minor. ? You feel dizzy, sleepy, or off-balance.  Take over-the-counter and prescription medicines only as told by your health care provider. These include any supplements.  Eat a healthy diet and maintain a healthy weight. A healthy diet includes low-fat dairy products, low-fat (lean) meats, and fiber from whole grains, beans, and lots of fruits and vegetables. Home safety  Remove any tripping hazards, such as rugs,  cords, and clutter.  Install safety equipment such as grab bars in bathrooms and safety rails on stairs.  Keep rooms and walkways well-lit. Activity   Follow a regular exercise program to stay fit. This will help you maintain your balance. Ask your health care provider what types of exercise are appropriate for you.  If you need a cane or walker, use it as recommended by your health care provider.  Wear  supportive shoes that have nonskid soles. Lifestyle  Do not drink alcohol if your health care provider tells you not to drink.  If you drink alcohol, limit how much you have: ? 0-1 drink a day for women. ? 0-2 drinks a day for men.  Be aware of how much alcohol is in your drink. In the [place] one drink equals one typical bottle of beer (12 oz), one-half glass of wine (5 oz), or one shot of hard liquor (1 oz).  Do not use any products that contain nicotine or tobacco, such as cigarettes and e-cigarettes. If you need help quitting, ask your health care provider. Summary  Having a healthy lifestyle and getting preventive care can help to protect your health and wellness after age 8.  Screening and testing are the best way to find a health problem early and help you avoid having a fall. Early diagnosis and treatment give you the best chance for managing medical conditions that are more common for people who are older than age 23.  Falls are a major cause of broken bones and head injuries in people who are older than age 71. Take precautions to prevent a fall at home.  Work with your health care provider to learn what changes you can make to improve your health and wellness and to prevent falls. This information is not intended to replace advice given to you by your health care provider. Make sure you discuss any questions you have with your health care provider. Document Released: 07/21/2017 Document Revised: 07/21/2017 Document Reviewed: 07/21/2017 Elsevier Interactive Patient Education  2019 [employer].

## 2018-09-23 NOTE — Assessment & Plan Note (Signed)
Chronic, stable on statin. Continue. The 10-year ASCVD risk score Blake Peterson Blake Peterson., et al., 2013) is: 43.5%   Values used to calculate the score:     Age: 78 years     Sex: Male     Is Non-Hispanic African American: No     Diabetic: Yes     Tobacco smoker: No     Systolic Blood Pressure: 176 mmHg     Is BP treated: Yes     HDL Cholesterol: 61.8 mg/dL     Total Cholesterol: 162 mg/dL

## 2018-09-23 NOTE — Assessment & Plan Note (Addendum)
Stable on current 12.5mg  prednisone dose. Weight gain noted however. Will continue taper down to 10mg  daily for next month, then decrease by 1 mg/month until off. RTC 6 wks f/u visit.

## 2018-10-07 ENCOUNTER — Telehealth: Payer: Self-pay | Admitting: Family Medicine

## 2018-10-07 NOTE — Telephone Encounter (Signed)
Left message asking pt to call office please r/s 2/17 appointment with dr g

## 2018-10-08 ENCOUNTER — Other Ambulatory Visit: Payer: Self-pay | Admitting: Family Medicine

## 2018-10-13 ENCOUNTER — Other Ambulatory Visit: Payer: Self-pay | Admitting: Family Medicine

## 2018-10-16 ENCOUNTER — Other Ambulatory Visit: Payer: Self-pay | Admitting: Family Medicine

## 2018-10-18 ENCOUNTER — Other Ambulatory Visit: Payer: Self-pay | Admitting: Family Medicine

## 2018-10-18 NOTE — Telephone Encounter (Signed)
Prednisone Last filled:  09/23/18, #30/0 Last OV:  09/23/18, CPE Next OV:  11/14/18, 3-4 mo f/u.

## 2018-10-19 NOTE — Telephone Encounter (Signed)
plz call - receiving early refill of prednisone 5mg .  He should have been taking 10mg  prednisone (four 2.5mg  tablets) daily for Jan and start taking 9mg  prednisone 10/22/2018 (four 1mg  + one 5mg ).  How has he been taking prednisone??

## 2018-10-20 DIAGNOSIS — L820 Inflamed seborrheic keratosis: Secondary | ICD-10-CM | POA: Diagnosis not present

## 2018-10-20 DIAGNOSIS — L814 Other melanin hyperpigmentation: Secondary | ICD-10-CM | POA: Diagnosis not present

## 2018-10-20 DIAGNOSIS — L821 Other seborrheic keratosis: Secondary | ICD-10-CM | POA: Diagnosis not present

## 2018-10-20 DIAGNOSIS — L565 Disseminated superficial actinic porokeratosis (DSAP): Secondary | ICD-10-CM | POA: Diagnosis not present

## 2018-10-20 DIAGNOSIS — L570 Actinic keratosis: Secondary | ICD-10-CM | POA: Diagnosis not present

## 2018-10-20 NOTE — Telephone Encounter (Signed)
Spoke with pt's wife, Blake Peterson (on dpr) relaying Dr. Synthia Innocent message. She confirms pt has been taking med as instructed. Says she is not sure why the pharmacy sent refill request. They will have 1 mg tab filled so pt can start 10/22/18 dosing on Sat.

## 2018-10-22 ENCOUNTER — Other Ambulatory Visit: Payer: Self-pay | Admitting: Family Medicine

## 2018-10-22 NOTE — Telephone Encounter (Signed)
Thanks. Refill denied.

## 2018-11-07 ENCOUNTER — Ambulatory Visit: Payer: PPO | Admitting: Family Medicine

## 2018-11-09 NOTE — Progress Notes (Signed)
I reviewed health advisor's note, was available for consultation, and agree with documentation and plan.  

## 2018-11-11 ENCOUNTER — Other Ambulatory Visit: Payer: Self-pay | Admitting: Family Medicine

## 2018-11-11 NOTE — Telephone Encounter (Signed)
Prednisone Last filled:  10/19/18 Last OV:  09/23/18, CPE Next OV:  11/14/18, PMR f/u

## 2018-11-11 NOTE — Telephone Encounter (Signed)
Current dose is 9mg  daily for the rest of the month.  Denied. Pt should have enough to last through Monday appt

## 2018-11-14 ENCOUNTER — Encounter: Payer: Self-pay | Admitting: Family Medicine

## 2018-11-14 ENCOUNTER — Ambulatory Visit (INDEPENDENT_AMBULATORY_CARE_PROVIDER_SITE_OTHER): Payer: PPO | Admitting: Family Medicine

## 2018-11-14 VITALS — BP 126/66 | HR 59 | Temp 97.5°F | Ht 68.5 in | Wt 231.2 lb

## 2018-11-14 DIAGNOSIS — M353 Polymyalgia rheumatica: Secondary | ICD-10-CM | POA: Diagnosis not present

## 2018-11-14 DIAGNOSIS — R972 Elevated prostate specific antigen [PSA]: Secondary | ICD-10-CM | POA: Diagnosis not present

## 2018-11-14 DIAGNOSIS — R600 Localized edema: Secondary | ICD-10-CM

## 2018-11-14 LAB — BASIC METABOLIC PANEL
BUN: 18 mg/dL (ref 6–23)
CO2: 34 mEq/L — ABNORMAL HIGH (ref 19–32)
Calcium: 10.4 mg/dL (ref 8.4–10.5)
Chloride: 93 mEq/L — ABNORMAL LOW (ref 96–112)
Creatinine, Ser: 0.92 mg/dL (ref 0.40–1.50)
GFR: 79.97 mL/min (ref >60.00)
Glucose, Bld: 133 mg/dL — ABNORMAL HIGH (ref 70–99)
Potassium: 3.5 mEq/L (ref 3.5–5.1)
Sodium: 134 mEq/L — ABNORMAL LOW (ref 135–145)

## 2018-11-14 MED ORDER — PREDNISONE 5 MG PO TABS: 5.0000 mg | ORAL_TABLET | Freq: Every day | ORAL | 0 refills | Status: DC

## 2018-11-14 MED ORDER — PREDNISONE 1 MG PO TABS: ORAL_TABLET | ORAL | 0 refills | Status: DC

## 2018-11-14 NOTE — Assessment & Plan Note (Addendum)
Deterioration noted today - ?prednisone effect.  Denies CHF symptoms.

## 2018-11-14 NOTE — Patient Instructions (Addendum)
Let's speed up prednisone taper - decrease to 8mg  for 2 weeks then 7mg  for 2 weeks. Labs today. Return in 1 month for follow up visit.

## 2018-11-14 NOTE — Progress Notes (Signed)
BP 126/66   Pulse (!) 59   Temp (!) 97.5 F (36.4 C)   Ht 5' 8.5" (1.74 m)   Wt 231 lb 4 oz (104.9 kg)   SpO2 96%   BMI 34.65 kg/m    CC: PMR f/u visit Subjective:    Patient ID: Blake Peterson, male    DOB: [birth year], 78 y.o.   MRN: [id number]  HPI: Blake Peterson is a 78 y.o. male presenting on 11/14/2018 for polymyalgia rheumatica (6 week follow up)   See prior note for details. Undergoing slow prednisone taper for PMR. Currently on 9mg  daily, planning to decrease by 1 mg/month. Feels "swollen" - notes some swelling of hands and feet. Some fatigue, feels tired.no soreness noted. Intermittent weakening of grip. Weight gain noted. Denies headaches, vision changes. Leg swelling noted. No dyspnea or cough. Continues walking 2-3 times a week (30 min at a time).   Elevated PSA noted last month, in setting of reassuring DRE and no symptoms of prostatic enlargement. Planning free PSA today.   DM - continues glimepiride 1mg  while on prednisone.      Relevant past medical, surgical, family and social history reviewed and updated as indicated. Interim medical history since our last visit reviewed. Allergies and medications reviewed and updated. Outpatient Medications Prior to Visit  Medication Sig Dispense Refill  . aspirin 81 MG tablet Take 81 mg by mouth daily.      Marland Kitchen atenolol (TENORMIN) 50 MG tablet TAKE 1 TABLET (50 MG TOTAL) BY MOUTH DAILY. 90 tablet 3  . atorvastatin (LIPITOR) 80 MG tablet TAKE 1 TABLET (80 MG TOTAL) BY MOUTH AT BEDTIME. 90 tablet 3  . chlorthalidone (HYGROTON) 25 MG tablet TAKE 1 TABLET (25 MG TOTAL) BY MOUTH DAILY. 90 tablet 0  . cholecalciferol (VITAMIN D) 1000 UNITS tablet Take 1,000 Units by mouth daily.      . enalapril (VASOTEC) 10 MG tablet TAKE 1 TABLET (10 MG TOTAL) BY MOUTH 2 (TWO) TIMES DAILY. 180 tablet 0  . glimepiride (AMARYL) 1 MG tablet Take 1 tablet (1 mg total) by mouth daily with breakfast. 90 tablet 1  . potassium chloride (K-DUR) 10 MEQ  tablet Take 1 tablet (10 mEq total) by mouth daily. 90 tablet 1  . sildenafil (VIAGRA) 100 MG tablet Take 100 mg by mouth daily as needed.      . predniSONE (DELTASONE) 1 MG tablet Take 4 tablets (4 mg total) by mouth daily with breakfast. With 5mg  for total 9mg  120 tablet 0  . predniSONE (DELTASONE) 5 MG tablet Take 1 tablet (5 mg total) by mouth daily with breakfast. With 4mg  daily for total 9mg  30 tablet 0   No facility-administered medications prior to visit.      Per HPI unless specifically indicated in ROS section below Review of Systems Objective:    BP 126/66   Pulse (!) 59   Temp (!) 97.5 F (36.4 C)   Ht 5' 8.5" (1.74 m)   Wt 231 lb 4 oz (104.9 kg)   SpO2 96%   BMI 34.65 kg/m   Wt Readings from Last 3 Encounters:  11/14/18 231 lb 4 oz (104.9 kg)  09/23/18 222 lb 12 oz (101 kg)  09/16/18 224 lb (101.6 kg)    Physical Exam Vitals signs and nursing note reviewed.  Constitutional:      Appearance: Normal appearance. He is not ill-appearing.  HENT:     Head: Normocephalic and atraumatic.     Mouth/Throat:  Mouth: Mucous membranes are moist.  Eyes:     Extraocular Movements: Extraocular movements intact.     Conjunctiva/sclera: Conjunctivae normal.     Pupils: Pupils are equal, round, and reactive to light.  Cardiovascular:     Rate and Rhythm: Normal rate and regular rhythm.     Pulses: Normal pulses.     Heart sounds: Normal heart sounds. No murmur.  Pulmonary:     Effort: Pulmonary effort is normal. No respiratory distress.     Breath sounds: Normal breath sounds. No wheezing, rhonchi or rales.  Musculoskeletal:     Right lower leg: Edema (1+) present.     Left lower leg: Edema (1+) present.  Neurological:     Mental Status: He is alert.  Psychiatric:        Mood and Affect: Mood normal.        Behavior: Behavior normal.       Results for orders placed or performed in visit on 09/16/18  Microalbumin / creatinine urine ratio  Result Value Ref Range     Microalb, Ur 0.8 0.0 - 1.9 mg/dL   Creatinine,U 93.9 mg/dL   Microalb Creat Ratio 0.8 0.0 - 30.0 mg/g  PSA  Result Value Ref Range   PSA 6.72 (H) 0.10 - 4.00 ng/mL  VITAMIN D 25 Hydroxy (Vit-D Deficiency, Fractures)  Result Value Ref Range   VITD 43.01 30.00 - 100.00 ng/mL  Hemoglobin A1c  Result Value Ref Range   Hgb A1c MFr Bld 6.8 (H) 4.6 - 6.5 %  Comprehensive metabolic panel  Result Value Ref Range   Sodium 135 135 - 145 mEq/L   Potassium 3.7 3.5 - 5.1 mEq/L   Chloride 93 (L) 96 - 112 mEq/L   CO2 32 19 - 32 mEq/L   Glucose, Bld 82 70 - 99 mg/dL   BUN 20 6 - 23 mg/dL   Creatinine, Ser 0.85 0.40 - 1.50 mg/dL   Total Bilirubin 1.1 0.2 - 1.2 mg/dL   Alkaline Phosphatase 55 39 - 117 U/L   AST 21 0 - 37 U/L   ALT 32 0 - 53 U/L   Total Protein 6.7 6.0 - 8.3 g/dL   Albumin 4.0 3.5 - 5.2 g/dL   Calcium 10.2 8.4 - 10.5 mg/dL   GFR 93.17 >60.00 mL/min  Lipid panel  Result Value Ref Range   Cholesterol 162 0 - 200 mg/dL   Triglycerides 156.0 (H) 0.0 - 149.0 mg/dL   HDL 61.80 >39.00 mg/dL   VLDL 31.2 0.0 - 40.0 mg/dL   LDL Cholesterol 69 0 - 99 mg/dL   Total CHOL/HDL Ratio 3    NonHDL 100.04   PSA, total and free  Result Value Ref Range   PSA, Total 6.3 (H) < OR = 4.0 ng/mL   PSA, Free 0.7 ng/mL   PSA, % Free 11 (L) >25 % (calc)   Assessment & Plan:   Problem List Items Addressed This Visit    Polymyalgia rheumatica ([place]) - Primary    Overall doing well, but noted pedal edema and general edema, as well as endorsed fatigue which may be prednisone related. Will try to speed prednisone taper q2wks. Pt agrees with plan. RTC 1 mo f/u visit.       Relevant Orders   Basic metabolic panel   Pedal edema    Deterioration noted today - ?prednisone effect.  Denies CHF symptoms.       Elevated PSA    Update labs. If persistently elevated, low  threshold to refer to urology. pt agrees.       Relevant Orders   PSA, Total with Reflex to PSA, Free       Meds ordered this  encounter  Medications  . predniSONE (DELTASONE) 1 MG tablet    Sig: Take 3 tablets (3 mg total) by mouth daily with breakfast for 14 days, THEN 2 tablets (2 mg total) daily with breakfast for 14 days. (with 5mg  dose).    Dispense:  70 tablet    Refill:  0  . predniSONE (DELTASONE) 5 MG tablet    Sig: Take 1 tablet (5 mg total) by mouth daily with breakfast. With 4mg  daily for total 9mg     Dispense:  30 tablet    Refill:  0   Orders Placed This Encounter  Procedures  . Basic metabolic panel  . PSA, Total with Reflex to PSA, Free    Follow up plan: Return in about 4 weeks (around 12/12/2018), or if symptoms worsen or fail to improve, for follow up visit.  Ria Bush, MD

## 2018-11-14 NOTE — Assessment & Plan Note (Signed)
Overall doing well, but noted pedal edema and general edema, as well as endorsed fatigue which may be prednisone related. Will try to speed prednisone taper q2wks. Pt agrees with plan. RTC 1 mo f/u visit.

## 2018-11-14 NOTE — Assessment & Plan Note (Signed)
Update labs. If persistently elevated, low threshold to refer to urology. pt agrees.

## 2018-11-15 LAB — PSA, TOTAL WITH REFLEX TO PSA, FREE: PSA, Total: 4.9 ng/mL — ABNORMAL HIGH (ref <=4.0)

## 2018-11-15 LAB — REFLEX PSA, FREE
PSA, % Free: 12 % (calc) — ABNORMAL LOW (ref >25)
PSA, Free: 0.6 ng/mL

## 2018-11-19 ENCOUNTER — Other Ambulatory Visit: Payer: Self-pay | Admitting: Family Medicine

## 2018-12-04 ENCOUNTER — Other Ambulatory Visit: Payer: Self-pay | Admitting: Family Medicine

## 2018-12-05 ENCOUNTER — Other Ambulatory Visit: Payer: Self-pay | Admitting: Family Medicine

## 2018-12-06 NOTE — Telephone Encounter (Signed)
Last office visit 11/14/2018 for polymyalgia.  Last refilled 11/14/2018 for #70 with no refills.  Next appt: 12/13/2018 for 1 month follow up.

## 2018-12-07 MED ORDER — PREDNISONE 5 MG PO TABS: 5.0000 mg | ORAL_TABLET | Freq: Every day | ORAL | 0 refills | Status: DC

## 2018-12-07 NOTE — Telephone Encounter (Signed)
Refilled. Will review with pt at upcoming appt

## 2018-12-13 ENCOUNTER — Ambulatory Visit (INDEPENDENT_AMBULATORY_CARE_PROVIDER_SITE_OTHER): Payer: PPO | Admitting: Family Medicine

## 2018-12-13 ENCOUNTER — Other Ambulatory Visit: Payer: Self-pay

## 2018-12-13 DIAGNOSIS — R600 Localized edema: Secondary | ICD-10-CM

## 2018-12-13 DIAGNOSIS — M353 Polymyalgia rheumatica: Secondary | ICD-10-CM | POA: Diagnosis not present

## 2018-12-13 DIAGNOSIS — R972 Elevated prostate specific antigen [PSA]: Secondary | ICD-10-CM

## 2018-12-13 DIAGNOSIS — N400 Enlarged prostate without lower urinary tract symptoms: Secondary | ICD-10-CM | POA: Diagnosis not present

## 2018-12-13 DIAGNOSIS — E119 Type 2 diabetes mellitus without complications: Secondary | ICD-10-CM | POA: Diagnosis not present

## 2018-12-13 MED ORDER — PREDNISONE 1 MG PO TABS: ORAL_TABLET | ORAL | 0 refills | Status: DC

## 2018-12-13 NOTE — Assessment & Plan Note (Signed)
Continues glimepiride 1mg  while on prednisone. Fasting cbg yesterday 120s.

## 2018-12-13 NOTE — Assessment & Plan Note (Addendum)
Notes persistent hand and leg swelling associated with some muscle weakness since prednisone started - concern for prednisone side effect but difficulty speeding taper due to return of PMR symptoms. Already on chlorthalidone regularly. Will refer to rheum for PMR assistance - may cancel if doing better with taper.

## 2018-12-13 NOTE — Assessment & Plan Note (Addendum)
Increasing shoulder stiffness noted with taper to 8mg  then 7mg  - will return to 9mg  dose - last dose he felt well on. Reassess at 3 wk f/u - appt scheduled for 4/15 10am. See below for pedal edema/weakness plan.

## 2018-12-13 NOTE — Assessment & Plan Note (Addendum)
Again reviewed recent readings. Stable PSA 3-4 range over last 8 yrs. PSA recently peaked to 6.7, returned to 4.9 on repeat. Discussed anticipate BPH less likely small low grade cancer, offered urology eval but reasonable to more routinely monitor PSAs. He opts to recheck PSA later this year.

## 2018-12-13 NOTE — Progress Notes (Addendum)
   [name] - 78 y.o. male  MRN [id number]  Date of Birth: [birth year]  PCP: Ria Bush, MD  This service was provided via telemedicine. Phone Visit performed on 12/13/2018    Rationale for phone visit reviewed. Patient consented to telephone encounter.    Location of patient: home Location of provider: office, [place] @ [facility] Name of referring provider: N/A   Names of persons and role in encounter: Provider: Ria Bush, MD  Patient: Blake Peterson  Other: N/A   Time on call: 8:13 am - 8:24 am (11 min)   Subjective: CC: f/u PMR HPI:  See prior notes for details. Undergoing slow prednisone taper for PMR. Last visit endorsed swelling, fatigue, possible muscle weakness concern for steroid induced symptoms so we sped up taper by 1mg  every 2 weeks. Went from 8mg  daily to 7mg  daily this past month. Noticing increasing stiffness in shoulders - comes on at night time. He takes prednisone in the mornings. Persistent retention of fluid noted in hands and legs.   DM - continues glimepiride 1mg  while on prednisone.  Lab Results  Component Value Date   HGBA1C 6.8 (H) 09/16/2018   PSA staying 3-4 over last 8 years. Latest PSA 4.9 (10/2018).  Lab Results  Component Value Date   PSA 6.72 (H) 09/16/2018   PSA 3.73 09/07/2017   PSA 4.27 (H) 08/27/2016    Objective/Observations:   No physical exam or vital signs collected unless specifically identified.  Respiratory status: speaks in complete sentences without evident shortness of breath.   Assessment/Plan:  Controlled type 2 diabetes mellitus without complication, without long-term current use of insulin (HCC) Continues glimepiride 1mg  while on prednisone. Fasting cbg yesterday 120s.   Elevated PSA Again reviewed recent readings. Stable PSA 3-4 range over last 8 yrs. PSA recently peaked to 6.7, returned to 4.9 on repeat. Discussed anticipate BPH less likely small low grade cancer, offered urology eval but  reasonable to more routinely monitor PSAs. He opts to recheck PSA later this year.   Polymyalgia rheumatica (HCC) Increasing shoulder stiffness noted with taper to 8mg  then 7mg  - will return to 9mg  dose - last dose he felt well on. Reassess at 3 wk f/u - appt scheduled for 4/15 10am. See below for pedal edema/weakness plan.   Pedal edema Notes persistent hand and leg swelling associated with some muscle weakness since prednisone started - concern for prednisone side effect but difficulty speeding taper due to return of PMR symptoms. Already on chlorthalidone regularly. Will refer to rheum for PMR assistance - may cancel if doing better with taper.    I discussed the assessment and treatment plan with the patient. The patient was provided an opportunity to ask questions and all were answered. The patient agreed with the plan and demonstrated an understanding of the instructions.  Lab Orders  No laboratory test(s) ordered today    Meds ordered this encounter  Medications  . predniSONE (DELTASONE) 1 MG tablet    Sig: Take 4 mg tablet daily with 5mg  (total 9mg )    Dispense:  120 tablet    Refill:  0    This replaces prior 1mg  sig    The patient was advised to call back or seek an in-person evaluation if the symptoms worsen or if the condition fails to improve as anticipated.  Ria Bush, MD

## 2018-12-15 ENCOUNTER — Telehealth: Payer: Self-pay

## 2018-12-15 MED ORDER — PREDNISONE 5 MG PO TABS: 15.0000 mg | ORAL_TABLET | Freq: Every day | ORAL | 0 refills | Status: DC

## 2018-12-15 NOTE — Telephone Encounter (Signed)
plz call - let's increase prednisone to 15mg  daily as new dose.  We will await rheum evaluation as well.

## 2018-12-15 NOTE — Telephone Encounter (Signed)
Received IBC from patient reporting concern with increasing generalized pain. Patient states he is experiencing stiffness and weakness in hands, shoulder pain, and swelling in feet. Pain is affecting sleep and mobility. Pain scale: 5/10 when walking and upon activity.  Patient has requested a sleeping pill if there are no contraindications or an increase in dosage of prednisone.   Patient can be reached at [phone].

## 2018-12-15 NOTE — Telephone Encounter (Signed)
Patient advised.

## 2018-12-21 DIAGNOSIS — M5416 Radiculopathy, lumbar region: Secondary | ICD-10-CM | POA: Diagnosis not present

## 2018-12-21 DIAGNOSIS — E782 Mixed hyperlipidemia: Secondary | ICD-10-CM | POA: Diagnosis not present

## 2018-12-21 DIAGNOSIS — Z20828 Contact with and (suspected) exposure to other viral communicable diseases: Secondary | ICD-10-CM | POA: Diagnosis not present

## 2018-12-21 DIAGNOSIS — R7303 Prediabetes: Secondary | ICD-10-CM | POA: Diagnosis not present

## 2018-12-21 DIAGNOSIS — N401 Enlarged prostate with lower urinary tract symptoms: Secondary | ICD-10-CM | POA: Diagnosis not present

## 2018-12-21 DIAGNOSIS — R600 Localized edema: Secondary | ICD-10-CM | POA: Insufficient documentation

## 2018-12-21 DIAGNOSIS — I2510 Atherosclerotic heart disease of native coronary artery without angina pectoris: Secondary | ICD-10-CM | POA: Diagnosis not present

## 2018-12-21 DIAGNOSIS — R011 Cardiac murmur, unspecified: Secondary | ICD-10-CM | POA: Diagnosis not present

## 2018-12-21 DIAGNOSIS — M1711 Unilateral primary osteoarthritis, right knee: Secondary | ICD-10-CM | POA: Diagnosis not present

## 2018-12-21 DIAGNOSIS — H52203 Unspecified astigmatism, bilateral: Secondary | ICD-10-CM | POA: Diagnosis not present

## 2018-12-21 DIAGNOSIS — M4800 Spinal stenosis, site unspecified: Secondary | ICD-10-CM | POA: Diagnosis not present

## 2018-12-21 DIAGNOSIS — N179 Acute kidney failure, unspecified: Secondary | ICD-10-CM | POA: Diagnosis not present

## 2018-12-21 DIAGNOSIS — R05 Cough: Secondary | ICD-10-CM | POA: Diagnosis not present

## 2018-12-21 DIAGNOSIS — M1990 Unspecified osteoarthritis, unspecified site: Secondary | ICD-10-CM | POA: Insufficient documentation

## 2018-12-21 DIAGNOSIS — H5211 Myopia, right eye: Secondary | ICD-10-CM | POA: Diagnosis not present

## 2018-12-21 DIAGNOSIS — R300 Dysuria: Secondary | ICD-10-CM | POA: Diagnosis not present

## 2018-12-21 DIAGNOSIS — E1151 Type 2 diabetes mellitus with diabetic peripheral angiopathy without gangrene: Secondary | ICD-10-CM | POA: Diagnosis not present

## 2018-12-21 DIAGNOSIS — K7010 Alcoholic hepatitis without ascites: Secondary | ICD-10-CM | POA: Diagnosis not present

## 2018-12-21 DIAGNOSIS — R7989 Other specified abnormal findings of blood chemistry: Secondary | ICD-10-CM | POA: Diagnosis not present

## 2018-12-21 DIAGNOSIS — N2889 Other specified disorders of kidney and ureter: Secondary | ICD-10-CM | POA: Diagnosis not present

## 2018-12-21 DIAGNOSIS — M7989 Other specified soft tissue disorders: Secondary | ICD-10-CM | POA: Diagnosis not present

## 2018-12-21 DIAGNOSIS — E785 Hyperlipidemia, unspecified: Secondary | ICD-10-CM | POA: Diagnosis not present

## 2018-12-21 DIAGNOSIS — Z23 Encounter for immunization: Secondary | ICD-10-CM | POA: Diagnosis not present

## 2018-12-21 DIAGNOSIS — I700 Atherosclerosis of aorta: Secondary | ICD-10-CM | POA: Diagnosis not present

## 2018-12-21 DIAGNOSIS — C3412 Malignant neoplasm of upper lobe, left bronchus or lung: Secondary | ICD-10-CM | POA: Diagnosis not present

## 2018-12-21 DIAGNOSIS — G20 Parkinson's disease: Secondary | ICD-10-CM | POA: Diagnosis not present

## 2018-12-21 DIAGNOSIS — H524 Presbyopia: Secondary | ICD-10-CM | POA: Diagnosis not present

## 2018-12-21 DIAGNOSIS — Z7952 Long term (current) use of systemic steroids: Secondary | ICD-10-CM | POA: Insufficient documentation

## 2018-12-21 DIAGNOSIS — E039 Hypothyroidism, unspecified: Secondary | ICD-10-CM | POA: Diagnosis not present

## 2018-12-21 DIAGNOSIS — M65842 Other synovitis and tenosynovitis, left hand: Secondary | ICD-10-CM | POA: Diagnosis not present

## 2018-12-21 DIAGNOSIS — M47819 Spondylosis without myelopathy or radiculopathy, site unspecified: Secondary | ICD-10-CM | POA: Diagnosis not present

## 2018-12-21 DIAGNOSIS — I10 Essential (primary) hypertension: Secondary | ICD-10-CM | POA: Diagnosis not present

## 2018-12-21 DIAGNOSIS — E559 Vitamin D deficiency, unspecified: Secondary | ICD-10-CM | POA: Diagnosis not present

## 2018-12-21 DIAGNOSIS — Z0000 Encounter for general adult medical examination without abnormal findings: Secondary | ICD-10-CM | POA: Diagnosis not present

## 2018-12-21 DIAGNOSIS — E11319 Type 2 diabetes mellitus with unspecified diabetic retinopathy without macular edema: Secondary | ICD-10-CM | POA: Diagnosis not present

## 2018-12-21 DIAGNOSIS — M65841 Other synovitis and tenosynovitis, right hand: Secondary | ICD-10-CM | POA: Diagnosis not present

## 2019-01-04 ENCOUNTER — Encounter: Payer: Self-pay | Admitting: Family Medicine

## 2019-01-04 ENCOUNTER — Ambulatory Visit (INDEPENDENT_AMBULATORY_CARE_PROVIDER_SITE_OTHER): Payer: PPO | Admitting: Family Medicine

## 2019-01-04 VITALS — BP 135/83 | HR 60 | Ht 68.5 in | Wt 222.0 lb

## 2019-01-04 DIAGNOSIS — M069 Rheumatoid arthritis, unspecified: Secondary | ICD-10-CM

## 2019-01-04 DIAGNOSIS — R600 Localized edema: Secondary | ICD-10-CM | POA: Diagnosis not present

## 2019-01-04 DIAGNOSIS — M059 Rheumatoid arthritis with rheumatoid factor, unspecified: Secondary | ICD-10-CM | POA: Insufficient documentation

## 2019-01-04 DIAGNOSIS — E119 Type 2 diabetes mellitus without complications: Secondary | ICD-10-CM

## 2019-01-04 DIAGNOSIS — M0579 Rheumatoid arthritis with rheumatoid factor of multiple sites without organ or systems involvement: Secondary | ICD-10-CM | POA: Diagnosis not present

## 2019-01-04 DIAGNOSIS — Z7952 Long term (current) use of systemic steroids: Secondary | ICD-10-CM | POA: Diagnosis not present

## 2019-01-04 DIAGNOSIS — M353 Polymyalgia rheumatica: Secondary | ICD-10-CM

## 2019-01-04 DIAGNOSIS — Z79899 Other long term (current) drug therapy: Secondary | ICD-10-CM | POA: Diagnosis not present

## 2019-01-04 DIAGNOSIS — R768 Other specified abnormal immunological findings in serum: Secondary | ICD-10-CM | POA: Diagnosis not present

## 2019-01-04 NOTE — Assessment & Plan Note (Signed)
Saw rheum, found to have rheumatoid arthritis. Unclear if PMR + RA vs just RA. Tolerated higher prednisone taper well, today was last dose of 10mg  prednisone, has telephone f/u planned with rheum later today. Appreciate rheum care. Anticipate will discuss DMARD.

## 2019-01-04 NOTE — Assessment & Plan Note (Addendum)
See above

## 2019-01-04 NOTE — Assessment & Plan Note (Signed)
Improved with stronger prednisone taper.

## 2019-01-04 NOTE — Assessment & Plan Note (Signed)
Endorses good control despite prednisone. Continue amaryl. Will check A1c next visit.

## 2019-01-04 NOTE — Progress Notes (Signed)
Virtual visit completed through [email or web address]. Due to national recommendations of social distancing due to [place] 19, a virtual visit is felt to be most appropriate for this patient at this time.   Patient location: home, son and wife also present on call Provider location:  at [facility], office If any vitals were documented, they were collected by patient at home unless specified below.    BP 135/83 (BP Location: Left Arm, Patient Position: Sitting, Cuff Size: Normal)   Pulse 60   Ht 5' 8.5" (1.74 m)   Wt 222 lb (100.7 kg)   BMI 33.26 kg/m    CC: PMR f/u visit Subjective:    Patient ID: Blake Peterson, male    DOB: 1941-01-25, 78 y.o.   MRN: [id number]  HPI: Blake Peterson is a 78 y.o. male presenting on 01/04/2019 for Follow-up   See prior notes for details.  Established with [facility] clinic rheumatology Dr Annalee Genta - found to have inflammatory arthritis likely rheumatoid (elevated RA test >650, elevated anti-CCP). Started on higher quicker prednisone taper, today is last dose of 10mg . Leg and hand swelling have improved with higher prednisone course. Some persistent R hand swelling and stiffness. Feels diminished sensation of R hand.   9 lb weight loss noted.   Lab Results  Component Value Date   HGBA1C 6.8 (H) 09/16/2018       Relevant past medical, surgical, family and social history reviewed and updated as indicated. Interim medical history since our last visit reviewed. Allergies and medications reviewed and updated. Outpatient Medications Prior to Visit  Medication Sig Dispense Refill  . aspirin 81 MG tablet Take 81 mg by mouth daily.      Marland Kitchen atenolol (TENORMIN) 50 MG tablet TAKE 1 TABLET (50 MG TOTAL) BY MOUTH DAILY. 90 tablet 3  . atorvastatin (LIPITOR) 80 MG tablet TAKE 1 TABLET (80 MG TOTAL) BY MOUTH AT BEDTIME. 90 tablet 3  . chlorthalidone (HYGROTON) 25 MG tablet TAKE 1 TABLET (25 MG TOTAL) BY MOUTH DAILY. 90 tablet 3  . cholecalciferol (VITAMIN D)  1000 UNITS tablet Take 1,000 Units by mouth daily.      . enalapril (VASOTEC) 10 MG tablet TAKE 1 TABLET (10 MG TOTAL) BY MOUTH 2 (TWO) TIMES DAILY. 180 tablet 0  . glimepiride (AMARYL) 1 MG tablet TAKE 1 TABLET (1 MG TOTAL) BY MOUTH DAILY WITH BREAKFAST. 90 tablet 0  . KLOR-CON M10 10 MEQ tablet TAKE 1 TABLET BY MOUTH EVERY DAY 90 tablet 0  . sildenafil (VIAGRA) 100 MG tablet Take 100 mg by mouth daily as needed.      . predniSONE (DELTASONE) 1 MG tablet Take 4 mg tablet daily with 5mg  (total 9mg ) 120 tablet 0  . predniSONE (DELTASONE) 5 MG tablet Take 1 tablet (5 mg total) by mouth daily with breakfast. 30 tablet 0  . predniSONE (DELTASONE) 5 MG tablet Take 3 tablets (15 mg total) by mouth daily with breakfast. 90 tablet 0   No facility-administered medications prior to visit.      Per HPI unless specifically indicated in ROS section below Review of Systems Objective:    BP 135/83 (BP Location: Left Arm, Patient Position: Sitting, Cuff Size: Normal)   Pulse 60   Ht 5' 8.5" (1.74 m)   Wt 222 lb (100.7 kg)   BMI 33.26 kg/m   Wt Readings from Last 3 Encounters:  01/04/19 222 lb (100.7 kg)  11/14/18 231 lb 4 oz (104.9 kg)  09/23/18 222 lb 12 oz (  101 kg)     Physical exam: Gen: alert, NAD, not ill appearing Pulm: speaks in complete sentences without increased work of breathing Psych: normal mood, normal thought content      Results for orders placed or performed in visit on 61/60/73  Basic metabolic panel  Result Value Ref Range   Sodium 134 (L) 135 - 145 mEq/L   Potassium 3.5 3.5 - 5.1 mEq/L   Chloride 93 (L) 96 - 112 mEq/L   CO2 34 (H) 19 - 32 mEq/L   Glucose, Bld 133 (H) 70 - 99 mg/dL   BUN 18 6 - 23 mg/dL   Creatinine, Ser 0.92 0.40 - 1.50 mg/dL   Calcium 10.4 8.4 - 10.5 mg/dL   GFR 79.97 >60.00 mL/min  PSA, Total with Reflex to PSA, Free  Result Value Ref Range   PSA, Total 4.9 (H) < OR = 4.0 ng/mL  reflex PSA, Free  Result Value Ref Range   PSA, Free 0.6 ng/mL    PSA, % Free 12 (L) >25 % (calc)   Assessment & Plan:  I did ask patient to schedule 3 mo DM f/u visit.  Problem List Items Addressed This Visit    Rheumatoid arthritis ([place]) - Primary    Saw rheum, found to have rheumatoid arthritis. Unclear if PMR + RA vs just RA. Tolerated higher prednisone taper well, today was last dose of 10mg  prednisone, has telephone f/u planned with rheum later today. Appreciate rheum care. Anticipate will discuss DMARD.       PMR (polymyalgia rheumatica) (HCC)    See above.       Pedal edema    Improved with stronger prednisone taper.      Controlled type 2 diabetes mellitus without complication, without long-term current use of insulin ([place])    Endorses good control despite prednisone. Continue amaryl. Will check A1c next visit.           No orders of the defined types were placed in this encounter.  No orders of the defined types were placed in this encounter.   Follow up plan: Return in about 3 months (around 04/05/2019) for follow up visit.  Ria Bush, MD

## 2019-01-18 ENCOUNTER — Other Ambulatory Visit: Payer: Self-pay | Admitting: Family Medicine

## 2019-02-01 DIAGNOSIS — Z79899 Other long term (current) drug therapy: Secondary | ICD-10-CM | POA: Diagnosis not present

## 2019-02-01 DIAGNOSIS — M059 Rheumatoid arthritis with rheumatoid factor, unspecified: Secondary | ICD-10-CM | POA: Diagnosis not present

## 2019-02-01 DIAGNOSIS — Z7952 Long term (current) use of systemic steroids: Secondary | ICD-10-CM | POA: Diagnosis not present

## 2019-02-15 ENCOUNTER — Ambulatory Visit (INDEPENDENT_AMBULATORY_CARE_PROVIDER_SITE_OTHER): Payer: PPO | Admitting: Family Medicine

## 2019-02-15 ENCOUNTER — Encounter: Payer: Self-pay | Admitting: Family Medicine

## 2019-02-15 ENCOUNTER — Other Ambulatory Visit: Payer: Self-pay

## 2019-02-15 VITALS — BP 128/64 | HR 79 | Temp 98.5°F | Resp 18 | Ht 68.5 in | Wt 228.5 lb

## 2019-02-15 DIAGNOSIS — R600 Localized edema: Secondary | ICD-10-CM

## 2019-02-15 DIAGNOSIS — S93402A Sprain of unspecified ligament of left ankle, initial encounter: Secondary | ICD-10-CM | POA: Diagnosis not present

## 2019-02-15 NOTE — Assessment & Plan Note (Signed)
Given new onset of pain with ambulation and some pain with eversion of the ankle, suspect possible sprain. Advised brace and rest. If not improved, recommended sports medicine f/u.

## 2019-02-15 NOTE — Patient Instructions (Signed)
Swelling 1) Get compression socks to wear on both legs 2) Get an ankle brace to wear on the left 3) Rest and elevate you legs as much as possible  If pain is still severe and not improving after 2 weeks -- return to see Dr Lorelei Pont (the sports medicine doctor).

## 2019-02-15 NOTE — Assessment & Plan Note (Signed)
Chronic issue for patient. He recently stopped prednisone so there may be some improvement there. No known hx of heart failure per patient. Advised trial of compression stockings. If not improvement could consider repeat ECHO

## 2019-02-15 NOTE — Progress Notes (Signed)
Subjective:     Blake Peterson is a 78 y.o. male presenting for Foot Swelling (bilateral. Left foot worsening x weeks. He was diagnosed with RA and was on Prednisone for a while and stopped about 10 days ago but swelling was getting radually worse even before stopping medication. He is still taking Methotrexate.)     HPI   #Left foot swelling - on and off x 1 year - 4/15 noticed it worsening at that time - he is now off prednisone and stared taking methotrexate  - swelling is worse and now it is difficult to walk - he will need to sit down - hurts on the medial ankle when standing/walking - this is where the pain has always been - also has pain on the right but not as bad as the left   Review of Systems  Respiratory: Negative for shortness of breath.   Cardiovascular: Positive for leg swelling. Negative for chest pain.  Musculoskeletal: Positive for joint swelling.     Social History   Tobacco Use  Smoking Status Former Smoker  . Last attempt to quit: 11/10/1984  . Years since quitting: 34.2  Smokeless Tobacco Never Used        Objective:    BP Readings from Last 3 Encounters:  02/15/19 128/64  01/04/19 135/83  11/14/18 126/66   Wt Readings from Last 3 Encounters:  02/15/19 228 lb 8 oz (103.6 kg)  01/04/19 222 lb (100.7 kg)  11/14/18 231 lb 4 oz (104.9 kg)    BP 128/64   Pulse 79   Temp 98.5 F (36.9 C)   Resp 18   Ht 5' 8.5" (1.74 m)   Wt 228 lb 8 oz (103.6 kg)   SpO2 98%   BMI 34.24 kg/m    Physical Exam Constitutional:      Appearance: Normal appearance. He is obese. He is not ill-appearing or diaphoretic.  HENT:     Head: Normocephalic and atraumatic.     Right Ear: External ear normal.     Left Ear: External ear normal.     Nose: Nose normal.  Eyes:     General: No scleral icterus.    Extraocular Movements: Extraocular movements intact.     Conjunctiva/sclera: Conjunctivae normal.  Neck:     Musculoskeletal: Neck supple.   Cardiovascular:     Rate and Rhythm: Normal rate and regular rhythm.     Heart sounds: No murmur.  Pulmonary:     Effort: Pulmonary effort is normal. No respiratory distress.     Breath sounds: Normal breath sounds. No wheezing.  Musculoskeletal:     Comments: 1+ pitting to the mid shin. 2+ in the foot and ankle.  TTP along the left medial ankle No heel ttp Normal ROM and strength testing for the ankle, though pain with left foot eversion.   Skin:    General: Skin is warm and dry.  Neurological:     Mental Status: He is alert. Mental status is at baseline.  Psychiatric:        Mood and Affect: Mood normal.           Assessment & Plan:   Problem List Items Addressed This Visit      Musculoskeletal and Integument   Sprain of left ankle - Primary    Given new onset of pain with ambulation and some pain with eversion of the ankle, suspect possible sprain. Advised brace and rest. If not improved, recommended sports medicine f/u.  Other   Bilateral edema of lower extremity    Chronic issue for patient. He recently stopped prednisone so there may be some improvement there. No known hx of heart failure per patient. Advised trial of compression stockings. If not improvement could consider repeat ECHO          Return in about 2 weeks (around 03/01/2019) for if not improved for sports medicine.  Lesleigh Noe, MD

## 2019-02-23 ENCOUNTER — Other Ambulatory Visit: Payer: Self-pay

## 2019-02-23 ENCOUNTER — Ambulatory Visit (INDEPENDENT_AMBULATORY_CARE_PROVIDER_SITE_OTHER): Payer: PPO | Admitting: Family Medicine

## 2019-02-23 ENCOUNTER — Encounter: Payer: Self-pay | Admitting: Family Medicine

## 2019-02-23 VITALS — BP 124/64 | HR 81 | Temp 97.9°F | Ht 68.5 in | Wt 227.3 lb

## 2019-02-23 DIAGNOSIS — R251 Tremor, unspecified: Secondary | ICD-10-CM | POA: Diagnosis not present

## 2019-02-23 DIAGNOSIS — R29898 Other symptoms and signs involving the musculoskeletal system: Secondary | ICD-10-CM

## 2019-02-23 DIAGNOSIS — E119 Type 2 diabetes mellitus without complications: Secondary | ICD-10-CM | POA: Diagnosis not present

## 2019-02-23 DIAGNOSIS — M0579 Rheumatoid arthritis with rheumatoid factor of multiple sites without organ or systems involvement: Secondary | ICD-10-CM

## 2019-02-23 DIAGNOSIS — M791 Myalgia, unspecified site: Secondary | ICD-10-CM | POA: Diagnosis not present

## 2019-02-23 DIAGNOSIS — R600 Localized edema: Secondary | ICD-10-CM

## 2019-02-23 LAB — CBC WITH DIFFERENTIAL/PLATELET
Basophils Absolute: 0.1 10*3/uL (ref 0.0–0.1)
Basophils Relative: 1 % (ref 0.0–3.0)
Eosinophils Absolute: 0.1 10*3/uL (ref 0.0–0.7)
Eosinophils Relative: 0.8 % (ref 0.0–5.0)
HCT: 42.4 % (ref 39.0–52.0)
Hemoglobin: 14.6 g/dL (ref 13.0–17.0)
Lymphocytes Relative: 11.4 % — ABNORMAL LOW (ref 12.0–46.0)
Lymphs Abs: 1.5 10*3/uL (ref 0.7–4.0)
MCHC: 34.5 g/dL (ref 30.0–36.0)
MCV: 88.7 fl (ref 78.0–100.0)
Monocytes Absolute: 1.3 10*3/uL — ABNORMAL HIGH (ref 0.1–1.0)
Monocytes Relative: 10.4 % (ref 3.0–12.0)
Neutro Abs: 9.8 10*3/uL — ABNORMAL HIGH (ref 1.4–7.7)
Neutrophils Relative %: 76.4 % (ref 43.0–77.0)
Platelets: 373 10*3/uL (ref 150.0–400.0)
RBC: 4.78 Mil/uL (ref 4.22–5.81)
RDW: 15.6 % — ABNORMAL HIGH (ref 11.5–15.5)
WBC: 12.9 10*3/uL — ABNORMAL HIGH (ref 4.0–10.5)

## 2019-02-23 LAB — COMPREHENSIVE METABOLIC PANEL
ALT: 19 U/L (ref 0–53)
AST: 17 U/L (ref 0–37)
Albumin: 3.7 g/dL (ref 3.5–5.2)
Alkaline Phosphatase: 80 U/L (ref 39–117)
BUN: 20 mg/dL (ref 6–23)
CO2: 31 mEq/L (ref 19–32)
Calcium: 9.9 mg/dL (ref 8.4–10.5)
Chloride: 95 mEq/L — ABNORMAL LOW (ref 96–112)
Creatinine, Ser: 0.82 mg/dL (ref 0.40–1.50)
GFR: 91.26 mL/min (ref >60.00)
Glucose, Bld: 99 mg/dL (ref 70–99)
Potassium: 3.4 mEq/L — ABNORMAL LOW (ref 3.5–5.1)
Sodium: 135 mEq/L (ref 135–145)
Total Bilirubin: 1 mg/dL (ref 0.2–1.2)
Total Protein: 6.5 g/dL (ref 6.0–8.3)

## 2019-02-23 LAB — BRAIN NATRIURETIC PEPTIDE: Pro B Natriuretic peptide (BNP): 55 pg/mL (ref 0.0–100.0)

## 2019-02-23 LAB — HEMOGLOBIN A1C: Hgb A1c MFr Bld: 7.4 % — ABNORMAL HIGH (ref 4.6–6.5)

## 2019-02-23 LAB — VITAMIN B12: Vitamin B-12: 338 pg/mL (ref 211–911)

## 2019-02-23 LAB — CK: Total CK: 55 U/L (ref 7–232)

## 2019-02-23 LAB — TSH: TSH: 2.34 u[IU]/mL (ref 0.35–4.50)

## 2019-02-23 NOTE — Progress Notes (Signed)
This visit was conducted in person.  BP 124/64 (BP Location: Left Arm, Patient Position: Sitting, Cuff Size: Normal)   Pulse 81   Temp 97.9 F (36.6 C) (Oral)   Ht 5' 8.5" (1.74 m)   Wt 227 lb 5 oz (103.1 kg)   SpO2 98%   BMI 34.06 kg/m    CC: leg and foot pain/swelling Subjective:    Patient ID: Blake Peterson, male    DOB: 05/09/1941, 78 y.o.   MRN: [id number]  HPI: Blake Peterson is a 78 y.o. male presenting on 02/23/2019 for Leg Pain (C/o bilateral leg pain. ) and Leg Swelling (C/o swelling in bilateral lower leg swelling from knees to ankles. )   Ongoing bilateral leg pain and swelling L>R from ankles to knees over last few weeks. Feels legs are getting weaker, affecting ability to walk. Sometimes has trouble lifting legs. Noticing exertional dyspnea over the past week ie walking from parking lot into exam room. No chest pain, cough, dizziness, headaches. No back pain. Decreased appetite noted.   Notes increasing tremor, as well as some arm/hand weakness - now has to use both hands to drink full cup of coffee because it is too heavy. No loss of smell. Ongoing morning stiffness - attributed to RA - improves as day progresses. No paresthesias or peripheral neuropathy symptoms.   No redness or warmth of legs. No recent long car rides. No fmhx or personal history of blood clots.  No fevers/chills. No cough. + cold intolerance.   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 Planned rheum f/u later this month.   Seen last week by Dr Einar Pheasant dx with L ankle sprain, rec brace - he did not try brace. Has not been using compression stockings.      Relevant past medical, surgical, family and social history reviewed and updated as indicated. Interim medical history since our last visit reviewed. Allergies and medications reviewed and updated. Outpatient Medications Prior to Visit  Medication Sig Dispense Refill  . aspirin 81 MG  tablet Take 81 mg by mouth daily.      Marland Kitchen atenolol (TENORMIN) 50 MG tablet TAKE 1 TABLET (50 MG TOTAL) BY MOUTH DAILY. 90 tablet 3  . atorvastatin (LIPITOR) 80 MG tablet TAKE 1 TABLET (80 MG TOTAL) BY MOUTH AT BEDTIME. 90 tablet 3  . chlorthalidone (HYGROTON) 25 MG tablet TAKE 1 TABLET (25 MG TOTAL) BY MOUTH DAILY. 90 tablet 3  . cholecalciferol (VITAMIN D) 1000 UNITS tablet Take 1,000 Units by mouth daily.      . enalapril (VASOTEC) 10 MG tablet TAKE 1 TABLET BY MOUTH TWICE A DAY 180 tablet 0  . glimepiride (AMARYL) 1 MG tablet TAKE 1 TABLET (1 MG TOTAL) BY MOUTH DAILY WITH BREAKFAST. 90 tablet 0  . KLOR-CON M10 10 MEQ tablet TAKE 1 TABLET BY MOUTH EVERY DAY 90 tablet 0  . methotrexate (RHEUMATREX) 2.5 MG tablet 7 tabs (17.5 mg ) weekly x  12 weeks. #84 tabs    . sildenafil (VIAGRA) 100 MG tablet Take 100 mg by mouth daily as needed.       No facility-administered medications prior to visit.      Per HPI unless specifically indicated in ROS section below Review of Systems Objective:    BP 124/64 (BP Location: Left Arm, Patient Position: Sitting, Cuff Size: Normal)   Pulse 81   Temp 97.9 F (36.6 C) (Oral)  Ht 5' 8.5" (1.74 m)   Wt 227 lb 5 oz (103.1 kg)   SpO2 98%   BMI 34.06 kg/m   Wt Readings from Last 3 Encounters:  02/23/19 227 lb 5 oz (103.1 kg)  02/15/19 228 lb 8 oz (103.6 kg)  01/04/19 222 lb (100.7 kg)    Physical Exam Vitals signs and nursing note reviewed.  Constitutional:      Appearance: Normal appearance. He is not ill-appearing.  HENT:     Head: Normocephalic and atraumatic.     Mouth/Throat:     Mouth: Mucous membranes are moist.     Pharynx: No posterior oropharyngeal erythema.  Cardiovascular:     Rate and Rhythm: Normal rate and regular rhythm.     Pulses: Normal pulses.     Heart sounds: Normal heart sounds. No murmur.  Pulmonary:     Effort: Pulmonary effort is normal. No respiratory distress.     Breath sounds: Normal breath sounds. No wheezing,  rhonchi or rales.  Musculoskeletal:     Right lower leg: Edema (1+) present.     Left lower leg: Edema (1+) present.     Comments: Pain at R thigh with testing hip flexors against resistance, mild discomfort with int/ext rotation at R hip   Skin:    General: Skin is warm and dry.     Findings: No erythema or rash.  Neurological:     Mental Status: He is alert.     Cranial Nerves: Cranial nerves are intact.     Sensory: Sensation is intact.     Motor: Weakness and tremor present. No atrophy.     Comments:  Needs assistance getting on exam table CN 2-12 intact Right handed  Diminished strength BLE, BUE Sensation intact R > L grip strength Unsteady unure gait Action tremor present No masked fascies  Psychiatric:        Mood and Affect: Mood normal.        Behavior: Behavior normal.       Results for orders placed or performed in visit on 02/23/19  Comprehensive metabolic panel  Result Value Ref Range   Sodium 135 135 - 145 mEq/L   Potassium 3.4 (L) 3.5 - 5.1 mEq/L   Chloride 95 (L) 96 - 112 mEq/L   CO2 31 19 - 32 mEq/L   Glucose, Bld 99 70 - 99 mg/dL   BUN 20 6 - 23 mg/dL   Creatinine, Ser 0.82 0.40 - 1.50 mg/dL   Total Bilirubin 1.0 0.2 - 1.2 mg/dL   Alkaline Phosphatase 80 39 - 117 U/L   AST 17 0 - 37 U/L   ALT 19 0 - 53 U/L   Total Protein 6.5 6.0 - 8.3 g/dL   Albumin 3.7 3.5 - 5.2 g/dL   Calcium 9.9 8.4 - 10.5 mg/dL   GFR 91.26 >60.00 mL/min  TSH  Result Value Ref Range   TSH 2.34 0.35 - 4.50 uIU/mL  CBC with Differential/Platelet  Result Value Ref Range   WBC 12.9 (H) 4.0 - 10.5 K/uL   RBC 4.78 4.22 - 5.81 Mil/uL   Hemoglobin 14.6 13.0 - 17.0 g/dL   HCT 42.4 39.0 - 52.0 %   MCV 88.7 78.0 - 100.0 fl   MCHC 34.5 30.0 - 36.0 g/dL   RDW 15.6 (H) 11.5 - 15.5 %   Platelets 373.0 150.0 - 400.0 K/uL   Neutrophils Relative % 76.4 43.0 - 77.0 %   Lymphocytes Relative 11.4 (L) 12.0 - 46.0 %  Monocytes Relative 10.4 3.0 - 12.0 %   Eosinophils Relative 0.8 0.0 -  5.0 %   Basophils Relative 1.0 0.0 - 3.0 %   Neutro Abs 9.8 (H) 1.4 - 7.7 K/uL   Lymphs Abs 1.5 0.7 - 4.0 K/uL   Monocytes Absolute 1.3 (H) 0.1 - 1.0 K/uL   Eosinophils Absolute 0.1 0.0 - 0.7 K/uL   Basophils Absolute 0.1 0.0 - 0.1 K/uL  Vitamin B12  Result Value Ref Range   Vitamin B-12 338 211 - 911 pg/mL  Hemoglobin A1c  Result Value Ref Range   Hgb A1c MFr Bld 7.4 (H) 4.6 - 6.5 %  Brain natriuretic peptide  Result Value Ref Range   Pro B Natriuretic peptide (BNP) 55.0 0.0 - 100.0 pg/mL  CK  Result Value Ref Range   Total CK 55 7 - 232 U/L   Assessment & Plan:   Problem List Items Addressed This Visit    Weakness of both lower extremities - Primary    New, both upper and lower extremity weakness noticeable on exam. ?med related (prednisone vs MTX). Will start with labwork, if unrevealing refer to neuro for further evaluation. Pt agrees with plan. Will see if we can add folate level to blood in lab.       Relevant Orders   Comprehensive metabolic panel (Completed)   TSH (Completed)   CBC with Differential/Platelet (Completed)   Vitamin B12 (Completed)   CK (Completed)   Ambulatory referral to Neurology   Tremor    Present previously, but progressively worsening. ?med related - consider neuro eval.       Relevant Orders   Ambulatory referral to Neurology   Rheumatoid arthritis ([place])    Found to have seropositive RA by rheum, started on methotrexate. Joint pains are largely improved, ongoing morning stiffness noted, now with weakness - see below.       Pedal edema    Check BNP with other labs today given leg swelling associated with new exertional dyspnea, consider echo pending results. Bilateral edema not consistent with DVT.       Relevant Orders   Comprehensive metabolic panel (Completed)   TSH (Completed)   CBC with Differential/Platelet (Completed)   Brain natriuretic peptide (Completed)   Controlled type 2 diabetes mellitus without complication, without  long-term current use of insulin (HCC)    Update A1c today. He continues glimepiride 1mg  daily.       Relevant Orders   Hemoglobin A1c (Completed)    Other Visit Diagnoses    Myalgia       Relevant Orders   CK (Completed)      No orders of the defined types were placed in this encounter.  Orders Placed This Encounter  Procedures  . Comprehensive metabolic panel  . TSH  . CBC with Differential/Platelet  . Vitamin B12  . Hemoglobin A1c  . Brain natriuretic peptide  . CK  . Ambulatory referral to Neurology    Referral Priority:   Routine    Referral Type:   Consultation    Referral Reason:   Specialty Services Required    Requested Specialty:   Neurology    Number of Visits Requested:   1    Follow up plan: No follow-ups on file.  Ria Bush, MD

## 2019-02-23 NOTE — Patient Instructions (Signed)
Labs today We will be in touch with results and plan.  We may refer you to neurologist for further evaluation of leg weakness.

## 2019-02-24 ENCOUNTER — Encounter: Payer: Self-pay | Admitting: Radiology

## 2019-02-24 ENCOUNTER — Encounter: Payer: Self-pay | Admitting: Family Medicine

## 2019-02-24 ENCOUNTER — Other Ambulatory Visit (INDEPENDENT_AMBULATORY_CARE_PROVIDER_SITE_OTHER): Payer: PPO

## 2019-02-24 DIAGNOSIS — R29898 Other symptoms and signs involving the musculoskeletal system: Secondary | ICD-10-CM | POA: Insufficient documentation

## 2019-02-24 DIAGNOSIS — R5383 Other fatigue: Secondary | ICD-10-CM | POA: Diagnosis not present

## 2019-02-24 NOTE — Assessment & Plan Note (Signed)
Present previously, but progressively worsening. ?med related - consider neuro eval.

## 2019-02-24 NOTE — Assessment & Plan Note (Addendum)
Check BNP with other labs today given leg swelling associated with new exertional dyspnea, consider echo pending results. Bilateral edema not consistent with DVT.

## 2019-02-24 NOTE — Assessment & Plan Note (Signed)
Found to have seropositive RA by rheum, started on methotrexate. Joint pains are largely improved, ongoing morning stiffness noted, now with weakness - see below.

## 2019-02-24 NOTE — Assessment & Plan Note (Signed)
Update A1c today. He continues glimepiride 1mg  daily.

## 2019-02-24 NOTE — Assessment & Plan Note (Addendum)
New, both upper and lower extremity weakness noticeable on exam. ?med related (prednisone vs MTX). Will start with labwork, if unrevealing refer to neuro for further evaluation. Pt agrees with plan. Will see if we can add folate level to blood in lab.

## 2019-02-27 LAB — FOLATE: Folate: >23.9 ng/mL (ref >5.9)

## 2019-03-01 ENCOUNTER — Other Ambulatory Visit: Payer: Self-pay | Admitting: Family Medicine

## 2019-03-02 NOTE — Progress Notes (Addendum)
NEUROLOGY CONSULTATION NOTE  KARRIEM MUENCH MRN: [id number] DOB: 06/09/1941  Referring provider: Ria Bush, MD Primary care provider: Ria Bush, MD  Reason for consult:  Weakness, tremor  HISTORY OF PRESENT ILLNESS: Blake Peterson is a 78 year old Caucasian man with rheumatoid arthritis,polymyalgia rheumatica and type 2 diabetes mellitus who presents for tremor and lower extremity weakness.  History supplemented by referring provider notes.  He was diagnosed with polymyalgia rheumatica in September but was diagnosed with seropositive rheumatoid arthritis in April.  He was started on methotrexate and prednisone.  As he was tapering off of the prednisone, you started having problems.  He completely tapered off of prednisone about 3-4 weeks ago.  Over the past several weeks, he has had bilateral leg pain and swelling, left worse than right, from the knees down to the ankles.  Pain is described as a cramping.  He also reports cramping and burning in the bottom of the feet.  He notes gradually progressed weakness.  It is sometimes difficult to lift his legs.  It has affected his walking but has not had any falls.  He does reports continued stiffness in the knees.  He also reports new exertional dyspnea.  He feels weak in the hands.  He endorses stiffness and has previously endorsed burning in the right index finger.  He has longstanding tremor and this has gotten worse over past few weeks.  Tremor causes difficulty pouring coffee or holding the mug.  He has morning stiffness attributed to the RA and is not new.  He has type 2 diabetes mellitus.  Hgb A1c last week was 7.4 (it was 6.8 5 months ago).  TSH 2.34, B12 338, CK 55 and BNP 55.  Her sister has tremor.  PAST MEDICAL HISTORY: Past Medical History:  Diagnosis Date   Basal cell carcinoma 12/2016   R anterior tibia   Cataract    Diverticulosis of colon    History of colonic polyps    HLD (hyperlipidemia)    HTN  (hypertension)    Hypertensive retinopathy of both eyes, grade 1    [name]   Obesity, Class I, BMI 30-34.9    Prediabetes    Wears hearing aid in left ear     PAST SURGICAL HISTORY: Past Surgical History:  Procedure Laterality Date   CATARACT EXTRACTION, BILATERAL  06/2017   COLONOSCOPY  11/2012   mild-mod diverticulosis, int hem, rec rpt 5 yrs [facility] Plan)   COLONOSCOPY  01/2018   2 TA, mod diverticulosis, rpt 5 yrs [facility] Plan)   POLYPECTOMY     UMBILICAL HERNIA REPAIR  01/07/04    MEDICATIONS: Current Outpatient Medications on File Prior to Visit  Medication Sig Dispense Refill   aspirin 81 MG tablet Take 81 mg by mouth daily.       atenolol (TENORMIN) 50 MG tablet TAKE 1 TABLET (50 MG TOTAL) BY MOUTH DAILY. 90 tablet 3   atorvastatin (LIPITOR) 80 MG tablet TAKE 1 TABLET (80 MG TOTAL) BY MOUTH AT BEDTIME. 90 tablet 3   chlorthalidone (HYGROTON) 25 MG tablet TAKE 1 TABLET (25 MG TOTAL) BY MOUTH DAILY. 90 tablet 3   cholecalciferol (VITAMIN D) 1000 UNITS tablet Take 1,000 Units by mouth daily.       enalapril (VASOTEC) 10 MG tablet TAKE 1 TABLET BY MOUTH TWICE A DAY 180 tablet 0   glimepiride (AMARYL) 1 MG tablet TAKE 1 TABLET (1 MG TOTAL) BY MOUTH DAILY WITH BREAKFAST. 90 tablet 1   KLOR-CON M10 10 MEQ  tablet TAKE 1 TABLET BY MOUTH EVERY DAY 90 tablet 1   methotrexate (RHEUMATREX) 2.5 MG tablet 7 tabs (17.5 mg ) weekly x  12 weeks. #84 tabs     sildenafil (VIAGRA) 100 MG tablet Take 100 mg by mouth daily as needed.       No current facility-administered medications on file prior to visit.     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  Social Needs   Financial resource strain: Not on file   Food insecurity    Worry: Not on file    Inability: Not on file   Transportation needs    Medical: Not on file    Non-medical: Not on file  Tobacco Use   Smoking status: Former Smoker    Quit date: 11/10/1984    Years since quitting: 34.3   Smokeless tobacco: Never Used  Substance and Sexual Activity   Alcohol use: Yes    Alcohol/week: 2.0 - 3.0 standard drinks    Types: 2 - 3 Glasses of wine per week   Drug use: No   Sexual activity: Never  Lifestyle   Physical activity    Days per week: Not on file    Minutes per session: Not on file   Stress: Not on file  Relationships   Social connections    Talks on phone: Not on file    Gets together: Not on file    Attends religious service: Not on file    Active member of club or organization: Not on file    Attends meetings of clubs or organizations: Not on file    Relationship status: Not on file   Intimate partner violence    Fear of current or ex partner: Not on file    Emotionally abused: Not on file    Physically abused: Not on file    Forced sexual activity: Not on file  Other Topics Concern   Not on file  Social History Narrative   R handed   Lives with wife   Occupation: retired, was Clinical biochemist   Activity: Electrical engineer, tries walking 3x/wk   Diet: good water, fruits/vegetables daily    REVIEW OF SYSTEMS: Constitutional: No fevers, chills, or sweats, no generalized fatigue, change in appetite Eyes: No visual changes, double vision, eye pain Ear, nose and throat: No hearing loss, ear pain, nasal congestion, sore throat Cardiovascular: No chest pain,  palpitations Respiratory:  No shortness of breath at rest or with exertion, wheezes GastrointestinaI: No nausea, vomiting, diarrhea, abdominal pain, fecal incontinence Genitourinary:  No dysuria, urinary retention or frequency Musculoskeletal:  No neck pain, back pain Integumentary: No rash, pruritus, skin lesions Neurological: as above Psychiatric: No depression, insomnia, anxiety Endocrine: No palpitations, fatigue, diaphoresis, mood swings, change in appetite, change in weight, increased  thirst Hematologic/Lymphatic:  No purpura, petechiae. Allergic/Immunologic: no itchy/runny eyes, nasal congestion, recent allergic reactions, rashes  PHYSICAL EXAM: Blood pressure 135/67, pulse 85, height 5\' 9"  (1.753 m), weight 225 lb (102.1 kg), SpO2 96 %. General: No acute distress.  Patient appears well-groomed.  Head:  Normocephalic/atraumatic Eyes:  fundi examined but not visualized Neck: supple, no paraspinal tenderness, full range of motion Back: No paraspinal tenderness Heart: regular rate and rhythm Lungs: Clear to auscultation bilaterally. Vascular: No carotid bruits. Neurological Exam: Mental status: alert and oriented to person, place, and time, recent and remote memory intact, fund of knowledge intact, attention and concentration intact, speech fluent and not dysarthric, language intact. Cranial nerves: CN I: not tested CN II: pupils equal, round and reactive to light, visual fields intact CN III, IV, VI:  full range of motion, no nystagmus, no ptosis CN V: facial sensation intact CN VII: upper and lower face symmetric CN VIII: hearing intact CN IX, X: gag intact, uvula midline CN XI: sternocleidomastoid and trapezius muscles intact CN XII: tongue midline Bulk & Tone: normal, no fasciculations or rigidity. Motor:  Possible 5-/5 in bilateral in hand grip bilaterally but otherwise 5/5 throughout.  Some decrease effort due to pain but full strength with encouragement.  Postural and  kinetic tremor in hands.  No bradykinesia Sensation:  Pinprick and vibration sensation intact. Deep Tendon Reflexes:  2+ throughout, toes downgoing.  Finger to nose testing:  Without dysmetria.  Postural and kinetic tremor. Heel to shin:  Without dysmetria. Gait:  Stiff posture.  Reduced stride but no shuffling.  Able to turn in 5 steps; unable to tandem walk. Romberg negative.  IMPRESSION: 1.  I don't really appreciate any objective muscle weakness on exam.  I think pain and stiffness is contributing to his subjective weakness, secondary to his arthritis.  He states stiffness and joint pain has gradually gotten worse since stopping prednisone.  He does not exhibit upper motor neuron signs such as spasticity and hyperreflexia to suggest myelopathy or intracranial abnormality.  He does have diabetes but no significant neuropathy on exam, although it may be present.  Given overall unremarkable muscle exam and normal CK, myopathy unlikely but will get NCV-EMG to assess further. 2.  Benign essential tremor.  Perhaps increased due to recent prednisone.  There is a family history.  He does not exhibit other symptoms to suggest Parkinson's disease.  PLAN: 1.  NCV-EMG.  Further recommendations pending results. 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  However, there is no evidence of myopathy or polyneuropathy to explain his weakness.  I think his perceived weakness is related to his painful arthritis.  No further workup warranted.    2.  Defers treatment for tremor at this time. If it starts to negatively impact activity, he will contact me.  Thank you for allowing me to take part in the care of this patient.  Metta Clines, DO  CC:  Ria Bush, MD

## 2019-03-03 ENCOUNTER — Other Ambulatory Visit: Payer: Self-pay

## 2019-03-03 ENCOUNTER — Encounter: Payer: Self-pay | Admitting: Neurology

## 2019-03-03 ENCOUNTER — Ambulatory Visit (INDEPENDENT_AMBULATORY_CARE_PROVIDER_SITE_OTHER): Payer: PPO | Admitting: Neurology

## 2019-03-03 VITALS — BP 135/67 | HR 85 | Ht 69.0 in | Wt 225.0 lb

## 2019-03-03 DIAGNOSIS — M6281 Muscle weakness (generalized): Secondary | ICD-10-CM

## 2019-03-03 DIAGNOSIS — M0579 Rheumatoid arthritis with rheumatoid factor of multiple sites without organ or systems involvement: Secondary | ICD-10-CM | POA: Diagnosis not present

## 2019-03-03 DIAGNOSIS — G250 Essential tremor: Secondary | ICD-10-CM

## 2019-03-03 NOTE — Patient Instructions (Signed)
We will check a nerve and muscle test Further recommendations pending results  The tremor is benign.  As long as it is manageable, I would not treat it.  If it starts interfering with your quality of life, we can start a medication for it.

## 2019-03-06 DIAGNOSIS — R600 Localized edema: Secondary | ICD-10-CM | POA: Diagnosis not present

## 2019-03-06 DIAGNOSIS — M059 Rheumatoid arthritis with rheumatoid factor, unspecified: Secondary | ICD-10-CM | POA: Diagnosis not present

## 2019-03-06 DIAGNOSIS — Z79899 Other long term (current) drug therapy: Secondary | ICD-10-CM | POA: Diagnosis not present

## 2019-03-06 DIAGNOSIS — R768 Other specified abnormal immunological findings in serum: Secondary | ICD-10-CM | POA: Diagnosis not present

## 2019-03-07 ENCOUNTER — Ambulatory Visit (INDEPENDENT_AMBULATORY_CARE_PROVIDER_SITE_OTHER): Payer: PPO | Admitting: Neurology

## 2019-03-07 ENCOUNTER — Other Ambulatory Visit: Payer: Self-pay

## 2019-03-07 ENCOUNTER — Telehealth: Payer: Self-pay | Admitting: Family Medicine

## 2019-03-07 DIAGNOSIS — G5621 Lesion of ulnar nerve, right upper limb: Secondary | ICD-10-CM

## 2019-03-07 DIAGNOSIS — M5412 Radiculopathy, cervical region: Secondary | ICD-10-CM

## 2019-03-07 DIAGNOSIS — M6281 Muscle weakness (generalized): Secondary | ICD-10-CM

## 2019-03-07 MED ORDER — ACETAMINOPHEN 500 MG PO TABS: 500.0000 mg | ORAL_TABLET | Freq: Two times a day (BID) | ORAL | 0 refills | Status: DC | PRN

## 2019-03-07 MED ORDER — TRAMADOL HCL 50 MG PO TABS: 25.0000 mg | ORAL_TABLET | Freq: Two times a day (BID) | ORAL | 0 refills | Status: DC | PRN

## 2019-03-07 NOTE — Procedures (Signed)
[facility]  [address] [place], [address]  [place], [place] [address] Tel: [phone] Fax:  [phone] Test Date:  03/07/2019  Patient: Blake Peterson DOB: 1941/09/21 Physician: Narda Amber, DO  Sex: Male Height: 5\' 9"  Ref Phys: Metta Clines, DO  ID#: [id number] Temp: 32.0C Technician:    Patient Complaints: This is a 78 year old man with polymyalgia rheumatica, diabetes mellitus, and tremor referred for evaluation of generalized weakness and pain.  NCV & EMG Findings: Extensive electrodiagnostic testing of the right upper and lower extremity shows:  1. Right median, ulnar, and sural sensory responses are within normal limits. 2. There is dual innervation to the right abductor pollicis brevis muscle as seen by a reduced right median motor response and presence of a motor response when stimulating at the ulnar-wrist and ulnar-elbow.  Right ulnar motor response shows reduced amplitude (6.1 mV) and decreased conduction velocity (A Elbow-B Elbow, 43 m/s).  Right peroneal motor response is reduced at the extensor digitorum brevis, and normal at the tibialis anterior.  Right tibial motor responses within normal limits.   3. Right tibial H reflex studies within normal limits.   4. Chronic motor axonal loss changes are seen in the abductor digit minimi, pronator teres, and triceps muscles.  There is no evidence of accompanied active denervation.   5. In the right lower extremity, there is no evidence of active or chronic motor axonal loss changes affecting any of the tested muscles.  Motor unit configuration and recruitment pattern is within normal limits.    Impression: 1. Right ulnar neuropathy with slowing across the elbow, mild-moderate in degree electrically. 2. 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 3. Incidentally, there is a right Riche-Cannieu anastomosis, a normal anatomic variant.  4. There is no evidence of a diffuse myopathy or sensorimotor  polyneuropathy affecting the right side.   ___________________________ Narda Amber, DO    Nerve Conduction Studies Anti Sensory Summary Table   Site NR Peak (ms) Norm Peak (ms) P-T Amp (V) Norm P-T Amp  Right Median Anti Sensory (2nd Digit)  32C  Wrist    3.2 <3.8 24.2 >10  Right Sural Anti Sensory (Lat Mall)  32C  Calf    2.5 <4.6 6.5 >3  Right Ulnar Anti Sensory (5th Digit)  32C  Wrist    3.1 <3.2 22.5 >5   Motor Summary Table   Site NR Onset (ms) Norm Onset (ms) O-P Amp (mV) Norm O-P Amp Site1 Site2 Delta-0 (ms) Dist (cm) Vel (m/s) Norm Vel (m/s)  Right Median Motor (Abd Poll Brev)  32C  Wrist    3.8 <4.0 3.2 >5 Elbow Wrist 5.6 28.0 50 >50  Elbow    9.4  2.4  Ulnar-wrist Elbow 6.0 0.0    Ulnar-wrist    3.4  3.5  Ulnar elbow Ulnar-wrist 5.0 0.0    Ulnar elbow    8.4  3.1         Right Peroneal Motor (Ext Dig Brev)  32C  Ankle    3.6 <6.0 2.1 >2.5 B Fib Ankle 7.5 37.0 49 >40  B Fib    11.1  1.8  Poplt B Fib 2.0 8.0 40 >40  Poplt    13.1  1.7         Right Peroneal TA Motor (Tib Ant)  32C  Fib Head    2.7 <4.5 4.9 >3 Poplit Fib Head 1.1 7.0 64 >40  Poplit    3.8  4.9  Right Tibial Motor (Abd Hall Brev)  32C  Ankle    1.3 <6.0 6.7 >4 Knee Ankle 11.0 45.0 41 >40  Knee    12.3  4.6         Right Ulnar Motor (Abd Dig Minimi)  32C  Wrist    2.6 <3.1 6.1 >7 B Elbow Wrist 4.2 23.0 55 >50  B Elbow    6.8  5.4  A Elbow B Elbow 2.3 10.0 43 >50  A Elbow    9.1  5.4          H Reflex Studies   NR H-Lat (ms) Lat Norm (ms) L-R H-Lat (ms)  Right Tibial (Gastroc)  32C     34.15 <35    EMG   Side Muscle Ins Act Fibs Psw Fasc Number Recrt Dur Dur. Amp Amp. Poly Poly. Comment  Right AntTibialis Nml Nml Nml Nml Nml Nml Nml Nml Nml Nml Nml Nml N/A  Right 1stDorInt Nml Nml Nml Nml Nml Nml Nml Nml Nml Nml Nml Nml N/A  Right PronatorTeres Nml Nml Nml Nml 1- Rapid Some 1+ Some 1+ Nml Nml N/A  Right Biceps Nml Nml Nml Nml Nml Nml Nml Nml Nml Nml Nml Nml N/A  Right  Triceps Nml Nml Nml Nml 1- Rapid Some 1+ Some 1+ Nml Nml N/A  Right Deltoid Nml Nml Nml Nml Nml Nml Nml Nml Nml Nml Nml Nml N/A  Right ABD Dig Min Nml Nml Nml Nml 1- Rapid Few 1+ Few 1+ Nml Nml N/A  Right Ext Indicis Nml Nml Nml Nml Nml Nml Nml Nml Nml Nml Nml Nml N/A  Right Gastroc Nml Nml Nml Nml Nml Nml Nml Nml Nml Nml Nml Nml N/A  Right RectFemoris Nml Nml Nml Nml Nml Nml Nml Nml Nml Nml Nml Nml N/A  Right GluteusMed Nml Nml Nml Nml Nml Nml Nml Nml Nml Nml Nml Nml N/A  Right BicepsFemS Nml Nml Nml Nml Nml Nml Nml Nml Nml Nml Nml Nml N/A  Right Cervical Parasp Low Nml Nml Nml Nml Nml Nml Nml Nml Nml Nml Nml Nml N/A  Right Lumbo Parasp Low Nml Nml Nml Nml Nml Nml Nml Nml Nml Nml Nml Nml N/A  Right FlexDigProf 4,5 Nml Nml Nml Nml Nml Nml Nml Nml Nml Nml Nml Nml N/A      Waveforms:

## 2019-03-07 NOTE — Telephone Encounter (Signed)
Spoke with patient.  Had nerve conduction study today. Acute worsening pain and swelling of legs today. Requests pain medication short course.  rec tylenol 500mg  BID to TID scheduled with meals. If breakthrough pain, may try short tramadol pain.

## 2019-03-07 NOTE — Telephone Encounter (Signed)
Returned pt's call.  States he is having allover pain but seems to be worse in left side of body- from shoulder to the foot.  Keeping pt awake. Requesting something for the pain.  Says he saw neuro but is still waiting on results of that visit.

## 2019-03-07 NOTE — Telephone Encounter (Signed)
Patient's wife is requested a call back to discuss a pain patient is having.  Wife would not go into detail as to what type of pain he was experiencing.    C/B # [phone]

## 2019-03-08 ENCOUNTER — Telehealth: Payer: Self-pay

## 2019-03-08 NOTE — Telephone Encounter (Signed)
Called and advised Pt of EMG results.

## 2019-03-08 NOTE — Telephone Encounter (Signed)
-----   Message from Pieter Partridge, DO sent at 03/08/2019  7:42 AM EDT ----- Nerve study does not reveal any muscle disease that would be causing weakness.  Incidentally, it shows evidence that the nerve running along his right elbow is pinched.  Often this does not cause any symptoms but it may cause numbness in his hand if he is resting on his elbow.  If that is the case, then he should take precautions not to lean on his elbow.  I think the "weakness" is related to arthritis.

## 2019-03-10 MED ORDER — ACETAMINOPHEN 500 MG PO TABS: 500.0000 mg | ORAL_TABLET | Freq: Four times a day (QID) | ORAL | Status: DC | PRN

## 2019-03-10 NOTE — Telephone Encounter (Signed)
Patient's wife requesting call back. In regards to the patient pain.   C/B # [phone]

## 2019-03-10 NOTE — Telephone Encounter (Signed)
[place] [facility] - Client Nonclinical Telephone Record [facility] Client [place] Night - Client Client Site [place] Physician Ria Bush - MD Contact Type Call Who Is Calling Patient / Member / Family / Caregiver Caller Name Blake Peterson Caller Phone Number [phone] Patient Name Blake Peterson Patient DOB December 07, 1940 Call Type Message Only Information Provided Reason for Call Request for General Office Information Initial Comment Caller states he would like a call back. Additional Comment Call Closed By: Lezlie Octave Transaction Date/Time: 03/10/2019 7:58:25 AM (ET)

## 2019-03-10 NOTE — Telephone Encounter (Signed)
I spoke with pt and pt continuing pain in feet and both shoulders; worse when pt goes to bed at night. Swelling in feet is some better; swelling goes down overnight with feet up.  Pt will try to keep feet elevated when sitting. Pt said when talking with Dr Darnell Level that Dr Darnell Level mentioned if needed stronger pain med to let him know.Pt wants to know if can get stronger pain med so can sleep at night. [facility].

## 2019-03-10 NOTE — Addendum Note (Signed)
Addended by: Ria Bush on: 03/10/2019 05:46 PM   Modules accepted: Orders

## 2019-03-10 NOTE — Telephone Encounter (Addendum)
Spoke with patient. Tramadol not as helpful as desired. Tylenol was actually more helpful. Advised may increase tylenol 500mg  to QID AC and HS, continue tramadol PRN breakthrough pain Update with effect. If ongoing pain, rec touch base with rheum.

## 2019-03-27 ENCOUNTER — Telehealth: Payer: Self-pay

## 2019-03-27 ENCOUNTER — Ambulatory Visit: Payer: PPO | Attending: Internal Medicine

## 2019-03-27 ENCOUNTER — Other Ambulatory Visit: Payer: Self-pay

## 2019-03-27 ENCOUNTER — Other Ambulatory Visit: Payer: Self-pay | Admitting: Family Medicine

## 2019-03-27 DIAGNOSIS — M6281 Muscle weakness (generalized): Secondary | ICD-10-CM | POA: Diagnosis not present

## 2019-03-27 DIAGNOSIS — E785 Hyperlipidemia, unspecified: Secondary | ICD-10-CM

## 2019-03-27 DIAGNOSIS — M25541 Pain in joints of right hand: Secondary | ICD-10-CM | POA: Insufficient documentation

## 2019-03-27 DIAGNOSIS — R2681 Unsteadiness on feet: Secondary | ICD-10-CM | POA: Diagnosis not present

## 2019-03-27 DIAGNOSIS — E559 Vitamin D deficiency, unspecified: Secondary | ICD-10-CM

## 2019-03-27 DIAGNOSIS — E119 Type 2 diabetes mellitus without complications: Secondary | ICD-10-CM

## 2019-03-27 DIAGNOSIS — R972 Elevated prostate specific antigen [PSA]: Secondary | ICD-10-CM

## 2019-03-27 DIAGNOSIS — M0579 Rheumatoid arthritis with rheumatoid factor of multiple sites without organ or systems involvement: Secondary | ICD-10-CM

## 2019-03-27 DIAGNOSIS — R278 Other lack of coordination: Secondary | ICD-10-CM | POA: Insufficient documentation

## 2019-03-27 DIAGNOSIS — M25542 Pain in joints of left hand: Secondary | ICD-10-CM | POA: Insufficient documentation

## 2019-03-27 NOTE — Telephone Encounter (Signed)
Left detailed VM w COVID screen and back door lab info   

## 2019-03-27 NOTE — Therapy (Signed)
[place] MAIN [facility] SERVICES [address] [place], [place], [address] Phone: [phone]   Fax:  [phone]  Physical Therapy Evaluation  Patient Details  Name: Blake Peterson MRN: [id number] Date of Birth: 07-18-1941 Referring Provider (PT): Dr. Annalee Genta   Encounter Date: 03/27/2019  PT End of Session - 03/29/19 0837    Visit Number  1    Number of Visits  25    Date for PT Re-Evaluation  06/19/19    Authorization Type  eval 03/27/19    PT Start Time  0930    PT Stop Time  1030    PT Time Calculation (min)  60 min    Equipment Utilized During Treatment  Gait belt    Activity Tolerance  Patient tolerated treatment well    Behavior During Therapy  [facility] for tasks assessed/performed       Past Medical History:  Diagnosis Date  . Basal cell carcinoma 12/2016   R anterior tibia  . Cataract   . Diverticulosis of colon   . History of colonic polyps   . HLD (hyperlipidemia)   . HTN (hypertension)   . Hypertensive retinopathy of both eyes, grade 1    [name]  . 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 Prediabetes   . 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 CATARACT EXTRACTION, BILATERAL  06/2017  . COLONOSCOPY  11/2012   mild-mod diverticulosis, int hem, rec rpt 5 yrs [facility] Plan)  . COLONOSCOPY  01/2018   2 TA, mod diverticulosis, rpt 5 yrs [facility] Plan)  . POLYPECTOMY    . UMBILICAL HERNIA REPAIR  01/07/04    There were no vitals filed for this visit.   Subjective Assessment - 03/29/19 0830    Subjective  Poor balance and multi-joint pain    Pertinent History  Pt reports issues with generalized pain in multiple joints of the body including ankles, knees, hips, and hands. Pt reports that he had plain film radiographs of his shoulders and feet. 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 Pt states that he has started having difficulty getting out of bed due to  weakness. He has been struggling with is balance as well. He started using a quad cane for ambulation to assist with his balance Pt reports that he has a fear of falling but denies any falls in the last 12 years. Numbness on the back of LLE for multiple months. Pt reports numbness in the bottom of both feet.    Patient Stated Goals  Improve balance    Currently in Pain?  Yes    Pain Score  6     Pain Location  Knee    Pain Orientation  Left    Pain Descriptors / Indicators  Squeezing    Pain Type  Chronic pain    Pain Onset  More than a month ago         [facility] PT Assessment - 03/29/19 0001      Assessment   Medical Diagnosis  Polyarthralgia    Referring Provider (PT)  Dr. Annalee Genta    Onset Date/Surgical Date  11/20/18   Approximate   Hand Dominance  Right    Next MD Visit  04/03/19    Prior Therapy  None      Precautions   Precautions  Fall      Restrictions   Weight Bearing Restrictions  No  Balance Screen   Has the patient fallen in the past 6 months  No    Has the patient had a decrease in activity level because of a fear of falling?   Yes    Is the patient reluctant to leave their home because of a fear of falling?   No      Home Film/video editor residence    Living Arrangements  Spouse/significant other    Available Help at Discharge  Family    Type of [place] to enter    Entrance Stairs-Number of Steps  3    [place]  One level    [place] - 2 wheels;Cane - quad;Shower seat      Prior Function   Level of Independence  Independent with basic ADLs;Needs assistance with homemaking    Vocation  Retired    Biomedical scientist  Retired Event organiser    Leisure  Used to play golf, reading      Cognition   Overall Cognitive Status  Impaired/Different from baseline    Area of Impairment  Attention      Observation/Other Assessments   Other Surveys    Activities of Balance and Confidence Scale    Activities of Balance Confidence Scale (ABC Scale)   41.25%      Standardized Balance Assessment   Standardized Balance Assessment  Berg Balance Test;Five Times Sit to Stand;10 meter walk test;Timed Up and Go Test    Five times sit to stand comments   18.1s    10 Meter Walk  Self-selected: 16.5s = 0.61 m/s; Fastest: 10.0s = 1.0 m/s      Berg Balance Test   Sit to Stand  Able to stand without using hands and stabilize independently    Standing Unsupported  Able to stand safely 2 minutes    Sitting with Back Unsupported but Feet Supported on Floor or Stool  Able to sit safely and securely 2 minutes    Stand to Sit  Sits safely with minimal use of hands    Transfers  Able to transfer safely, definite need of hands    Standing Unsupported with Eyes Closed  Able to stand 10 seconds safely    Standing Unsupported with Feet Together  Able to place feet together independently and stand 1 minute safely    From Standing, Reach Forward with Outstretched Arm  Can reach forward >12 cm safely (5")    From Standing Position, Pick up Object from Floor  Unable to pick up and needs supervision    From Standing Position, Turn to Look Behind Over each Shoulder  Looks behind from both sides and weight shifts well    Turn 360 Degrees  Able to turn 360 degrees safely but slowly    Standing Unsupported, Alternately Place Feet on Step/Stool  Able to stand independently and safely and complete 8 steps in 20 seconds    Standing Unsupported, One Foot in Front  Able to plae foot ahead of the other independently and hold 30 seconds    Standing on One Leg  Tries to lift leg/unable to hold 3 seconds but remains standing independently    Total Score  45      Timed Up and Go Test   TUG  Normal TUG    Normal TUG (seconds)  15.6  SUBJECTIVE Chief complaint: Pt reports issues with generalized pain in multiple joints of the body including ankles, knees, hips, and  hands. Pt reports that he had plain film radiographs of his shoulders and feet. 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 Pt states that he has started having difficulty getting out of bed due to weakness. He has been struggling with is balance as well. He started using a quad cane for ambulation to assist with his balance Pt reports that he has a fear of falling but denies any falls in the last 12 years. Numbness on the back of LLE for multiple months. Pt reports numbness in the bottom of both feet.  Recent changes in overall health/medication: Started on methotrexate without any negative side effects Directional pattern for falls: forward loss of balance Prior history of physical therapy for balance: None Follow-up appointment with MD: July 13th with PCP for annual physical Red flags (bowel/bladder changes, saddle paresthesia, personal history of cancer, chills/fever, night sweats, unrelenting pain) New onset loss of bladder control (PCP aware),   OBJECTIVE  MUSCULOSKELETAL: Tremor: Absent Bulk: Normal  Posture Forward head and rounded shoulders  Gait Decreased gait speed and step length  Strength R/L 4/4 Hip flexion 5/5 Knee extension 4+/4+ Knee flexion 4/4 Ankle Dorsiflexion Grip strength (average of 2): R: 21#; L: 7.5#   NEUROLOGICAL:  Mental Status Patient is oriented to person, place and time.  Recent memory is intact.  Remote memory is intact.  Attention span and concentration are mildly impaired during session Expressive speech is intact although pt occasionally slow to find correct words Patient's fund of knowledge is within normal limits for educational level.  Sensation Grossly intact to light touch bilateral UEs/LEs as determined by testing dermatomes C2-T2/L2-S2 respectively Proprioception and hot/cold testing deferred on this date  Reflexes Deferred  Coordination/Cerebellar Deferred  FUNCTIONAL OUTCOME MEASURES   Results  Comments  BERG 45/56 Fall risk, in need of intervention  TUG 15.6 seconds Fall risk, in need of intervention  5TSTS 18.1 seconds Fall risk, in need of intervention  10 Meter Gait Speed Self-selected: 16.5s = 0.61 m/s; Fastest: 10.0s = 1.0 m/s Below normative values for full community ambulation  ABC Scale 41.25% Fall risk, in need of intervention    POSTURAL CONTROL TESTS   Clinical Test of Sensory Interaction for Balance    (CTSIB):  CONDITION TIME STRATEGY SWAY  Eyes open, firm surface 30 seconds ankle 1+  Eyes closed, firm surface 30 seconds ankle 2+  Eyes open, foam surface 30 seconds ankle 3+  Eyes closed, foam surface 30 seconds ankle 3+, fell during first trial            Objective measurements completed on examination: See above findings.              PT Education - 03/29/19 0837    Education Details  Plan of care and HEP    Person(s) Educated  Patient    Methods  Explanation;Handout;Demonstration    Comprehension  Verbalized understanding       PT Short Term Goals - 03/29/19 1235      PT SHORT TERM GOAL #1   Title  Pt will be independent with HEP in order to improve strength and balance in order to decrease fall risk and improve function at home.    Time  6    Period  Weeks    Status  New    Target Date  05/08/19  PT Long Term Goals - 03/29/19 1235      PT LONG TERM GOAL #1   Title  Pt will improve BERG by at least 3 points in order to demonstrate clinically significant improvement in balance.    Baseline  03/27/19: 45/56    Time  12    Period  Weeks    Status  New    Target Date  06/21/19      PT LONG TERM GOAL #2   Title  Pt will decrease TUG to below 14 seconds/decrease in order to demonstrate decreased fall risk.    Baseline  03/27/19: 15.6s    Time  12    Period  Weeks    Status  New    Target Date  06/21/19      PT LONG TERM GOAL #3   Title  Pt will improve ABC by at least 13% in order to demonstrate clinically  significant improvement in balance confidence.    Baseline  03/27/19: 41.25%    Time  12    Period  Weeks    Status  New    Target Date  06/19/19      PT LONG TERM GOAL #4   Title  Pt will decrease 5TSTS by at least 3 seconds in order to demonstrate clinically significant improvement in LE strength.    Baseline  03/27/19: 18.1s    Time  12    Period  Weeks    Status  New    Target Date  06/19/19      PT LONG TERM GOAL #5   Title  Pt will increase self-selected 10MWT by at least 0.13 m/s in order to demonstrate clinically significant improvement in community ambulation.    Baseline  03/27/19: Self-selected: 16.5s = 0.61 m/s; Fastest: 10.0s = 1.0 m/s    Time  12    Period  Weeks    Status  New    Target Date  06/19/19             Plan - 03/29/19 0830    Clinical Impression Statement  Pt is a pleasant 78 year-old male referred for polyarthralgia. Pt complains of generalized joint pain as well as difficulty with balance. PT examination focused on balance and strength assessment. Pt presents as an elevated fall risk with BERG of 45/56, TUG 15.6s, 5TSTS 18.1s, and decreased self-selected gait speed of 0.61 m/s. He has low balance confidence with ABC of 41.25%. Pt presents with deficits in strength, gait and balance. He will benefit from skilled PT services to address deficits in balance, decrease risk for future falls, and improve his function at home.    Personal Factors and Comorbidities  Comorbidity 1;Age;Comorbidity 2    Comorbidities  HTN, polyarthralgia    Examination-Activity Limitations  Locomotion Level;Squat;Stairs;Transfers    Examination-Participation Restrictions  Cleaning;Community Activity;Laundry;Shop;Yard Work    Merchant navy officer  Evolving/Moderate complexity    Clinical Decision Making  Moderate    Rehab Potential  Good    PT Frequency  2x / week    PT Duration  12 weeks    PT Treatment/Interventions  ADLs/Self Care Home Management;Aquatic  Therapy;Biofeedback;Canalith Repostioning;Cryotherapy;Electrical Stimulation;Moist Heat;Traction;Ultrasound;DME Instruction;Gait training    PT Next Visit Plan  Review HEP and progress as necessary/appropriate, balance and strength exercises    PT Home Exercise Plan  Medbridge Access Code: [email or web address]    Consulted and Agree with Plan of Care  Patient       Patient will benefit from skilled  therapeutic intervention in order to improve the following deficits and impairments:  Abnormal gait, Decreased balance, Decreased endurance, Decreased mobility, Decreased strength, Difficulty walking  Visit Diagnosis: 1. Unsteadiness on feet   2. Muscle weakness (generalized)        Problem List Patient Active Problem List   Diagnosis Date Noted  . Weakness of both lower extremities 02/24/2019  . Low energy 02/24/2019  . Rheumatoid arthritis ([place]) 01/04/2019  . High risk medication use 01/04/2019  . 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 Current chronic use of systemic steroids 12/21/2018  . Lower extremity edema 12/21/2018  . 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 BPH (benign prostatic hyperplasia) 12/13/2018  . 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 Pedal edema 05/10/2018  . 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 Left foot pain 11/15/2015  . 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 Hypertensive retinopathy of both eyes, grade 1   . Health maintenance examination 08/10/2014  . Advanced care planning/counseling discussion 08/10/2014  . Bilateral hearing loss due to cerumen impaction 08/10/2014  . Tremor 09/09/2012  . Medicare annual wellness visit, subsequent 08/07/2011  . History of colonic polyps   . Diverticulosis of colon   . HTN (hypertension)   . HLD (hyperlipidemia)   . Controlled type 2 diabetes mellitus without complication, without long-term current use of insulin ([place]) 08/11/2010  . Vitamin D deficiency 08/07/2010  . 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 Elevated PSA 02/16/2007   Phillips Grout PT, DPT, [facility] Aly Hauser 03/29/2019, 12:42 PM  [place] [facility] SERVICES [address] [place], [place], [address] Phone: [phone]   Fax:  [phone]  Name: Blake Peterson MRN: [id number] Date of Birth: [birth year]

## 2019-03-27 NOTE — Patient Instructions (Signed)
Access Code: [facility]  URL: [email or web address]  Date: 03/27/2019  Prepared by: Roxana Hires   Exercises Standing [place] to 1/2 Tandem Stance - 2 reps - 30s x 2 with each foot forward hold - 2x daily - 7x weekly Sit to Stand without Arm Support - 10 reps - 2 sets - 1x daily - 7x weekly

## 2019-03-28 ENCOUNTER — Other Ambulatory Visit (INDEPENDENT_AMBULATORY_CARE_PROVIDER_SITE_OTHER): Payer: PPO

## 2019-03-28 DIAGNOSIS — R972 Elevated prostate specific antigen [PSA]: Secondary | ICD-10-CM

## 2019-03-28 DIAGNOSIS — E785 Hyperlipidemia, unspecified: Secondary | ICD-10-CM

## 2019-03-28 DIAGNOSIS — E559 Vitamin D deficiency, unspecified: Secondary | ICD-10-CM

## 2019-03-28 LAB — BASIC METABOLIC PANEL
BUN: 19 mg/dL (ref 6–23)
CO2: 28 mEq/L (ref 19–32)
Calcium: 9.7 mg/dL (ref 8.4–10.5)
Chloride: 93 mEq/L — ABNORMAL LOW (ref 96–112)
Creatinine, Ser: 0.77 mg/dL (ref 0.40–1.50)
GFR: 98.11 mL/min (ref >60.00)
Glucose, Bld: 102 mg/dL — ABNORMAL HIGH (ref 70–99)
Potassium: 4.1 mEq/L (ref 3.5–5.1)
Sodium: 132 mEq/L — ABNORMAL LOW (ref 135–145)

## 2019-03-28 LAB — LIPID PANEL
Cholesterol: 119 mg/dL (ref 0–200)
HDL: 44.3 mg/dL (ref >39.00)
LDL Cholesterol: 50 mg/dL (ref 0–99)
NonHDL: 74.24
Total CHOL/HDL Ratio: 3
Triglycerides: 122 mg/dL (ref 0.0–149.0)
VLDL: 24.4 mg/dL (ref 0.0–40.0)

## 2019-03-28 LAB — VITAMIN D 25 HYDROXY (VIT D DEFICIENCY, FRACTURES): VITD: 34.67 ng/mL (ref 30.00–100.00)

## 2019-03-29 LAB — REFLEX PSA, FREE
PSA, % Free: 13 % (calc) — ABNORMAL LOW (ref >25)
PSA, Free: 0.6 ng/mL

## 2019-03-29 LAB — PSA, TOTAL WITH REFLEX TO PSA, FREE: PSA, Total: 4.5 ng/mL — ABNORMAL HIGH (ref <=4.0)

## 2019-04-03 ENCOUNTER — Other Ambulatory Visit: Payer: Self-pay

## 2019-04-03 ENCOUNTER — Ambulatory Visit: Payer: PPO

## 2019-04-03 DIAGNOSIS — R2681 Unsteadiness on feet: Secondary | ICD-10-CM | POA: Diagnosis not present

## 2019-04-03 DIAGNOSIS — M6281 Muscle weakness (generalized): Secondary | ICD-10-CM

## 2019-04-03 NOTE — Therapy (Signed)
[place] MAIN [facility] SERVICES [address] [place], [place], [address] Phone: [phone]   Fax:  [phone]  Physical Therapy Treatment  Patient Details  Name: Blake Peterson MRN: [id number] Date of Birth: 01-Apr-1941 Referring Provider (PT): Dr. Annalee Genta   Encounter Date: 04/03/2019  PT End of Session - 04/03/19 1127    Visit Number  2    Number of Visits  25    Date for PT Re-Evaluation  06/19/19    Authorization Type  eval 03/27/19    PT Start Time  0928    PT Stop Time  1015    PT Time Calculation (min)  47 min    Equipment Utilized During Treatment  Gait belt    Activity Tolerance  Patient tolerated treatment well    Behavior During Therapy  [facility] for tasks assessed/performed       Past Medical History:  Diagnosis Date  . Basal cell carcinoma 12/2016   R anterior tibia  . Cataract   . Diverticulosis of colon   . History of colonic polyps   . HLD (hyperlipidemia)   . HTN (hypertension)   . Hypertensive retinopathy of both eyes, grade 1    [name]  . 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 Prediabetes   . 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 CATARACT EXTRACTION, BILATERAL  06/2017  . COLONOSCOPY  11/2012   mild-mod diverticulosis, int hem, rec rpt 5 yrs [facility] Plan)  . COLONOSCOPY  01/2018   2 TA, mod diverticulosis, rpt 5 yrs Fuller Plan)  . POLYPECTOMY    . UMBILICAL HERNIA REPAIR  01/07/04    There were no vitals filed for this visit.  Subjective Assessment - 04/03/19 1123    Subjective  Pt reports that he is doing well today.  He reports mild soreness after last session that lasted about a day.  He reports mild irritation in his L hamstring that is worse with prolonged sitting (5/10).  Pt states that he has been complaint with his HEP this week. No new questions or comments at this time.    Pertinent History  Pt reports issues with generalized pain in multiple joints of the  body including ankles, knees, hips, and hands. Pt reports that he had plain film radiographs of his shoulders and feet. 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 Pt states that he has started having difficulty getting out of bed due to weakness. He has been struggling with is balance as well. He started using a quad cane for ambulation to assist with his balance Pt reports that he has a fear of falling but denies any falls in the last 12 years. Numbness on the back of LLE for multiple months. Pt reports numbness in the bottom of both feet.    Patient Stated Goals  Improve balance    Currently in Pain?  Yes    Pain Score  5     Pain Location  Leg    Pain Orientation  Posterior;Upper    Pain Descriptors / Indicators  Cramping;Discomfort    Pain Type  Chronic pain    Pain Onset  More than a month ago    Pain Frequency  Intermittent    Aggravating Factors   prolonged sitting        Treatment   There-Ex  Nustep - 5 minutes Level 2 (3 minutes untimed) Marching in //  bars 1x10 bilaterally without weight; 2x10 bilaterally with 2# cuff weights; intermittent UE support for steadying Standing hip abduction 2x10 in // bars.  Cueing needed for proper technique STSs without arm support 2x5  Neuromuscular Re-education Tandem stance with small step between heel/toe in // bars, completed with both R and L foot in front  2x30 secs each.  Intermittent UE support pushing against // bars DL stance on foam with feet together, eyes closed, and horizontal head turns 2x30 seconds. DL stance on foam with feet together, eyes open, and vertical head nods 2x30 seconds Tandem stance forward walking in // bars x8 passes.  Intermittent UE support for steadying  SBA to CGA throughout for steadying for all neuromuscular re-education interventions    Pt demonstrates excellent motivation throughout today's session.  He demonstrates proper technique with his HEP exercises and will continue  working on them until next session.  Pt demonstrates unsteadiness with horizontal and vertical head turns while standing on foam with eyes closed, requiring intermittent UE support against the // bars for steadying, and cueing to not lean against the bars for support.  With tandem walking, pt was cued to stand erect and to avoid pushing his RUE into the side of the // bars for support.  Without UE support, pt demonstrates an appropriate level of unsteadiness, requiring CGA for steadying intermittently.  With standing hip abduction, pt requiring moderate cueing for technique with a focus on keeping his toes pointed forward and avoiding bringing his leg into hip flexion, demonstrating weak glut meds.  Pt educated throughout session about proper posture and technique with exercises. Improved exercise technique, movement at target joints, use of target muscles after min to mod verbal, visual, tactile cues.  Pt will benefit from continued skilled PT services to improve his static and dynamic balance, as well as improving his strength  and technique.         PT Education - 04/03/19 1230    Education Details  Reviewed HEP; educated on proper technique and how to progress his 1/2 tandem stance HEP  exercise    Person(s) Educated  Patient    Methods  Explanation;Demonstration;Verbal cues    Comprehension  Verbalized understanding;Returned demonstration       PT Short Term Goals - 03/29/19 1235      PT SHORT TERM GOAL #1   Title  Pt will be independent with HEP in order to improve strength and balance in order to decrease fall risk and improve function at home.    Time  6    Period  Weeks    Status  New    Target Date  05/08/19        PT Long Term Goals - 03/29/19 1235      PT LONG TERM GOAL #1   Title  Pt will improve BERG by at least 3 points in order to demonstrate clinically significant improvement in balance.    Baseline  03/27/19: 45/56    Time  12    Period  Weeks    Status  New     Target Date  06/21/19      PT LONG TERM GOAL #2   Title  Pt will decrease TUG to below 14 seconds/decrease in order to demonstrate decreased fall risk.    Baseline  03/27/19: 15.6s    Time  12    Period  Weeks    Status  New    Target Date  06/21/19      PT  LONG TERM GOAL #3   Title  Pt will improve ABC by at least 13% in order to demonstrate clinically significant improvement in balance confidence.    Baseline  03/27/19: 41.25%    Time  12    Period  Weeks    Status  New    Target Date  06/19/19      PT LONG TERM GOAL #4   Title  Pt will decrease 5TSTS by at least 3 seconds in order to demonstrate clinically significant improvement in LE strength.    Baseline  03/27/19: 18.1s    Time  12    Period  Weeks    Status  New    Target Date  06/19/19      PT LONG TERM GOAL #5   Title  Pt will increase self-selected 10MWT by at least 0.13 m/s in order to demonstrate clinically significant improvement in community ambulation.    Baseline  03/27/19: Self-selected: 16.5s = 0.61 m/s; Fastest: 10.0s = 1.0 m/s    Time  12    Period  Weeks    Status  New    Target Date  06/19/19            Plan - 04/03/19 1148    Clinical Impression Statement  Pt demonstrates excellent motivation throughout today's session.  He demonstrates proper technique with his HEP exercises and will continue working on them until next session.  Pt demonstrates unsteadiness with horizontal and vertical head turns while standing on foam with eyes closed, requiring intermittent UE support against the // bars for steadying, and cueing to not lean against the bars for support.  With tandem walking, pt was cued to stand erect and to avoid pushing his RUE into the side of the // bars for support.  Without UE support, pt demonstrates an appropriate level of unsteadiness, requiring CGA for steadying intermittently.  With standing hip abduction, pt requiring moderate cueing for technique with a focus on keeping his toes pointed  forward and avoiding bringing his leg into hip flexion, demonstrating weak glut meds.  Pt educated throughout session about proper posture and technique with exercises. Improved exercise technique, movement at target joints, use of target muscles after min to mod verbal, visual, tactile cues.  Pt will benefit from continued skilled PT services to improve his static and dynamic balance, as well as improving his strength  and technique.    Personal Factors and Comorbidities  Comorbidity 1;Age;Comorbidity 2    Comorbidities  HTN, polyarthralgia    Examination-Activity Limitations  Locomotion Level;Squat;Stairs;Transfers    Examination-Participation Restrictions  Cleaning;Community Activity;Laundry;Shop;Yard Work    [employer]  Evolving/Moderate complexity    Rehab Potential  Good    PT Frequency  2x / week    PT Duration  12 weeks    PT Treatment/Interventions  ADLs/Self Care Home Management;Aquatic Therapy;Biofeedback;Canalith Repostioning;Cryotherapy;Electrical Stimulation;Moist Heat;Traction;Ultrasound;DME Instruction;Gait training    PT Next Visit Plan  Review HEP and progress as necessary/appropriate, balance and strength exercises    PT Home Exercise Plan  Medbridge Access Code: [email or web address]    Consulted and Agree with Plan of Care  Patient       Patient will benefit from skilled therapeutic intervention in order to improve the following deficits and impairments:  Abnormal gait, Decreased balance, Decreased endurance, Decreased mobility, Decreased strength, Difficulty walking  Visit Diagnosis: 1. Unsteadiness on feet   2. Muscle weakness (generalized)        Problem List Patient Active Problem List  Diagnosis Date Noted  . Weakness of both lower extremities 02/24/2019  . Low energy 02/24/2019  . Rheumatoid arthritis ([place]) 01/04/2019  . High risk medication use 01/04/2019  . Seropositive rheumatoid arthritis ([place]) 01/04/2019  . Current chronic use of systemic  steroids 12/21/2018  . Lower extremity edema 12/21/2018  . 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 BPH (benign prostatic hyperplasia) 12/13/2018  . 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 Pedal edema 05/10/2018  . 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 Left foot pain 11/15/2015  . 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 Hypertensive retinopathy of both eyes, grade 1   . Health maintenance examination 08/10/2014  . Advanced care planning/counseling discussion 08/10/2014  . Bilateral hearing loss due to cerumen impaction 08/10/2014  . Tremor 09/09/2012  . Medicare annual wellness visit, subsequent 08/07/2011  . History of colonic polyps   . Diverticulosis of colon   . HTN (hypertension)   . HLD (hyperlipidemia)   . Controlled type 2 diabetes mellitus without complication, without long-term current use of insulin ([place]) 08/11/2010  . Vitamin D deficiency 08/07/2010  . 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 Elevated PSA 02/16/2007    Lutricia Horsfall, SPT   Lutricia Horsfall 04/03/2019, 12:41 PM   Lieutenant Diego PT, DPT 12:42 PM,04/03/19 [id number]  This entire session was performed under direct supervision and direction of a licensed therapist/therapist assistant . I have personally read, edited and approve of the note as written.   [place] MAIN [facility] SERVICES [address] [place], [place], [address] Phone: [phone]   Fax:  [phone]  Name: JAE SKEET MRN: [id number] Date of Birth: [birth year]

## 2019-04-04 ENCOUNTER — Encounter: Payer: Self-pay | Admitting: Family Medicine

## 2019-04-04 ENCOUNTER — Telehealth (INDEPENDENT_AMBULATORY_CARE_PROVIDER_SITE_OTHER): Payer: PPO | Admitting: Family Medicine

## 2019-04-04 VITALS — BP 124/77 | HR 70 | Temp 98.6°F | Ht 68.5 in | Wt 209.0 lb

## 2019-04-04 DIAGNOSIS — M059 Rheumatoid arthritis with rheumatoid factor, unspecified: Secondary | ICD-10-CM | POA: Diagnosis not present

## 2019-04-04 DIAGNOSIS — E119 Type 2 diabetes mellitus without complications: Secondary | ICD-10-CM

## 2019-04-04 DIAGNOSIS — R29898 Other symptoms and signs involving the musculoskeletal system: Secondary | ICD-10-CM

## 2019-04-04 DIAGNOSIS — I10 Essential (primary) hypertension: Secondary | ICD-10-CM | POA: Diagnosis not present

## 2019-04-04 DIAGNOSIS — M0579 Rheumatoid arthritis with rheumatoid factor of multiple sites without organ or systems involvement: Secondary | ICD-10-CM | POA: Diagnosis not present

## 2019-04-04 DIAGNOSIS — G250 Essential tremor: Secondary | ICD-10-CM | POA: Diagnosis not present

## 2019-04-04 DIAGNOSIS — E559 Vitamin D deficiency, unspecified: Secondary | ICD-10-CM

## 2019-04-04 DIAGNOSIS — R972 Elevated prostate specific antigen [PSA]: Secondary | ICD-10-CM

## 2019-04-04 NOTE — Progress Notes (Signed)
Virtual visit attempted through MyChart, then completed through Doxy.Me. Due to national recommendations of social distancing due to COVID-19, a virtual visit is felt to be most appropriate for this patient at this time. Reviewed limitations of a virtual visit.   Patient location: home, son and wife also present on call  Provider location: [employer] at [facility], office If any vitals were documented, they were collected by patient at home unless specified below.   BP 124/77   Pulse 70   Temp 98.6 F (37 C)   Ht 5' 8.5" (1.74 m)   Wt 209 lb (94.8 kg)   BMI 31.32 kg/m    CC: 3 mo f/u visit  Subjective:    Patient ID: Blake Peterson, male    DOB: [birth year], 78 y.o.   MRN: [id number]  HPI: Blake Peterson is a 78 y.o. male presenting on 04/04/2019 for Follow-up (3 mo f/u.)   Ongoing chronic pain from rheumatoid arthritis, prior thought PMR - started on low dose prednisone by rheum as well as aleve and continued MTX. PT/OT has been started as well. Tramadol wasn't very helpful, but he did find tylenol beneficial.  Saw neurology for ongoing weakness - found to have no muscle disease by NCS/EMG Loretta Plume), incidental R ulnar neuropathy as well as mild chronic C7 radiculopathy. Tremor thought benign essential tremor - without other symptoms suggestive of parkinson's disease.   Leg swelling has significantly improved.   DM - fasting cbg 122 this morning. Normally better than this. Checks regularly.   Noticing increased tightness to L hamstring "cramp" worse when sitting in car or at night time - feels numbness at times of posterior leg into calf. Better when supine.      Relevant past medical, surgical, family and social history reviewed and updated as indicated. Interim medical history since our last visit reviewed. Allergies and medications reviewed and updated. Outpatient Medications Prior to Visit  Medication Sig Dispense Refill  . acetaminophen (TYLENOL) 500 MG tablet Take 1  tablet (500 mg total) by mouth every 6 (six) hours as needed for moderate pain.    Marland Kitchen aspirin 81 MG tablet Take 81 mg by mouth daily.      Marland Kitchen atenolol (TENORMIN) 50 MG tablet TAKE 1 TABLET (50 MG TOTAL) BY MOUTH DAILY. 90 tablet 3  . atorvastatin (LIPITOR) 80 MG tablet TAKE 1 TABLET (80 MG TOTAL) BY MOUTH AT BEDTIME. 90 tablet 3  . chlorthalidone (HYGROTON) 25 MG tablet TAKE 1 TABLET (25 MG TOTAL) BY MOUTH DAILY. 90 tablet 3  . cholecalciferol (VITAMIN D) 1000 UNITS tablet Take 1,000 Units by mouth daily.      . enalapril (VASOTEC) 10 MG tablet TAKE 1 TABLET BY MOUTH TWICE A DAY 180 tablet 0  . glimepiride (AMARYL) 1 MG tablet TAKE 1 TABLET (1 MG TOTAL) BY MOUTH DAILY WITH BREAKFAST. 90 tablet 1  . KLOR-CON M10 10 MEQ tablet TAKE 1 TABLET BY MOUTH EVERY DAY 90 tablet 1  . methotrexate (RHEUMATREX) 2.5 MG tablet 7 tabs (17.5 mg ) weekly x  12 weeks. #84 tabs    . sildenafil (VIAGRA) 100 MG tablet Take 100 mg by mouth daily as needed.       No facility-administered medications prior to visit.      Per HPI unless specifically indicated in ROS section below Review of Systems Objective:    BP 124/77   Pulse 70   Temp 98.6 F (37 C)   Ht 5' 8.5" (1.74 m)  Wt 209 lb (94.8 kg)   BMI 31.32 kg/m   Wt Readings from Last 3 Encounters:  04/04/19 209 lb (94.8 kg)  03/03/19 225 lb (102.1 kg)  02/23/19 227 lb 5 oz (103.1 kg)     Physical exam: Gen: alert, NAD, not ill appearing Pulm: speaks in complete sentences without increased work of breathing Psych: normal mood, normal thought content      Results for orders placed or performed in visit on 03/47/42  Basic metabolic panel  Result Value Ref Range   Sodium 132 (L) 135 - 145 mEq/L   Potassium 4.1 3.5 - 5.1 mEq/L   Chloride 93 (L) 96 - 112 mEq/L   CO2 28 19 - 32 mEq/L   Glucose, Bld 102 (H) 70 - 99 mg/dL   BUN 19 6 - 23 mg/dL   Creatinine, Ser 0.77 0.40 - 1.50 mg/dL   Calcium 9.7 8.4 - 10.5 mg/dL   GFR 98.11 >60.00 mL/min  PSA,  Total with Reflex to PSA, Free  Result Value Ref Range   PSA, Total 4.5 (H) < OR = 4.0 ng/mL  Lipid panel  Result Value Ref Range   Cholesterol 119 0 - 200 mg/dL   Triglycerides 122.0 0.0 - 149.0 mg/dL   HDL 44.30 >39.00 mg/dL   VLDL 24.4 0.0 - 40.0 mg/dL   LDL Cholesterol 50 0 - 99 mg/dL   Total CHOL/HDL Ratio 3    NonHDL 74.24   VITAMIN D 25 Hydroxy (Vit-D Deficiency, Fractures)  Result Value Ref Range   VITD 34.67 30.00 - 100.00 ng/mL  reflex PSA, Free  Result Value Ref Range   PSA, Free 0.6 ng/mL   PSA, % Free 13 (L) >25 % (calc)   Lab Results  Component Value Date   HGBA1C 7.4 (H) 02/23/2019    Assessment & Plan:   Problem List Items Addressed This Visit    Weakness of both lower extremities    S/p reassuring NCS/EMG and neuro eval - thought related to RA.       Vitamin D deficiency    Levels repleted with daily vit D.       Seropositive rheumatoid arthritis ([place])    Appreciate rheum care. Continue MTX and low dose prednisone.       HTN (hypertension)    Chronic, stable. Continue current regimen.       Essential tremor    Thought benign essential tremor by neuro eval 02/2019      Elevated PSA    PSA levels trending down. Endorses nocturia 2-3x/night. Anticipate BPH related. fmhx prostate cancer. Recheck levels in 6 months with DRE.       Controlled type 2 diabetes mellitus without complication, without long-term current use of insulin (HCC) - Primary    A1c 7's%. He endorses good control based on recall cbg's. Continue glimepiride 1mg  daily. Update A1c next labs.           No orders of the defined types were placed in this encounter.  No orders of the defined types were placed in this encounter.   I discussed the assessment and treatment plan with the patient. The patient was provided an opportunity to ask questions and all were answered. The patient agreed with the plan and demonstrated an understanding of the instructions. The patient was advised  to call back or seek an in-person evaluation if the symptoms worsen or if the condition fails to improve as anticipated.  Follow up plan: Return in about 3 months (around  07/05/2019).  Ria Bush, MD

## 2019-04-04 NOTE — Assessment & Plan Note (Signed)
Chronic, stable. Continue current regimen. 

## 2019-04-04 NOTE — Assessment & Plan Note (Signed)
Appreciate rheum care. Continue MTX and low dose prednisone.

## 2019-04-04 NOTE — Assessment & Plan Note (Signed)
PSA levels trending down. Endorses nocturia 2-3x/night. Anticipate BPH related. fmhx prostate cancer. Recheck levels in 6 months with DRE.

## 2019-04-04 NOTE — Assessment & Plan Note (Signed)
Levels repleted with daily vit D.

## 2019-04-04 NOTE — Assessment & Plan Note (Signed)
Thought benign essential tremor by neuro eval 02/2019

## 2019-04-04 NOTE — Assessment & Plan Note (Signed)
S/p reassuring NCS/EMG and neuro eval - thought related to RA.

## 2019-04-04 NOTE — Assessment & Plan Note (Signed)
A1c 7's%. He endorses good control based on recall cbg's. Continue glimepiride 1mg  daily. Update A1c next labs.

## 2019-04-05 ENCOUNTER — Ambulatory Visit: Payer: PPO

## 2019-04-05 ENCOUNTER — Other Ambulatory Visit: Payer: Self-pay

## 2019-04-05 DIAGNOSIS — R2681 Unsteadiness on feet: Secondary | ICD-10-CM | POA: Diagnosis not present

## 2019-04-05 DIAGNOSIS — M6281 Muscle weakness (generalized): Secondary | ICD-10-CM

## 2019-04-05 NOTE — Therapy (Addendum)
[place] MAIN [facility] SERVICES [address] [place], [place], [address] Phone: [phone]   Fax:  [phone]  Physical Therapy Treatment  Patient Details  Name: Blake Peterson MRN: [id number] Date of Birth: 10-05-1940 Referring Provider (PT): Dr. Annalee Genta   Encounter Date: 04/05/2019  PT End of Session - 04/05/19 1032    Visit Number  3    Number of Visits  25    Date for PT Re-Evaluation  06/19/19    Authorization Type  eval 03/27/19    PT Start Time  0940    PT Stop Time  1020    PT Time Calculation (min)  40 min    Equipment Utilized During Treatment  Gait belt    Activity Tolerance  Patient tolerated treatment well    Behavior During Therapy  [facility] for tasks assessed/performed       Past Medical History:  Diagnosis Date  . Basal cell carcinoma 12/2016   R anterior tibia  . Cataract   . Diverticulosis of colon   . History of colonic polyps   . HLD (hyperlipidemia)   . HTN (hypertension)   . Hypertensive retinopathy of both eyes, grade 1    [name]  . 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 Prediabetes   . 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 CATARACT EXTRACTION, BILATERAL  06/2017  . COLONOSCOPY  11/2012   mild-mod diverticulosis, int hem, rec rpt 5 yrs Fuller Plan)  . COLONOSCOPY  01/2018   2 TA, mod diverticulosis, rpt 5 yrs Fuller Plan)  . POLYPECTOMY    . UMBILICAL HERNIA REPAIR  01/07/04    There were no vitals filed for this visit.  Subjective Assessment - 04/05/19 0945    Subjective  Pt reports that he is doing well today.  He reports mild soreness after last session, but is not sore today.  He reports mild irritation in his L hamstring that is worse with prolonged sitting.  He reports that he is starting OT next week.  Pt states that he has been compliant with his HEP. No new questions or comments at this time.    Pertinent History  Pt reports issues with generalized pain  in multiple joints of the body including ankles, knees, hips, and hands. Pt reports that he had plain film radiographs of his shoulders and feet. 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 Pt states that he has started having difficulty getting out of bed due to weakness. He has been struggling with is balance as well. He started using a quad cane for ambulation to assist with his balance Pt reports that he has a fear of falling but denies any falls in the last 12 years. Numbness on the back of LLE for multiple months. Pt reports numbness in the bottom of both feet.    Patient Stated Goals  Improve balance    Currently in Pain?  Yes    Pain Score  4     Pain Location  Leg    Pain Orientation  Posterior    Pain Descriptors / Indicators  Discomfort;Tightness;Spasm    Pain Type  Chronic pain    Pain Onset  More than a month ago    Pain Frequency  Intermittent       Treatment:   There-Ex  Nustep - 5 minutes Level 2 (2 minutes untimed)   Side steps in  bars  x5 passes in each direction. Faded verbal cueing provided for ER of L foot.  Visual cueing provided throughout.  Pt able to self correct as exercise progressed with visual feedback   L Piriformis and figure 4 stretching 3x30 sec.  Educated on sciatic pain, given HEP handout, and returned demonstration.     Neuromuscular Re-education   Tandem stance forward walking in // bars x10 passes.  Intermittent UE support for steadying.  With faded need to UE support as exercise progressed.  Can progress next session   DL stance on foam with feet together, eyes closed, horizontal head turns 3x30 sec   Semi tandem stance on foam, eyes closed, 2x30 sec with R foot in front of L, and 2x30 sec with L foot in front of R.  Pt demonstrates more difficulty with L in front of R, requiring more ankle strategy and intermittent UE support for steadying   SBA to CGA throughout for steadying for all neuromuscular re-education  interventions    Pt demonstrates excellent motivation throughout today's session.  After initial verbal and visual cueing to avoid L hip ER during side stepping, pt is able to self correct with visual feedback.  He is showing improvements in his balance with tandem walking, and will progress the exercise next session.  Pt continues to demonstrate unsteadiness with horizontal head turns while standing on foam with eyes closed, feet together, requiring intermittent UE support against the // bars for steadying.    Pt demonstrates increased difficulty with maintaining tandem stance on foam with eyes closed, modifying to a semi-tandem stance, especially with L foot in front.  Without UE support, pt demonstrates an appropriate level of unsteadiness, requiring CGA for steadying intermittently.  Pt educated and provided with HEP handouts for L piriformis stretch and figure 4 stretch to address sciatic pain complaints.  Pt also displays a increased amount of difficulty with bed mobility when getting up from mat after performing piriformis and figure 4 stretches.  He displays a lack of core activation, trunk rotation, and UE support, and general body mechanics/technique when coming from supine to sitting on side of bed.  We will start addressing this in future sessions.  Pt educated throughout session about proper posture and technique with exercises. Improved exercise technique, movement at target joints, use of target muscles after min to mod verbal, visual, tactile cues.  Pt will benefit from continued skilled PT services to improve his static and dynamic balance, as well as improving his strength and technique.           PT Short Term Goals - 03/29/19 1235      PT SHORT TERM GOAL #1   Title  Pt will be independent with HEP in order to improve strength and balance in order to decrease fall risk and improve function at home.    Time  6    Period  Weeks    Status  New    Target Date  05/08/19        PT  Long Term Goals - 03/29/19 1235      PT LONG TERM GOAL #1   Title  Pt will improve BERG by at least 3 points in order to demonstrate clinically significant improvement in balance.    Baseline  03/27/19: 45/56    Time  12    Period  Weeks    Status  New    Target Date  06/21/19      PT LONG TERM GOAL #2  Title  Pt will decrease TUG to below 14 seconds/decrease in order to demonstrate decreased fall risk.    Baseline  03/27/19: 15.6s    Time  12    Period  Weeks    Status  New    Target Date  06/21/19      PT LONG TERM GOAL #3   Title  Pt will improve ABC by at least 13% in order to demonstrate clinically significant improvement in balance confidence.    Baseline  03/27/19: 41.25%    Time  12    Period  Weeks    Status  New    Target Date  06/19/19      PT LONG TERM GOAL #4   Title  Pt will decrease 5TSTS by at least 3 seconds in order to demonstrate clinically significant improvement in LE strength.    Baseline  03/27/19: 18.1s    Time  12    Period  Weeks    Status  New    Target Date  06/19/19      PT LONG TERM GOAL #5   Title  Pt will increase self-selected 10MWT by at least 0.13 m/s in order to demonstrate clinically significant improvement in community ambulation.    Baseline  03/27/19: Self-selected: 16.5s = 0.61 m/s; Fastest: 10.0s = 1.0 m/s    Time  12    Period  Weeks    Status  New    Target Date  06/19/19            Plan - 04/05/19 1051    Clinical Impression Statement  Pt demonstrates excellent motivation throughout today's session.  After initial verbal and visual cueing to avoid L hip ER during side stepping, pt is able to self correct with visual feedback.  He is showing improvements in his balance with tandem walking, and will progress the exercise next session.  Pt continues to demonstrate unsteadiness with horizontal head turns while standing on foam with eyes closed, feet together, requiring intermittent UE support against the // bars for steadying.    Pt  demonstrates increased difficulty with maintaining tandem stance on foam with eyes closed, modifying to a semi-tandem stance, especially with L foot in front.  Without UE support, pt demonstrates an appropriate level of unsteadiness, requiring CGA for steadying intermittently.  Pt educated and provided with HEP handouts for L piriformis stretch and figure 4 stretch to address sciatic pain complaints.  Pt also displays a increased amount of difficulty with bed mobility when getting up from mat after performing piriformis and figure 4 stretches.  He displays a lack of core activation, trunk rotation, and UE support, and general body mechanics/technique when coming from supine to sitting on side of bed.  We will start addressing this in future sessions.  Pt educated throughout session about proper posture and technique with exercises. Improved exercise technique, movement at target joints, use of target muscles after min to mod verbal, visual, tactile cues.  Pt will benefit from continued skilled PT services to improve his static and dynamic balance, as well as improving his strength and technique.    Personal Factors and Comorbidities  Comorbidity 1;Age;Comorbidity 2    Comorbidities  HTN, polyarthralgia    Examination-Activity Limitations  Locomotion Level;Squat;Stairs;Transfers    Examination-Participation Restrictions  Cleaning;Community Activity;Laundry;Shop;Yard Work    [employer]  Evolving/Moderate complexity    Rehab Potential  Good    PT Frequency  2x / week    PT Duration  12  weeks    PT Treatment/Interventions  ADLs/Self Care Home Management;Aquatic Therapy;Biofeedback;Canalith Repostioning;Cryotherapy;Electrical Stimulation;Moist Heat;Traction;Ultrasound;DME Instruction;Gait training    PT Next Visit Plan  bed mobility, balance and strength exercises    PT Home Exercise Plan  Medbridge Access Code: [facility]; [email or web address]    Consulted and Agree with Plan of Care  Patient        Patient will benefit from skilled therapeutic intervention in order to improve the following deficits and impairments:  Abnormal gait, Decreased balance, Decreased endurance, Decreased mobility, Decreased strength, Difficulty walking  Visit Diagnosis: 1. Unsteadiness on feet   2. Muscle weakness (generalized)        Problem List Patient Active Problem List   Diagnosis Date Noted  . Weakness of both lower extremities 02/24/2019  . Low energy 02/24/2019  . Seropositive rheumatoid arthritis ([place]) 01/04/2019  . High risk medication use 01/04/2019  . Current chronic use of systemic steroids 12/21/2018  . Lower extremity edema 12/21/2018  . 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 BPH (benign prostatic hyperplasia) 12/13/2018  . Thoracic aorta atherosclerosis ([place]) 06/15/2018  . Pedal edema 05/10/2018  . 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 Left foot pain 11/15/2015  . 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 Hypertensive retinopathy of both eyes, grade 1   . Health maintenance examination 08/10/2014  . Advanced care planning/counseling discussion 08/10/2014  . Bilateral hearing loss due to cerumen impaction 08/10/2014  . Essential tremor 09/09/2012  . Medicare annual wellness visit, subsequent 08/07/2011  . History of colonic polyps   . Diverticulosis of colon   . HTN (hypertension)   . HLD (hyperlipidemia)   . Controlled type 2 diabetes mellitus without complication, without long-term current use of insulin ([place]) 08/11/2010  . Vitamin D deficiency 08/07/2010  . 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 Elevated PSA 02/16/2007    This entire session was performed under direct supervision and direction of a licensed therapist/therapist assistant . I have personally read, edited and approve of the note as written.    Lutricia Horsfall, SPT [facility] Grout PT, DPT, GCS  Huprich,Jason 04/05/2019, 2:24 PM  [place] MAIN [facility] SERVICES [address] [place], [place], [address] Phone: [phone]   Fax:  [phone]  Name: Blake Peterson MRN: [id number] Date of Birth: 03-26-1941

## 2019-04-05 NOTE — Patient Instructions (Signed)
Access Code: [email or web address]   URL: [email or web address]   Date: 04/05/2019   Prepared by: Roxana Hires     Exercises   Supine Piriformis Stretch with Foot on Ground - 3 reps - 30 hold - 2x daily - 7x weekly   Supine Figure 4 Piriformis Stretch - 3 reps - 30 hold - 2x daily - 7x weekly

## 2019-04-10 ENCOUNTER — Ambulatory Visit: Payer: PPO | Admitting: Occupational Therapy

## 2019-04-10 ENCOUNTER — Encounter: Payer: Self-pay | Admitting: Occupational Therapy

## 2019-04-10 ENCOUNTER — Ambulatory Visit: Payer: PPO

## 2019-04-10 ENCOUNTER — Other Ambulatory Visit: Payer: Self-pay

## 2019-04-10 DIAGNOSIS — R2681 Unsteadiness on feet: Secondary | ICD-10-CM | POA: Diagnosis not present

## 2019-04-10 DIAGNOSIS — R278 Other lack of coordination: Secondary | ICD-10-CM

## 2019-04-10 DIAGNOSIS — M6281 Muscle weakness (generalized): Secondary | ICD-10-CM

## 2019-04-10 DIAGNOSIS — M25542 Pain in joints of left hand: Secondary | ICD-10-CM

## 2019-04-10 DIAGNOSIS — M25541 Pain in joints of right hand: Secondary | ICD-10-CM

## 2019-04-10 NOTE — Therapy (Signed)
[place] MAIN [facility] SERVICES [address] [place], [place], [address] Phone: [phone]   Fax:  [phone]  Physical Therapy Treatment  Patient Details  Name: Blake Peterson MRN: [id number] Date of Birth: Sep 08, 1941 Referring Provider (PT): Dr. Annalee Genta   Encounter Date: 04/10/2019  PT End of Session - 04/10/19 1028    Visit Number  4    Number of Visits  25    Date for PT Re-Evaluation  06/19/19    Authorization Type  eval 03/27/19    PT Start Time  0930    PT Stop Time  1015    PT Time Calculation (min)  45 min    Equipment Utilized During Treatment  Gait belt    Activity Tolerance  Patient tolerated treatment well    Behavior During Therapy  [facility] for tasks assessed/performed       Past Medical History:  Diagnosis Date  . Basal cell carcinoma 12/2016   R anterior tibia  . Cataract   . Diverticulosis of colon   . History of colonic polyps   . HLD (hyperlipidemia)   . HTN (hypertension)   . Hypertensive retinopathy of both eyes, grade 1    [name]  . 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 Prediabetes   . 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 CATARACT EXTRACTION, BILATERAL  06/2017  . COLONOSCOPY  11/2012   mild-mod diverticulosis, int hem, rec rpt 5 yrs Fuller Plan)  . COLONOSCOPY  01/2018   2 TA, mod diverticulosis, rpt 5 yrs Fuller Plan)  . POLYPECTOMY    . UMBILICAL HERNIA REPAIR  01/07/04    There were no vitals filed for this visit.  Subjective Assessment - 04/10/19 0941    Subjective  Pt reports that he is doing well today.  He reports mild soreness in his hamstring.  He reports that the irritation in the back of his leg has decreased with the figure 4 and piriformis stretching, but is still present. Pt states that he has been compliant with his HEP. No new questions or comments at this time.    Pertinent History  Pt reports issues with generalized pain in multiple joints  of the body including ankles, knees, hips, and hands. Pt reports that he had plain film radiographs of his shoulders and feet. 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 Pt states that he has started having difficulty getting out of bed due to weakness. He has been struggling with is balance as well. He started using a quad cane for ambulation to assist with his balance Pt reports that he has a fear of falling but denies any falls in the last 12 years. Numbness on the back of LLE for multiple months. Pt reports numbness in the bottom of both feet.    Patient Stated Goals  Improve balance    Pain Onset  --        Treatment:  There-Ex   Figure 4 stretching and piriformis 3x30 sec   Supine bridges 2x10 with a 3 second hold at the top   Seated trunk rotations 2x10 bilaterally.  cued to look over shoulders    Cross over step ups onto stairs for glut activation 2x10 (one facing each direction)   Neuromuscular Re-education   Tandem forward and backward walking on airex balance beam in // bars x5 passes in each direction.  Minimal use of  intermittent unilateral UE support for steadying when walking forward, BUE support throughout with backwards walking.   Half rolling with L knee bent and reaching with L arm across body to start rolling to R 2x10   Legs over side of mat with pushing up from bed for supine to sit EOB; cued to use BUE to help push self up from sidelying into sitting x10     Pt educated throughout session about proper posture and technique with exercises. Improved exercise technique, movement at target joints, use of target muscles after min to mod verbal, visual, tactile cues.    Pt demonstrates excellent motivation throughout today's session.  Pt demonstrates proper technique on figure 4 and piriformis stretching, and was instructed on how to increase the stretch depth throughout the week as it becomes easier.  He continues to demonstrate  improvements in his balance with tandem walking on foam, with decreased use of UE support.  He demonstrates difficulty with trunk rotation/ trunk dissociation and decreased arm swing during gait.  With seated rotations, pt is able to demonstrate ~45 deg of trunk rotation bilaterally, with a resting tremor noted in BUE, more noticeable on his R hand.  He demonstrates difficulty with coordinating the sidelying to sitting EOB motion, with improvements noted after moderate cueing provided to utilize BUE to help push himself into sitting.  Pt showed difficulty with bed mobility elements, but did well with segmented practice, focusing on rolling from supine to sidelying, and then from sidelying to sitting EOB.    Pt educated throughout session about proper posture and technique with exercises. Improved exercise technique, movement at target joints, use of target muscles after min to mod verbal, visual, tactile cues.  Pt will benefit from continued skilled PT services to improve his static and dynamic balance, as well as improving his strength and technique.     PT Short Term Goals - 03/29/19 1235      PT SHORT TERM GOAL #1   Title  Pt will be independent with HEP in order to improve strength and balance in order to decrease fall risk and improve function at home.    Time  6    Period  Weeks    Status  New    Target Date  05/08/19        PT Long Term Goals - 03/29/19 1235      PT LONG TERM GOAL #1   Title  Pt will improve BERG by at least 3 points in order to demonstrate clinically significant improvement in balance.    Baseline  03/27/19: 45/56    Time  12    Period  Weeks    Status  New    Target Date  06/21/19      PT LONG TERM GOAL #2   Title  Pt will decrease TUG to below 14 seconds/decrease in order to demonstrate decreased fall risk.    Baseline  03/27/19: 15.6s    Time  12    Period  Weeks    Status  New    Target Date  06/21/19      PT LONG TERM GOAL #3   Title  Pt will improve ABC  by at least 13% in order to demonstrate clinically significant improvement in balance confidence.    Baseline  03/27/19: 41.25%    Time  12    Period  Weeks    Status  New    Target Date  06/19/19      PT  LONG TERM GOAL #4   Title  Pt will decrease 5TSTS by at least 3 seconds in order to demonstrate clinically significant improvement in LE strength.    Baseline  03/27/19: 18.1s    Time  12    Period  Weeks    Status  New    Target Date  06/19/19      PT LONG TERM GOAL #5   Title  Pt will increase self-selected 10MWT by at least 0.13 m/s in order to demonstrate clinically significant improvement in community ambulation.    Baseline  03/27/19: Self-selected: 16.5s = 0.61 m/s; Fastest: 10.0s = 1.0 m/s    Time  12    Period  Weeks    Status  New    Target Date  06/19/19            Plan - 04/10/19 1041    Clinical Impression Statement  Pt demonstrates excellent motivation throughout today's session.  Pt demonstrates proper technique on figure 4 and piriformis stretching, and was instructed on how to increase the stretch depth throughout the week as it becomes easier.  He continues to demonstrate improvements in his balance with tandem walking on foam, with decreased use of UE support.  He demonstrates difficulty with trunk rotation/ trunk dissociation and decreased arm swing during gait.  With seated rotations, pt is able to demonstrate ~45 deg of trunk rotation bilaterally, with a resting tremor noted in BUE, more noticeable on his R hand.  He demonstrates difficulty with coordinating the sidelying to sitting EOB motion, with improvements noted after moderate cueing provided to utilize BUE to help push himself into sitting.  Pt showed difficulty with bed mobility elements, but did well with segmented practice, focusing on rolling from supine to sidelying, and then from sidelying to sitting EOB.    Pt educated throughout session about proper posture and technique with exercises. Improved exercise  technique, movement at target joints, use of target muscles after min to mod verbal, visual, tactile cues.  Pt will benefit from continued skilled PT services to improve his static and dynamic balance, as well as improving his strength and technique.    Personal Factors and Comorbidities  Comorbidity 1;Age;Comorbidity 2    Comorbidities  HTN, polyarthralgia    Examination-Activity Limitations  Locomotion Level;Squat;Stairs;Transfers    Examination-Participation Restrictions  Cleaning;Community Activity;Laundry;Shop;Yard Work    [employer] officer  Evolving/Moderate complexity    Rehab Potential  Good    PT Frequency  2x / week    PT Duration  12 weeks    PT Treatment/Interventions  ADLs/Self Care Home Management;Aquatic Therapy;Biofeedback;Canalith Repostioning;Cryotherapy;Electrical Stimulation;Moist Heat;Traction;Ultrasound;DME Instruction;Gait training    PT Next Visit Plan  bed mobility, trunk rotations, larger steps, balance and strength exercises    PT Home Exercise Plan  Medbridge Access Code: [facility]; [email or web address]    Consulted and Agree with Plan of Care  Patient       Patient will benefit from skilled therapeutic intervention in order to improve the following deficits and impairments:  Abnormal gait, Decreased balance, Decreased endurance, Decreased mobility, Decreased strength, Difficulty walking  Visit Diagnosis: 1. Unsteadiness on feet   2. Muscle weakness (generalized)        Problem List Patient Active Problem List   Diagnosis Date Noted  . Weakness of both lower extremities 02/24/2019  . Low energy 02/24/2019  . Seropositive rheumatoid arthritis ([place]) 01/04/2019  . High risk medication use 01/04/2019  . Current chronic use of systemic steroids 12/21/2018  . Lower  extremity edema 12/21/2018  . 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 BPH (benign prostatic hyperplasia) 12/13/2018  . Thoracic aorta atherosclerosis ([place]) 06/15/2018  . Pedal edema 05/10/2018   . 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 Left foot pain 11/15/2015  . 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 Hypertensive retinopathy of both eyes, grade 1   . Health maintenance examination 08/10/2014  . Advanced care planning/counseling discussion 08/10/2014  . Bilateral hearing loss due to cerumen impaction 08/10/2014  . Essential tremor 09/09/2012  . Medicare annual wellness visit, subsequent 08/07/2011  . History of colonic polyps   . Diverticulosis of colon   . HTN (hypertension)   . HLD (hyperlipidemia)   . Controlled type 2 diabetes mellitus without complication, without long-term current use of insulin ([place]) 08/11/2010  . Vitamin D deficiency 08/07/2010  . 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 Elevated PSA 02/16/2007    This entire session was performed under direct supervision and direction of a licensed therapist/therapist assistant . I have personally read, edited and approve of the note as written.    Lutricia Horsfall, SPT [facility] PT, DPT, GCS  Huprich,Jason 04/10/2019, 2:21 PM  [place] MAIN [facility] SERVICES [address] [place], [place], [address] Phone: [phone]   Fax:  [phone]  Name: Blake Peterson MRN: [id number] Date of Birth: 1941-03-12

## 2019-04-12 ENCOUNTER — Ambulatory Visit: Payer: PPO | Admitting: Occupational Therapy

## 2019-04-12 ENCOUNTER — Other Ambulatory Visit: Payer: Self-pay

## 2019-04-12 ENCOUNTER — Ambulatory Visit: Payer: PPO

## 2019-04-12 ENCOUNTER — Encounter: Payer: Self-pay | Admitting: Occupational Therapy

## 2019-04-12 DIAGNOSIS — R2681 Unsteadiness on feet: Secondary | ICD-10-CM

## 2019-04-12 DIAGNOSIS — M6281 Muscle weakness (generalized): Secondary | ICD-10-CM

## 2019-04-12 DIAGNOSIS — R278 Other lack of coordination: Secondary | ICD-10-CM

## 2019-04-12 NOTE — Therapy (Signed)
[place] MAIN [facility] SERVICES [address] [place], [place], [address] Phone: [phone]   Fax:  [phone]  Physical Therapy Treatment  Patient Details  Name: Blake Peterson MRN: [id number] Date of Birth: November 03, 1940 Referring Provider (PT): Dr. Annalee Genta   Encounter Date: 04/12/2019  PT End of Session - 04/12/19 1154    Visit Number  5    Number of Visits  25    Date for PT Re-Evaluation  06/19/19    Authorization Type  eval 03/27/19    PT Start Time  0930    PT Stop Time  1015    PT Time Calculation (min)  45 min    Equipment Utilized During Treatment  Gait belt    Activity Tolerance  Patient tolerated treatment well    Behavior During Therapy  [facility] for tasks assessed/performed       Past Medical History:  Diagnosis Date  . Basal cell carcinoma 12/2016   R anterior tibia  . Cataract   . Diverticulosis of colon   . History of colonic polyps   . HLD (hyperlipidemia)   . HTN (hypertension)   . Hypertensive retinopathy of both eyes, grade 1    [name]  . 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 Prediabetes   . 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 CATARACT EXTRACTION, BILATERAL  06/2017  . COLONOSCOPY  11/2012   mild-mod diverticulosis, int hem, rec rpt 5 yrs Fuller Plan)  . COLONOSCOPY  01/2018   2 TA, mod diverticulosis, rpt 5 yrs Fuller Plan)  . POLYPECTOMY    . UMBILICAL HERNIA REPAIR  01/07/04    There were no vitals filed for this visit.  Subjective Assessment - 04/12/19 0936    Subjective  Pt reports that he is doing well today.  He reports no pain or soreness.  He reports that the irritation in the back of his leg is improving. Pt states that he has been compliant with his HEP. No new questions or comments at this time.    Pertinent History  Pt reports issues with generalized pain in multiple joints of the body including ankles, knees, hips, and hands. Pt reports that he had  plain film radiographs of his shoulders and feet. 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 Pt states that he has started having difficulty getting out of bed due to weakness. He has been struggling with is balance as well. He started using a quad cane for ambulation to assist with his balance Pt reports that he has a fear of falling but denies any falls in the last 12 years. Numbness on the back of LLE for multiple months. Pt reports numbness in the bottom of both feet.    Patient Stated Goals  Improve balance    Currently in Pain?  No/denies       Treatment:   There-Ex     Figure 4 stretching and piriformis 3x30 sec (seated and supine)   STSs x10 without UE support   Seated trunk rotations 2x10 bilaterally.  cued to look over shoulders      Sciatic Nerve glides x10      Neuromuscular Re-education    Tandem forward  walking on airex balance beam x2 passes.  Minimal use of intermittent unilateral UE support for steadying when walking forward.     Side steps on airex balance beam x2 passes in each  direction   Agility ladder x2 forward    Agility ladder lateral stepping x2 in each direction    Legs over side of mat with pushing up from bed for supine to sit EOB; cued to use BUE to help push self up from sidelying into sitting x10.  He demonstrates improved technique today, utilizing his UEs for leverage.     Gait with speed changes.        Gait with horizontal head turns   Gait with side steps and horizontal head turns head turns       Pt educated throughout session about proper posture and technique with exercises. Improved exercise technique, movement at target joints, use of target muscles after min to mod verbal, visual, tactile cues.       Pt demonstrates excellent motivation throughout today's session.  Pt demonstrates proper technique on figure 4 stretching, and was cued on improving his technique with the piriformis stretch.   Additionally, he was instructed on how to perform both stretches in sitting, as well as how to incorporate sciatic nerve flossing.  He demonstrates improvements with his bed mobility technique, and was cued to incorporate BUE for leverage to further improve his technique.  He demonstrates improved technique with increased trunk rotation, step length, and arm swing when walking fast.  When walking at his normal and slow speed, he has a tendency to walk with decreased trunk rotation and arm swing.  Pt educated throughout session about proper posture and technique with exercises. Improved exercise technique, movement at target joints, use of target muscles after min to mod verbal, visual, tactile cues.  Pt will benefit from continued skilled PT services to improve his static and dynamic balance, as well as improving his strength and technique.      PT Short Term Goals - 03/29/19 1235      PT SHORT TERM GOAL #1   Title  Pt will be independent with HEP in order to improve strength and balance in order to decrease fall risk and improve function at home.    Time  6    Period  Weeks    Status  New    Target Date  05/08/19        PT Long Term Goals - 03/29/19 1235      PT LONG TERM GOAL #1   Title  Pt will improve BERG by at least 3 points in order to demonstrate clinically significant improvement in balance.    Baseline  03/27/19: 45/56    Time  12    Period  Weeks    Status  New    Target Date  06/21/19      PT LONG TERM GOAL #2   Title  Pt will decrease TUG to below 14 seconds/decrease in order to demonstrate decreased fall risk.    Baseline  03/27/19: 15.6s    Time  12    Period  Weeks    Status  New    Target Date  06/21/19      PT LONG TERM GOAL #3   Title  Pt will improve ABC by at least 13% in order to demonstrate clinically significant improvement in balance confidence.    Baseline  03/27/19: 41.25%    Time  12    Period  Weeks    Status  New    Target Date  06/19/19      PT LONG  TERM GOAL #4   Title  Pt will decrease 5TSTS by at  least 3 seconds in order to demonstrate clinically significant improvement in LE strength.    Baseline  03/27/19: 18.1s    Time  12    Period  Weeks    Status  New    Target Date  06/19/19      PT LONG TERM GOAL #5   Title  Pt will increase self-selected 10MWT by at least 0.13 m/s in order to demonstrate clinically significant improvement in community ambulation.    Baseline  03/27/19: Self-selected: 16.5s = 0.61 m/s; Fastest: 10.0s = 1.0 m/s    Time  12    Period  Weeks    Status  New    Target Date  06/19/19            Plan - 04/12/19 1201    Clinical Impression Statement  Pt demonstrates excellent motivation throughout today's session.  Pt demonstrates proper technique on figure 4 stretching, and was cued on improving his technique with the piriformis stretch.  Additionally, he was instructed on how to perform both stretches in sitting, as well as how to incorporate sciatic nerve flossing.  He demonstrates improvements with his bed mobility technique, and was cued to incorporate BUE for leverage to further improve his technique.  He demonstrates improved technique with increased trunk rotation, step length, and arm swing when walking fast.  When walking at his normal and slow speed, he has a tendency to walk with decreased trunk rotation and arm swing.  Pt educated throughout session about proper posture and technique with exercises. Improved exercise technique, movement at target joints, use of target muscles after min to mod verbal, visual, tactile cues.  Pt will benefit from continued skilled PT services to improve his static and dynamic balance, as well as improving his strength and technique.    Personal Factors and Comorbidities  Comorbidity 1;Age;Comorbidity 2    Comorbidities  HTN, polyarthralgia    Examination-Activity Limitations  Locomotion Level;Squat;Stairs;Transfers    Examination-Participation Restrictions   Cleaning;Community Activity;Laundry;Shop;Yard Work    [employer]  Evolving/Moderate complexity    Rehab Potential  Good    PT Frequency  2x / week    PT Duration  12 weeks    PT Treatment/Interventions  ADLs/Self Care Home Management;Aquatic Therapy;Biofeedback;Canalith Repostioning;Cryotherapy;Electrical Stimulation;Moist Heat;Traction;Ultrasound;DME Instruction;Gait training    PT Next Visit Plan  bed mobility, trunk rotations, larger steps, balance and strength exercises    PT Home Exercise Plan  Medbridge Access Code: [facility]; [email or web address]    Consulted and Agree with Plan of Care  Patient       Patient will benefit from skilled therapeutic intervention in order to improve the following deficits and impairments:  Abnormal gait, Decreased balance, Decreased endurance, Decreased mobility, Decreased strength, Difficulty walking  Visit Diagnosis: 1. Muscle weakness (generalized)   2. Unsteadiness on feet   3. Other lack of coordination        Problem List Patient Active Problem List   Diagnosis Date Noted  . Weakness of both lower extremities 02/24/2019  . Low energy 02/24/2019  . Seropositive rheumatoid arthritis ([place]) 01/04/2019  . High risk medication use 01/04/2019  . Current chronic use of systemic steroids 12/21/2018  . Lower extremity edema 12/21/2018  . 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 BPH (benign prostatic hyperplasia) 12/13/2018  . Thoracic aorta atherosclerosis ([place]) 06/15/2018  . Pedal edema 05/10/2018  . 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 Left foot pain 11/15/2015  . 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 Hypertensive retinopathy of  both eyes, grade 1   . Health maintenance examination 08/10/2014  . Advanced care planning/counseling discussion 08/10/2014  . Bilateral hearing loss due to cerumen impaction 08/10/2014  . Essential tremor 09/09/2012  . Medicare annual wellness visit, subsequent 08/07/2011  . History of colonic  polyps   . Diverticulosis of colon   . HTN (hypertension)   . HLD (hyperlipidemia)   . Controlled type 2 diabetes mellitus without complication, without long-term current use of insulin ([place]) 08/11/2010  . Vitamin D deficiency 08/07/2010  . 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 Elevated PSA 02/16/2007    This entire session was performed under direct supervision and direction of a licensed therapist/therapist assistant . I have personally read, edited and approve of the note as written.    Lutricia Horsfall, SPT Phillips Grout PT, DPT, GCS  Huprich,Jason 04/12/2019, 5:07 PM  [place] MAIN [facility] SERVICES [address] [place], [place], [address] Phone: [phone]   Fax:  [phone]  Name: Blake Peterson MRN: [id number] Date of Birth: 1940-12-11

## 2019-04-12 NOTE — Therapy (Signed)
[place] MAIN [facility] SERVICES [address] [place], [place], [address] Phone: [phone]   Fax:  [phone]  Occupational Therapy Treatment  Patient Details  Name: Blake Peterson MRN: [id number] Date of Birth: Jun 09, 1941 No data recorded  Encounter Date: 04/12/2019  OT End of Session - 04/12/19 1036    Visit Number  2    Number of Visits  24    Date for OT Re-Evaluation  07/03/19    OT Start Time  9675    OT Stop Time  1100    OT Time Calculation (min)  45 min    Activity Tolerance  Patient tolerated treatment well    Behavior During Therapy  [facility] for tasks assessed/performed       Past Medical History:  Diagnosis Date  . Basal cell carcinoma 12/2016   R anterior tibia  . Cataract   . Diverticulosis of colon   . History of colonic polyps   . HLD (hyperlipidemia)   . HTN (hypertension)   . Hypertensive retinopathy of both eyes, grade 1    [name]  . 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 Prediabetes   . 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 CATARACT EXTRACTION, BILATERAL  06/2017  . COLONOSCOPY  11/2012   mild-mod diverticulosis, int hem, rec rpt 5 yrs Fuller Plan)  . COLONOSCOPY  01/2018   2 TA, mod diverticulosis, rpt 5 yrs Fuller Plan)  . POLYPECTOMY    . UMBILICAL HERNIA REPAIR  01/07/04    There were no vitals filed for this visit.  Subjective Assessment - 04/12/19 1034    Subjective   Pt. reports squeezing a ball at home.    Patient Stated Goals  Patient would like to be as independent as possible and help his wife more at home.    Currently in Pain?  No/denies    Pain Type  Acute pain    Pain Onset  More than a month ago    Pain Frequency  Intermittent       OT TREATMENT    Neuro muscular re-education:  Therapeutic Exercise:  Pt. performed bilateral gross gripping with grip strengthener. Pt. worked on sustaining grip while grasping pegs and reaching at various  heights. Gripper was placed in the 3rd resistive slot with the white resistive spring. Pt. Worked on bilateral pinch strengthening for lateral, and 3pt. pinch using yellow, red, green resistive clips. Pt. worked on placing the clips at various vertical and horizontal angles. Tactile and verbal cues were required for eliciting the desired movement.  Pt. worked on green thearputty ex. For hand strengthening. Exercises included: gross gripping, gross digit extension, thumb abduction, lateral, and 3pt. Pinch strengthening, digit abduction, and thumb opposition. Pt. was provided with a visual handout HEP with a personal access code through [place]. Pt. required verbal cues, and visual demonstration to complete each task.                         OT Education - 04/12/19 1036    Education Details  role of OT, goals, POC    Person(s) Educated  Patient    Methods  Explanation    Comprehension  Verbalized understanding          OT Long Term Goals - 04/12/19 0840      OT LONG TERM GOAL #1   Title  Patient will complete light  homemaking tasks with modified independence using joint protection principles.    Baseline  unable at eval    Time  12    Period  Weeks    Status  New    Target Date  07/03/19      OT LONG TERM GOAL #2   Title  Patient will demonstrate light meal preparation with modified independence.    Baseline  unable at eval    Time  6    Period  Weeks    Status  New    Target Date  05/22/19      OT LONG TERM GOAL #3   Title  Patient will demonstrate cutting fruits and vegetables with modified techniques and joint protection principles as well as adaptive equipment as needed.    Baseline  unable at eval    Time  12    Period  Weeks    Status  New    Target Date  07/03/19      OT LONG TERM GOAL #4   Title  Patient will demonstrate hand writing for name address and phone number with 90% legibility    Baseline  decreased legibility at eval    Time  12     Period  Weeks    Status  New    Target Date  07/03/19      OT LONG TERM GOAL #5   Title  Patient will demonstrate improved grip strength by 10 pounds to open jars and containers and to grip steering wheel.    Baseline  decreased at eval    Time  12    Period  Weeks    Status  New    Target Date  07/03/19      Long Term Additional Goals   Additional Long Term Goals  Yes      OT LONG TERM GOAL #6   Title  Patient will complete donning and doffing socks with modified independence.    Baseline  increased difficulty at eval    Time  6    Period  Weeks    Status  New    Target Date  05/22/19            Plan - 04/12/19 1036    Clinical Impression Statement Pt. continues to present with weakness, and limited [facility] skills which limit his ability to donn socks, and perform cooking, and meal preparation tasks. Pt. continues to work on grip strength, pinch strength, and [place] skills in order to improve engagement during ADLs, and IADL functioning.    Occupational performance deficits (Please refer to evaluation for details):  ADL's;IADL's;Social Participation    Body Structure / Function / Physical Skills  ADL;Coordination;UE functional use;Balance;Decreased knowledge of use of DME;Flexibility;IADL;Pain;Dexterity;FMC;Strength;Edema;ROM    Psychosocial Skills  Routines and Behaviors;Environmental  Adaptations;Habits    Rehab Potential  Good    Clinical Decision Making  Limited treatment options, no task modification necessary    Comorbidities Affecting Occupational Performance:  May have comorbidities impacting occupational performance    Modification or Assistance to Complete Evaluation   No modification of tasks or assist necessary to complete eval    OT Frequency  2x / week    OT Duration  12 weeks    OT Treatment/Interventions  Self-care/ADL training;Therapeutic exercise;Moist Heat;Neuromuscular education;Splinting;Patient/family education;Therapeutic activities;Balance  training;Cryotherapy;Contrast Bath;DME and/or AE instruction;Manual Therapy    Consulted and Agree with Plan of Care  Patient       Patient will benefit from skilled therapeutic intervention  in order to improve the following deficits and impairments:   Body Structure / Function / Physical Skills: ADL, Coordination, UE functional use, Balance, Decreased knowledge of use of DME, Flexibility, IADL, Pain, Dexterity, FMC, Strength, Edema, ROM   Psychosocial Skills: Routines and Behaviors, Environmental  Adaptations, Habits   Visit Diagnosis: 1. Muscle weakness (generalized)   2. Other lack of coordination       Problem List Patient Active Problem List   Diagnosis Date Noted  . Weakness of both lower extremities 02/24/2019  . Low energy 02/24/2019  . Seropositive rheumatoid arthritis ([place]) 01/04/2019  . High risk medication use 01/04/2019  . Current chronic use of systemic steroids 12/21/2018  . Lower extremity edema 12/21/2018  . 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 BPH (benign prostatic hyperplasia) 12/13/2018  . Thoracic aorta atherosclerosis ([place]) 06/15/2018  . Pedal edema 05/10/2018  . 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 Left foot pain 11/15/2015  . 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 Hypertensive retinopathy of both eyes, grade 1   . Health maintenance examination 08/10/2014  . Advanced care planning/counseling discussion 08/10/2014  . Bilateral hearing loss due to cerumen impaction 08/10/2014  . Essential tremor 09/09/2012  . Medicare annual wellness visit, subsequent 08/07/2011  . History of colonic polyps   . Diverticulosis of colon   . HTN (hypertension)   . HLD (hyperlipidemia)   . Controlled type 2 diabetes mellitus without complication, without long-term current use of insulin ([place]) 08/11/2010  . Vitamin D deficiency 08/07/2010  . 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 Elevated PSA 02/16/2007    Harrel Carina, MS,  OTR/L 04/12/2019, 10:43 AM  [place] MAIN [facility] SERVICES [address] [place], [place], [address] Phone: [phone]   Fax:  [phone]  Name: Blake Peterson MRN: [id number] Date of Birth: Mar 07, 1941

## 2019-04-12 NOTE — Therapy (Signed)
[place] MAIN [facility] SERVICES [address] [place], [place], [address] Phone: [phone]   Fax:  [phone]  Occupational Therapy Evaluation  Patient Details  Name: Blake Peterson MRN: [id number] Date of Birth: 24-Aug-1941 No data recorded  Encounter Date: 04/10/2019  OT End of Session - 04/12/19 0830    Visit Number  1    Number of Visits  24    Date for OT Re-Evaluation  07/03/19    Authorization Type  MEDICARE VISIT 1    OT Start Time  1015    OT Stop Time  1110    OT Time Calculation (min)  55 min    Activity Tolerance  Patient tolerated treatment well    Behavior During Therapy  [facility] for tasks assessed/performed       Past Medical History:  Diagnosis Date  . Basal cell carcinoma 12/2016   R anterior tibia  . Cataract   . Diverticulosis of colon   . History of colonic polyps   . HLD (hyperlipidemia)   . HTN (hypertension)   . Hypertensive retinopathy of both eyes, grade 1    [name]  . 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 Prediabetes   . 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 CATARACT EXTRACTION, BILATERAL  06/2017  . COLONOSCOPY  11/2012   mild-mod diverticulosis, int hem, rec rpt 5 yrs Fuller Plan)  . COLONOSCOPY  01/2018   2 TA, mod diverticulosis, rpt 5 yrs Fuller Plan)  . POLYPECTOMY    . UMBILICAL HERNIA REPAIR  01/07/04    There were no vitals filed for this visit.     [facility] OT Assessment - 04/12/19 0827      Assessment   Medical Diagnosis  Polyarthralgia    Onset Date/Surgical Date  11/20/18    Hand Dominance  Right      Precautions   Precautions  Fall      Restrictions   Weight Bearing Restrictions  No      Balance Screen   Has the patient fallen in the past 6 months  No    Has the patient had a decrease in activity level because of a fear of falling?   No    Is the patient reluctant to leave their home because of a fear of falling?   No      Home   Environment   Family/patient expects to be discharged to:  Private residence    Living Arrangements  Spouse/significant other    Available Help at Discharge  Family    Type of [place]  One level    Alternate Level Stairs - Number of Steps  3 steps to enter    [place] - 2 wheels;Cane - single point;Bedside commode    Lives With  Spouse      Prior Function   Vocation  Retired    [employer]  Retired Event organiser    Leisure  Used to [employer], reading, likes to build projects.      ADL   Eating/Feeding  Modified independent    Grooming  Modified independent    Upper Body Bathing  Modified independent    Lower Body Bathing  Modified independent  Upper Body Dressing  Increased time;Needs assist for fasteners    Lower Body Dressing  Increased time;Minimal assistance    Toilet Transfer  Modified independent    Toileting - Clothing Manipulation  Increased time    Toileting -  Hygiene  Modified Independent    Tub/Shower Transfer  Supervision/safety    ADL comments  Patient reports he is able to feed himself but spills the food all over often. He has now been shaving just the last couple of weeks and that is going well. He has some difficulty with donning and doffing socks. He has not done any driving since May secondary to decreased grip on the steering wheel. He has not been able to perform check writing and has difficulty with opening jars and containers. He used to help with housework but is unable to perform these tests now and his son currently doing the yardwork.  He reports he would like to be able to walk and help his wife with the housework, make a sandwich grocery shop.  He is unable to clean the floors, mop, sweep or wash windows.  Would like to be able to slice tomatoes, peaches and peel an apple.       IADL   Prior  Level of Function Shopping  independent    Shopping  Needs to be accompanied on any shopping trip    Prior Level of Function Light Housekeeping  iindependent    Light Housekeeping  Needs help with all home maintenance tasks;Does not participate in any housekeeping tasks    Prior Level of Function Meal Prep  independent    Meal Prep  Needs to have meals prepared and served    Prior Level of Function IT consultant on family or friends for transportation    Prior Level of Function Medication Managment  independent    Medication Management  Takes responsibility if medication is prepared in advance in seperate dosage    Prior Level of Function Financial Management  independent    Financial Management  Requires assistance      Mobility   Mobility Status  Independent      Written Expression   Dominant Hand  Right      Vision - History   Baseline Vision  No visual deficits      Cognition   Overall Cognitive Status  Within Functional Limits for tasks assessed    Memory  Appears intact    Cognition Comments  Patient feels like he has a hard time remembering names      Coordination   9 Hole Peg Test  Right;Left    Right 9 Hole Peg Test  31 secs    Left 9 Hole Peg Test  33 secs      ROM / Strength   AROM / PROM / Strength  Strength      AROM   Overall AROM   Within functional limits for tasks performed      Strength   Overall Strength  Deficits    Overall Strength Comments  4/5 overall shoulder and elbow strength      Hand Function   Right Hand Grip (lbs)  25    Right Hand Lateral Pinch  12 lbs    Right Hand 3 Point Pinch  8 lbs    Left Hand Grip (lbs)  17    Left Hand Lateral Pinch  9 lbs    Left 3 point pinch  10 lbs    Comment  2 point pinch 6#, left 7#                      OT Education - 04/12/19 0829    Education Details  role of OT, goals, POC    Person(s) Educated  Patient    Methods  Explanation     Comprehension  Verbalized understanding          OT Long Term Goals - 04/12/19 0840      OT LONG TERM GOAL #1   Title  Patient will complete light homemaking tasks with modified independence using joint protection principles.    Baseline  unable at eval    Time  12    Period  Weeks    Status  New    Target Date  07/03/19      OT LONG TERM GOAL #2   Title  Patient will demonstrate light meal preparation with modified independence.    Baseline  unable at eval    Time  6    Period  Weeks    Status  New    Target Date  05/22/19      OT LONG TERM GOAL #3   Title  Patient will demonstrate cutting fruits and vegetables with modified techniques and joint protection principles as well as adaptive equipment as needed.    Baseline  unable at eval    Time  12    Period  Weeks    Status  New    Target Date  07/03/19      OT LONG TERM GOAL #4   Title  Patient will demonstrate hand writing for name address and phone number with 90% legibility    Baseline  decreased legibility at eval    Time  12    Period  Weeks    Status  New    Target Date  07/03/19      OT LONG TERM GOAL #5   Title  Patient will demonstrate improved grip strength by 10 pounds to open jars and containers and to grip steering wheel.    Baseline  decreased at eval    Time  12    Period  Weeks    Status  New    Target Date  07/03/19      Long Term Additional Goals   Additional Long Term Goals  Yes      OT LONG TERM GOAL #6   Title  Patient will complete donning and doffing socks with modified independence.    Baseline  increased difficulty at eval    Time  6    Period  Weeks    Status  New    Target Date  05/22/19            Plan - 04/12/19 0847    Clinical Impression Statement  Patient is a 78 year old male who was diagnosed with polyarthralgia with bilateral hand weakness and referred to occupational therapy for evaluation and treatment.  Patient presents with muscle weakness, decreased  coordination, decreased range of motion, decreased ability to perform self-care and I ADL task, Adema, and decreased balance. He would benefit from skilled occupational therapy to maximize safety and independence in necessary daily tasks.    OT Occupational Profile and History  Detailed Assessment- Review of Records and additional review of physical, cognitive, psychosocial history related to current functional performance    Occupational performance deficits (Please refer to evaluation for details):  ADL's;IADL's;Social Participation    Body Structure / Function / Physical Skills  ADL;Coordination;UE functional use;Balance;Decreased knowledge of use of DME;Flexibility;IADL;Pain;Dexterity;FMC;Strength;Edema;ROM    Psychosocial Skills  Routines and Behaviors;Environmental  Adaptations;Habits    Rehab Potential  Good    Clinical Decision Making  Limited treatment options, no task modification necessary    Comorbidities Affecting Occupational Performance:  May have comorbidities impacting occupational performance    Modification or Assistance to Complete Evaluation   No modification of tasks or assist necessary to complete eval    OT Frequency  2x / week    OT Duration  12 weeks    OT Treatment/Interventions  Self-care/ADL training;Therapeutic exercise;Moist Heat;Neuromuscular education;Splinting;Patient/family education;Therapeutic activities;Balance training;Cryotherapy;Contrast Bath;DME and/or AE instruction;Manual Therapy    Consulted and Agree with Plan of Care  Patient       Patient will benefit from skilled therapeutic intervention in order to improve the following deficits and impairments:   Body Structure / Function / Physical Skills: ADL, Coordination, UE functional use, Balance, Decreased knowledge of use of DME, Flexibility, IADL, Pain, Dexterity, FMC, Strength, Edema, ROM   Psychosocial Skills: Routines and Behaviors, Environmental  Adaptations, Habits   Visit Diagnosis: 1. Muscle  weakness (generalized)   2. Unsteadiness on feet   3. Other lack of coordination   4. Pain in joints of left hand   5. Pain in joint of right hand       Problem List Patient Active Problem List   Diagnosis Date Noted  . Weakness of both lower extremities 02/24/2019  . Low energy 02/24/2019  . Seropositive rheumatoid arthritis ([place]) 01/04/2019  . High risk medication use 01/04/2019  . Current chronic use of systemic steroids 12/21/2018  . Lower extremity edema 12/21/2018  . 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 BPH (benign prostatic hyperplasia) 12/13/2018  . Thoracic aorta atherosclerosis ([place]) 06/15/2018  . Pedal edema 05/10/2018  . 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 Left foot pain 11/15/2015  . 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 Hypertensive retinopathy of both eyes, grade 1   . Health maintenance examination 08/10/2014  . Advanced care planning/counseling discussion 08/10/2014  . Bilateral hearing loss due to cerumen impaction 08/10/2014  . Essential tremor 09/09/2012  . Medicare annual wellness visit, subsequent 08/07/2011  . History of colonic polyps   . Diverticulosis of colon   . HTN (hypertension)   . HLD (hyperlipidemia)   . Controlled type 2 diabetes mellitus without complication, without long-term current use of insulin ([place]) 08/11/2010  . Vitamin D deficiency 08/07/2010  . 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 Elevated PSA 02/16/2007   Achilles Dunk, OTR/L, CLT  Isaac Dubie 04/12/2019, 8:55 AM  [place] MAIN [facility] SERVICES [address] [place], [place], [address] Phone: [phone]   Fax:  [phone]  Name: Blake Peterson MRN: [id number] Date of Birth: Jan 18, 1941

## 2019-04-14 ENCOUNTER — Other Ambulatory Visit: Payer: Self-pay | Admitting: Family Medicine

## 2019-04-17 ENCOUNTER — Ambulatory Visit: Payer: PPO | Admitting: Occupational Therapy

## 2019-04-17 ENCOUNTER — Encounter: Payer: Self-pay | Admitting: Occupational Therapy

## 2019-04-17 ENCOUNTER — Ambulatory Visit: Payer: PPO

## 2019-04-17 ENCOUNTER — Other Ambulatory Visit: Payer: Self-pay

## 2019-04-17 DIAGNOSIS — R2681 Unsteadiness on feet: Secondary | ICD-10-CM | POA: Diagnosis not present

## 2019-04-17 DIAGNOSIS — R278 Other lack of coordination: Secondary | ICD-10-CM

## 2019-04-17 DIAGNOSIS — M6281 Muscle weakness (generalized): Secondary | ICD-10-CM

## 2019-04-17 NOTE — Patient Instructions (Signed)
Access Code: [email or web address]  URL: [email or web address]  Date: 04/17/2019  Prepared by: Roxana Hires   Exercises Seated Slump Nerve Glide - 3 sets - 30 hold - 1x daily - 7x weekly Long Sitting Hamstring Stretch - 3 sets - 30 hold - 1x daily - 7x weekly Seated Hamstring Stretch - 3 sets - 30 hold - 1x daily - 7x weekly Supine Hamstring Stretch with Caregiver - 3 sets - 30 hold - 1x daily - 7x weekly Hooklying Hamstring Stretch - 3 sets - 30 hold - 1x daily - 7x weekly

## 2019-04-17 NOTE — Therapy (Signed)
[place] MAIN [facility] SERVICES [address] [place], [place], [address] Phone: [phone]   Fax:  [phone]  Occupational Therapy Treatment  Patient Details  Name: Blake Peterson MRN: [id number] Date of Birth: 02-24-1941 No data recorded  Encounter Date: 04/17/2019  OT End of Session - 04/17/19 1029    Visit Number  3    Number of Visits  24    Date for OT Re-Evaluation  07/03/19    OT Start Time  1018    OT Stop Time  1100    OT Time Calculation (min)  42 min    Activity Tolerance  Patient tolerated treatment well    Behavior During Therapy  [facility] for tasks assessed/performed       Past Medical History:  Diagnosis Date  . Basal cell carcinoma 12/2016   R anterior tibia  . Cataract   . Diverticulosis of colon   . History of colonic polyps   . HLD (hyperlipidemia)   . HTN (hypertension)   . Hypertensive retinopathy of both eyes, grade 1    [name]  . 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 Prediabetes   . 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 CATARACT EXTRACTION, BILATERAL  06/2017  . COLONOSCOPY  11/2012   mild-mod diverticulosis, int hem, rec rpt 5 yrs Fuller Plan)  . COLONOSCOPY  01/2018   2 TA, mod diverticulosis, rpt 5 yrs Fuller Plan)  . POLYPECTOMY    . UMBILICAL HERNIA REPAIR  01/07/04    There were no vitals filed for this visit.  Subjective Assessment - 04/17/19 1020    Subjective   Pt. reports needing to catch his breath after PT    Patient is accompanied by:  Family member    Patient Stated Goals  Patient would like to be as independent as possible and help his wife more at home.    Currently in Pain?  No/denies    Pain Score  0-No pain      OT TREATMENT    Neuro muscular re-education:  Pt. performed [facility] skills training to improve speed and dexterity needed for ADL tasks and writing. Pt. demonstrated grasping 1 inch sticks,  inch cylindrical collars, and  inch flat  washers on the [employer]urd[employer] pegboard. Pt. worked on Scientist, product/process development item with his 2nd digit and thumb, and storing them in his palm. Pt. presented with difficulty storing, and moving the  inch flat washers through his hand.  Pt. worked on removing the washers one at a time, and storing them in his hand. Pt. worked on alternating bilateral hand coordination skills when removing the sticks. Pt. worked on tasks to sustain lateral pinch on resistive tweezers while grasping and moving 2" toothpick sticks from a horizontal flat position to a vertical position in order to place it in the holder. Pt. was able to sustain grasp while positioning and extending the wrist/hand in the necessary alignment needed to place the toothpickssticks through the top of the holder. Pt. presents with shakiness when attempting to place them in the holes, which increased as the task progressed.                        OT Education - 04/17/19 1028    Education Details  [facility] skills    Person(s) Educated  Patient    Methods  Explanation    Comprehension  Verbalized understanding  OT Short Term Goals - 04/17/19 1036      OT SHORT TERM GOAL #1   Title  w        OT Long Term Goals - 04/12/19 0840      OT LONG TERM GOAL #1   Title  Patient will complete light homemaking tasks with modified independence using joint protection principles.    Baseline  unable at eval    Time  12    Period  Weeks    Status  New    Target Date  07/03/19      OT LONG TERM GOAL #2   Title  Patient will demonstrate light meal preparation with modified independence.    Baseline  unable at eval    Time  6    Period  Weeks    Status  New    Target Date  05/22/19      OT LONG TERM GOAL #3   Title  Patient will demonstrate cutting fruits and vegetables with modified techniques and joint protection principles as well as adaptive equipment as needed.    Baseline  unable at eval    Time  12    Period  Weeks    Status  New     Target Date  07/03/19      OT LONG TERM GOAL #4   Title  Patient will demonstrate hand writing for name address and phone number with 90% legibility    Baseline  decreased legibility at eval    Time  12    Period  Weeks    Status  New    Target Date  07/03/19      OT LONG TERM GOAL #5   Title  Patient will demonstrate improved grip strength by 10 pounds to open jars and containers and to grip steering wheel.    Baseline  decreased at eval    Time  12    Period  Weeks    Status  New    Target Date  07/03/19      Long Term Additional Goals   Additional Long Term Goals  Yes      OT LONG TERM GOAL #6   Title  Patient will complete donning and doffing socks with modified independence.    Baseline  increased difficulty at eval    Time  6    Period  Weeks    Status  New    Target Date  05/22/19            Plan - 04/17/19 1029    Clinical Impression Statement Pt. had to rest, and catch his breath after PT today. SO2 98%, HR 67.  Pt. reports that he is working with the pink theraputty daily  at home. Pt. continues to present with limited LUE strength, and [facility] skills, and continues to work on improving these skills in order to be able to perform ADL, and IADL functioning.    OT Occupational Profile and History  Detailed Assessment- Review of Records and additional review of physical, cognitive, psychosocial history related to current functional performance    Occupational performance deficits (Please refer to evaluation for details):  ADL's;IADL's;Social Participation    Body Structure / Function / Physical Skills  ADL;Coordination;UE functional use;Balance;Decreased knowledge of use of DME;Flexibility;IADL;Pain;Dexterity;FMC;Strength;Edema;ROM    Psychosocial Skills  Routines and Behaviors;Environmental  Adaptations;Habits    Rehab Potential  Good    Clinical Decision Making  Limited treatment options, no task modification necessary  Comorbidities Affecting Occupational  Performance:  May have comorbidities impacting occupational performance    Modification or Assistance to Complete Evaluation   No modification of tasks or assist necessary to complete eval    OT Frequency  2x / week    OT Duration  12 weeks    OT Treatment/Interventions  Self-care/ADL training;Therapeutic exercise;Moist Heat;Neuromuscular education;Splinting;Patient/family education;Therapeutic activities;Balance training;Cryotherapy;Contrast Bath;DME and/or AE instruction;Manual Therapy    Consulted and Agree with Plan of Care  Patient       Patient will benefit from skilled therapeutic intervention in order to improve the following deficits and impairments:   Body Structure / Function / Physical Skills: ADL, Coordination, UE functional use, Balance, Decreased knowledge of use of DME, Flexibility, IADL, Pain, Dexterity, FMC, Strength, Edema, ROM   Psychosocial Skills: Routines and Behaviors, Environmental  Adaptations, Habits   Visit Diagnosis: 1. Muscle weakness (generalized)   2. Other lack of coordination       Problem List Patient Active Problem List   Diagnosis Date Noted  . Weakness of both lower extremities 02/24/2019  . Low energy 02/24/2019  . Seropositive rheumatoid arthritis ([place]) 01/04/2019  . High risk medication use 01/04/2019  . Current chronic use of systemic steroids 12/21/2018  . Lower extremity edema 12/21/2018  . 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 BPH (benign prostatic hyperplasia) 12/13/2018  . Thoracic aorta atherosclerosis ([place]) 06/15/2018  . Pedal edema 05/10/2018  . 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 Left foot pain 11/15/2015  . 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 Hypertensive retinopathy of both eyes, grade 1   . Health maintenance examination 08/10/2014  . Advanced care planning/counseling discussion 08/10/2014  . Bilateral hearing loss due to cerumen impaction 08/10/2014  . Essential tremor 09/09/2012  . Medicare annual wellness  visit, subsequent 08/07/2011  . History of colonic polyps   . Diverticulosis of colon   . HTN (hypertension)   . HLD (hyperlipidemia)   . Controlled type 2 diabetes mellitus without complication, without long-term current use of insulin ([place]) 08/11/2010  . Vitamin D deficiency 08/07/2010  . 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 Elevated PSA 02/16/2007    Harrel Carina, MS, OTR/L 04/17/2019, 10:38 AM  [place] MAIN [facility] SERVICES [address] [place], [place], [address] Phone: [phone]   Fax:  [phone]  Name: AGAM DAVENPORT MRN: [id number] Date of Birth: 1941/03/11

## 2019-04-17 NOTE — Therapy (Signed)
[place] MAIN [facility] SERVICES [address] [place], [place], [address] Phone: [phone]   Fax:  [phone]  Physical Therapy Treatment  Patient Details  Name: Blake Peterson MRN: [id number] Date of Birth: January 01, 1941 Referring Provider (PT): Dr. Annalee Genta   Encounter Date: 04/17/2019  PT End of Session - 04/17/19 1046    Visit Number  6    Number of Visits  25    Date for PT Re-Evaluation  06/19/19    Authorization Type  eval 03/27/19    PT Start Time  0930    PT Stop Time  1015    PT Time Calculation (min)  45 min    Equipment Utilized During Treatment  Gait belt    Activity Tolerance  Patient tolerated treatment well    Behavior During Therapy  [facility] for tasks assessed/performed       Past Medical History:  Diagnosis Date  . Basal cell carcinoma 12/2016   R anterior tibia  . Cataract   . Diverticulosis of colon   . History of colonic polyps   . HLD (hyperlipidemia)   . HTN (hypertension)   . Hypertensive retinopathy of both eyes, grade 1    [name]  . 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 Prediabetes   . 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 CATARACT EXTRACTION, BILATERAL  06/2017  . COLONOSCOPY  11/2012   mild-mod diverticulosis, int hem, rec rpt 5 yrs Fuller Plan)  . COLONOSCOPY  01/2018   2 TA, mod diverticulosis, rpt 5 yrs Fuller Plan)  . POLYPECTOMY    . UMBILICAL HERNIA REPAIR  01/07/04    There were no vitals filed for this visit.  Subjective Assessment - 04/17/19 1043    Subjective  Pt reports that he is doing well today.  He reports that he felt some cramping in his foot after last session, but that it only lasted a day.  He reports that he is still experiencing the tightness and aching in his L hamstring, but that he feels relief when he walks around or does the stretches provided. No new questions or comments at this time.    Pertinent History  Pt reports issues with  generalized pain in multiple joints of the body including ankles, knees, hips, and hands. Pt reports that he had plain film radiographs of his shoulders and feet. 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 Pt states that he has started having difficulty getting out of bed due to weakness. He has been struggling with is balance as well. He started using a quad cane for ambulation to assist with his balance Pt reports that he has a fear of falling but denies any falls in the last 12 years. Numbness on the back of LLE for multiple months. Pt reports numbness in the bottom of both feet.    Patient Stated Goals  Improve balance    Currently in Pain?  Yes    Pain Score  2     Pain Location  Leg    Pain Orientation  Left;Posterior    Pain Descriptors / Indicators  Aching;Cramping    Pain Type  Chronic pain    Pain Onset  More than a month ago    Pain Frequency  Intermittent          Treatment:   There-Ex   PT assisted supine hamstring stretch 4x30 sec; L  hamstring displays decreased flexibility compared to R, with both displaying limited flexibility   Seated Sciatic nerve glidesx10   STSs 2x10 without UE support; intermittently cued to not use his UE to push off his knees   Step ups on 6" box 2x10 bilaterally   Bed mobility demonstrated independently bilaterally     Neuromuscular Re-education   Forward gait with speed changes 75'x2   Forward gait with horizontal head turns 75'x2   Lateral stepping 75' in each direction   Retrogait 75'x1   Retrogait with horizontal head turns 75'x2     Pt demonstrates excellent motivation throughout today's session.  He demonstrates much improvement in his bed mobility technique bilaterally, able to complete without difficulty anymore.  He continues to have L hamstring achiness/cramping that is relieved with piriformis and glut stretching or walking, but it hasn't made improvements in the last week.  His hamstring  flexibility was assessed today, and he has decreased ROM bilaterally, with markedly decreased L hamstring flexibility.  Hamstring stretches were added to HEP, and we will reassess L hamstring achiness/cramping next visit.  He continues to demonstrate decreased trunk rotation, arm swing, and hip flexion when walking at his self-selected speed, but is able to incorporate them slightly more with increases in gait speed.  Pt educated throughout session about proper posture and technique with exercises. Improved exercise technique, movement at target joints, use of target muscles after min to mod verbal, visual, tactile cues.  Pt will benefit from continued skilled PT services to improve his static and dynamic balance, as well as improving his strength and technique.      PT Short Term Goals - 03/29/19 1235      PT SHORT TERM GOAL #1   Title  Pt will be independent with HEP in order to improve strength and balance in order to decrease fall risk and improve function at home.    Time  6    Period  Weeks    Status  New    Target Date  05/08/19        PT Long Term Goals - 03/29/19 1235      PT LONG TERM GOAL #1   Title  Pt will improve BERG by at least 3 points in order to demonstrate clinically significant improvement in balance.    Baseline  03/27/19: 45/56    Time  12    Period  Weeks    Status  New    Target Date  06/21/19      PT LONG TERM GOAL #2   Title  Pt will decrease TUG to below 14 seconds/decrease in order to demonstrate decreased fall risk.    Baseline  03/27/19: 15.6s    Time  12    Period  Weeks    Status  New    Target Date  06/21/19      PT LONG TERM GOAL #3   Title  Pt will improve ABC by at least 13% in order to demonstrate clinically significant improvement in balance confidence.    Baseline  03/27/19: 41.25%    Time  12    Period  Weeks    Status  New    Target Date  06/19/19      PT LONG TERM GOAL #4   Title  Pt will decrease 5TSTS by at least 3 seconds in order to  demonstrate clinically significant improvement in LE strength.    Baseline  03/27/19: 18.1s    Time  12  Period  Weeks    Status  New    Target Date  06/19/19      PT LONG TERM GOAL #5   Title  Pt will increase self-selected 10MWT by at least 0.13 m/s in order to demonstrate clinically significant improvement in community ambulation.    Baseline  03/27/19: Self-selected: 16.5s = 0.61 m/s; Fastest: 10.0s = 1.0 m/s    Time  12    Period  Weeks    Status  New    Target Date  06/19/19            Plan - 04/17/19 1058    Clinical Impression Statement  Pt demonstrates excellent motivation throughout today's session.  He demonstrates much improvement in his bed mobility technique bilaterally, able to complete without difficulty anymore.  He continues to have L hamstring achiness/cramping that is relieved with piriformis and glut stretching or walking, but it hasn't made improvements in the last week.  His hamstring flexibility was assessed today, and he has decreased ROM bilaterally, with markedly decreased L hamstring flexibility.  Hamstring stretches were added to HEP, and we will reassess L hamstring achiness/cramping next visit.  He continues to demonstrate decreased trunk rotation, arm swing, and hip flexion when walking at his self-selected speed, but is able to incorporate them slightly more with increases in gait speed.  Pt educated throughout session about proper posture and technique with exercises. Improved exercise technique, movement at target joints, use of target muscles after min to mod verbal, visual, tactile cues.  Pt will benefit from continued skilled PT services to improve his static and dynamic balance, as well as improving his strength and technique.    Personal Factors and Comorbidities  Comorbidity 1;Age;Comorbidity 2    Comorbidities  HTN, polyarthralgia    Examination-Activity Limitations  Locomotion Level;Squat;Stairs;Transfers    Examination-Participation Restrictions   Cleaning;Community Activity;Laundry;Shop;Yard Work    [employer]  Evolving/Moderate complexity    Rehab Potential  Good    PT Frequency  2x / week    PT Duration  12 weeks    PT Treatment/Interventions  ADLs/Self Care Home Management;Aquatic Therapy;Biofeedback;Canalith Repostioning;Cryotherapy;Electrical Stimulation;Moist Heat;Traction;Ultrasound;DME Instruction;Gait training    PT Next Visit Plan  bed mobility, trunk rotations, larger steps, balance and strength exercises    PT Home Exercise Plan  Medbridge Access Code: [email or web address]    Consulted and Agree with Plan of Care  Patient       Patient will benefit from skilled therapeutic intervention in order to improve the following deficits and impairments:  Abnormal gait, Decreased balance, Decreased endurance, Decreased mobility, Decreased strength, Difficulty walking  Visit Diagnosis: 1. Muscle weakness (generalized)   2. Other lack of coordination   3. Unsteadiness on feet        Problem List Patient Active Problem List   Diagnosis Date Noted  . Weakness of both lower extremities 02/24/2019  . Low energy 02/24/2019  . Seropositive rheumatoid arthritis ([place]) 01/04/2019  . High risk medication use 01/04/2019  . Current chronic use of systemic steroids 12/21/2018  . Lower extremity edema 12/21/2018  . 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 BPH (benign prostatic hyperplasia) 12/13/2018  . Thoracic aorta atherosclerosis ([place]) 06/15/2018  . Pedal edema 05/10/2018  . 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 Left foot pain 11/15/2015  . 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 Hypertensive retinopathy of both eyes, grade 1   . Health maintenance examination 08/10/2014  . Advanced care planning/counseling discussion 08/10/2014  .  Bilateral hearing loss due to cerumen impaction 08/10/2014  . Essential tremor 09/09/2012  . Medicare annual wellness visit, subsequent 08/07/2011  . History of  colonic polyps   . Diverticulosis of colon   . HTN (hypertension)   . HLD (hyperlipidemia)   . Controlled type 2 diabetes mellitus without complication, without long-term current use of insulin ([place]) 08/11/2010  . Vitamin D deficiency 08/07/2010  . 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 Elevated PSA 02/16/2007    This entire session was performed under direct supervision and direction of a licensed therapist/therapist assistant . I have personally read, edited and approve of the note as written.    Lutricia Horsfall, SPT Phillips Grout PT, DPT, GCS  Huprich,Jason 04/17/2019, 1:29 PM  [place] MAIN [facility] SERVICES [address] [place], [place], [address] Phone: [phone]   Fax:  [phone]  Name: Blake Peterson MRN: [id number] Date of Birth: 12/18/1940

## 2019-04-18 ENCOUNTER — Other Ambulatory Visit: Payer: Self-pay | Admitting: Family Medicine

## 2019-04-19 ENCOUNTER — Other Ambulatory Visit: Payer: Self-pay

## 2019-04-19 ENCOUNTER — Ambulatory Visit: Payer: PPO

## 2019-04-19 ENCOUNTER — Ambulatory Visit: Payer: PPO | Admitting: Occupational Therapy

## 2019-04-19 ENCOUNTER — Encounter: Payer: Self-pay | Admitting: Occupational Therapy

## 2019-04-19 DIAGNOSIS — M6281 Muscle weakness (generalized): Secondary | ICD-10-CM

## 2019-04-19 DIAGNOSIS — R2681 Unsteadiness on feet: Secondary | ICD-10-CM | POA: Diagnosis not present

## 2019-04-19 DIAGNOSIS — R278 Other lack of coordination: Secondary | ICD-10-CM

## 2019-04-19 NOTE — Therapy (Signed)
[place] MAIN [facility] SERVICES [address] [place], [place], [address] Phone: [phone]   Fax:  [phone]  Occupational Therapy Treatment  Patient Details  Name: Blake Peterson MRN: [id number] Date of Birth: 1941-03-28 No data recorded  Encounter Date: 04/19/2019  OT End of Session - 04/19/19 1023    Visit Number  4    Number of Visits  24    Date for OT Re-Evaluation  07/03/19    OT Start Time  6962    OT Stop Time  1100    OT Time Calculation (min)  45 min    Activity Tolerance  Patient tolerated treatment well    Behavior During Therapy  [facility] for tasks assessed/performed       Past Medical History:  Diagnosis Date  . Basal cell carcinoma 12/2016   R anterior tibia  . Cataract   . Diverticulosis of colon   . History of colonic polyps   . HLD (hyperlipidemia)   . HTN (hypertension)   . Hypertensive retinopathy of both eyes, grade 1    [name]  . 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 Prediabetes   . 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 CATARACT EXTRACTION, BILATERAL  06/2017  . COLONOSCOPY  11/2012   mild-mod diverticulosis, int hem, rec rpt 5 yrs Fuller Plan)  . COLONOSCOPY  01/2018   2 TA, mod diverticulosis, rpt 5 yrs Fuller Plan)  . POLYPECTOMY    . UMBILICAL HERNIA REPAIR  01/07/04    There were no vitals filed for this visit.  Subjective Assessment - 04/19/19 1022    Subjective   Pt. reports that he is improving.    Patient is accompanied by:  Family member    Patient Stated Goals  Patient would like to be as independent as possible and help his wife more at home.    Currently in Pain?  No/denies      OT TREATMENT    Neuro muscular re-education:  Pt. worked on grasping 1" resistive cubes with his left hand alternating thumb opposition to the tip of the 2nd through 5th digits while the board is placed at a vertical angle. Pt. worked on pressing the cubes back into  place while alternating isolated 2nd through 5th digit extension. Pt. worked on grasping, and flipping 2" large pegs on the [employer] placed at a tabletop surface.  Pt. worked on manipulating nuts, and bolts with bilateral hands.   Therapeutic Exercise:  Pt. performed LUE gross gripping with grip strengthener. Pt. worked on sustaining grip while grasping pegs and reaching at various heights. Gripper was placed in the resistive slot with the white resistive spring.Pt. worked on green thearputty ex. For hand strengthening. Exercises included: gross gripping, gross digit extension, thumb abduction, lateral, and 3pt. Pinch strengthening, digit abduction, and thumb opposition. HEP was upgraded to green theraputty, which pt. toelrated well.                          OT Education - 04/19/19 1023    Education Details  [facility] skills    Person(s) Educated  Patient    Methods  Explanation    Comprehension  Verbalized understanding       OT Short Term Goals - 04/17/19 1036      OT SHORT TERM GOAL #1   Title  w  OT Long Term Goals - 04/12/19 0840      OT LONG TERM GOAL #1   Title  Patient will complete light homemaking tasks with modified independence using joint protection principles.    Baseline  unable at eval    Time  12    Period  Weeks    Status  New    Target Date  07/03/19      OT LONG TERM GOAL #2   Title  Patient will demonstrate light meal preparation with modified independence.    Baseline  unable at eval    Time  6    Period  Weeks    Status  New    Target Date  05/22/19      OT LONG TERM GOAL #3   Title  Patient will demonstrate cutting fruits and vegetables with modified techniques and joint protection principles as well as adaptive equipment as needed.    Baseline  unable at eval    Time  12    Period  Weeks    Status  New    Target Date  07/03/19      OT LONG TERM GOAL #4   Title  Patient will demonstrate hand writing for name  address and phone number with 90% legibility    Baseline  decreased legibility at eval    Time  12    Period  Weeks    Status  New    Target Date  07/03/19      OT LONG TERM GOAL #5   Title  Patient will demonstrate improved grip strength by 10 pounds to open jars and containers and to grip steering wheel.    Baseline  decreased at eval    Time  12    Period  Weeks    Status  New    Target Date  07/03/19      Long Term Additional Goals   Additional Long Term Goals  Yes      OT LONG TERM GOAL #6   Title  Patient will complete donning and doffing socks with modified independence.    Baseline  increased difficulty at eval    Time  6    Period  Weeks    Status  New    Target Date  05/22/19            Plan - 04/19/19 1025    Clinical Impression Statement Pt. is making progess overall, and is now able to open a bottletop that has a tight cap. Pt. reported using his left hand  while stabilizing it with the right hand. Pt. was able to sweep the floor, and put the dishes away at home this week. Pt. reports that he does his HEP with the theraputty daily. Pt. conitnues to work on improving Left hand motor control, and [facility] skills in order to improve LUE functioning during ADL, and IADL tasks.    OT Occupational Profile and History  Detailed Assessment- Review of Records and additional review of physical, cognitive, psychosocial history related to current functional performance    Occupational performance deficits (Please refer to evaluation for details):  ADL's;IADL's;Social Participation    Body Structure / Function / Physical Skills  ADL;Coordination;UE functional use;Balance;Decreased knowledge of use of DME;Flexibility;IADL;Pain;Dexterity;FMC;Strength;Edema;ROM    Psychosocial Skills  Routines and Behaviors;Environmental  Adaptations;Habits    Rehab Potential  Good    Clinical Decision Making  Limited treatment options, no task modification necessary    Comorbidities Affecting  Occupational Performance:  May have comorbidities impacting occupational performance    Modification or Assistance to Complete Evaluation   No modification of tasks or assist necessary to complete eval    OT Frequency  2x / week    OT Duration  12 weeks    OT Treatment/Interventions  Self-care/ADL training;Therapeutic exercise;Moist Heat;Neuromuscular education;Splinting;Patient/family education;Therapeutic activities;Balance training;Cryotherapy;Contrast Bath;DME and/or AE instruction;Manual Therapy    Consulted and Agree with Plan of Care  Patient       Patient will benefit from skilled therapeutic intervention in order to improve the following deficits and impairments:   Body Structure / Function / Physical Skills: ADL, Coordination, UE functional use, Balance, Decreased knowledge of use of DME, Flexibility, IADL, Pain, Dexterity, FMC, Strength, Edema, ROM   Psychosocial Skills: Routines and Behaviors, Environmental  Adaptations, Habits   Visit Diagnosis: 1. Muscle weakness (generalized)   2. Other lack of coordination       Problem List Patient Active Problem List   Diagnosis Date Noted  . Weakness of both lower extremities 02/24/2019  . Low energy 02/24/2019  . Seropositive rheumatoid arthritis ([place]) 01/04/2019  . High risk medication use 01/04/2019  . Current chronic use of systemic steroids 12/21/2018  . Lower extremity edema 12/21/2018  . 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 BPH (benign prostatic hyperplasia) 12/13/2018  . Thoracic aorta atherosclerosis ([place]) 06/15/2018  . Pedal edema 05/10/2018  . 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 Left foot pain 11/15/2015  . 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 Hypertensive retinopathy of both eyes, grade 1   . Health maintenance examination 08/10/2014  . Advanced care planning/counseling discussion 08/10/2014  . Bilateral hearing loss due to cerumen impaction 08/10/2014  . Essential tremor 09/09/2012  . Medicare  annual wellness visit, subsequent 08/07/2011  . History of colonic polyps   . Diverticulosis of colon   . HTN (hypertension)   . HLD (hyperlipidemia)   . Controlled type 2 diabetes mellitus without complication, without long-term current use of insulin ([place]) 08/11/2010  . Vitamin D deficiency 08/07/2010  . 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 Elevated PSA 02/16/2007    Harrel Carina, MS, OTR/L 04/19/2019, 10:43 AM  [place] MAIN [facility] SERVICES [address] [place], [place], [address] Phone: [phone]   Fax:  [phone]  Name: KASRA MELVIN MRN: [id number] Date of Birth: 1941-05-25

## 2019-04-19 NOTE — Telephone Encounter (Signed)
Spoke with pt asking if he is taking prednisone 1 mg tab.  Says the still takes prednisone but a 5 mg tab PRN, per rheumatology.   Updated pt's med list.  Denied refill.

## 2019-04-19 NOTE — Therapy (Signed)
[place] MAIN [facility] SERVICES [address] [place], [place], [address] Phone: [phone]   Fax:  [phone]  Physical Therapy Treatment  Patient Details  Name: Blake Peterson MRN: [id number] Date of Birth: [birth year] Referring Provider (PT): Dr. Annalee Genta   Encounter Date: 04/19/2019  PT End of Session - 04/19/19 0948    Visit Number  7    Number of Visits  25    Date for PT Re-Evaluation  06/19/19    Authorization Type  eval 03/27/19    PT Start Time  0930    PT Stop Time  1015    PT Time Calculation (min)  45 min    Equipment Utilized During Treatment  Gait belt    Activity Tolerance  Patient tolerated treatment well    Behavior During Therapy  [facility] for tasks assessed/performed       Past Medical History:  Diagnosis Date  . Basal cell carcinoma 12/2016   R anterior tibia  . Cataract   . Diverticulosis of colon   . History of colonic polyps   . HLD (hyperlipidemia)   . HTN (hypertension)   . Hypertensive retinopathy of both eyes, grade 1    [name]  . 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 Prediabetes   . 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 CATARACT EXTRACTION, BILATERAL  06/2017  . COLONOSCOPY  11/2012   mild-mod diverticulosis, int hem, rec rpt 5 yrs Fuller Plan)  . COLONOSCOPY  01/2018   2 TA, mod diverticulosis, rpt 5 yrs Fuller Plan)  . POLYPECTOMY    . UMBILICAL HERNIA REPAIR  01/07/04    There were no vitals filed for this visit.  Subjective Assessment - 04/19/19 0934    Subjective  Pt reports that he is doing well today.  He reports some soreness in his feet after last session, but no soreness today.  He reports that he has noticed a decrease in hamstring pain with sitting in the car, and feels that the hamstring stretch is helping. No new questions or comments at this time.    Pertinent History  Pt reports issues with generalized pain in multiple joints of the body  including ankles, knees, hips, and hands. Pt reports that he had plain film radiographs of his shoulders and feet. 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 Pt states that he has started having difficulty getting out of bed due to weakness. He has been struggling with is balance as well. He started using a quad cane for ambulation to assist with his balance Pt reports that he has a fear of falling but denies any falls in the last 12 years. Numbness on the back of LLE for multiple months. Pt reports numbness in the bottom of both feet.    Patient Stated Goals  Improve balance    Currently in Pain?  No/denies    Pain Onset  --       Treatment:    There-Ex     PNF stretching on L hamstring   Supine sciatic nerve glides     Piriformis stretching   Seated Sciatic nerve glidesx10     Supine bridges 2x15 with 3 sec hold   STSs 2x12 without UE support   Cross over steps onto stairs 2x10   Step ups on 6" box 2x10 bilaterally     Trunk rotations in chair  Neuromuscular Re-education     Forward walking on red uneven surface mat   Gait with Nordic poles 2x71ft    Gait with increased arm swing 5x30'        Pt demonstrates excellent motivation throughout today's session.  He reports that he has been compliant with his HEP and that his hamstring pain has decreased when sitting in the car since last visit.   He responded well to using the Nordic poles to increase his arm swing with gait, but still displays decreased trunk rotation.   A slight increase in trunk rotation was noted when cued to increase step length, but is still limited.  Pt educated throughout session about proper posture and technique with exercises. Improved exercise technique, movement at target joints, use of target muscles after min to mod verbal, visual, tactile cues.  Pt will benefit from continued skilled PT services to improve his static and dynamic balance, as well as improving  his strength and technique.      PT Short Term Goals - 03/29/19 1235      PT SHORT TERM GOAL #1   Title  Pt will be independent with HEP in order to improve strength and balance in order to decrease fall risk and improve function at home.    Time  6    Period  Weeks    Status  New    Target Date  05/08/19        PT Long Term Goals - 03/29/19 1235      PT LONG TERM GOAL #1   Title  Pt will improve BERG by at least 3 points in order to demonstrate clinically significant improvement in balance.    Baseline  03/27/19: 45/56    Time  12    Period  Weeks    Status  New    Target Date  06/21/19      PT LONG TERM GOAL #2   Title  Pt will decrease TUG to below 14 seconds/decrease in order to demonstrate decreased fall risk.    Baseline  03/27/19: 15.6s    Time  12    Period  Weeks    Status  New    Target Date  06/21/19      PT LONG TERM GOAL #3   Title  Pt will improve ABC by at least 13% in order to demonstrate clinically significant improvement in balance confidence.    Baseline  03/27/19: 41.25%    Time  12    Period  Weeks    Status  New    Target Date  06/19/19      PT LONG TERM GOAL #4   Title  Pt will decrease 5TSTS by at least 3 seconds in order to demonstrate clinically significant improvement in LE strength.    Baseline  03/27/19: 18.1s    Time  12    Period  Weeks    Status  New    Target Date  06/19/19      PT LONG TERM GOAL #5   Title  Pt will increase self-selected 10MWT by at least 0.13 m/s in order to demonstrate clinically significant improvement in community ambulation.    Baseline  03/27/19: Self-selected: 16.5s = 0.61 m/s; Fastest: 10.0s = 1.0 m/s    Time  12    Period  Weeks    Status  New    Target Date  06/19/19            Plan - 04/19/19  1200    Clinical Impression Statement  Pt demonstrates excellent motivation throughout today's session.  He reports that he has been compliant with his HEP and that his hamstring pain has decreased when sitting  in the car since last visit.   He responded well to using the Nordic poles to increase his arm swing with gait, but still displays decreased trunk rotation.   A slight increase in trunk rotation was noted when cued to increase step length, but is still limited.  Pt educated throughout session about proper posture and technique with exercises. Improved exercise technique, movement at target joints, use of target muscles after min to mod verbal, visual, tactile cues.  Pt will benefit from continued skilled PT services to improve his static and dynamic balance, as well as improving his strength and technique.    Personal Factors and Comorbidities  Comorbidity 1;Age;Comorbidity 2    Comorbidities  HTN, polyarthralgia    Examination-Activity Limitations  Locomotion Level;Squat;Stairs;Transfers    Examination-Participation Restrictions  Cleaning;Community Activity;Laundry;Shop;Yard Work    [employer]  Evolving/Moderate complexity    Rehab Potential  Good    PT Frequency  2x / week    PT Duration  12 weeks    PT Treatment/Interventions  ADLs/Self Care Home Management;Aquatic Therapy;Biofeedback;Canalith Repostioning;Cryotherapy;Electrical Stimulation;Moist Heat;Traction;Ultrasound;DME Instruction;Gait training    PT Next Visit Plan  trunk rotation and arm swing with gait; progress balance and strengthening; continue to address hamstring    PT Home Exercise Plan  Medbridge Access Code: [facility]; [email or web address]; [email or web address]    Consulted and Agree with Plan of Care  Patient       Patient will benefit from skilled therapeutic intervention in order to improve the following deficits and impairments:  Abnormal gait, Decreased balance, Decreased endurance, Decreased mobility, Decreased strength, Difficulty walking  Visit Diagnosis: 1. Muscle weakness (generalized)   2. Unsteadiness on feet        Problem List Patient Active Problem List   Diagnosis Date Noted  . Weakness of both lower  extremities 02/24/2019  . Low energy 02/24/2019  . 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 High risk medication use 01/04/2019  . Current chronic use of systemic steroids 12/21/2018  . Lower extremity edema 12/21/2018  . 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 BPH (benign prostatic hyperplasia) 12/13/2018  . Thoracic aorta atherosclerosis ([place]) 06/15/2018  . Pedal edema 05/10/2018  . 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 Left foot pain 11/15/2015  . 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 Hypertensive retinopathy of both eyes, grade 1   . Health maintenance examination 08/10/2014  . Advanced care planning/counseling discussion 08/10/2014  . Bilateral hearing loss due to cerumen impaction 08/10/2014  . Essential tremor 09/09/2012  . Medicare annual wellness visit, subsequent 08/07/2011  . History of colonic polyps   . Diverticulosis of colon   . HTN (hypertension)   . HLD (hyperlipidemia)   . Controlled type 2 diabetes mellitus without complication, without long-term current use of insulin ([place]) 08/11/2010  . Vitamin D deficiency 08/07/2010  . 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 Elevated PSA 02/16/2007     This entire session was performed under direct supervision and direction of a licensed therapist/therapist assistant . I have personally read, edited and approve of the note as written.   Lutricia Horsfall, SPT Phillips Grout PT, DPT, GCS  Huprich,Jason 04/19/2019, 2:05 PM  [place] MAIN [facility] SERVICES [address] [place], [place], [address] Phone:  [phone]   Fax:  [phone]  Name: IANN RODIER MRN: [id number] Date of Birth: 08/03/1941

## 2019-04-24 ENCOUNTER — Ambulatory Visit: Payer: PPO | Admitting: Occupational Therapy

## 2019-04-24 ENCOUNTER — Inpatient Hospital Stay: Payer: PPO

## 2019-04-24 ENCOUNTER — Encounter: Payer: Self-pay | Admitting: Emergency Medicine

## 2019-04-24 ENCOUNTER — Other Ambulatory Visit: Payer: Self-pay

## 2019-04-24 ENCOUNTER — Ambulatory Visit: Payer: PPO | Attending: Internal Medicine

## 2019-04-24 ENCOUNTER — Inpatient Hospital Stay
Admission: EM | Admit: 2019-04-24 | Discharge: 2019-04-25 | DRG: 312 | Disposition: A | Discharge status: Home / Self Care | Payer: PPO | Source: Ambulatory Visit | Attending: Internal Medicine | Admitting: Internal Medicine

## 2019-04-24 ENCOUNTER — Emergency Department: Payer: PPO

## 2019-04-24 VITALS — BP 92/75 | HR 63

## 2019-04-24 DIAGNOSIS — Z87891 Personal history of nicotine dependence: Secondary | ICD-10-CM | POA: Diagnosis not present

## 2019-04-24 DIAGNOSIS — M6281 Muscle weakness (generalized): Secondary | ICD-10-CM | POA: Insufficient documentation

## 2019-04-24 DIAGNOSIS — R55 Syncope and collapse: Secondary | ICD-10-CM | POA: Diagnosis not present

## 2019-04-24 DIAGNOSIS — R001 Bradycardia, unspecified: Secondary | ICD-10-CM | POA: Diagnosis present

## 2019-04-24 DIAGNOSIS — Z8249 Family history of ischemic heart disease and other diseases of the circulatory system: Secondary | ICD-10-CM

## 2019-04-24 DIAGNOSIS — R0602 Shortness of breath: Secondary | ICD-10-CM | POA: Diagnosis not present

## 2019-04-24 DIAGNOSIS — H35033 Hypertensive retinopathy, bilateral: Secondary | ICD-10-CM | POA: Diagnosis not present

## 2019-04-24 DIAGNOSIS — Z7982 Long term (current) use of aspirin: Secondary | ICD-10-CM | POA: Diagnosis not present

## 2019-04-24 DIAGNOSIS — D72829 Elevated white blood cell count, unspecified: Secondary | ICD-10-CM | POA: Diagnosis present

## 2019-04-24 DIAGNOSIS — Z800 Family history of malignant neoplasm of digestive organs: Secondary | ICD-10-CM

## 2019-04-24 DIAGNOSIS — I517 Cardiomegaly: Secondary | ICD-10-CM | POA: Diagnosis not present

## 2019-04-24 DIAGNOSIS — I119 Hypertensive heart disease without heart failure: Secondary | ICD-10-CM | POA: Diagnosis present

## 2019-04-24 DIAGNOSIS — E785 Hyperlipidemia, unspecified: Secondary | ICD-10-CM | POA: Diagnosis present

## 2019-04-24 DIAGNOSIS — I959 Hypotension, unspecified: Secondary | ICD-10-CM | POA: Diagnosis present

## 2019-04-24 DIAGNOSIS — Z20828 Contact with and (suspected) exposure to other viral communicable diseases: Secondary | ICD-10-CM | POA: Diagnosis not present

## 2019-04-24 DIAGNOSIS — Z7984 Long term (current) use of oral hypoglycemic drugs: Secondary | ICD-10-CM

## 2019-04-24 DIAGNOSIS — I6523 Occlusion and stenosis of bilateral carotid arteries: Secondary | ICD-10-CM | POA: Diagnosis not present

## 2019-04-24 DIAGNOSIS — I10 Essential (primary) hypertension: Secondary | ICD-10-CM | POA: Diagnosis not present

## 2019-04-24 DIAGNOSIS — R278 Other lack of coordination: Secondary | ICD-10-CM | POA: Insufficient documentation

## 2019-04-24 DIAGNOSIS — R2681 Unsteadiness on feet: Secondary | ICD-10-CM | POA: Insufficient documentation

## 2019-04-24 DIAGNOSIS — E119 Type 2 diabetes mellitus without complications: Secondary | ICD-10-CM | POA: Diagnosis not present

## 2019-04-24 LAB — TROPONIN I (HIGH SENSITIVITY)
Troponin I (High Sensitivity): 15 ng/L (ref <18)
Troponin I (High Sensitivity): 16 ng/L (ref <18)

## 2019-04-24 LAB — CBC WITH DIFFERENTIAL/PLATELET
Abs Immature Granulocytes: 0.26 10*3/uL — ABNORMAL HIGH (ref 0.00–0.07)
Basophils Absolute: 0.1 10*3/uL (ref 0.0–0.1)
Basophils Relative: 1 %
Eosinophils Absolute: 0.1 10*3/uL (ref 0.0–0.5)
Eosinophils Relative: 0 %
HCT: 43.5 % (ref 39.0–52.0)
Hemoglobin: 14.4 g/dL (ref 13.0–17.0)
Immature Granulocytes: 2 %
Lymphocytes Relative: 15 %
Lymphs Abs: 2.3 10*3/uL (ref 0.7–4.0)
MCH: 30.8 pg (ref 26.0–34.0)
MCHC: 33.1 g/dL (ref 30.0–36.0)
MCV: 92.9 fL (ref 80.0–100.0)
Monocytes Absolute: 1.3 10*3/uL — ABNORMAL HIGH (ref 0.1–1.0)
Monocytes Relative: 8 %
Neutro Abs: 11.1 10*3/uL — ABNORMAL HIGH (ref 1.7–7.7)
Neutrophils Relative %: 74 %
Platelets: 334 10*3/uL (ref 150–400)
RBC: 4.68 MIL/uL (ref 4.22–5.81)
RDW: 14.6 % (ref 11.5–15.5)
WBC: 15.1 10*3/uL — ABNORMAL HIGH (ref 4.0–10.5)
nRBC: 0 % (ref 0.0–0.2)

## 2019-04-24 LAB — COMPREHENSIVE METABOLIC PANEL
ALT: 22 U/L (ref 0–44)
AST: 26 U/L (ref 15–41)
Albumin: 3.5 g/dL (ref 3.5–5.0)
Alkaline Phosphatase: 74 U/L (ref 38–126)
Anion gap: 13 (ref 5–15)
BUN: 17 mg/dL (ref 8–23)
CO2: 23 mmol/L (ref 22–32)
Calcium: 9.7 mg/dL (ref 8.9–10.3)
Chloride: 96 mmol/L — ABNORMAL LOW (ref 98–111)
Creatinine, Ser: 0.88 mg/dL (ref 0.61–1.24)
GFR calc Af Amer: >60 mL/min (ref >60)
GFR calc non Af Amer: >60 mL/min (ref >60)
Glucose, Bld: 151 mg/dL — ABNORMAL HIGH (ref 70–99)
Potassium: 3.6 mmol/L (ref 3.5–5.1)
Sodium: 132 mmol/L — ABNORMAL LOW (ref 135–145)
Total Bilirubin: 0.9 mg/dL (ref 0.3–1.2)
Total Protein: 6.8 g/dL (ref 6.5–8.1)

## 2019-04-24 LAB — TSH: TSH: 1.673 u[IU]/mL (ref 0.350–4.500)

## 2019-04-24 LAB — GLUCOSE, CAPILLARY
Glucose-Capillary: 156 mg/dL — ABNORMAL HIGH (ref 70–99)
Glucose-Capillary: 184 mg/dL — ABNORMAL HIGH (ref 70–99)
Glucose-Capillary: 143 mg/dL — ABNORMAL HIGH (ref 70–99)

## 2019-04-24 LAB — SARS CORONAVIRUS 2 BY RT PCR (HOSPITAL ORDER, PERFORMED IN ~~LOC~~ HOSPITAL LAB): SARS Coronavirus 2: NEGATIVE

## 2019-04-24 MED ORDER — METHOTREXATE 2.5 MG PO TABS: 2.5000 mg | ORAL_TABLET | ORAL | Status: DC

## 2019-04-24 MED ORDER — POTASSIUM CHLORIDE CRYS ER 20 MEQ PO TBCR
10.0000 meq | EXTENDED_RELEASE_TABLET | Freq: Every day | ORAL | Status: DC
  Administered 2019-04-25: 10 meq via ORAL
  Filled 2019-04-24: qty 1

## 2019-04-24 MED ORDER — ATORVASTATIN CALCIUM 20 MG PO TABS
80.0000 mg | ORAL_TABLET | Freq: Every day | ORAL | Status: DC
  Administered 2019-04-24: 80 mg via ORAL
  Filled 2019-04-24: qty 4

## 2019-04-24 MED ORDER — ENOXAPARIN SODIUM 40 MG/0.4ML ~~LOC~~ SOLN
40.0000 mg | SUBCUTANEOUS | Status: DC
  Administered 2019-04-24: 40 mg via SUBCUTANEOUS
  Filled 2019-04-24: qty 0.4

## 2019-04-24 MED ORDER — VITAMIN D3 25 MCG (1000 UNIT) PO TABS
1000.0000 [IU] | ORAL_TABLET | Freq: Every day | ORAL | Status: DC
  Administered 2019-04-25: 1000 [IU] via ORAL
  Filled 2019-04-24 (×2): qty 1

## 2019-04-24 MED ORDER — SODIUM CHLORIDE 0.9 % IV SOLN
1000.0000 mL | Freq: Once | INTRAVENOUS | Status: AC
  Administered 2019-04-24: 12:00:00 1000 mL via INTRAVENOUS

## 2019-04-24 MED ORDER — SODIUM CHLORIDE 0.9% FLUSH
3.0000 mL | Freq: Two times a day (BID) | INTRAVENOUS | Status: DC
  Administered 2019-04-24 – 2019-04-25 (×2): 3 mL via INTRAVENOUS

## 2019-04-24 MED ORDER — ENALAPRIL MALEATE 10 MG PO TABS
10.0000 mg | ORAL_TABLET | Freq: Two times a day (BID) | ORAL | Status: DC
  Administered 2019-04-24 – 2019-04-25 (×2): 10 mg via ORAL
  Filled 2019-04-24 (×3): qty 1

## 2019-04-24 MED ORDER — ACETAMINOPHEN 500 MG PO TABS: 500.0000 mg | ORAL_TABLET | Freq: Four times a day (QID) | ORAL | Status: DC | PRN

## 2019-04-24 MED ORDER — INSULIN ASPART 100 UNIT/ML ~~LOC~~ SOLN
0.0000 [IU] | Freq: Three times a day (TID) | SUBCUTANEOUS | Status: DC
  Administered 2019-04-24: 2 [IU] via SUBCUTANEOUS
  Administered 2019-04-25: 1 [IU] via SUBCUTANEOUS
  Filled 2019-04-24 (×2): qty 1

## 2019-04-24 MED ORDER — ASPIRIN EC 81 MG PO TBEC
81.0000 mg | DELAYED_RELEASE_TABLET | Freq: Every day | ORAL | Status: DC
  Administered 2019-04-25: 08:00:00 81 mg via ORAL
  Filled 2019-04-24: qty 1

## 2019-04-24 MED ORDER — INSULIN ASPART 100 UNIT/ML ~~LOC~~ SOLN: 0.0000 [IU] | Freq: Every day | SUBCUTANEOUS | Status: DC

## 2019-04-24 MED ORDER — METHOTREXATE 2.5 MG PO TABS: 20.0000 mg | ORAL_TABLET | ORAL | Status: DC

## 2019-04-24 MED ORDER — IOHEXOL 350 MG/ML SOLN
75.0000 mL | Freq: Once | INTRAVENOUS | Status: AC | PRN
  Administered 2019-04-24: 14:00:00 75 mL via INTRAVENOUS

## 2019-04-24 MED ORDER — FOLIC ACID 1 MG PO TABS
1.0000 mg | ORAL_TABLET | Freq: Every day | ORAL | Status: DC
  Administered 2019-04-25: 1 mg via ORAL
  Filled 2019-04-24: qty 1

## 2019-04-24 NOTE — ED Notes (Signed)
ED TO INPATIENT HANDOFF REPORT  ED Nurse Name and Phone #: Welford Roche  S Name/Age/Gender Blake Peterson 78 y.o. male Room/Bed: ED17A/ED17A  Code Status   Code Status: Not on file  Home/SNF/Other Home Patient oriented to: self, place, time and situation Is this baseline? Yes   Triage Complete: Triage complete  Chief Complaint syncopal episode  Triage Note Pt arrives from rehabilitation at [facility]. Pt has syncopal episode and had a drop in HR and BP. In ER triage pt a& o x 4 with no complaints of pain. Pt remains hypotensive.    Allergies Allergies  Allergen Reactions  . Naprosyn [Naproxen] Swelling    Fingers and ankle swelling    Level of Care/Admitting Diagnosis ED Disposition    ED Disposition Condition Comment   Admit  The patient appears reasonably stabilized for admission considering the current resources, flow, and capabilities available in the ED at this time, and I doubt any other [facility] requiring further screening and/or treatment in the ED prior to admission is  present.       B Medical/Surgery History Past Medical History:  Diagnosis Date  . Basal cell carcinoma 12/2016   R anterior tibia  . Cataract   . Diverticulosis of colon   . History of colonic polyps   . HLD (hyperlipidemia)   . HTN (hypertension)   . Hypertensive retinopathy of both eyes, grade 1    [name]  . 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 Prediabetes   . 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 CATARACT EXTRACTION, BILATERAL  06/2017  . COLONOSCOPY  11/2012   mild-mod diverticulosis, int hem, rec rpt 5 yrs Fuller Plan)  . COLONOSCOPY  01/2018   2 TA, mod diverticulosis, rpt 5 yrs Fuller Plan)  . POLYPECTOMY    . UMBILICAL HERNIA REPAIR  01/07/04     A IV Location/Drains/Wounds Patient Lines/Drains/Airways Status   Active Line/Drains/Airways    Name:   Placement date:   Placement time:   Site:   Days:   Peripheral IV 04/24/19 Left Arm    04/24/19    1131    Arm   less than 1   Peripheral IV 04/24/19 Left Antecubital   04/24/19    1134    Antecubital   less than 1          Intake/Output Last 24 hours No intake or output data in the 24 hours ending 04/24/19 1317  Labs/Imaging Results for orders placed or performed during the hospital encounter of 04/24/19 (from the past 48 hour(s))  CBC with Differential     Status: Abnormal   Collection Time: 04/24/19 11:36 AM  Result Value Ref Range   WBC 15.1 (H) 4.0 - 10.5 K/uL   RBC 4.68 4.22 - 5.81 MIL/uL   Hemoglobin 14.4 13.0 - 17.0 g/dL   HCT 43.5 39.0 - 52.0 %   MCV 92.9 80.0 - 100.0 fL   MCH 30.8 26.0 - 34.0 pg   MCHC 33.1 30.0 - 36.0 g/dL   RDW 14.6 11.5 - 15.5 %   Platelets 334 150 - 400 K/uL   nRBC 0.0 0.0 - 0.2 %   Neutrophils Relative % 74 %   Neutro Abs 11.1 (H) 1.7 - 7.7 K/uL   Lymphocytes Relative 15 %   Lymphs Abs 2.3 0.7 - 4.0 K/uL   Monocytes Relative 8 %   Monocytes Absolute 1.3 (H) 0.1 - 1.0 K/uL  Eosinophils Relative 0 %   Eosinophils Absolute 0.1 0.0 - 0.5 K/uL   Basophils Relative 1 %   Basophils Absolute 0.1 0.0 - 0.1 K/uL   Immature Granulocytes 2 %   Abs Immature Granulocytes 0.26 (H) 0.00 - 0.07 K/uL    Comment: Performed at [facility], [address] [place], [place] [address]  Comprehensive metabolic panel     Status: Abnormal   Collection Time: 04/24/19 11:36 AM  Result Value Ref Range   Sodium 132 (L) 135 - 145 mmol/L   Potassium 3.6 3.5 - 5.1 mmol/L   Chloride 96 (L) 98 - 111 mmol/L   CO2 23 22 - 32 mmol/L   Glucose, Bld 151 (H) 70 - 99 mg/dL   BUN 17 8 - 23 mg/dL   Creatinine, Ser 0.88 0.61 - 1.24 mg/dL   Calcium 9.7 8.9 - 10.3 mg/dL   Total Protein 6.8 6.5 - 8.1 g/dL   Albumin 3.5 3.5 - 5.0 g/dL   AST 26 15 - 41 U/L   ALT 22 0 - 44 U/L   Alkaline Phosphatase 74 38 - 126 U/L   Total Bilirubin 0.9 0.3 - 1.2 mg/dL   GFR calc non Af Amer >60 >60 mL/min   GFR calc Af Amer >60 >60 mL/min   Anion gap 13 5 - 15     Comment: Performed at [facility], [place], [place] [address]  Troponin I (High Sensitivity)     Status: None   Collection Time: 04/24/19 11:41 AM  Result Value Ref Range   Troponin I (High Sensitivity) 15 <18 ng/L    Comment: (NOTE) Elevated high sensitivity troponin I (hsTnI) values and significant  changes across serial measurements may suggest ACS but many other  chronic and acute conditions are known to elevate hsTnI results.  Refer to the "Links" section for chest pain algorithms and additional  guidance. Performed at [facility], [address] [place], [place] [address]    No results found.  Pending Labs [employer] (From admission, onward)    Start     Ordered   04/24/19 1312  SARS Coronavirus 2 [facility] order, Performed in [facility] hospital lab) Nasopharyngeal Nasopharyngeal Swab  (Symptomatic/High Risk of Exposure Patients Labs with Precautions)  Once,   STAT    Question Answer Comment  Is this test for diagnosis or screening Diagnosis of ill patient   Symptomatic for COVID-19 as defined by CDC Yes   Date of Symptom Onset 04/24/2019   Hospitalized for COVID-19 Yes   Admitted to ICU for COVID-19 No   Previously tested for COVID-19 No   Resident in a congregate (group) care setting No   Employed in healthcare setting No      04/24/19 1311          Vitals/Pain Today's Vitals   04/24/19 1134 04/24/19 1135 04/24/19 1230 04/24/19 1245  BP: (!) 88/61  109/70 106/66  Pulse: 67  (!) 58 60  Resp: 18  20 17   Temp: 98.1 F (36.7 C)     TempSrc: Oral     SpO2: 95%  100% 100%  Weight:  95.3 kg    Height:  5\' 8"  (1.727 m)    PainSc:  0-No pain      Isolation Precautions No active isolations  Medications Medications  0.9 %  sodium chloride infusion (1,000 mLs Intravenous New Bag/Peterson 04/24/19 1149)    Mobility walks with device Low fall risk  Focused Assessments Cardiac Assessment Handoff:  Cardiac Rhythm:  Sinus bradycardia Lab Results  Component Value Date   CKTOTAL 55 02/23/2019   No results found for: DDIMER Does the Patient currently have chest pain? No     R Recommendations: See Admitting Provider Note  Report Peterson to:   Additional Notes:

## 2019-04-24 NOTE — Progress Notes (Signed)
   04/24/19 1800  Clinical Encounter Type  Visited With Patient  Visit Type Follow-up  Referral From Chaplain  Consult/Referral To Chaplain  Spiritual Encounters  Spiritual Needs Other (Comment)  Chaplain was rounding and stop to follow up with patient from ER. Patien'st wife was gone to get clothes together for patient's son to bring. Patient was confident in the care team and the good care they were giving him, and now his heart was being monitor, to make sure everything was ok. Patient was glad the Chaplain followed up with him.

## 2019-04-24 NOTE — ED Notes (Signed)
Attempted to call report x 1  

## 2019-04-24 NOTE — H&P (Addendum)
[facility] NAME: Blake Peterson    MR#:  [id number]  DATE OF BIRTH:  May 03, 1941  DATE OF ADMISSION:  04/24/2019  PRIMARY CARE PHYSICIAN: Ria Bush, MD   REQUESTING/REFERRING PHYSICIAN: Loraine Leriche MD  CHIEF COMPLAINT:   Chief Complaint  Patient presents with   Loss of Consciousness    HISTORY OF PRESENT ILLNESS:   78 year old male with past medical history of basal cell carcinoma, hyperlipidemia, hypertension, hypertensive retinopathy of both eyes, and diabetes mellitus type 2 presenting to the ED from rehabilitation at [facility] with syncopal episode.  Per PT report, patient was in PT session performing third round of gait training in the hallway when he started feeling very lightheaded and needed to sit.  After a few seconds of sitting in the chair he lost consciousness and began snoring and shaking slightly.  Rapid response was called and patient was lowered to the ground and feet elevated, at that point he regained consciousness.  He was noted to have lost control of his bladder and per PT he smelled like he may have lost his bowel but was not checked.  Initial blood pressure was normal however when rechecked was found to be in the 70W systolic with a heart rate dropping from 60 to 30s beats per minute at that time CODE BLUE was called but by the time the code team arrived heart rate had recovered but BP still remained low.  He was transported to the ED by code/rapid response team.  On arrival to the ED, he was afebrile with blood pressure 88/61 mm Hg and pulse rate 67 beats/min. There were no focal neurological deficits; he was alert and oriented x4, and he did not demonstrate any memory deficits.  He however did not recall the events prior to or following the episode.  Initial labs revealed glucose of 156, WBC 15.1, sodium 132, troponin 15.  Chest x-ray showed cardiomegaly with no active cardiopulmonary process. Patient being  admitted under hospitalist service for further management.  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     PAST SURGICAL HISTORY:   Past Surgical History:  Procedure Laterality Date   CATARACT EXTRACTION, BILATERAL  06/2017   COLONOSCOPY  11/2012   mild-mod diverticulosis, int hem, rec rpt 5 yrs [facility])   COLONOSCOPY  01/2018   2 TA, mod diverticulosis, rpt 5 yrs [facility])   POLYPECTOMY     UMBILICAL HERNIA REPAIR  01/07/04    SOCIAL HISTORY:   Social History   Tobacco Use   Smoking status: Former Smoker    Quit date: 11/10/1984    Years since quitting: 34.4   Smokeless tobacco: Never Used  Substance Use Topics   Alcohol use: Yes    Alcohol/week: 2.0 - 3.0 standard drinks    Types: 2 - 3 Glasses of wine per week    FAMILY HISTORY:   Family History  Problem Relation Age of Onset   Hypertension Sister    Colon cancer Sister 51   Colon polyps Sister  Heart attack Father 65   Esophageal cancer Neg Hx    Rectal cancer Neg Hx    Stomach cancer Neg Hx     DRUG ALLERGIES:   Allergies  Allergen Reactions   Naprosyn [Naproxen] Swelling    Fingers and ankle swelling    REVIEW OF SYSTEMS:   Review of Systems  Constitutional: Negative for chills, fever, malaise/fatigue and weight loss.  HENT: Negative for congestion, hearing loss and sore throat.   Eyes: Negative for blurred vision and double vision.  Respiratory: Negative for cough, shortness of breath and wheezing.   Cardiovascular: Negative for chest pain, palpitations, orthopnea and leg swelling.  Gastrointestinal: Negative for abdominal pain, diarrhea,  nausea and vomiting.  Genitourinary: Negative for dysuria and urgency.  Musculoskeletal: Negative for myalgias.  Skin: Negative for rash.  Neurological: Positive for dizziness and loss of consciousness. Negative for sensory change, speech change, focal weakness and headaches.  Psychiatric/Behavioral: Negative for depression. The patient is nervous/anxious.    MEDICATIONS AT HOME:   Prior to Admission medications   Medication Sig Start Date End Date Taking? Authorizing Provider  acetaminophen (TYLENOL) 500 MG tablet Take 1 tablet (500 mg total) by mouth every 6 (six) hours as needed for moderate pain. 03/10/19  Yes Ria Bush, MD  aspirin 81 MG tablet Take 81 mg by mouth daily.     Yes [provider]  atenolol (TENORMIN) 50 MG tablet TAKE 1 TABLET (50 MG TOTAL) BY MOUTH DAILY. 10/13/18  Yes Ria Bush, MD  atorvastatin (LIPITOR) 80 MG tablet TAKE 1 TABLET (80 MG TOTAL) BY MOUTH AT BEDTIME. 10/17/18  Yes Ria Bush, MD  chlorthalidone (HYGROTON) 25 MG tablet TAKE 1 TABLET (25 MG TOTAL) BY MOUTH DAILY. 11/21/18  Yes Ria Bush, MD  cholecalciferol (VITAMIN D) 1000 UNITS tablet Take 1,000 Units by mouth daily.     Yes [provider]  enalapril (VASOTEC) 10 MG tablet TAKE 1 TABLET BY MOUTH TWICE A DAY 04/14/19  Yes Ria Bush, MD  folic acid (FOLVITE) 1 MG tablet Take 1 mg by mouth daily. 04/01/19  Yes [provider]  glimepiride (AMARYL) 1 MG tablet TAKE 1 TABLET (1 MG TOTAL) BY MOUTH DAILY WITH BREAKFAST. 03/01/19  Yes Ria Bush, MD  KLOR-CON M10 10 MEQ tablet TAKE 1 TABLET BY MOUTH EVERY DAY 03/01/19  Yes Ria Bush, MD  methotrexate (RHEUMATREX) 2.5 MG tablet Take 20 mg by mouth once a week. Caution:Chemotherapy. Protect from light.  >  On Thursdays>  8 tablets   Yes [provider]  predniSONE (DELTASONE) 5 MG tablet Take 5 mg by mouth as needed.   Yes [provider]      VITAL SIGNS:  Blood pressure  (!) 121/55, pulse 82, temperature 98.5 F (36.9 C), temperature source Oral, resp. rate 16, height 5\' 8"  (1.727 m), weight 95.8 kg, SpO2 99 %.  PHYSICAL EXAMINATION:   Physical Exam  GENERAL:  78 y.o.-year-old patient lying in the bed with no acute distress.  EYES: Pupils equal, round, reactive to light and accommodation. No scleral icterus. Extraocular muscles intact.  HEENT: Head atraumatic, normocephalic. Oropharynx and nasopharynx clear.  NECK:  Supple, no jugular venous distention. No thyroid enlargement, no tenderness.  LUNGS: Normal breath sounds bilaterally, no wheezing, rales,rhonchi or crepitation. No use of accessory muscles of respiration.  CARDIOVASCULAR: S1, S2 normal. No murmurs, rubs, or gallops.  ABDOMEN: Soft, nontender, nondistended. Bowel sounds present. No organomegaly or mass.  EXTREMITIES: No pedal edema, cyanosis, or clubbing. No  rash or lesions. + pedal pulses MUSCULOSKELETAL: Normal bulk, and power was 5+ grip and elbow, knee, and ankle flexion and extension bilaterally.  NEUROLOGIC:Alert and oriented x 3. CN 2-12 intact. Sensation to light touch and cold stimuli intact bilaterally. Babinski is downgoing. DTR's (biceps, patellar, and achilles) 2+ and symmetric throughout. Gait not tested due to safety concern. PSYCHIATRIC: The patient is alert and oriented x 3.  SKIN: No obvious rash, lesion, or ulcer.   DATA REVIEWED:  LABORATORY PANEL:   CBC Recent Labs  Lab 04/24/19 1136  WBC 15.1*  HGB 14.4  HCT 43.5  PLT 334   ------------------------------------------------------------------------------------------------------------------  Chemistries  Recent Labs  Lab 04/24/19 1136  NA 132*  K 3.6  CL 96*  CO2 23  GLUCOSE 151*  BUN 17  CREATININE 0.88  CALCIUM 9.7  AST 26  ALT 22  ALKPHOS 74  BILITOT 0.9   ------------------------------------------------------------------------------------------------------------------  Cardiac Enzymes No results  for input(s): TROPONINI in the last 168 hours. ------------------------------------------------------------------------------------------------------------------  RADIOLOGY:  Ct Angio Head W Or Wo Contrast  Result Date: 04/24/2019 CLINICAL DATA:  Episode of hypotension and loss of consciousness. Dizziness. EXAM: CT ANGIOGRAPHY HEAD AND NECK TECHNIQUE: Multidetector CT imaging of the head and neck was performed using the standard protocol during bolus administration of intravenous contrast. Multiplanar CT image reconstructions and MIPs were obtained to evaluate the vascular anatomy. Carotid stenosis measurements (when applicable) are obtained utilizing NASCET criteria, using the distal internal carotid diameter as the denominator. CONTRAST:  59mL OMNIPAQUE IOHEXOL 350 MG/ML SOLN COMPARISON:  None. FINDINGS: CT HEAD FINDINGS Brain: Age related atrophy. Mild chronic small-vessel ischemic change of the white matter. No large vessel territory infarction. No mass lesion, hemorrhage, hydrocephalus or extra-axial collection. Vascular: There is atherosclerotic calcification of the major vessels at the base of the brain. Skull: Negative Sinuses: Clear/normal Orbits: None CTA NECK FINDINGS Aortic arch: Aortic atherosclerosis. No aneurysm or dissection. Branching pattern is normal, with the left vertebral artery arising directly from the arch. Right carotid system: Common carotid artery widely patent to the bifurcation. Calcified plaque at the carotid bifurcation and ICA bulb but no stenosis. Cervical ICA widely patent to the skull base. Left carotid system: Common carotid artery widely patent to the bifurcation. Carotid bifurcation shows calcified plaque. No stenosis. Cervical ICA widely patent. Vertebral arteries: No vertebral artery origin stenosis. Both vertebral arteries are patent through the cervical region to the foramen magnum. Skeleton: Ordinary cervical spondylosis. Other neck: No mass or lymphadenopathy. Upper  chest: Normal Review of the MIP images confirms the above findings CTA HEAD FINDINGS Anterior circulation: Both internal carotid arteries are patent through the skull base and siphon regions. Siphon region show atherosclerotic calcification but no stenosis greater than 30%. The anterior and middle cerebral vessels are patent without proximal stenosis, aneurysm or vascular malformation. No large or medium vessel occlusion. Posterior circulation: Both vertebral arteries are widely patent to the basilar. There is atherosclerotic calcification in the vertebral artery V4 segments but no stenosis greater than 30%. Atherosclerotic plaque at the proximal basilar artery but without flow limiting stenosis. Posterior circulation branch vessels are normal. Venous sinuses: Patent Anatomic variants: None Review of the MIP images confirms the above findings IMPRESSION: No acute head CT finding. Age related atrophy and mild chronic small-vessel ischemic change of the white matter. Atherosclerotic change of the brachiocephalic vessels. No carotid bifurcation region stenosis. Atherosclerotic calcification in the carotid siphon regions, proximal basilar artery and vertebral artery V4 segments but without flow limiting stenosis. No large  or medium vessel intracranial occlusion Electronically Signed   By: Nelson Chimes M.D.   On: 04/24/2019 14:51   Ct Angio Neck W Or Wo Contrast  Result Date: 04/24/2019 CLINICAL DATA:  Episode of hypotension and loss of consciousness. Dizziness. EXAM: CT ANGIOGRAPHY HEAD AND NECK TECHNIQUE: Multidetector CT imaging of the head and neck was performed using the standard protocol during bolus administration of intravenous contrast. Multiplanar CT image reconstructions and MIPs were obtained to evaluate the vascular anatomy. Carotid stenosis measurements (when applicable) are obtained utilizing NASCET criteria, using the distal internal carotid diameter as the denominator. CONTRAST:  61mL OMNIPAQUE  IOHEXOL 350 MG/ML SOLN COMPARISON:  None. FINDINGS: CT HEAD FINDINGS Brain: Age related atrophy. Mild chronic small-vessel ischemic change of the white matter. No large vessel territory infarction. No mass lesion, hemorrhage, hydrocephalus or extra-axial collection. Vascular: There is atherosclerotic calcification of the major vessels at the base of the brain. Skull: Negative Sinuses: Clear/normal Orbits: None CTA NECK FINDINGS Aortic arch: Aortic atherosclerosis. No aneurysm or dissection. Branching pattern is normal, with the left vertebral artery arising directly from the arch. Right carotid system: Common carotid artery widely patent to the bifurcation. Calcified plaque at the carotid bifurcation and ICA bulb but no stenosis. Cervical ICA widely patent to the skull base. Left carotid system: Common carotid artery widely patent to the bifurcation. Carotid bifurcation shows calcified plaque. No stenosis. Cervical ICA widely patent. Vertebral arteries: No vertebral artery origin stenosis. Both vertebral arteries are patent through the cervical region to the foramen magnum. Skeleton: Ordinary cervical spondylosis. Other neck: No mass or lymphadenopathy. Upper chest: Normal Review of the MIP images confirms the above findings CTA HEAD FINDINGS Anterior circulation: Both internal carotid arteries are patent through the skull base and siphon regions. Siphon region show atherosclerotic calcification but no stenosis greater than 30%. The anterior and middle cerebral vessels are patent without proximal stenosis, aneurysm or vascular malformation. No large or medium vessel occlusion. Posterior circulation: Both vertebral arteries are widely patent to the basilar. There is atherosclerotic calcification in the vertebral artery V4 segments but no stenosis greater than 30%. Atherosclerotic plaque at the proximal basilar artery but without flow limiting stenosis. Posterior circulation branch vessels are normal. Venous sinuses:  Patent Anatomic variants: None Review of the MIP images confirms the above findings IMPRESSION: No acute head CT finding. Age related atrophy and mild chronic small-vessel ischemic change of the white matter. Atherosclerotic change of the brachiocephalic vessels. No carotid bifurcation region stenosis. Atherosclerotic calcification in the carotid siphon regions, proximal basilar artery and vertebral artery V4 segments but without flow limiting stenosis. No large or medium vessel intracranial occlusion Electronically Signed   By: Nelson Chimes M.D.   On: 04/24/2019 14:51   Dg Chest Portable 1 View  Result Date: 04/24/2019 CLINICAL DATA:  Syncopal episode. EXAM: PORTABLE CHEST 1 VIEW COMPARISON:  No recent prior. FINDINGS: Mediastinum hilar structures normal. Cardiomegaly with normal pulmonary vascularity. Low lung volumes. No focal alveolar infiltrate. No pleural effusion pneumothorax. IMPRESSION: 1.  Cardiomegaly.  No pulmonary venous congestion. 2.  Low lung volumes. Electronically Signed   By: Marcello Moores  Register   On: 04/24/2019 13:31    EKG:  EKG: normal EKG, normal sinus rhythm, unchanged from previous tracings, RBBB. Vent. rate 68 BPM PR interval * ms QRS duration 121 ms QT/QTc 413/440 ms P-R-T axes -12 35 152 IMPRESSION AND PLAN:   78 y.o. male past medical history of basal cell carcinoma, hyperlipidemia, hypertension, hypertensive retinopathy of both eyes, and  diabetes mellitus type 2 presenting to the ED from rehabilitation at [facility] with syncopal episode.  1. Syncope and collapse-unclear etiology but will need to rule out ischemic event, posterior circulation insufficiency, seizure and possible cardiac etiology - Admit to telemetry unit - Check CTA head and neck - CheckOrthostatic Vital Signs - ObtainEEG due to atypical presentation concerning for possible seizure  - CheckCardiac Echo  - TSH, UA  - PT consult - Cardiology consult  2. Leukocytosis : WBC 15.1 - No signs of infectious  process, if spike a fever may need cultures and possible empiric tx - UA pending - CXR negative for acute cardiopulmonary process - Trend WBC's   3. HLD  + Goal LDL<100 - Atorvastatin 80mg  PO qhs  4. HTN -now hypotensive with bradycardia + Goal BP <130/80 - Hold lisinopril, chlorthalidone, and enalapril  - Hold Atenolol in the setting of bradycardia  4. DM type II: His sugars have been well-controlled on current regimen  - Hold glimepiride in house - Low intensity sliding scale insulin coverage - FS BS qac and qhs   5. DVT prophylaxis - Enoxaparin  SubQ     All the records are reviewed and case discussed with ED provider. Management plans discussed with the patient, family and they are in agreement.  CODE STATUS: FULL  TOTAL TIME TAKING CARE OF THIS PATIENT: 50 minutes.    on 04/24/2019 at 8:40 PM  Rufina Falco, DNP, FNP-BC [facility] Nurse Practitioner Between 7am to 6pm - Pager [phone]  After 6pm go to [email or web address] - password [email or web address] [place] [facility]  Office  [phone]  CC: Primary care physician; Ria Bush, MD

## 2019-04-24 NOTE — ED Triage Notes (Signed)
Pt arrives from rehabilitation at [facility]. Pt has syncopal episode and had a drop in HR and BP. In ER triage pt a& o x 4 with no complaints of pain. Pt remains hypotensive.

## 2019-04-24 NOTE — ED Provider Notes (Signed)
[facility] Emergency Department Provider Note  ____________________________________________   First MD Initiated Contact with Patient 04/24/19 1203     (approximate)  I have reviewed the triage vital signs and the nursing notes.   HISTORY  Chief Complaint Loss of Consciousness    HPI Blake Peterson is a 78 y.o. male with hypertension, hyperlipidemia who arrives from rehabilitation.  Pt had epsiode of hypotension at rehabilitation that caused him to have loss of consciousness.  Pt felt dizzy while walking and sat down and had brief LOC.  Loss of conscious this occurred 1 time, better on its own, unclear what brought it on.  Did not hit his head.  He did urinate on himself but did not have any shaking activity and no postictal.  He says his baseline BP is around 240-973 systolic.  He does take diuretic. No recent changes to BP medications.  He did recently start methotrexate in May for RA.  Pt says he may not be drinking as much water.  No prior heart surgeries.  No chest pain. Some mild SOB. No fevers.        Past Medical History:  Diagnosis Date  . Basal cell carcinoma 12/2016   R anterior tibia  . Cataract   . Diverticulosis of colon   . History of colonic polyps   . HLD (hyperlipidemia)   . HTN (hypertension)   . Hypertensive retinopathy of both eyes, grade 1    [name]  . 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 Prediabetes   . Wears hearing aid in left ear     Patient Active Problem List   Diagnosis Date Noted  . Weakness of both lower extremities 02/24/2019  . Low energy 02/24/2019  . 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 High risk medication use 01/04/2019  . Current chronic use of systemic steroids 12/21/2018  . Lower extremity edema 12/21/2018  . 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 BPH (benign prostatic hyperplasia) 12/13/2018  . 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 Pedal edema 05/10/2018  . PMR  (polymyalgia rheumatica) (HCC) 05/02/2018  . Left foot pain 11/15/2015  . 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 Hypertensive retinopathy of both eyes, grade 1   . Health maintenance examination 08/10/2014  . Advanced care planning/counseling discussion 08/10/2014  . Bilateral hearing loss due to cerumen impaction 08/10/2014  . Essential tremor 09/09/2012  . Medicare annual wellness visit, subsequent 08/07/2011  . History of colonic polyps   . Diverticulosis of colon   . HTN (hypertension)   . HLD (hyperlipidemia)   . Controlled type 2 diabetes mellitus without complication, without long-term current use of insulin ([place]) 08/11/2010  . Vitamin D deficiency 08/07/2010  . 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 Elevated PSA 02/16/2007    Past Surgical History:  Procedure Laterality Date  . CATARACT EXTRACTION, BILATERAL  06/2017  . COLONOSCOPY  11/2012   mild-mod diverticulosis, int hem, rec rpt 5 yrs [facility] Plan)  . COLONOSCOPY  01/2018   2 TA, mod diverticulosis, rpt 5 yrs Fuller Plan)  . POLYPECTOMY    . UMBILICAL HERNIA REPAIR  01/07/04    Prior to Admission medications   Medication Sig Start Date End Date Taking? Authorizing Provider  acetaminophen (TYLENOL) 500 MG tablet Take 1 tablet (500 mg total) by mouth every 6 (six) hours as needed for moderate pain. 03/10/19   Ria Bush, MD  aspirin 81 MG tablet Take 81 mg by mouth daily.  [provider]  atenolol (TENORMIN) 50 MG tablet TAKE 1 TABLET (50 MG TOTAL) BY MOUTH DAILY. 10/13/18   Ria Bush, MD  atorvastatin (LIPITOR) 80 MG tablet TAKE 1 TABLET (80 MG TOTAL) BY MOUTH AT BEDTIME. 10/17/18   Ria Bush, MD  chlorthalidone (HYGROTON) 25 MG tablet TAKE 1 TABLET (25 MG TOTAL) BY MOUTH DAILY. 11/21/18   Ria Bush, MD  cholecalciferol (VITAMIN D) 1000 UNITS tablet Take 1,000 Units by mouth daily.      [provider]  enalapril (VASOTEC) 10 MG tablet TAKE 1 TABLET BY  MOUTH TWICE A DAY 04/14/19   Ria Bush, MD  glimepiride (AMARYL) 1 MG tablet TAKE 1 TABLET (1 MG TOTAL) BY MOUTH DAILY WITH BREAKFAST. 03/01/19   Ria Bush, MD  KLOR-CON M10 10 MEQ tablet TAKE 1 TABLET BY MOUTH EVERY DAY 03/01/19   Ria Bush, MD  methotrexate (RHEUMATREX) 2.5 MG tablet 7 tabs (17.5 mg ) weekly x  12 weeks. #84 tabs 02/02/19   [provider]  predniSONE (DELTASONE) 5 MG tablet Take 5 mg by mouth as needed.    [provider]  sildenafil (VIAGRA) 100 MG tablet Take 100 mg by mouth daily as needed.      [provider]    Allergies Naprosyn [naproxen]  Family History  Problem Relation Age of Onset  . Hypertension Sister   . Colon cancer Sister 74  . Colon polyps Sister   . Heart attack Father 48  . Esophageal cancer Neg Hx   . Rectal cancer Neg Hx   . Stomach cancer Neg Hx     Social History Social History   Tobacco Use  . Smoking status: Former Smoker    Quit date: 11/10/1984    Years since quitting: 34.4  . Smokeless tobacco: Never Used  Substance Use Topics  . Alcohol use: Yes    Alcohol/week: 2.0 - 3.0 standard drinks    Types: 2 - 3 Glasses of wine per week  . Drug use: No      Review of Systems Constitutional: No fever/chills, positive LOC Eyes: No visual changes. ENT: No sore throat. Cardiovascular: Denies chest pain. Respiratory: Positive for mild shortness of breath Gastrointestinal: No abdominal pain.  No nausea, no vomiting.  No diarrhea.  No constipation. Genitourinary: Negative for dysuria. Musculoskeletal: Negative for back pain. Skin: Negative for rash. Neurological: Negative for headaches, focal weakness or numbness. All other ROS negative ____________________________________________   PHYSICAL EXAM:  VITAL SIGNS: ED Triage Vitals  Enc Vitals Group     BP 04/24/19 1134 (!) 88/61     Pulse Rate 04/24/19 1134 67     Resp 04/24/19 1134 18     Temp 04/24/19 1134 98.1 F (36.7 C)      Temp Source 04/24/19 1134 Oral     SpO2 04/24/19 1134 95 %     Weight 04/24/19 1135 210 lb (95.3 kg)     Height 04/24/19 1135 5\' 8"  (1.727 m)     Head Circumference --      Peak Flow --      Pain Score 04/24/19 1135 0     Pain Loc --      Pain Edu? --      Excl. in [place]? --     Constitutional: Alert and oriented. Well appearing and in no acute distress. Eyes: Conjunctivae are normal. EOMI. Head: Atraumatic. Nose: No congestion/rhinnorhea. Mouth/Throat: Mucous membranes are moist.   Neck: No stridor. Trachea Midline. FROM Cardiovascular: Normal  rate, regular rhythm. Grossly normal heart sounds.  Good peripheral circulation. Respiratory: Normal respiratory effort.  No retractions. Lungs CTAB. Gastrointestinal: Soft and nontender. No distention. No abdominal bruits.  Musculoskeletal: No lower extremity tenderness nor edema.  No joint effusions. Neurologic:  Normal speech and language. No gross focal neurologic deficits are appreciated.  Skin:  Skin is warm, dry and intact. No rash noted. Psychiatric: Mood and affect are normal. Speech and behavior are normal. GU: Deferred   ____________________________________________   LABS (all labs ordered are listed, but only abnormal results are displayed)  Labs Reviewed  CBC WITH DIFFERENTIAL/PLATELET - Abnormal; Notable for the following components:      Result Value   WBC 15.1 (*)    Neutro Abs 11.1 (*)    Monocytes Absolute 1.3 (*)    Abs Immature Granulocytes 0.26 (*)    All other components within normal limits  COMPREHENSIVE METABOLIC PANEL - Abnormal; Notable for the following components:   Sodium 132 (*)    Chloride 96 (*)    Glucose, Bld 151 (*)    All other components within normal limits  SARS CORONAVIRUS 2 (HOSPITAL ORDER, [facility])  TROPONIN I (HIGH SENSITIVITY)   ____________________________________________   ED ECG REPORT I, Vanessa Shickshinny, the attending physician, personally viewed and  interpreted this ECG.  EKG normal sinus rate of 68, no st elevation, twave inversion v2-v5, normal intervals.  ____________________________________________  [place], personally viewed and evaluated these images (plain radiographs) as part of my medical decision making, as well as reviewing the written report by the radiologist.  ED MD interpretation:  Chest xray no PNA possible cardiomegaly.   Official radiology report(s): Dg Chest Portable 1 View  Result Date: 04/24/2019 CLINICAL DATA:  Syncopal episode. EXAM: PORTABLE CHEST 1 VIEW COMPARISON:  No recent prior. FINDINGS: Mediastinum hilar structures normal. Cardiomegaly with normal pulmonary vascularity. Low lung volumes. No focal alveolar infiltrate. No pleural effusion pneumothorax. IMPRESSION: 1.  Cardiomegaly.  No pulmonary venous congestion. 2.  Low lung volumes. Electronically Signed   By: Marcello Moores  Register   On: 04/24/2019 13:31    ____________________________________________   PROCEDURES  Procedure(s) performed (including Critical Care):  Procedures   ____________________________________________   INITIAL IMPRESSION / ASSESSMENT AND PLAN / ED COURSE  Blake Peterson was evaluated in Emergency Department on 04/24/2019 for the symptoms described in the history of present illness. He was evaluated in the context of the global COVID-19 pandemic, which necessitated consideration that the patient might be at risk for infection with the SARS-CoV-2 virus that causes COVID-19. Institutional protocols and algorithms that pertain to the evaluation of patients at risk for COVID-19 are in a state of rapid change based on information released by regulatory bodies including the CDC and federal and state organizations. These policies and algorithms were followed during the patient's care in the ED.    Most likely secondary to low blood pressure.  Will give some fluid given orthostatic to evaluate for dehydration. Will get  EKG to evaluate for arrhythmia. Will get labs to evaluate for electrolyte abnormalities, AKI.  Will get labs to evaluate for ACS. No headache to suggest SAH.  No abd pain to suggest AAA. No current SOB to suggest PE, no leg swelling, no recent travel no recent surgery and vitals are normal. Patient had some urinary incontinence however no other symptoms to suggest seizure.   Troponin elevated at 15.  White count was 15.9.  Given patient's T  wave inversion which we are unclear if it is new plus the elevated troponin XV we will discussed with the medicine team for admission for syncope protocol.  Per [facility] syncope rules patient's blood pressure was less than 90 and would recommend admission.  Discussed with the hospital team and they will admit patient.     ____________________________________________   FINAL CLINICAL IMPRESSION(S) / ED DIAGNOSES   Final diagnoses:  Syncope and collapse      MEDICATIONS GIVEN DURING THIS VISIT:  Medications  0.9 %  sodium chloride infusion (1,000 mLs Intravenous New Bag/Given 04/24/19 1149)     ED Discharge Orders    None       Note:  This document was prepared using Dragon voice recognition software and may include unintentional dictation errors.   Vanessa Bardolph, MD 04/24/19 [id number]

## 2019-04-24 NOTE — Progress Notes (Signed)
   04/24/19 1100  Clinical Encounter Type  Visited With Patient and family together;Health care provider  Visit Type Initial;Other (Comment)  Referral From Nurse  Consult/Referral To Chaplain  Spiritual Encounters  Spiritual Needs Prayer;Emotional  Stress Factors  Patient Stress Factors Lack of knowledge  Family Stress Factors Lack of knowledge  Chaplain received page for RR. Patient heart rate and blood pressure drop while doing rehab. Patient was transferred to ED. Chaplain walked with wife Patrici Ranks) to registration in ED to get screened in and afterwards walked wife to patient's room. Chaplain introduced herself to patient and he was appreciative for Chaplain being there. Chaplain listened to patient tell what happened to him before he made it to the ER. Chaplain gave encouraging words that the care team will run test to see what is wrong and patient felt better because that was his concerns, not knowing what was wrong with him. Chaplain offered prayer and asked if they needed her please have Chaplain page.

## 2019-04-24 NOTE — Therapy (Signed)
[place] MAIN [facility] SERVICES [address] [place], [place], [address] Phone: [phone]   Fax:  [phone]  Physical Therapy Treatment  Patient Details  Name: Blake Peterson MRN: [id number] Date of Birth: 22-Jan-1941 Referring Provider (PT): Dr. Annalee Genta   Encounter Date: 04/24/2019  PT End of Session - 04/24/19 1154    Visit Number  8    Number of Visits  25    Date for PT Re-Evaluation  06/19/19    Authorization Type  eval 03/27/19    PT Start Time  1015    PT Stop Time  1100    PT Time Calculation (min)  45 min    Equipment Utilized During Treatment  Gait belt    Activity Tolerance  Patient tolerated treatment well    Behavior During Therapy  [name] for tasks assessed/performed       Past Medical History:  Diagnosis Date  . Basal cell carcinoma 12/2016   R anterior tibia  . Cataract   . Diverticulosis of colon   . History of colonic polyps   . HLD (hyperlipidemia)   . HTN (hypertension)   . Hypertensive retinopathy of both eyes, grade 1    [name]  . 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 Prediabetes   . 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 CATARACT EXTRACTION, BILATERAL  06/2017  . COLONOSCOPY  11/2012   mild-mod diverticulosis, int hem, rec rpt 5 yrs Fuller Plan)  . COLONOSCOPY  01/2018   2 TA, mod diverticulosis, rpt 5 yrs Fuller Plan)  . POLYPECTOMY    . UMBILICAL HERNIA REPAIR  01/07/04    Vitals:   04/24/19 1108 04/24/19 1113 04/24/19 1116 04/24/19 1122  BP: (!) 97/56 (!) 90/59 (!) 78/47 92/75  Pulse: 75 73 95 63  SpO2:        Subjective Assessment - 04/24/19 1030    Subjective  Pt reports that he is doing well today.  He reports that he feels his hamstring pain is getting better, and that his balance is improving slightly. No new questions or comments at this time.    Pertinent History  Pt reports issues with generalized pain in multiple joints of the body including  ankles, knees, hips, and hands. Pt reports that he had plain film radiographs of his shoulders and feet. 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 Pt states that he has started having difficulty getting out of bed due to weakness. He has been struggling with is balance as well. He started using a quad cane for ambulation to assist with his balance Pt reports that he has a fear of falling but denies any falls in the last 12 years. Numbness on the back of LLE for multiple months. Pt reports numbness in the bottom of both feet.    Patient Stated Goals  Improve balance    Currently in Pain?  No/denies          Treatment:      There-Ex       PNF stretching on L hamstring  x6 with a 20 sec hold at end range   Piriformis stretching  x5   Supine bridges 2x10 with 3 sec hold   Seated Sciatic nerve glides 2x10 on Left    STSs 2x15 without UE support      Penguins red band 2x10  Neuromuscular Re-education       Semi-Tandem stance on foam 2x30 sec bilaterally   Slow golf swings with a focus on trunk rotation x15   Gait with a focus on increased arm swing and step length 2x75'          While pt was performing his third rep of gait in hallway he stated that he was feeling very lightheaded and needed to sit down.  After a few seconds of sitting in the chair, he lost consciousness. Pt began to snore and shake slightly. Accessory staff was advised to call a rapid response while PT and student PT lowered him to the ground. Of note while lowering patient to the ground his legs got tangled under his body and his knees were maximally flexed. Once on the ground patient's feet were elevated and he regained consciousness within 2-3 seconds. Vitals obtained with dynamap and were WNL. Upon waking pt was confused and his speech was slightly slurred. Pt appears to have lost his bladder and it smelled like he may have lost his bowels but never checked. RN  from cardiac rehab took his blood sugar, stating that it was WNL. Rapid response arrived and took over care connecting pt to telemetry monitoring. Blood pressure obtained intermittently and continued to drop eventually reaching 78'E systolic. Cognition also slowly improved. His HR started to rapidly drop from 60's down to the 30's bpm. Rapid response RN requested code blue called which was performed by staff. By the time code team arrived HR had recovered however BP remained low.  Pt was transported via a gurney to the ED by code/rapid reponse team. Patient's wife was present, and accompanied him to the ED.       Pt demonstrates excellent motivation throughout today's session.  He demonstrates improvement in his STSs, demonstrating a decreased reliance on UE usage with an increase in reps, only using UE on the final 2-3 reps with fatigue.   He demonstrates the ability to achieve trunk rotation with a golf swing, but has difficulty dissociating his trunk and hips.  He demonstrates good carryover of step length, arm swing, and ga it speed from last session.  Pt educated throughout session about proper posture and technique with exercises. Improved exercise technique, movement at target joints, use of target muscles after min to mod verbal, visual, tactile cues.  Pt will benefit from continued skilled PT services to improve his static and dynamic balance, as well as improving his strength and technique.                       PT Short Term Goals - 03/29/19 1235      PT SHORT TERM GOAL #1   Title  Pt will be independent with HEP in order to improve strength and balance in order to decrease fall risk and improve function at home.    Time  6    Period  Weeks    Status  New    Target Date  05/08/19        PT Long Term Goals - 03/29/19 1235      PT LONG TERM GOAL #1   Title  Pt will improve BERG by at least 3 points in order to demonstrate clinically significant improvement in  balance.    Baseline  03/27/19: 45/56    Time  12    Period  Weeks    Status  New    Target Date  06/21/19  PT LONG TERM GOAL #2   Title  Pt will decrease TUG to below 14 seconds/decrease in order to demonstrate decreased fall risk.    Baseline  03/27/19: 15.6s    Time  12    Period  Weeks    Status  New    Target Date  06/21/19      PT LONG TERM GOAL #3   Title  Pt will improve ABC by at least 13% in order to demonstrate clinically significant improvement in balance confidence.    Baseline  03/27/19: 41.25%    Time  12    Period  Weeks    Status  New    Target Date  06/19/19      PT LONG TERM GOAL #4   Title  Pt will decrease 5TSTS by at least 3 seconds in order to demonstrate clinically significant improvement in LE strength.    Baseline  03/27/19: 18.1s    Time  12    Period  Weeks    Status  New    Target Date  06/19/19      PT LONG TERM GOAL #5   Title  Pt will increase self-selected 10MWT by at least 0.13 m/s in order to demonstrate clinically significant improvement in community ambulation.    Baseline  03/27/19: Self-selected: 16.5s = 0.61 m/s; Fastest: 10.0s = 1.0 m/s    Time  12    Period  Weeks    Status  New    Target Date  06/19/19            Plan - 04/24/19 1153    Clinical Impression Statement  Pt demonstrates excellent motivation throughout today's session.  He demonstrates improvement in his STSs, demonstrating a decreased reliance on UE usage with an increase in reps, only using UE on the final 2-3 reps with fatigue.   He demonstrates the ability to achieve trunk rotation with a golf swing, but has difficulty dissociating his trunk and hips.  He demonstrates good carryover of step length, arm swing, and ga it speed from last session.  Pt educated throughout session about proper posture and technique with exercises. Improved exercise technique, movement at target joints, use of target muscles after min to mod verbal, visual, tactile cues.  Pt will benefit  from continued skilled PT services to improve his static and dynamic balance, as well as improving his strength and technique.  While pt was performing his third rep of gait in hallway he stated that he was feeling very lightheaded and needed to sit down.  After a few seconds of sitting in the chair, he lost consciousness. Pt began to snore and shake slightly. Accessory staff was advised to call a rapid response while PT and student PT lowered him to the ground. Of note while lowering patient to the ground his legs got tangled under his body and his knees were maximally flexed. Once on the ground patient's feet were elevated and he regained consciousness within 2-3 seconds. Vitals obtained with dynamap and were WNL. Upon waking pt was confused and his speech was slightly slurred. Pt appears to have lost his bladder and it smelled like he may have lost his bowels but never checked. RN from cardiac rehab took his blood sugar, stating that it was WNL. Rapid response arrived and took over care connecting pt to telemetry monitoring. Blood pressure obtained intermittently and continued to drop eventually reaching 88'F systolic. Cognition also slowly improved. His HR started to rapidly drop from 60's  down to the 30's bpm. Rapid response RN requested code blue called which was performed by staff. By the time code team arrived HR had recovered however BP remained low.  Pt was transported via a gurney to the ED by code/rapid reponse team. Patient's wife was present, and accompanied him to the ED.    Personal Factors and Comorbidities  Comorbidity 1;Age;Comorbidity 2    Comorbidities  HTN, polyarthralgia    Examination-Activity Limitations  Locomotion Level;Squat;Stairs;Transfers    Examination-Participation Restrictions  Cleaning;Community Activity;Laundry;Shop;Yard Work    [employer]  Evolving/Moderate complexity    Rehab Potential  Good    PT Frequency  2x / week    PT Duration  12 weeks     PT Treatment/Interventions  ADLs/Self Care Home Management;Aquatic Therapy;Biofeedback;Canalith Repostioning;Cryotherapy;Electrical Stimulation;Moist Heat;Traction;Ultrasound;DME Instruction;Gait training    PT Next Visit Plan  trunk rotation and arm swing with gait; progress balance and strengthening; continue to address hamstring    PT Home Exercise Plan  Medbridge Access Code: [facility]; [email or web address]; [email or web address]    Consulted and Agree with Plan of Care  Patient       Patient will benefit from skilled therapeutic intervention in order to improve the following deficits and impairments:  Abnormal gait, Decreased balance, Decreased endurance, Decreased mobility, Decreased strength, Difficulty walking  Visit Diagnosis: 1. Muscle weakness (generalized)   2. Other lack of coordination   3. Unsteadiness on feet        Problem List Patient Active Problem List   Diagnosis Date Noted  . Syncope and collapse 04/24/2019  . Weakness of both lower extremities 02/24/2019  . Low energy 02/24/2019  . 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 High risk medication use 01/04/2019  . Current chronic use of systemic steroids 12/21/2018  . Lower extremity edema 12/21/2018  . 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 BPH (benign prostatic hyperplasia) 12/13/2018  . Thoracic aorta atherosclerosis ([place]) 06/15/2018  . Pedal edema 05/10/2018  . 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 Left foot pain 11/15/2015  . 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 Hypertensive retinopathy of both eyes, grade 1   . Health maintenance examination 08/10/2014  . Advanced care planning/counseling discussion 08/10/2014  . Bilateral hearing loss due to cerumen impaction 08/10/2014  . Essential tremor 09/09/2012  . Medicare annual wellness visit, subsequent 08/07/2011  . History of colonic polyps   . Diverticulosis of colon   . HTN (hypertension)   . HLD (hyperlipidemia)   . Controlled type 2 diabetes mellitus  without complication, without long-term current use of insulin ([place]) 08/11/2010  . Vitamin D deficiency 08/07/2010  . 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 Elevated PSA 02/16/2007   This entire session was performed under direct supervision and direction of a licensed therapist/therapist assistant . I have personally read, edited and approve of the note as written.   Olivia Mackie Llewelyn SPT Phillips Grout PT, DPT, GCS  Mardee Clune 04/24/2019, 2:35 PM  [place] MAIN [facility] SERVICES [address] [place], [place], [address] Phone: [phone]   Fax:  [phone]  Name: Blake Peterson MRN: [id number] Date of Birth: Oct 23, 1940

## 2019-04-25 ENCOUNTER — Inpatient Hospital Stay
Admit: 2019-04-25 | Discharge: 2019-04-25 | Disposition: A | Discharge status: Home / Self Care | Payer: PPO | Attending: Nurse Practitioner | Admitting: Nurse Practitioner

## 2019-04-25 ENCOUNTER — Other Ambulatory Visit: Payer: PPO

## 2019-04-25 DIAGNOSIS — R55 Syncope and collapse: Secondary | ICD-10-CM

## 2019-04-25 LAB — GLUCOSE, CAPILLARY
Glucose-Capillary: 107 mg/dL — ABNORMAL HIGH (ref 70–99)
Glucose-Capillary: 111 mg/dL — ABNORMAL HIGH (ref 70–99)
Glucose-Capillary: 139 mg/dL — ABNORMAL HIGH (ref 70–99)
Glucose-Capillary: 127 mg/dL — ABNORMAL HIGH (ref 70–99)

## 2019-04-25 LAB — CBC WITH DIFFERENTIAL/PLATELET
Abs Immature Granulocytes: 0.32 10*3/uL — ABNORMAL HIGH (ref 0.00–0.07)
Basophils Absolute: 0.1 10*3/uL (ref 0.0–0.1)
Basophils Relative: 1 %
Eosinophils Absolute: 0.1 10*3/uL (ref 0.0–0.5)
Eosinophils Relative: 1 %
HCT: 44.4 % (ref 39.0–52.0)
Hemoglobin: 14.4 g/dL (ref 13.0–17.0)
Immature Granulocytes: 2 %
Lymphocytes Relative: 22 %
Lymphs Abs: 3 10*3/uL (ref 0.7–4.0)
MCH: 30.5 pg (ref 26.0–34.0)
MCHC: 32.4 g/dL (ref 30.0–36.0)
MCV: 94.1 fL (ref 80.0–100.0)
Monocytes Absolute: 1.1 10*3/uL — ABNORMAL HIGH (ref 0.1–1.0)
Monocytes Relative: 8 %
Neutro Abs: 9.4 10*3/uL — ABNORMAL HIGH (ref 1.7–7.7)
Neutrophils Relative %: 66 %
Platelets: 318 10*3/uL (ref 150–400)
RBC: 4.72 MIL/uL (ref 4.22–5.81)
RDW: 14.8 % (ref 11.5–15.5)
WBC: 14 10*3/uL — ABNORMAL HIGH (ref 4.0–10.5)
nRBC: 0 % (ref 0.0–0.2)

## 2019-04-25 LAB — URINALYSIS, COMPLETE (UACMP) WITH MICROSCOPIC
Bacteria, UA: NONE SEEN
Bilirubin Urine: NEGATIVE
Glucose, UA: NEGATIVE mg/dL
Ketones, ur: NEGATIVE mg/dL
Leukocytes,Ua: NEGATIVE
Nitrite: NEGATIVE
Protein, ur: NEGATIVE mg/dL
Specific Gravity, Urine: 1.026 (ref 1.005–1.030)
pH: 5 (ref 5.0–8.0)

## 2019-04-25 LAB — BASIC METABOLIC PANEL
Anion gap: 10 (ref 5–15)
BUN: 17 mg/dL (ref 8–23)
CO2: 28 mmol/L (ref 22–32)
Calcium: 9.8 mg/dL (ref 8.9–10.3)
Chloride: 97 mmol/L — ABNORMAL LOW (ref 98–111)
Creatinine, Ser: 0.73 mg/dL (ref 0.61–1.24)
GFR calc Af Amer: >60 mL/min (ref >60)
GFR calc non Af Amer: >60 mL/min (ref >60)
Glucose, Bld: 107 mg/dL — ABNORMAL HIGH (ref 70–99)
Potassium: 3.4 mmol/L — ABNORMAL LOW (ref 3.5–5.1)
Sodium: 135 mmol/L (ref 135–145)

## 2019-04-25 LAB — ECHOCARDIOGRAM COMPLETE
Height: 68 in
Weight: 3350.4 oz

## 2019-04-25 LAB — TROPONIN I (HIGH SENSITIVITY): Troponin I (High Sensitivity): 17 ng/L (ref <18)

## 2019-04-25 MED ORDER — PERFLUTREN LIPID MICROSPHERE
1.0000 mL | INTRAVENOUS | Status: AC | PRN
  Administered 2019-04-25: 11:00:00 3 mL via INTRAVENOUS
  Filled 2019-04-25: qty 10

## 2019-04-25 NOTE — Progress Notes (Signed)
eeg completed ° °

## 2019-04-25 NOTE — Evaluation (Signed)
Physical Therapy Evaluation Patient Details Name: Blake Peterson MRN: [id number] DOB: Apr 09, 1941 Today's Date: 04/25/2019   History of Present Illness  Pt is a 78 year old male with past medical history of basal cell carcinoma, hyperlipidemia, hypertension, hypertensive retinopathy of both eyes, and diabetes mellitus type 2 presenting to the ED from rehabilitation at [facility] with syncopal episode.  Per PT report, patient was in PT session performing third round of gait training in the hallway when he started feeling very lightheaded and needed to sit.  After a few seconds of sitting in the chair he lost consciousness and began snoring and shaking slightly.  Pt presented with bouts of decreasing BP and HR and was taken to the ED.  Assessment includes: Syncope and collapse, Leukocytosis, HLD, HTN, and DM II.    Clinical Impression  Pt with pending cardio consult.  Dr. Brigitte Pulse contacted and stated patient ok to participate with PT services without restriction prior to consult. Pt presents with min deficits in strength, transfers, mobility, gait, balance, and activity tolerance but overall performed well during the session.  Orthostatic vital signs taken as follows: supine 107/61 with HR 87, sitting: 119/77 with HR 97, and standing: 128/89 with HR 88 all without adverse symptoms.  Pt activity progressed slowly during the session with therex followed by transfer training followed by amb x 50', x 100', and x 150'.  Pt's HR and SpO2 measured frequently throughout the session and remained WNL with no adverse symptoms.  Pt will benefit from continued OPPT services upon discharge to safely address above deficits for decreased caregiver assistance and eventual return to PLOF.        Follow Up Recommendations Outpatient PT;Other (comment)(Pt currently receiving OPPT at [facility])    Equipment Recommendations  None recommended by PT    Recommendations for Other Services       Precautions / Restrictions  Precautions Precautions: Other (comment) Precaution Comments: Recent syncope with collapse while ambulating at [facility] with OPPT Restrictions Weight Bearing Restrictions: No      Mobility  Bed Mobility Overal bed mobility: Modified Independent             General bed mobility comments: Extra time and effort with bed mobility tasks with use of the bed rail but no physical assistance required  Transfers Overall transfer level: Needs assistance Equipment used: None Transfers: Sit to/from Stand Sit to Stand: Supervision         General transfer comment: Good eccentric and concentric control with min verbal cues for proper hand placement  Ambulation/Gait Ambulation/Gait assistance: Min guard Gait Distance (Feet): 150 Feet, 100 feet, 50 feet Assistive device: None Gait Pattern/deviations: Step-through pattern;Decreased step length - right;Decreased step length - left Gait velocity: decreased   General Gait Details: Slow cadence with amb with short B step length and min B arm swing but overall steady without LOB  Stairs            Wheelchair Mobility    Modified Rankin (Stroke Patients Only)       Balance Overall balance assessment: Mild deficits observed, not formally tested                                           Pertinent Vitals/Pain Pain Assessment: No/denies pain    Home Living Family/patient expects to be discharged to:: Private residence Living Arrangements: Spouse/significant other Available Help at Discharge: Family;Available 24  hours/day Type of Home: House Home Access: Stairs to enter Entrance Stairs-Rails: None Entrance Stairs-Number of Steps: 3 Home Layout: One level Home Equipment: Cane - quad;Walker - 4 wheels;Shower seat      Prior Function Level of Independence: Independent         Comments: Ind amb limited community distances without an AD, no fall history, Ind with ADLs     Hand Dominance         Extremity/Trunk Assessment   Upper Extremity Assessment Upper Extremity Assessment: Generalized weakness    Lower Extremity Assessment Lower Extremity Assessment: Generalized weakness       Communication   Communication: No difficulties  Cognition Arousal/Alertness: Awake/alert Behavior During Therapy: WFL for tasks assessed/performed Overall Cognitive Status: Within Functional Limits for tasks assessed                                        General Comments      Exercises Total Joint Exercises Ankle Circles/Pumps: Strengthening;Both;10 reps Quad Sets: Strengthening;Both;10 reps Gluteal Sets: Strengthening;Both;10 reps Hip ABduction/ADduction: AROM;Both;5 reps Long Arc Quad: Both;10 reps;AROM Knee Flexion: 10 reps;Both;AROM Marching in Standing: AROM;10 reps;Both;Seated   Assessment/Plan    PT Assessment Patient needs continued PT services  PT Problem List Decreased strength;Decreased activity tolerance       PT Treatment Interventions Gait training;Stair training;Functional mobility training;Therapeutic activities;Therapeutic exercise;Balance training;Patient/family education    PT Goals (Current goals can be found in the Care Plan section)  Acute Rehab PT Goals Patient Stated Goal: To get stronger and return home PT Goal Formulation: With patient Time For Goal Achievement: 05/08/19 Potential to Achieve Goals: Good    Frequency Min 2X/week   Barriers to discharge        Co-evaluation               AM-PAC PT "6 Clicks" Mobility  Outcome Measure Help needed turning from your back to your side while in a flat bed without using bedrails?: A Little Help needed moving from lying on your back to sitting on the side of a flat bed without using bedrails?: A Little Help needed moving to and from a bed to a chair (including a wheelchair)?: None Help needed standing up from a chair using your arms (e.g., wheelchair or bedside chair)?:  None Help needed to walk in hospital room?: A Little Help needed climbing 3-5 steps with a railing? : A Little 6 Click Score: 20    End of Session Equipment Utilized During Treatment: Gait belt Activity Tolerance: Patient tolerated treatment well Patient left: in chair;with family/visitor present;with call bell/phone within reach Nurse Communication: Mobility status PT Visit Diagnosis: Muscle weakness (generalized) (M62.81)    Time: 4235-3614 PT Time Calculation (min) (ACUTE ONLY): 40 min   Charges:   PT Evaluation $PT Eval Low Complexity: 1 Low PT Treatments $Therapeutic Exercise: 8-22 mins $Therapeutic Activity: 8-22 mins        D. Scott Gessica Jawad PT, DPT 04/25/19, 11:27 AM

## 2019-04-25 NOTE — Procedures (Signed)
Patient Name: Blake Peterson  MRN: [id number]  Epilepsy Attending: Lora Havens  Referring Physician/Provider: Rufina Falco, NP  Date: 04/25/2019 Duration: 24.08-minute  Patient history: 78 year old male with syncope.  EEG ordered to evaluate for seizures.  Level of alertness: Awake  Technical aspects: This EEG study was done with scalp electrodes positioned according to the 10-20 International system of electrode placement. Electrical activity was acquired at a sampling rate of 500Hz  and reviewed with a high frequency filter of 70Hz  and a low frequency filter of 1Hz . EEG data were recorded continuously and digitally stored.   DESCRIPTION:  The posterior dominant rhythm consists of 8 Hz activity of moderate voltage (25-35 uV) seen predominantly in posterior head regions, symmetric and reactive to eye opening and eye closing.  Physiologic photic driving was not seen during photic stimulation.  Hyperventilation was not performed.   IMPRESSION: This study is within normal limits. However, only wakefulness and drowsiness were recorded. If suspicion for interictal activity remains a concern, a prolonged study including sleep should be considered. No seizures or epileptiform discharges were seen throughout the recording.  Rashunda Passon Barbra Sarks   \

## 2019-04-25 NOTE — Therapy (Signed)
[place] MAIN [facility] SERVICES [address] [place], [place], [address] Phone: [phone]   Fax:  [phone]  Occupational Therapy Treatment  Patient Details  Name: Blake Peterson MRN: [id number] Date of Birth: October 15, 1940 No data recorded  Encounter Date: 04/24/2019    Past Medical History:  Diagnosis Date  . Basal cell carcinoma 12/2016   R anterior tibia  . Cataract   . Diverticulosis of colon   . History of colonic polyps   . HLD (hyperlipidemia)   . HTN (hypertension)   . Hypertensive retinopathy of both eyes, grade 1    [name]  . 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 Prediabetes   . 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 CATARACT EXTRACTION, BILATERAL  06/2017  . COLONOSCOPY  11/2012   mild-mod diverticulosis, int hem, rec rpt 5 yrs [facility] Plan)  . COLONOSCOPY  01/2018   2 TA, mod diverticulosis, rpt 5 yrs [facility] Plan)  . POLYPECTOMY    . UMBILICAL HERNIA REPAIR  01/07/04    There were no vitals filed for this visit.    Patient arrived this date and had PT session however at the end of the session, patient had a syncopal episode, a code blue was called and patient was transported to the ER for evaluation.  Patient was not seen for OT visit this date.    Oneita Jolly, OTR/L, CLT                       OT Short Term Goals - 04/17/19 1036      OT SHORT TERM GOAL #1   Title  w        OT Long Term Goals - 04/12/19 0840      OT LONG TERM GOAL #1   Title  Patient will complete light homemaking tasks with modified independence using joint protection principles.    Baseline  unable at eval    Time  12    Period  Weeks    Status  New    Target Date  07/03/19      OT LONG TERM GOAL #2   Title  Patient will demonstrate light meal preparation with modified independence.    Baseline  unable at eval    Time  6    Period  Weeks    Status  New    Target Date   05/22/19      OT LONG TERM GOAL #3   Title  Patient will demonstrate cutting fruits and vegetables with modified techniques and joint protection principles as well as adaptive equipment as needed.    Baseline  unable at eval    Time  12    Period  Weeks    Status  New    Target Date  07/03/19      OT LONG TERM GOAL #4   Title  Patient will demonstrate hand writing for name address and phone number with 90% legibility    Baseline  decreased legibility at eval    Time  12    Period  Weeks    Status  New    Target Date  07/03/19      OT LONG TERM GOAL #5   Title  Patient will demonstrate improved grip strength by 10 pounds to open jars and containers and to grip steering wheel.    Baseline  decreased at eval  Time  12    Period  Weeks    Status  New    Target Date  07/03/19      Long Term Additional Goals   Additional Long Term Goals  Yes      OT LONG TERM GOAL #6   Title  Patient will complete donning and doffing socks with modified independence.    Baseline  increased difficulty at eval    Time  6    Period  Weeks    Status  New    Target Date  05/22/19              Patient will benefit from skilled therapeutic intervention in order to improve the following deficits and impairments:           Visit Diagnosis: 1. Muscle weakness (generalized)       Problem List Patient Active Problem List   Diagnosis Date Noted  . Syncope and collapse 04/24/2019  . Weakness of both lower extremities 02/24/2019  . Low energy 02/24/2019  . Seropositive rheumatoid arthritis ([place]) 01/04/2019  . High risk medication use 01/04/2019  . Current chronic use of systemic steroids 12/21/2018  . Lower extremity edema 12/21/2018  . 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 BPH (benign prostatic hyperplasia) 12/13/2018  . Thoracic aorta atherosclerosis ([place]) 06/15/2018  . Pedal edema 05/10/2018  . 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 Left foot pain 11/15/2015  .  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 Hypertensive retinopathy of both eyes, grade 1   . Health maintenance examination 08/10/2014  . Advanced care planning/counseling discussion 08/10/2014  . Bilateral hearing loss due to cerumen impaction 08/10/2014  . Essential tremor 09/09/2012  . Medicare annual wellness visit, subsequent 08/07/2011  . History of colonic polyps   . Diverticulosis of colon   . HTN (hypertension)   . HLD (hyperlipidemia)   . Controlled type 2 diabetes mellitus without complication, without long-term current use of insulin ([place]) 08/11/2010  . Vitamin D deficiency 08/07/2010  . 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 Elevated PSA 02/16/2007   Achilles Dunk, OTR/L, CLT  , 04/25/2019, 4:30 PM  [place] [facility] SERVICES [address] [place], [place], [address] Phone: [phone]   Fax:  [phone]  Name: DALLAS TOROK MRN: [id number] Date of Birth: [birth year]

## 2019-04-25 NOTE — Progress Notes (Signed)
Patient discharged home as ordered,instructions explained and well understood,escorted by staff member and spouse via wheelchair

## 2019-04-25 NOTE — Consult Note (Signed)
Reason for Consult:syncope Referring Physician: hospitalist  CC: syncope  HPI:   78 year old male with pmhx of basal cell carcinoma, hyperlipidemia, hypertension, hypertensive retinopathy of both eyes, and diabetes mellitus type 2 presenting to the ED from rehabilitation at [facility] with syncopal episode.  Patient was having  PT session performing third round of gait training in the hallway when he started feeling very lightheaded and needed to sit.  After a few seconds of sitting in the chair he lost consciousness and began snoring and shaking slightly.  Rapid response was called and patient was lowered to the ground and feet elevated, at that point he regained consciousness.  He was noted to have lost control of his bladder and per PT he smelled like he may have lost his bowel but was not checked.  Initial blood pressure was normal however when rechecked was found to be in the 40J systolic with a heart rate dropping from 60 to 30s beats per minute at that time CODE BLUE was called but by the time the code team arrived heart rate had recovered but BP still remained low.  He was transported to the ED by code/rapid response team.  On arrival to the ED, he was afebrile with blood pressure 88/61 mm Hg and pulse rate 67 beats/min. There were no focal neurological deficits; he was alert and oriented x4, and he did not demonstrate any memory deficits.  He however did not recall the events prior to or following the episode.  Initial labs revealed glucose of 156, WBC 15.1, sodium 132, troponin 15.  Chest x-ray showed cardiomegaly with no active cardiopulmonary process.   Patient head ct/cta; No acute head CT finding. Age related atrophy and mild chronicsmall-vessel ischemic change of the white matter.Atherosclerotic change of the brachiocephalic vessels. No carotidbifurcation region stenosis. Atherosclerotic calcification in thecarotid siphon regions, proximal basilar artery and vertebral arteryV4 segments but  without flow limiting stenosis. No large or mediumvessel intracranial occlusion  EEG; study is within normal limits. However, only wakefulness and drowsiness were recorded. If suspicion for interictal activity remains a concern, a prolonged study including sleep should be considered. No seizures or epileptiform discharges were seen throughout the recording.  Echo [pending  Patient states that he is feelin well, had couple round of walking around the hallway.   Past Medical History:  Diagnosis Date  . Basal cell carcinoma 12/2016   R anterior tibia  . Cataract   . Diverticulosis of colon   . History of colonic polyps   . HLD (hyperlipidemia)   . HTN (hypertension)   . Hypertensive retinopathy of both eyes, grade 1    [name]  . 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 Prediabetes   . 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 CATARACT EXTRACTION, BILATERAL  06/2017  . COLONOSCOPY  11/2012   mild-mod diverticulosis, int hem, rec rpt 5 yrs [facility] Plan)  . COLONOSCOPY  01/2018   2 TA, mod diverticulosis, rpt 5 yrs Fuller Plan)  . POLYPECTOMY    . UMBILICAL HERNIA REPAIR  01/07/04    Family History  Problem Relation Age of Onset  . Hypertension Sister   . Colon cancer Sister 44  . Colon polyps Sister   . Heart attack Father 61  . Esophageal cancer Neg Hx   . Rectal cancer Neg Hx   . Stomach cancer Neg Hx     Social History:  reports that he quit smoking about  34 years ago. He has never used smokeless tobacco. He reports current alcohol use of about 2.0 - 3.0 standard drinks of alcohol per week. He reports that he does not use drugs.  Allergies  Allergen Reactions  . Naprosyn [Naproxen] Swelling    Fingers and ankle swelling    Medications: I have reviewed the patient's current medications.  ROS: No dizziness, no cp, nop Sob, no visual disturnaces Physical Examination: Blood pressure 112/67, pulse 83, temperature 97.7 F (36.5 C),  temperature source Oral, resp. rate 20, height 5\' 8"  (1.727 m), weight 95 kg, SpO2 96 %.  Neurologic ExaminationA Alert, awake, oriented x 4, speech is nle, following commands CN: PERLa, EOMI, VFF, no nystagmus, face symmetrical, face sensation is nle, uvula/tongue midline M=no otor deficit appreciated No sensroy deficit appreciated No coordination deficit appreciated DTR not checke Gait not cheked  Results for orders placed or performed during the hospital encounter of 04/24/19 (from the past 48 hour(s))  Glucose, capillary     Status: Abnormal   Collection Time: 04/24/19 11:08 AM  Result Value Ref Range   Glucose-Capillary 156 (H) 70 - 99 mg/dL   Comment 1 PT REHAB   CBC with Differential     Status: Abnormal   Collection Time: 04/24/19 11:36 AM  Result Value Ref Range   WBC 15.1 (H) 4.0 - 10.5 K/uL   RBC 4.68 4.22 - 5.81 MIL/uL   Hemoglobin 14.4 13.0 - 17.0 g/dL   HCT 43.5 39.0 - 52.0 %   MCV 92.9 80.0 - 100.0 fL   MCH 30.8 26.0 - 34.0 pg   MCHC 33.1 30.0 - 36.0 g/dL   RDW 14.6 11.5 - 15.5 %   Platelets 334 150 - 400 K/uL   nRBC 0.0 0.0 - 0.2 %   Neutrophils Relative % 74 %   Neutro Abs 11.1 (H) 1.7 - 7.7 K/uL   Lymphocytes Relative 15 %   Lymphs Abs 2.3 0.7 - 4.0 K/uL   Monocytes Relative 8 %   Monocytes Absolute 1.3 (H) 0.1 - 1.0 K/uL   Eosinophils Relative 0 %   Eosinophils Absolute 0.1 0.0 - 0.5 K/uL   Basophils Relative 1 %   Basophils Absolute 0.1 0.0 - 0.1 K/uL   Immature Granulocytes 2 %   Abs Immature Granulocytes 0.26 (H) 0.00 - 0.07 K/uL    Comment: Performed at [facility], [address] [place], [place] [address]  Comprehensive metabolic panel     Status: Abnormal   Collection Time: 04/24/19 11:36 AM  Result Value Ref Range   Sodium 132 (L) 135 - 145 mmol/L   Potassium 3.6 3.5 - 5.1 mmol/L   Chloride 96 (L) 98 - 111 mmol/L   CO2 23 22 - 32 mmol/L   Glucose, Bld 151 (H) 70 - 99 mg/dL   BUN 17 8 - 23 mg/dL   Creatinine, Ser 0.88 0.61 -  1.24 mg/dL   Calcium 9.7 8.9 - 10.3 mg/dL   Total Protein 6.8 6.5 - 8.1 g/dL   Albumin 3.5 3.5 - 5.0 g/dL   AST 26 15 - 41 U/L   ALT 22 0 - 44 U/L   Alkaline Phosphatase 74 38 - 126 U/L   Total Bilirubin 0.9 0.3 - 1.2 mg/dL   GFR calc non Af Amer >60 >60 mL/min   GFR calc Af Amer >60 >60 mL/min   Anion gap 13 5 - 15    Comment: Performed at [facility], [address] [place],  [place] [address]  Troponin I (High Sensitivity)     Status: None   Collection Time: 04/24/19 11:41 AM  Result Value Ref Range   Troponin I (High Sensitivity) 15 <18 ng/L    Comment: (NOTE) Elevated high sensitivity troponin I (hsTnI) values and significant  changes across serial measurements may suggest ACS but many other  chronic and acute conditions are known to elevate hsTnI results.  Refer to the "Links" section for chest pain algorithms and additional  guidance. Performed at [facility], [address] [place], [place] [address]   SARS Coronavirus 2 [facility] order, Performed in [facility] hospital lab) Nasopharyngeal Nasopharyngeal Swab     Status: None   Collection Time: 04/24/19  1:18 PM   Specimen: Nasopharyngeal Swab  Result Value Ref Range   SARS Coronavirus 2 NEGATIVE NEGATIVE    Comment: (NOTE) If result is NEGATIVE SARS-CoV-2 target nucleic acids are NOT DETECTED. The SARS-CoV-2 RNA is generally detectable in upper and lower  respiratory specimens during the acute phase of infection. The lowest  concentration of SARS-CoV-2 viral copies this assay can detect is 250  copies / mL. A negative result does not preclude SARS-CoV-2 infection  and should not be used as the sole basis for treatment or other  patient management decisions.  A negative result may occur with  improper specimen collection / handling, submission of specimen other  than nasopharyngeal swab, presence of viral mutation(s) within the  areas targeted by this assay, and inadequate number of viral  copies  (<250 copies / mL). A negative result must be combined with clinical  observations, patient history, and epidemiological information. If result is POSITIVE SARS-CoV-2 target nucleic acids are DETECTED. The SARS-CoV-2 RNA is generally detectable in upper and lower  respiratory specimens dur ing the acute phase of infection.  Positive  results are indicative of active infection with SARS-CoV-2.  Clinical  correlation with patient history and other diagnostic information is  necessary to determine patient infection status.  Positive results do  not rule out bacterial infection or co-infection with other viruses. If result is PRESUMPTIVE POSTIVE SARS-CoV-2 nucleic acids MAY BE PRESENT.   A presumptive positive result was obtained on the submitted specimen  and confirmed on repeat testing.  While 2019 novel coronavirus  (SARS-CoV-2) nucleic acids may be present in the submitted sample  additional confirmatory testing may be necessary for epidemiological  and / or clinical management purposes  to differentiate between  SARS-CoV-2 and other Sarbecovirus currently known to infect humans.  If clinically indicated additional testing with an alternate test  methodology [phone]) is advised. The SARS-CoV-2 RNA is generally  detectable in upper and lower respiratory sp ecimens during the acute  phase of infection. The expected result is Negative. Fact Sheet for Patients:  StrictlyIdeas.no Fact Sheet for Healthcare Providers: BankingDealers.co.za This test is not yet approved or cleared by the [place] FDA and has been authorized for detection and/or diagnosis of SARS-CoV-2 by FDA under an Emergency Use Authorization (EUA).  This EUA will remain in effect (meaning this test can be used) for the duration of the COVID-19 declaration under Section 564(b)(1) of the Act, 21 U.S.C. section 360bbb-3(b)(1), unless the authorization is terminated  or revoked sooner. Performed at [facility], [address] [place], [place] [address]   Glucose, capillary     Status: Abnormal   Collection Time: 04/24/19  7:04 PM  Result Value Ref Range   Glucose-Capillary 184 (H) 70 - 99 mg/dL  TSH     Status: None   Collection Time: 04/24/19  9:16 PM  Result Value Ref Range   TSH 1.673 0.350 - 4.500 uIU/mL    Comment: Performed by a 3rd Generation assay with a functional sensitivity of <=0.01 uIU/mL. Performed at [facility], [place], [place] [address]   Troponin I (High Sensitivity)     Status: None   Collection Time: 04/24/19  9:16 PM  Result Value Ref Range   Troponin I (High Sensitivity) 16 <18 ng/L    Comment: (NOTE) Elevated high sensitivity troponin I (hsTnI) values and significant  changes across serial measurements may suggest ACS but many other  chronic and acute conditions are known to elevate hsTnI results.  Refer to the "Links" section for chest pain algorithms and additional  guidance. Performed at [facility], [place], [place] [address]   Glucose, capillary     Status: Abnormal   Collection Time: 04/24/19  9:57 PM  Result Value Ref Range   Glucose-Capillary 143 (H) 70 - 99 mg/dL  Troponin I (High Sensitivity)     Status: None   Collection Time: 04/24/19 11:13 PM  Result Value Ref Range   Troponin I (High Sensitivity) 17 <18 ng/L    Comment: (NOTE) Elevated high sensitivity troponin I (hsTnI) values and significant  changes across serial measurements may suggest ACS but many other  chronic and acute conditions are known to elevate hsTnI results.  Refer to the "Links" section for chest pain algorithms and additional  guidance. Performed at [facility], [address] [place], [place] [address]   Glucose, capillary     Status: Abnormal   Collection Time: 04/25/19  4:02 AM  Result Value Ref Range   Glucose-Capillary 107 (H) 70 - 99 mg/dL   Urinalysis, Complete w Microscopic     Status: Abnormal   Collection Time: 04/25/19  4:15 AM  Result Value Ref Range   Color, Urine YELLOW (A) YELLOW   APPearance CLEAR (A) CLEAR   Specific Gravity, Urine 1.026 1.005 - 1.030   pH 5.0 5.0 - 8.0   Glucose, UA NEGATIVE NEGATIVE mg/dL   Hgb urine dipstick SMALL (A) NEGATIVE   Bilirubin Urine NEGATIVE NEGATIVE   Ketones, ur NEGATIVE NEGATIVE mg/dL   Protein, ur NEGATIVE NEGATIVE mg/dL   Nitrite NEGATIVE NEGATIVE   Leukocytes,Ua NEGATIVE NEGATIVE   RBC / HPF 11-20 0 - 5 RBC/hpf   WBC, UA 0-5 0 - 5 WBC/hpf   Bacteria, UA NONE SEEN NONE SEEN   Squamous Epithelial / LPF 0-5 0 - 5   Mucus PRESENT     Comment: Performed at [facility], [address] [place], [place] [address]  CBC with Differential/Platelet     Status: Abnormal   Collection Time: 04/25/19  4:16 AM  Result Value Ref Range   WBC 14.0 (H) 4.0 - 10.5 K/uL   RBC 4.72 4.22 - 5.81 MIL/uL   Hemoglobin 14.4 13.0 - 17.0 g/dL   HCT 44.4 39.0 - 52.0 %   MCV 94.1 80.0 - 100.0 fL   MCH 30.5 26.0 - 34.0 pg   MCHC 32.4 30.0 - 36.0 g/dL   RDW 14.8 11.5 - 15.5 %   Platelets 318 150 - 400 K/uL   nRBC 0.0 0.0 - 0.2 %   Neutrophils Relative % 66 %   Neutro Abs 9.4 (H) 1.7 - 7.7 K/uL   Lymphocytes Relative 22 %   Lymphs Abs 3.0  0.7 - 4.0 K/uL   Monocytes Relative 8 %   Monocytes Absolute 1.1 (H) 0.1 - 1.0 K/uL   Eosinophils Relative 1 %   Eosinophils Absolute 0.1 0.0 - 0.5 K/uL   Basophils Relative 1 %   Basophils Absolute 0.1 0.0 - 0.1 K/uL   Immature Granulocytes 2 %   Abs Immature Granulocytes 0.32 (H) 0.00 - 0.07 K/uL    Comment: Performed at [facility], [address] [place], [place] [address]  Basic metabolic panel     Status: Abnormal   Collection Time: 04/25/19  4:16 AM  Result Value Ref Range   Sodium 135 135 - 145 mmol/L   Potassium 3.4 (L) 3.5 - 5.1 mmol/L   Chloride 97 (L) 98 - 111 mmol/L   CO2 28 22 - 32 mmol/L   Glucose, Bld 107 (H) 70 -  99 mg/dL   BUN 17 8 - 23 mg/dL   Creatinine, Ser 0.73 0.61 - 1.24 mg/dL   Calcium 9.8 8.9 - 10.3 mg/dL   GFR calc non Af Amer >60 >60 mL/min   GFR calc Af Amer >60 >60 mL/min   Anion gap 10 5 - 15    Comment: Performed at [facility], [address] [place], [place] [address]  Glucose, capillary     Status: Abnormal   Collection Time: 04/25/19  7:24 AM  Result Value Ref Range   Glucose-Capillary 111 (H) 70 - 99 mg/dL  Glucose, capillary     Status: Abnormal   Collection Time: 04/25/19 11:13 AM  Result Value Ref Range   Glucose-Capillary 139 (H) 70 - 99 mg/dL    Recent Results (from the past 240 hour(s))  SARS Coronavirus 2 [facility] order, Performed in [facility] hospital lab) Nasopharyngeal Nasopharyngeal Swab     Status: None   Collection Time: 04/24/19  1:18 PM   Specimen: Nasopharyngeal Swab  Result Value Ref Range Status   SARS Coronavirus 2 NEGATIVE NEGATIVE Final    Comment: (NOTE) If result is NEGATIVE SARS-CoV-2 target nucleic acids are NOT DETECTED. The SARS-CoV-2 RNA is generally detectable in upper and lower  respiratory specimens during the acute phase of infection. The lowest  concentration of SARS-CoV-2 viral copies this assay can detect is 250  copies / mL. A negative result does not preclude SARS-CoV-2 infection  and should not be used as the sole basis for treatment or other  patient management decisions.  A negative result may occur with  improper specimen collection / handling, submission of specimen other  than nasopharyngeal swab, presence of viral mutation(s) within the  areas targeted by this assay, and inadequate number of viral copies  (<250 copies / mL). A negative result must be combined with clinical  observations, patient history, and epidemiological information. If result is POSITIVE SARS-CoV-2 target nucleic acids are DETECTED. The SARS-CoV-2 RNA is generally detectable in upper and lower  respiratory specimens dur ing the acute  phase of infection.  Positive  results are indicative of active infection with SARS-CoV-2.  Clinical  correlation with patient history and other diagnostic information is  necessary to determine patient infection status.  Positive results do  not rule out bacterial infection or co-infection with other viruses. If result is PRESUMPTIVE POSTIVE SARS-CoV-2 nucleic acids MAY BE PRESENT.   A presumptive positive result was obtained on the submitted specimen  and confirmed on repeat testing.  While 2019 novel coronavirus  (SARS-CoV-2) nucleic acids may be present in the submitted sample  additional confirmatory  testing may be necessary for epidemiological  and / or clinical management purposes  to differentiate between  SARS-CoV-2 and other Sarbecovirus currently known to infect humans.  If clinically indicated additional testing with an alternate test  methodology [phone]) is advised. The SARS-CoV-2 RNA is generally  detectable in upper and lower respiratory sp ecimens during the acute  phase of infection. The expected result is Negative. Fact Sheet for Patients:  StrictlyIdeas.no Fact Sheet for Healthcare Providers: BankingDealers.co.za This test is not yet approved or cleared by the [place] FDA and has been authorized for detection and/or diagnosis of SARS-CoV-2 by FDA under an Emergency Use Authorization (EUA).  This EUA will remain in effect (meaning this test can be used) for the duration of the COVID-19 declaration under Section 564(b)(1) of the Act, 21 U.S.C. section 360bbb-3(b)(1), unless the authorization is terminated or revoked sooner. Performed at [facility], [place], [place] [address]     Ct Angio Head W Or Wo Contrast  Result Date: 04/24/2019 CLINICAL DATA:  Episode of hypotension and loss of consciousness. Dizziness. EXAM: CT ANGIOGRAPHY HEAD AND NECK TECHNIQUE: Multidetector CT imaging of  the head and neck was performed using the standard protocol during bolus administration of intravenous contrast. Multiplanar CT image reconstructions and MIPs were obtained to evaluate the vascular anatomy. Carotid stenosis measurements (when applicable) are obtained utilizing NASCET criteria, using the distal internal carotid diameter as the denominator. CONTRAST:  26mL OMNIPAQUE IOHEXOL 350 MG/ML SOLN COMPARISON:  None. FINDINGS: CT HEAD FINDINGS Brain: Age related atrophy. Mild chronic small-vessel ischemic change of the white matter. No large vessel territory infarction. No mass lesion, hemorrhage, hydrocephalus or extra-axial collection. Vascular: There is atherosclerotic calcification of the major vessels at the base of the brain. Skull: Negative Sinuses: Clear/normal Orbits: None CTA NECK FINDINGS Aortic arch: Aortic atherosclerosis. No aneurysm or dissection. Branching pattern is normal, with the left vertebral artery arising directly from the arch. Right carotid system: Common carotid artery widely patent to the bifurcation. Calcified plaque at the carotid bifurcation and ICA bulb but no stenosis. Cervical ICA widely patent to the skull base. Left carotid system: Common carotid artery widely patent to the bifurcation. Carotid bifurcation shows calcified plaque. No stenosis. Cervical ICA widely patent. Vertebral arteries: No vertebral artery origin stenosis. Both vertebral arteries are patent through the cervical region to the foramen magnum. Skeleton: Ordinary cervical spondylosis. Other neck: No mass or lymphadenopathy. Upper chest: Normal Review of the MIP images confirms the above findings CTA HEAD FINDINGS Anterior circulation: Both internal carotid arteries are patent through the skull base and siphon regions. Siphon region show atherosclerotic calcification but no stenosis greater than 30%. The anterior and middle cerebral vessels are patent without proximal stenosis, aneurysm or vascular  malformation. No large or medium vessel occlusion. Posterior circulation: Both vertebral arteries are widely patent to the basilar. There is atherosclerotic calcification in the vertebral artery V4 segments but no stenosis greater than 30%. Atherosclerotic plaque at the proximal basilar artery but without flow limiting stenosis. Posterior circulation branch vessels are normal. Venous sinuses: Patent Anatomic variants: None Review of the MIP images confirms the above findings IMPRESSION: No acute head CT finding. Age related atrophy and mild chronic small-vessel ischemic change of the white matter. Atherosclerotic change of the brachiocephalic vessels. No carotid bifurcation region stenosis. Atherosclerotic calcification in the carotid siphon regions, proximal basilar artery and vertebral artery V4 segments but without flow limiting stenosis. No large or medium vessel intracranial occlusion Electronically Signed  By: Nelson Chimes M.D.   On: 04/24/2019 14:51   Ct Angio Neck W Or Wo Contrast  Result Date: 04/24/2019 CLINICAL DATA:  Episode of hypotension and loss of consciousness. Dizziness. EXAM: CT ANGIOGRAPHY HEAD AND NECK TECHNIQUE: Multidetector CT imaging of the head and neck was performed using the standard protocol during bolus administration of intravenous contrast. Multiplanar CT image reconstructions and MIPs were obtained to evaluate the vascular anatomy. Carotid stenosis measurements (when applicable) are obtained utilizing NASCET criteria, using the distal internal carotid diameter as the denominator. CONTRAST:  31mL OMNIPAQUE IOHEXOL 350 MG/ML SOLN COMPARISON:  None. FINDINGS: CT HEAD FINDINGS Brain: Age related atrophy. Mild chronic small-vessel ischemic change of the white matter. No large vessel territory infarction. No mass lesion, hemorrhage, hydrocephalus or extra-axial collection. Vascular: There is atherosclerotic calcification of the major vessels at the base of the brain. Skull: Negative  Sinuses: Clear/normal Orbits: None CTA NECK FINDINGS Aortic arch: Aortic atherosclerosis. No aneurysm or dissection. Branching pattern is normal, with the left vertebral artery arising directly from the arch. Right carotid system: Common carotid artery widely patent to the bifurcation. Calcified plaque at the carotid bifurcation and ICA bulb but no stenosis. Cervical ICA widely patent to the skull base. Left carotid system: Common carotid artery widely patent to the bifurcation. Carotid bifurcation shows calcified plaque. No stenosis. Cervical ICA widely patent. Vertebral arteries: No vertebral artery origin stenosis. Both vertebral arteries are patent through the cervical region to the foramen magnum. Skeleton: Ordinary cervical spondylosis. Other neck: No mass or lymphadenopathy. Upper chest: Normal Review of the MIP images confirms the above findings CTA HEAD FINDINGS Anterior circulation: Both internal carotid arteries are patent through the skull base and siphon regions. Siphon region show atherosclerotic calcification but no stenosis greater than 30%. The anterior and middle cerebral vessels are patent without proximal stenosis, aneurysm or vascular malformation. No large or medium vessel occlusion. Posterior circulation: Both vertebral arteries are widely patent to the basilar. There is atherosclerotic calcification in the vertebral artery V4 segments but no stenosis greater than 30%. Atherosclerotic plaque at the proximal basilar artery but without flow limiting stenosis. Posterior circulation branch vessels are normal. Venous sinuses: Patent Anatomic variants: None Review of the MIP images confirms the above findings IMPRESSION: No acute head CT finding. Age related atrophy and mild chronic small-vessel ischemic change of the white matter. Atherosclerotic change of the brachiocephalic vessels. No carotid bifurcation region stenosis. Atherosclerotic calcification in the carotid siphon regions, proximal  basilar artery and vertebral artery V4 segments but without flow limiting stenosis. No large or medium vessel intracranial occlusion Electronically Signed   By: Nelson Chimes M.D.   On: 04/24/2019 14:51   Dg Chest Portable 1 View  Result Date: 04/24/2019 CLINICAL DATA:  Syncopal episode. EXAM: PORTABLE CHEST 1 VIEW COMPARISON:  No recent prior. FINDINGS: Mediastinum hilar structures normal. Cardiomegaly with normal pulmonary vascularity. Low lung volumes. No focal alveolar infiltrate. No pleural effusion pneumothorax. IMPRESSION: 1.  Cardiomegaly.  No pulmonary venous congestion. 2.  Low lung volumes. Electronically Signed   By: Marcello Moores  Register   On: 04/24/2019 13:31     Assessment/Plan: 78 year old male with pmhx of basal cell carcinoma, hyperlipidemia, hypertension, hypertensive retinopathy of both eyes, and diabetes mellitus type 2 admitted for syncope. Neuro exam w/o focalities, EEG with no sz. Head ct /cta w/o acute abnlities.  Most likely vasovagal syncope, but other causes should be sought such as infection, dehydration, electrolytes /metabolci abnlities. Patient has diabetes wich could lead to dysautonomia  if not well controlled. Ischemic event also iare in differentiel but less likely given his neurological exam. oabtain MRI of brain  04/25/2019, 2:07 PM

## 2019-04-25 NOTE — Discharge Instructions (Signed)
Near-Syncope Near-syncope is when you suddenly get weak or dizzy, or you feel like you might pass out (faint). This may also be called presyncope. This is due to a lack of blood flow to the brain. During an episode of near-syncope, you may:  Feel dizzy, weak, or light-headed.  Feel sick to your stomach (nauseous).  See all white or all black.  See spots.  Have cold, clammy skin. This condition is caused by a sudden decrease in blood flow to the brain. This decrease can result from various causes, but most of those causes are not dangerous. However, near-syncope may be a sign of a serious medical problem, so it is important to seek medical care. Follow these instructions at home: Medicines  Take over-the-counter and prescription medicines only as told by your doctor.  If you are taking blood pressure or heart medicine, get up slowly and spend many minutes getting ready to sit and then stand. This can help with dizziness. General instructions  Be aware of any changes in your symptoms.  Talk with your doctor about your symptoms. You may need to have testing to find the cause of your near-syncope.  If you start to feel like you might pass out, lie down right away. Raise (elevate) your feet above the level of your heart. Breathe deeply and steadily. Wait until all of the symptoms are gone.  Have someone stay with you until you feel stable.  Do not drive, use machinery, or play sports until your doctor says it is okay.  Drink enough fluid to keep your pee (urine) pale yellow.  Keep all follow-up visits as told by your doctor. This is important. Get help right away if you:  Have a seizure.  Have pain in your: ? Chest. ? Belly (abdomen). ? Back.  Faint once or more than once.  Have a very bad headache.  Are bleeding from your mouth or butt.  Have black or tarry poop (stool).  Have a very fast or uneven heartbeat (palpitations).  Are mixed up (confused).  Have trouble  walking.  Are very weak.  Have trouble seeing. These symptoms may be an emergency. Do not wait to see if the symptoms will go away. Get medical help right away. Call your local emergency services (911 in the [place] Do not drive yourself to the hospital. Summary  Near-syncope is when you suddenly get weak or dizzy, or you feel like you might pass out (faint).  This condition is caused by a lack of blood flow to the brain.  Near-syncope may be a sign of a serious medical problem, so it is important to seek medical care. This information is not intended to replace advice given to you by your health care provider. Make sure you discuss any questions you have with your health care provider. Document Released: 02/24/2008 Document Revised: 12/30/2018 Document Reviewed: 07/27/2018 Elsevier Patient Education  2020 [employer].

## 2019-04-25 NOTE — Progress Notes (Signed)
*  PRELIMINARY RESULTS* Echocardiogram 2D Echocardiogram has been performed.  Wallie Char Aldeen Riga 04/25/2019, 11:24 AM

## 2019-04-26 ENCOUNTER — Telehealth: Payer: Self-pay

## 2019-04-26 ENCOUNTER — Ambulatory Visit: Payer: PPO

## 2019-04-26 NOTE — Telephone Encounter (Signed)
Transition Care Management Follow-up Telephone Call   Date discharged?04/25/19  How have you been since you were released from the hospital? I am doing okay but I know my potasium is kind of low and I am working on that.  I do take a potasium pill.    Do you understand why you were in the hospital? Yes   Do you understand the discharge instructions? Yes   Where were you discharged to? Home   Items Reviewed:  Medications reviewed: Yes  Allergies reviewed: Yes  Dietary changes reviewed: No changes to diet  Referrals reviewed: Referral to Neurology    Functional Questionnaire:  Activities of Daily Living (ADLs):   He states they are independent in the following: ambulation as needed with cane walker but often doesn't need it.   States they require assistance with the following:  He is independent with all of his ADLs and does continue to work with PT to increase independence.    Any transportation issues/concerns?: None   Any patient concerns? Will discuss his questions with Dr. Darnell Level at appointment on Friday.    Confirmed importance and date/time of follow-up visits scheduled Yes  Provider Appointment booked with Dr. Danise Mina on Friday 04/28/19.    Confirmed with patient if condition begins to worsen call PCP or go to the ER.  Patient was given the office number and encouraged to call back with question or concerns.  : Yes.

## 2019-04-27 NOTE — Discharge Summary (Signed)
[facility] NAME: Blake Peterson    MR#:  [id number]  DATE OF BIRTH:  Jan 25, 1941  DATE OF ADMISSION:  04/24/2019   ADMITTING PHYSICIAN: Lang Snow, NP  DATE OF DISCHARGE: 04/25/2019  5:08 PM  PRIMARY CARE PHYSICIAN: Ria Bush, MD   ADMISSION DIAGNOSIS:  Syncope and collapse [R55] DISCHARGE DIAGNOSIS:  Active Problems:   Syncope and collapse  SECONDARY DIAGNOSIS:   Past Medical History:  Diagnosis Date  . Basal cell carcinoma 12/2016   R anterior tibia  . Cataract   . Diverticulosis of colon   . History of colonic polyps   . HLD (hyperlipidemia)   . HTN (hypertension)   . Hypertensive retinopathy of both eyes, grade 1    [name]  . 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 Prediabetes   . Wears hearing aid in left ear    HOSPITAL COURSE:  78 y.o. male past medical history of basal cell carcinoma, hyperlipidemia, hypertension, hypertensive retinopathy of both eyes, and diabetes mellitus type 2 admitted for syncopal episode.  1. Syncope and collapse- Most likely vasovagal syncope - Neuro exam w/o focalities, EEG with no sz. Head ct /cta w/o acute abnlities.  - Patient has diabetes wich could lead to dysautonomia if not well controlled. Ischemic event also iare in differentiel but less likely given his neurological exam.  -I offered him to check MRI of brain but he refused and wanted to leave.  He has agreed to follow-up with outpatient neurology and consider MRI then if needed  2. Leukocytosis : Stress response - No signs of infectious process - CXR negative for acute cardiopulmonary process  3.HLD  + Goal LDL<100 - Atorvastatin 80mg  PO qhs  4.HTN-controlled on home regimen  5. DM type II: His sugars have been well-controlled on current regimen   DISCHARGE CONDITIONS:  Stable CONSULTS OBTAINED:  Treatment Team:  Teodoro Spray, MD Neville Route, MD DRUG ALLERGIES:    Allergies  Allergen Reactions  . Naprosyn [Naproxen] Swelling    Fingers and ankle swelling   DISCHARGE MEDICATIONS:   Allergies as of 04/25/2019      Reactions   Naprosyn [naproxen] Swelling   Fingers and ankle swelling      Medication List    TAKE these medications   acetaminophen 500 MG tablet Commonly known as: TYLENOL Take 1 tablet (500 mg total) by mouth every 6 (six) hours as needed for moderate pain.   aspirin 81 MG tablet Take 81 mg by mouth daily.   atenolol 50 MG tablet Commonly known as: TENORMIN TAKE 1 TABLET (50 MG TOTAL) BY MOUTH DAILY.   atorvastatin 80 MG tablet Commonly known as: LIPITOR TAKE 1 TABLET (80 MG TOTAL) BY MOUTH AT BEDTIME.   chlorthalidone 25 MG tablet Commonly known as: HYGROTON TAKE 1 TABLET (25 MG TOTAL) BY MOUTH DAILY.   cholecalciferol 1000 units tablet Commonly known as: VITAMIN D Take 1,000 Units by mouth daily.   enalapril 10 MG tablet Commonly known as: VASOTEC TAKE 1 TABLET BY MOUTH TWICE A DAY   folic acid 1 MG tablet Commonly known as: FOLVITE Take 1 mg by mouth daily.   glimepiride 1 MG tablet Commonly known as: AMARYL TAKE 1 TABLET (1 MG TOTAL) BY MOUTH DAILY WITH BREAKFAST.   Klor-Con M10 10 MEQ tablet Generic drug: potassium chloride TAKE 1 TABLET BY MOUTH EVERY DAY   methotrexate 2.5 MG tablet  Commonly known as: RHEUMATREX Take 20 mg by mouth once a week. Caution:Chemotherapy. Protect from light.  >  On Thursdays>  8 tablets   predniSONE 5 MG tablet Commonly known as: DELTASONE Take 5 mg by mouth as needed.        DISCHARGE INSTRUCTIONS:   DIET:  Low fat, Low cholesterol diet DISCHARGE CONDITION:  Stable ACTIVITY:  Activity as tolerated OXYGEN:  Home Oxygen: No.  Oxygen Delivery: room air DISCHARGE LOCATION:  home   If you experience worsening of your admission symptoms, develop shortness of breath, life threatening emergency, suicidal or homicidal thoughts you must seek medical attention  immediately by calling 911 or calling your MD immediately  if symptoms less severe.  You Must read complete instructions/literature along with all the possible adverse reactions/side effects for all the Medicines you take and that have been prescribed to you. Take any new Medicines after you have completely understood and accpet all the possible adverse reactions/side effects.   Please note  You were cared for by a hospitalist during your hospital stay. If you have any questions about your discharge medications or the care you received while you were in the hospital after you are discharged, you can call the unit and asked to speak with the hospitalist on call if the hospitalist that took care of you is not available. Once you are discharged, your primary care physician will handle any further medical issues. Please note that NO REFILLS for any discharge medications will be authorized once you are discharged, as it is imperative that you return to your primary care physician (or establish a relationship with a primary care physician if you do not have one) for your aftercare needs so that they can reassess your need for medications and monitor your lab values.    On the day of Discharge:  VITAL SIGNS:  Blood pressure 114/64, pulse 90, temperature 97.8 F (36.6 C), temperature source Oral, resp. rate 19, height 5\' 8"  (1.727 m), weight 95 kg, SpO2 99 %. PHYSICAL EXAMINATION:  GENERAL:  78 y.o.-year-old patient lying in the bed with no acute distress.  EYES: Pupils equal, round, reactive to light and accommodation. No scleral icterus. Extraocular muscles intact.  HEENT: Head atraumatic, normocephalic. Oropharynx and nasopharynx clear.  NECK:  Supple, no jugular venous distention. No thyroid enlargement, no tenderness.  LUNGS: Normal breath sounds bilaterally, no wheezing, rales,rhonchi or crepitation. No use of accessory muscles of respiration.  CARDIOVASCULAR: S1, S2 normal. No murmurs, rubs, or  gallops.  ABDOMEN: Soft, non-tender, non-distended. Bowel sounds present. No organomegaly or mass.  EXTREMITIES: No pedal edema, cyanosis, or clubbing.  NEUROLOGIC: Cranial nerves II through XII are intact. Muscle strength 5/5 in all extremities. Sensation intact. Gait not checked.  PSYCHIATRIC: The patient is alert and oriented x 3.  SKIN: No obvious rash, lesion, or ulcer.  DATA REVIEW:   CBC Recent Labs  Lab 04/25/19 0416  WBC 14.0*  HGB 14.4  HCT 44.4  PLT 318    Chemistries  Recent Labs  Lab 04/24/19 1136 04/25/19 0416  NA 132* 135  K 3.6 3.4*  CL 96* 97*  CO2 23 28  GLUCOSE 151* 107*  BUN 17 17  CREATININE 0.88 0.73  CALCIUM 9.7 9.8  AST 26  --   ALT 22  --   ALKPHOS 74  --   BILITOT 0.9  --      Microbiology Results  Results for orders placed or performed during the hospital encounter of 04/24/19  SARS Coronavirus 2 [facility] order, Performed in [facility] hospital lab) Nasopharyngeal Nasopharyngeal Swab     Status: None   Collection Time: 04/24/19  1:18 PM   Specimen: Nasopharyngeal Swab  Result Value Ref Range Status   SARS Coronavirus 2 NEGATIVE NEGATIVE Final    Comment: (NOTE) If result is NEGATIVE SARS-CoV-2 target nucleic acids are NOT DETECTED. The SARS-CoV-2 RNA is generally detectable in upper and lower  respiratory specimens during the acute phase of infection. The lowest  concentration of SARS-CoV-2 viral copies this assay can detect is 250  copies / mL. A negative result does not preclude SARS-CoV-2 infection  and should not be used as the sole basis for treatment or other  patient management decisions.  A negative result may occur with  improper specimen collection / handling, submission of specimen other  than nasopharyngeal swab, presence of viral mutation(s) within the  areas targeted by this assay, and inadequate number of viral copies  (<250 copies / mL). A negative result must be combined with clinical  observations, patient  history, and epidemiological information. If result is POSITIVE SARS-CoV-2 target nucleic acids are DETECTED. The SARS-CoV-2 RNA is generally detectable in upper and lower  respiratory specimens dur ing the acute phase of infection.  Positive  results are indicative of active infection with SARS-CoV-2.  Clinical  correlation with patient history and other diagnostic information is  necessary to determine patient infection status.  Positive results do  not rule out bacterial infection or co-infection with other viruses. If result is PRESUMPTIVE POSTIVE SARS-CoV-2 nucleic acids MAY BE PRESENT.   A presumptive positive result was obtained on the submitted specimen  and confirmed on repeat testing.  While 2019 novel coronavirus  (SARS-CoV-2) nucleic acids may be present in the submitted sample  additional confirmatory testing may be necessary for epidemiological  and / or clinical management purposes  to differentiate between  SARS-CoV-2 and other Sarbecovirus currently known to infect humans.  If clinically indicated additional testing with an alternate test  methodology [phone]) is advised. The SARS-CoV-2 RNA is generally  detectable in upper and lower respiratory sp ecimens during the acute  phase of infection. The expected result is Negative. Fact Sheet for Patients:  StrictlyIdeas.no Fact Sheet for Healthcare Providers: BankingDealers.co.za This test is not yet approved or cleared by the [place] FDA and has been authorized for detection and/or diagnosis of SARS-CoV-2 by FDA under an Emergency Use Authorization (EUA).  This EUA will remain in effect (meaning this test can be used) for the duration of the COVID-19 declaration under Section 564(b)(1) of the Act, 21 U.S.C. section 360bbb-3(b)(1), unless the authorization is terminated or revoked sooner. Performed at [facility], [address] [place], [place] [address]      RADIOLOGY:  No results found.   Management plans discussed with the patient, family and they are in agreement.  CODE STATUS: Prior   TOTAL TIME TAKING CARE OF THIS PATIENT: 45 minutes.    Max Sane M.D on 04/27/2019 at 1:38 PM  Between 7am to 6pm - Pager - [phone]  After 6pm go to [email or web address] - Proofreader  [facility]  Office  [phone]  CC: Primary care physician; Ria Bush, MD   Note: This dictation was prepared with Dragon dictation along with smaller phrase technology. Any transcriptional errors that result from this process are unintentional.

## 2019-04-28 ENCOUNTER — Encounter: Payer: Self-pay | Admitting: Family Medicine

## 2019-04-28 ENCOUNTER — Other Ambulatory Visit: Payer: Self-pay

## 2019-04-28 ENCOUNTER — Ambulatory Visit (INDEPENDENT_AMBULATORY_CARE_PROVIDER_SITE_OTHER): Payer: PPO | Admitting: Family Medicine

## 2019-04-28 VITALS — BP 122/74 | HR 73 | Temp 97.6°F | Ht 68.5 in | Wt 210.4 lb

## 2019-04-28 DIAGNOSIS — I10 Essential (primary) hypertension: Secondary | ICD-10-CM | POA: Diagnosis not present

## 2019-04-28 DIAGNOSIS — R972 Elevated prostate specific antigen [PSA]: Secondary | ICD-10-CM

## 2019-04-28 DIAGNOSIS — R319 Hematuria, unspecified: Secondary | ICD-10-CM | POA: Diagnosis not present

## 2019-04-28 DIAGNOSIS — R55 Syncope and collapse: Secondary | ICD-10-CM

## 2019-04-28 DIAGNOSIS — E119 Type 2 diabetes mellitus without complications: Secondary | ICD-10-CM

## 2019-04-28 LAB — POC URINALSYSI DIPSTICK (AUTOMATED)
Bilirubin, UA: NEGATIVE
Glucose, UA: NEGATIVE
Ketones, UA: NEGATIVE
Leukocytes, UA: NEGATIVE
Nitrite, UA: NEGATIVE
Protein, UA: NEGATIVE
Spec Grav, UA: 1.025 (ref 1.010–1.025)
Urobilinogen, UA: 1 E.U./dL
pH, UA: 6 (ref 5.0–8.0)

## 2019-04-28 NOTE — Assessment & Plan Note (Addendum)
Continue amaryl 1mg  daily. Denies hypoglycemia. Only on PRN prednisone.

## 2019-04-28 NOTE — Assessment & Plan Note (Addendum)
Incidentally noted during recent hospitalization. Will repeat UA today - persists so will refer to urology for further evaluation.

## 2019-04-28 NOTE — Assessment & Plan Note (Signed)
Presumed vasovagal. Overall reassuring hospital evaluation which was reviewed with patient. Will await MRI planned next Wednesday. If normal, will watch for now. Pt agrees with plan.

## 2019-04-28 NOTE — Progress Notes (Signed)
This visit was conducted in person.  BP 122/74 (BP Location: Left Arm, Patient Position: Sitting, Cuff Size: Normal)   Pulse 73   Temp 97.6 F (36.4 C) (Temporal)   Ht 5' 8.5" (1.74 m)   Wt 210 lb 6 oz (95.4 kg)   SpO2 99%   BMI 31.52 kg/m   Orthostatic VS for the past 24 hrs (Last 3 readings):  BP- Lying BP- Standing at 0 minutes  04/28/19 1130 - 114/70  04/28/19 1128 118/70 -    CC: hosp f/u visit Subjective:    Patient ID: Blake Peterson, male    DOB: 12-Mar-1941, 78 y.o.   MRN: [id number]  HPI: Blake Peterson is a 78 y.o. male presenting on 04/28/2019 for Hospitalization Follow-up   Recent hospitalization for syncope while at outpatient hospital PT associated with marked hypotension. Had work up including EEG without seizure, head CT/CTA without acute abnormalities. Orthostatic vitals in hospital were normal. Echocardiogram reassuringly ok for parts visualized. MRI was offered and declined - this has been scheduled for next Wednesday. rec outpatient neurology f/u. Leukocytosis attributed to prednisone.   Ongoing lack of energy (chronic) - worse this week.  Home BP readings have been well controlled. No orthostatic dizziness.  He felt outpatient PT was very helpful.  Leg swelling has largely resolved.  Only on prednisone PRN. Continues MTX for RA.   Hematuria incidentally found during hospitalization - pt denies gross blood.   Lab Results  Component Value Date   HGBA1C 7.4 (H) 02/23/2019     Admission date: 04/24/2019 Discharge date: 04/25/2019 TCM hosp f/u phone call completed 04/26/2019  Discharge diagnosis:  Syncope and collapse.      Relevant past medical, surgical, family and social history reviewed and updated as indicated. Interim medical history since our last visit reviewed. Allergies and medications reviewed and updated. Outpatient Medications Prior to Visit  Medication Sig Dispense Refill  . acetaminophen (TYLENOL) 500 MG tablet Take 1 tablet (500 mg  total) by mouth every 6 (six) hours as needed for moderate pain.    Marland Kitchen aspirin 81 MG tablet Take 81 mg by mouth daily.      Marland Kitchen atenolol (TENORMIN) 50 MG tablet TAKE 1 TABLET (50 MG TOTAL) BY MOUTH DAILY. 90 tablet 3  . atorvastatin (LIPITOR) 80 MG tablet TAKE 1 TABLET (80 MG TOTAL) BY MOUTH AT BEDTIME. 90 tablet 3  . chlorthalidone (HYGROTON) 25 MG tablet TAKE 1 TABLET (25 MG TOTAL) BY MOUTH DAILY. 90 tablet 3  . cholecalciferol (VITAMIN D) 1000 UNITS tablet Take 1,000 Units by mouth daily.      . enalapril (VASOTEC) 10 MG tablet TAKE 1 TABLET BY MOUTH TWICE A DAY 517 tablet 1  . folic acid (FOLVITE) 1 MG tablet Take 1 mg by mouth daily.    Marland Kitchen glimepiride (AMARYL) 1 MG tablet TAKE 1 TABLET (1 MG TOTAL) BY MOUTH DAILY WITH BREAKFAST. 90 tablet 1  . KLOR-CON M10 10 MEQ tablet TAKE 1 TABLET BY MOUTH EVERY DAY 90 tablet 1  . methotrexate (RHEUMATREX) 2.5 MG tablet Take 20 mg by mouth once a week. Caution:Chemotherapy. Protect from light.  >  On Thursdays>  8 tablets    . predniSONE (DELTASONE) 5 MG tablet Take 5 mg by mouth as needed.     No facility-administered medications prior to visit.      Per HPI unless specifically indicated in ROS section below Review of Systems Objective:    BP 122/74 (BP Location: Left Arm, Patient  Position: Sitting, Cuff Size: Normal)   Pulse 73   Temp 97.6 F (36.4 C) (Temporal)   Ht 5' 8.5" (1.74 m)   Wt 210 lb 6 oz (95.4 kg)   SpO2 99%   BMI 31.52 kg/m   Wt Readings from Last 3 Encounters:  04/28/19 210 lb 6 oz (95.4 kg)  04/25/19 209 lb 6.4 oz (95 kg)  04/04/19 209 lb (94.8 kg)    Physical Exam Vitals signs and nursing note reviewed.  Constitutional:      General: He is not in acute distress.    Appearance: Normal appearance. He is not ill-appearing.  HENT:     Mouth/Throat:     Mouth: Mucous membranes are moist.     Pharynx: No posterior oropharyngeal erythema.  Eyes:     Pupils: Pupils are equal, round, and reactive to light.  Neck:      Vascular: No carotid bruit.  Cardiovascular:     Rate and Rhythm: Normal rate and regular rhythm.     Pulses: Normal pulses.     Heart sounds: Normal heart sounds. No murmur.  Pulmonary:     Effort: Pulmonary effort is normal. No respiratory distress.     Breath sounds: Normal breath sounds. No wheezing, rhonchi or rales.  Musculoskeletal:     Right lower leg: Edema (tr) present.     Left lower leg: Edema (tr) present.  Neurological:     Mental Status: He is alert.  Psychiatric:        Mood and Affect: Mood normal.        Behavior: Behavior normal.       Results for orders placed or performed in visit on 04/28/19  POCT Urinalysis Dipstick (Automated)  Result Value Ref Range   Color, UA yellow    Clarity, UA clear    Glucose, UA Negative Negative   Bilirubin, UA negative    Ketones, UA negative    Spec Grav, UA 1.025 1.010 - 1.025   Blood, UA 3+    pH, UA 6.0 5.0 - 8.0   Protein, UA Negative Negative   Urobilinogen, UA 1.0 0.2 or 1.0 E.U./dL   Nitrite, UA negative    Leukocytes, UA Negative Negative  Micro: WBC rare RBC 5-10 Bact rare Casts none Epi rare UCx not sent  CT ANGIOGRAPHY HEAD AND NECK IMPRESSION: No acute head CT finding. Age related atrophy and mild chronic small-vessel ischemic change of the white matter. Atherosclerotic change of the brachiocephalic vessels. No carotid bifurcation region stenosis. Atherosclerotic calcification in the carotid siphon regions, proximal basilar artery and vertebral artery V4 segments but without flow limiting stenosis. No large or medium vessel intracranial occlusion Electronically Signed   By: Nelson Chimes M.D.   On: 04/24/2019 14:51 Assessment & Plan:   Problem List Items Addressed This Visit    Syncope and collapse - Primary    Presumed vasovagal. Overall reassuring hospital evaluation which was reviewed with patient. Will await MRI planned next Wednesday. If normal, will watch for now. Pt agrees with plan.        HTN (hypertension)    BP stable today. Will not make changes to antihypertensive regimen.       Hematuria    Incidentally noted during recent hospitalization. Will repeat UA today - persists so will refer to urology for further evaluation.       Relevant Orders   POCT Urinalysis Dipstick (Automated) (Completed)   Elevated PSA    Mildly elevated over the past  year. Presumed BPH related. Has not had biopsy done.       Controlled type 2 diabetes mellitus without complication, without long-term current use of insulin (HCC)    Continue amaryl 1mg  daily. Denies hypoglycemia. Only on PRN prednisone.           No orders of the defined types were placed in this encounter.  Orders Placed This Encounter  Procedures  . POCT Urinalysis Dipstick (Automated)    Follow up plan: No follow-ups on file.  Ria Bush, MD

## 2019-04-28 NOTE — Assessment & Plan Note (Addendum)
Mildly elevated over the past year. Presumed BPH related. Has not had biopsy done.

## 2019-04-28 NOTE — Assessment & Plan Note (Signed)
BP stable today. Will not make changes to antihypertensive regimen.

## 2019-04-28 NOTE — Patient Instructions (Addendum)
Orthostatic vital signs today.  Urinalysis today. We will be in touch with results. I'm glad you're feeling better.  Return for previously scheduled October appointment

## 2019-05-01 ENCOUNTER — Encounter: Payer: PPO | Admitting: Occupational Therapy

## 2019-05-01 ENCOUNTER — Ambulatory Visit: Payer: PPO

## 2019-05-03 ENCOUNTER — Ambulatory Visit: Payer: PPO

## 2019-05-03 ENCOUNTER — Encounter: Payer: PPO | Admitting: Occupational Therapy

## 2019-05-03 DIAGNOSIS — G4752 REM sleep behavior disorder: Secondary | ICD-10-CM | POA: Diagnosis not present

## 2019-05-03 DIAGNOSIS — R55 Syncope and collapse: Secondary | ICD-10-CM | POA: Diagnosis not present

## 2019-05-03 DIAGNOSIS — R2689 Other abnormalities of gait and mobility: Secondary | ICD-10-CM | POA: Diagnosis not present

## 2019-05-03 DIAGNOSIS — R259 Unspecified abnormal involuntary movements: Secondary | ICD-10-CM | POA: Diagnosis not present

## 2019-05-03 DIAGNOSIS — R569 Unspecified convulsions: Secondary | ICD-10-CM | POA: Diagnosis not present

## 2019-05-04 ENCOUNTER — Other Ambulatory Visit: Payer: Self-pay | Admitting: Neurology

## 2019-05-04 DIAGNOSIS — R569 Unspecified convulsions: Secondary | ICD-10-CM

## 2019-05-05 DIAGNOSIS — M059 Rheumatoid arthritis with rheumatoid factor, unspecified: Secondary | ICD-10-CM | POA: Diagnosis not present

## 2019-05-05 DIAGNOSIS — R768 Other specified abnormal immunological findings in serum: Secondary | ICD-10-CM | POA: Diagnosis not present

## 2019-05-05 DIAGNOSIS — Z79899 Other long term (current) drug therapy: Secondary | ICD-10-CM | POA: Diagnosis not present

## 2019-05-08 ENCOUNTER — Encounter: Payer: PPO | Admitting: Occupational Therapy

## 2019-05-08 ENCOUNTER — Ambulatory Visit: Payer: PPO

## 2019-05-10 ENCOUNTER — Ambulatory Visit: Payer: PPO

## 2019-05-11 ENCOUNTER — Ambulatory Visit: Payer: PPO

## 2019-05-11 ENCOUNTER — Encounter: Payer: PPO | Admitting: Occupational Therapy

## 2019-05-14 ENCOUNTER — Other Ambulatory Visit: Payer: Self-pay | Admitting: Family Medicine

## 2019-05-15 ENCOUNTER — Ambulatory Visit
Admission: RE | Admit: 2019-05-15 | Discharge: 2019-05-15 | Disposition: A | Discharge status: Home / Self Care | Payer: PPO | Source: Ambulatory Visit | Attending: Neurology | Admitting: Neurology

## 2019-05-15 ENCOUNTER — Other Ambulatory Visit: Payer: Self-pay

## 2019-05-15 ENCOUNTER — Ambulatory Visit: Payer: PPO | Admitting: Physical Therapy

## 2019-05-15 ENCOUNTER — Other Ambulatory Visit: Payer: PPO

## 2019-05-15 ENCOUNTER — Encounter: Payer: PPO | Admitting: Occupational Therapy

## 2019-05-15 DIAGNOSIS — R569 Unspecified convulsions: Secondary | ICD-10-CM | POA: Insufficient documentation

## 2019-05-15 DIAGNOSIS — R55 Syncope and collapse: Secondary | ICD-10-CM | POA: Diagnosis not present

## 2019-05-15 DIAGNOSIS — R42 Dizziness and giddiness: Secondary | ICD-10-CM | POA: Diagnosis not present

## 2019-05-15 MED ORDER — GADOBUTROL 1 MMOL/ML IV SOLN
10.0000 mL | Freq: Once | INTRAVENOUS | Status: AC | PRN
  Administered 2019-05-15: 10 mL via INTRAVENOUS

## 2019-05-16 NOTE — Telephone Encounter (Signed)
Per refill note on 04/19/2019 patient is taking Prednisone 5 mg daily as needed per rheumatology. Does this need to go to rheumatology?

## 2019-05-16 NOTE — Telephone Encounter (Signed)
request should go to rheumatology

## 2019-05-18 ENCOUNTER — Ambulatory Visit: Payer: PPO | Admitting: Physical Therapy

## 2019-05-18 ENCOUNTER — Encounter: Payer: PPO | Admitting: Occupational Therapy

## 2019-05-24 DIAGNOSIS — R3121 Asymptomatic microscopic hematuria: Secondary | ICD-10-CM | POA: Diagnosis not present

## 2019-05-24 DIAGNOSIS — N3943 Post-void dribbling: Secondary | ICD-10-CM | POA: Diagnosis not present

## 2019-05-24 DIAGNOSIS — N401 Enlarged prostate with lower urinary tract symptoms: Secondary | ICD-10-CM | POA: Diagnosis not present

## 2019-05-24 DIAGNOSIS — R3912 Poor urinary stream: Secondary | ICD-10-CM | POA: Diagnosis not present

## 2019-05-24 DIAGNOSIS — R351 Nocturia: Secondary | ICD-10-CM | POA: Diagnosis not present

## 2019-05-24 DIAGNOSIS — R972 Elevated prostate specific antigen [PSA]: Secondary | ICD-10-CM | POA: Diagnosis not present

## 2019-05-31 DIAGNOSIS — R3121 Asymptomatic microscopic hematuria: Secondary | ICD-10-CM | POA: Diagnosis not present

## 2019-05-31 DIAGNOSIS — K5730 Diverticulosis of large intestine without perforation or abscess without bleeding: Secondary | ICD-10-CM | POA: Diagnosis not present

## 2019-05-31 DIAGNOSIS — I2510 Atherosclerotic heart disease of native coronary artery without angina pectoris: Secondary | ICD-10-CM | POA: Diagnosis not present

## 2019-05-31 DIAGNOSIS — R3129 Other microscopic hematuria: Secondary | ICD-10-CM | POA: Diagnosis not present

## 2019-05-31 DIAGNOSIS — I700 Atherosclerosis of aorta: Secondary | ICD-10-CM | POA: Diagnosis not present

## 2019-05-31 DIAGNOSIS — N3289 Other specified disorders of bladder: Secondary | ICD-10-CM | POA: Diagnosis not present

## 2019-05-31 DIAGNOSIS — N281 Cyst of kidney, acquired: Secondary | ICD-10-CM | POA: Diagnosis not present

## 2019-05-31 DIAGNOSIS — K861 Other chronic pancreatitis: Secondary | ICD-10-CM | POA: Diagnosis not present

## 2019-05-31 DIAGNOSIS — M5137 Other intervertebral disc degeneration, lumbosacral region: Secondary | ICD-10-CM | POA: Diagnosis not present

## 2019-05-31 DIAGNOSIS — N400 Enlarged prostate without lower urinary tract symptoms: Secondary | ICD-10-CM | POA: Diagnosis not present

## 2019-05-31 DIAGNOSIS — I517 Cardiomegaly: Secondary | ICD-10-CM | POA: Diagnosis not present

## 2019-06-05 DIAGNOSIS — D414 Neoplasm of uncertain behavior of bladder: Secondary | ICD-10-CM | POA: Diagnosis not present

## 2019-06-05 DIAGNOSIS — R310 Gross hematuria: Secondary | ICD-10-CM | POA: Diagnosis not present

## 2019-06-05 DIAGNOSIS — R3121 Asymptomatic microscopic hematuria: Secondary | ICD-10-CM | POA: Diagnosis not present

## 2019-06-06 DIAGNOSIS — Z79899 Other long term (current) drug therapy: Secondary | ICD-10-CM | POA: Diagnosis not present

## 2019-06-06 DIAGNOSIS — R768 Other specified abnormal immunological findings in serum: Secondary | ICD-10-CM | POA: Diagnosis not present

## 2019-06-06 DIAGNOSIS — R600 Localized edema: Secondary | ICD-10-CM | POA: Diagnosis not present

## 2019-06-06 DIAGNOSIS — M059 Rheumatoid arthritis with rheumatoid factor, unspecified: Secondary | ICD-10-CM | POA: Diagnosis not present

## 2019-06-07 ENCOUNTER — Other Ambulatory Visit: Payer: Self-pay | Admitting: Urology

## 2019-06-07 NOTE — Patient Instructions (Addendum)
DUE TO COVID-19 ONLY ONE VISITOR IS ALLOWED TO COME WITH YOU AND STAY IN THE WAITING ROOM ONLY DURING PRE OP AND PROCEDURE DAY OF SURGERY. THE 1 VISITOR MAY VISIT WITH YOU AFTER SURGERY IN YOUR PRIVATE ROOM DURING VISITING HOURS ONLY!  YOU NEED TO HAVE A COVID 19 TEST ON____9-17-2020___ @__1040  am_____, THIS TEST MUST BE DONE BEFORE SURGERY, COME  [name] , [address].  ([place])  ONCE YOUR COVID TEST IS COMPLETED, PLEASE BEGIN THE QUARANTINE INSTRUCTIONS AS OUTLINED IN YOUR HANDOUT.                Blake Peterson   Your procedure is scheduled on: 06-12-2019 Monday   Report to [facility] Main  Entrance              Report to admitting at 9:45AM     Call this number if you have problems the morning of surgery [phone]    Remember: Do not eat food or drink liquids :After Midnight.               BRUSH YOUR TEETH MORNING OF SURGERY AND RINSE YOUR MOUTH OUT, NO CHEWING GUM CANDY OR MINTS.     Take these medicines the morning of surgery with A SIP OF WATER: ATENOLOL  How to Manage Your Diabetes Before and After Surgery  Why is it important to control my blood sugar before and after surgery? . Improving blood sugar levels before and after surgery helps healing and can limit problems. . A way of improving blood sugar control is eating a healthy diet by: o  Eating less sugar and carbohydrates o  Increasing activity/exercise o  Talking with your doctor about reaching your blood sugar goals . High blood sugars (greater than 180 mg/dL) can raise your risk of infections and slow your recovery, so you will need to focus on controlling your diabetes during the weeks before surgery. . Make sure that the doctor who takes care of your diabetes knows about your planned surgery including the date and location.  How do I manage my blood sugar before surgery? . Check your blood sugar at least 4 times a day, starting 2 days before surgery, to make  sure that the level is not too high or low. o Check your blood sugar the morning of your surgery when you wake up and every 2 hours until you get to the Short Stay unit. . If your blood sugar is less than 70 mg/dL, you will need to treat for low blood sugar: o Do not take insulin. o Treat a low blood sugar (less than 70 mg/dL) with  cup of clear juice (cranberry or apple), 4 glucose tablets, OR glucose gel. o Recheck blood sugar in 15 minutes after treatment (to make sure it is greater than 70 mg/dL). If your blood sugar is not greater than 70 mg/dL on recheck, call [phone] for further instructions. . Report your blood sugar to the short stay nurse when you get to Short Stay.  . If you are admitted to the hospital after surgery: o Your blood sugar will be checked by the staff and you will probably be given insulin after surgery (instead of oral diabetes medicines) to make sure you have good blood sugar levels. o The goal for blood sugar control after surgery is 80-180 mg/dL.   WHAT DO I DO ABOUT MY DIABETES MEDICATION?    . THE DAY BEFORE SURGERY, TAKE GLIMEPIRIDE AS NORMAL        .  THE MORNING OF SURGERY, DO NOT TAKE ANY DIABETES MEDICATION                                 You may not have any metal on your body including hair pins and              piercings  Do not wear jewelry, make-up, lotions, powders or perfumes, deodorant                    Men may shave face and neck.   Do not bring valuables to the hospital. [place].  Contacts, dentures or bridgework may not be worn into surgery.      Patients discharged the day of surgery will not be allowed to drive home. IF YOU ARE HAVING SURGERY AND GOING HOME THE SAME DAY, YOU MUST HAVE AN ADULT TO DRIVE YOU HOME AND BE WITH YOU FOR 24 HOURS. YOU MAY GO HOME BY TAXI OR UBER OR ORTHERWISE, BUT AN ADULT MUST ACCOMPANY YOU HOME AND STAY WITH YOU FOR 24 HOURS.  Name and phone number  of your driver: spouse- Blake Peterson  [phone]               Please read over the following fact sheets you were given: _____________________________________________________________________             [facility] - Preparing for Surgery Before surgery, you can play an important role.  Because skin is not sterile, your skin needs to be as free of germs as possible.  You can reduce the number of germs on your skin by washing with CHG (chlorahexidine gluconate) soap before surgery.  CHG is an antiseptic cleaner which kills germs and bonds with the skin to continue killing germs even after washing. Please DO NOT use if you have an allergy to CHG or antibacterial soaps.  If your skin becomes reddened/irritated stop using the CHG and inform your nurse when you arrive at Short Stay. Do not shave (including legs and underarms) for at least 48 hours prior to the first CHG shower.  You may shave your face/neck. Please follow these instructions carefully:  1.  Shower with CHG Soap the night before surgery and the  morning of Surgery.  2.  If you choose to wash your hair, wash your hair first as usual with your  normal  shampoo.  3.  After you shampoo, rinse your hair and body thoroughly to remove the  shampoo.                           4.  Use CHG as you would any other liquid soap.  You can apply chg directly  to the skin and wash                       Gently with a scrungie or clean washcloth.  5.  Apply the CHG Soap to your body ONLY FROM THE NECK DOWN.   Do not use on face/ open                           Wound or open sores. Avoid contact with eyes, ears mouth and genitals (private parts).  Wash face,  Genitals (private parts) with your normal soap.             6.  Wash thoroughly, paying special attention to the area where your surgery  will be performed.  7.  Thoroughly rinse your body with warm water from the neck down.  8.  DO NOT shower/wash with your normal soap after  using and rinsing off  the CHG Soap.                9.  Pat yourself dry with a clean towel.            10.  Wear clean pajamas.            11.  Place clean sheets on your bed the night of your first shower and do not  sleep with pets. Day of Surgery : Do not apply any lotions/deodorants the morning of surgery.  Please wear clean clothes to the hospital/surgery center.  FAILURE TO FOLLOW THESE INSTRUCTIONS MAY RESULT IN THE CANCELLATION OF YOUR SURGERY PATIENT SIGNATURE_________________________________  NURSE SIGNATURE__________________________________  ________________________________________________________________________

## 2019-06-08 ENCOUNTER — Encounter: Payer: Self-pay | Admitting: Family Medicine

## 2019-06-08 ENCOUNTER — Encounter (HOSPITAL_COMMUNITY): Payer: Self-pay

## 2019-06-08 ENCOUNTER — Other Ambulatory Visit (HOSPITAL_COMMUNITY)
Admission: RE | Admit: 2019-06-08 | Discharge: 2019-06-08 | Disposition: A | Discharge status: Home / Self Care | Payer: PPO | Source: Ambulatory Visit | Attending: Urology | Admitting: Urology

## 2019-06-08 ENCOUNTER — Encounter (HOSPITAL_COMMUNITY)
Admission: RE | Admit: 2019-06-08 | Discharge: 2019-06-08 | Disposition: A | Discharge status: Home / Self Care | Payer: PPO | Source: Ambulatory Visit | Attending: Urology | Admitting: Urology

## 2019-06-08 ENCOUNTER — Other Ambulatory Visit: Payer: Self-pay

## 2019-06-08 DIAGNOSIS — Z01818 Encounter for other preprocedural examination: Secondary | ICD-10-CM | POA: Diagnosis not present

## 2019-06-08 DIAGNOSIS — C679 Malignant neoplasm of bladder, unspecified: Secondary | ICD-10-CM | POA: Insufficient documentation

## 2019-06-08 DIAGNOSIS — I10 Essential (primary) hypertension: Secondary | ICD-10-CM | POA: Diagnosis not present

## 2019-06-08 DIAGNOSIS — Z20828 Contact with and (suspected) exposure to other viral communicable diseases: Secondary | ICD-10-CM | POA: Diagnosis not present

## 2019-06-08 DIAGNOSIS — D494 Neoplasm of unspecified behavior of bladder: Secondary | ICD-10-CM | POA: Diagnosis not present

## 2019-06-08 HISTORY — DX: Prediabetes: R73.03

## 2019-06-08 LAB — CBC
HCT: 47 % (ref 39.0–52.0)
Hemoglobin: 15 g/dL (ref 13.0–17.0)
MCH: 30.8 pg (ref 26.0–34.0)
MCHC: 31.9 g/dL (ref 30.0–36.0)
MCV: 96.5 fL (ref 80.0–100.0)
Platelets: 301 10*3/uL (ref 150–400)
RBC: 4.87 MIL/uL (ref 4.22–5.81)
RDW: 14.3 % (ref 11.5–15.5)
WBC: 13.1 10*3/uL — ABNORMAL HIGH (ref 4.0–10.5)
nRBC: 0 % (ref 0.0–0.2)

## 2019-06-08 LAB — BASIC METABOLIC PANEL
Anion gap: 8 (ref 5–15)
BUN: 19 mg/dL (ref 8–23)
CO2: 28 mmol/L (ref 22–32)
Calcium: 9.8 mg/dL (ref 8.9–10.3)
Chloride: 99 mmol/L (ref 98–111)
Creatinine, Ser: 0.98 mg/dL (ref 0.61–1.24)
GFR calc Af Amer: >60 mL/min (ref >60)
GFR calc non Af Amer: >60 mL/min (ref >60)
Glucose, Bld: 152 mg/dL — ABNORMAL HIGH (ref 70–99)
Potassium: 4 mmol/L (ref 3.5–5.1)
Sodium: 135 mmol/L (ref 135–145)

## 2019-06-08 LAB — HEMOGLOBIN A1C
Hgb A1c MFr Bld: 6.4 % — ABNORMAL HIGH (ref 4.8–5.6)
Mean Plasma Glucose: 136.98 mg/dL

## 2019-06-08 LAB — GLUCOSE, CAPILLARY: Glucose-Capillary: 189 mg/dL — ABNORMAL HIGH (ref 70–99)

## 2019-06-08 NOTE — Progress Notes (Signed)
PCP - Dr. Fredrich Birks [facility] Cardiologist - N/A  Rheumatologist- Dr. Uvaldo Bristle at [facility], [place].   Patient given instructions to call Dr. Meda Coffee to ask when does he need to stop taking his Methotrexate. He is due a dose today and has not taken the Methotrexate yet  Chest x-ray - 04/24/2019 EKG - 06/09/2019 Stress Test - N/A ECHO - 04/25/2019 Cardiac Cath - ? Patient thinks if he had one it was many years ago  Sleep Study -N/A  CPAP -   Fasting Blood Sugar -189  Checks Blood Sugar __1___ times a day  Blood Thinner Instructions: Aspirin Instructions:Instructions for stopping Aspirin given to patient by Dr. Danise Mina Last Dose:Tuesday 06/06/2019  Anesthesia review:  Chart given to Iver Nestle, PA for review Hx HTN, Diabetes, Rheumatoid Arthritis  Patient denies shortness of breath, fever, cough and chest pain at PAT appointment   Patient verbalized understanding of instructions that were given to them at the PAT appointment. Patient was also instructed that they will need to review over the PAT instructions again at home before surgery.

## 2019-06-08 NOTE — Progress Notes (Signed)
   06/08/19 0936  OBSTRUCTIVE SLEEP APNEA  Have you ever been diagnosed with sleep apnea through a sleep study? No  Do you snore loudly (loud enough to be heard through closed doors)?  1  Do you often feel tired, fatigued, or sleepy during the daytime (such as falling asleep during driving or talking to someone)? 1  Has anyone observed you stop breathing during your sleep? 0  Do you have, or are you being treated for high blood pressure? 1  BMI more than 35 kg/m2? 0  Age > 50 (1-yes) 1  Neck circumference greater than:Male 16 inches or larger, Male 17inches or larger? 1  Male Gender (Yes=1) 1  Obstructive Sleep Apnea Score 6  Score 5 or greater  Results sent to PCP

## 2019-06-09 LAB — NOVEL CORONAVIRUS, NAA (HOSP ORDER, SEND-OUT TO REF LAB; TAT 18-24 HRS): SARS-CoV-2, NAA: NOT DETECTED

## 2019-06-12 ENCOUNTER — Ambulatory Visit (HOSPITAL_COMMUNITY)
Admission: RE | Admit: 2019-06-12 | Discharge: 2019-06-12 | Disposition: A | Discharge status: Home / Self Care | Payer: PPO | Source: Ambulatory Visit | Attending: Urology | Admitting: Urology

## 2019-06-12 ENCOUNTER — Ambulatory Visit (HOSPITAL_COMMUNITY): Payer: PPO | Admitting: Certified Registered"

## 2019-06-12 ENCOUNTER — Encounter (HOSPITAL_COMMUNITY): Payer: Self-pay

## 2019-06-12 ENCOUNTER — Other Ambulatory Visit: Payer: Self-pay

## 2019-06-12 ENCOUNTER — Ambulatory Visit (HOSPITAL_COMMUNITY): Payer: PPO | Admitting: Physician Assistant

## 2019-06-12 ENCOUNTER — Encounter (HOSPITAL_COMMUNITY)
Admission: RE | Disposition: A | Discharge status: Home / Self Care | Payer: Self-pay | Source: Ambulatory Visit | Attending: Urology

## 2019-06-12 DIAGNOSIS — R3129 Other microscopic hematuria: Secondary | ICD-10-CM | POA: Diagnosis not present

## 2019-06-12 DIAGNOSIS — C674 Malignant neoplasm of posterior wall of bladder: Secondary | ICD-10-CM | POA: Insufficient documentation

## 2019-06-12 DIAGNOSIS — Z8042 Family history of malignant neoplasm of prostate: Secondary | ICD-10-CM | POA: Insufficient documentation

## 2019-06-12 DIAGNOSIS — E7800 Pure hypercholesterolemia, unspecified: Secondary | ICD-10-CM | POA: Diagnosis not present

## 2019-06-12 DIAGNOSIS — E119 Type 2 diabetes mellitus without complications: Secondary | ICD-10-CM | POA: Insufficient documentation

## 2019-06-12 DIAGNOSIS — E785 Hyperlipidemia, unspecified: Secondary | ICD-10-CM | POA: Diagnosis not present

## 2019-06-12 DIAGNOSIS — N401 Enlarged prostate with lower urinary tract symptoms: Secondary | ICD-10-CM | POA: Insufficient documentation

## 2019-06-12 DIAGNOSIS — I10 Essential (primary) hypertension: Secondary | ICD-10-CM | POA: Insufficient documentation

## 2019-06-12 DIAGNOSIS — D494 Neoplasm of unspecified behavior of bladder: Secondary | ICD-10-CM | POA: Diagnosis not present

## 2019-06-12 DIAGNOSIS — Z87891 Personal history of nicotine dependence: Secondary | ICD-10-CM | POA: Diagnosis not present

## 2019-06-12 DIAGNOSIS — C679 Malignant neoplasm of bladder, unspecified: Secondary | ICD-10-CM | POA: Diagnosis not present

## 2019-06-12 DIAGNOSIS — Z7982 Long term (current) use of aspirin: Secondary | ICD-10-CM | POA: Insufficient documentation

## 2019-06-12 DIAGNOSIS — R351 Nocturia: Secondary | ICD-10-CM | POA: Insufficient documentation

## 2019-06-12 DIAGNOSIS — Z8249 Family history of ischemic heart disease and other diseases of the circulatory system: Secondary | ICD-10-CM | POA: Diagnosis not present

## 2019-06-12 DIAGNOSIS — I4510 Unspecified right bundle-branch block: Secondary | ICD-10-CM | POA: Insufficient documentation

## 2019-06-12 DIAGNOSIS — I440 Atrioventricular block, first degree: Secondary | ICD-10-CM | POA: Diagnosis not present

## 2019-06-12 DIAGNOSIS — R3912 Poor urinary stream: Secondary | ICD-10-CM | POA: Insufficient documentation

## 2019-06-12 DIAGNOSIS — R972 Elevated prostate specific antigen [PSA]: Secondary | ICD-10-CM | POA: Insufficient documentation

## 2019-06-12 DIAGNOSIS — Z7984 Long term (current) use of oral hypoglycemic drugs: Secondary | ICD-10-CM | POA: Diagnosis not present

## 2019-06-12 DIAGNOSIS — Z79899 Other long term (current) drug therapy: Secondary | ICD-10-CM | POA: Insufficient documentation

## 2019-06-12 DIAGNOSIS — E669 Obesity, unspecified: Secondary | ICD-10-CM | POA: Diagnosis not present

## 2019-06-12 HISTORY — PX: TRANSURETHRAL RESECTION OF BLADDER TUMOR: SHX2575

## 2019-06-12 LAB — GLUCOSE, CAPILLARY: Glucose-Capillary: 119 mg/dL — ABNORMAL HIGH (ref 70–99)

## 2019-06-12 SURGERY — TURBT (TRANSURETHRAL RESECTION OF BLADDER TUMOR)
Anesthesia: General

## 2019-06-12 MED ORDER — HYDROCODONE-ACETAMINOPHEN 5-325 MG PO TABS: 1.0000 | ORAL_TABLET | ORAL | 0 refills | Status: DC | PRN

## 2019-06-12 MED ORDER — LIDOCAINE 2% (20 MG/ML) 5 ML SYRINGE
INTRAMUSCULAR | Status: AC
  Filled 2019-06-12: qty 5

## 2019-06-12 MED ORDER — SUCCINYLCHOLINE CHLORIDE 200 MG/10ML IV SOSY
PREFILLED_SYRINGE | INTRAVENOUS | Status: DC | PRN
  Administered 2019-06-12: 100 mg via INTRAVENOUS

## 2019-06-12 MED ORDER — FENTANYL CITRATE (PF) 100 MCG/2ML IJ SOLN
INTRAMUSCULAR | Status: AC
  Filled 2019-06-12: qty 2

## 2019-06-12 MED ORDER — GEMCITABINE CHEMO FOR BLADDER INSTILLATION 2000 MG: 2000.0000 mg | Freq: Once | INTRAVENOUS | Status: DC

## 2019-06-12 MED ORDER — SUGAMMADEX SODIUM 200 MG/2ML IV SOLN
INTRAVENOUS | Status: DC | PRN
  Administered 2019-06-12: 200 mg via INTRAVENOUS

## 2019-06-12 MED ORDER — SODIUM CHLORIDE 0.9 % IR SOLN
Status: DC | PRN
  Administered 2019-06-12: 6000 mL

## 2019-06-12 MED ORDER — DEXAMETHASONE SODIUM PHOSPHATE 10 MG/ML IJ SOLN
INTRAMUSCULAR | Status: AC
  Filled 2019-06-12: qty 1

## 2019-06-12 MED ORDER — CEFAZOLIN SODIUM-DEXTROSE 2-4 GM/100ML-% IV SOLN
2.0000 g | INTRAVENOUS | Status: AC
  Administered 2019-06-12: 13:00:00 2 g via INTRAVENOUS
  Filled 2019-06-12: qty 100

## 2019-06-12 MED ORDER — ONDANSETRON HCL 4 MG/2ML IJ SOLN
INTRAMUSCULAR | Status: DC | PRN
  Administered 2019-06-12: 4 mg via INTRAVENOUS

## 2019-06-12 MED ORDER — ONDANSETRON HCL 4 MG/2ML IJ SOLN: 4.0000 mg | Freq: Once | INTRAMUSCULAR | Status: DC | PRN

## 2019-06-12 MED ORDER — EPHEDRINE SULFATE-NACL 50-0.9 MG/10ML-% IV SOSY
PREFILLED_SYRINGE | INTRAVENOUS | Status: DC | PRN
  Administered 2019-06-12 (×3): 10 mg via INTRAVENOUS

## 2019-06-12 MED ORDER — FENTANYL CITRATE (PF) 100 MCG/2ML IJ SOLN
25.0000 ug | INTRAMUSCULAR | Status: DC | PRN
  Administered 2019-06-12 (×2): 25 ug via INTRAVENOUS

## 2019-06-12 MED ORDER — SUCCINYLCHOLINE CHLORIDE 200 MG/10ML IV SOSY
PREFILLED_SYRINGE | INTRAVENOUS | Status: AC
  Filled 2019-06-12: qty 10

## 2019-06-12 MED ORDER — PROPOFOL 10 MG/ML IV BOLUS
INTRAVENOUS | Status: AC
  Filled 2019-06-12: qty 20

## 2019-06-12 MED ORDER — GEMCITABINE CHEMO FOR BLADDER INSTILLATION 2000 MG
2000.0000 mg | Freq: Once | INTRAVENOUS | Status: AC
  Administered 2019-06-12: 2000 mg via INTRAVESICAL
  Filled 2019-06-12: qty 2000

## 2019-06-12 MED ORDER — ONDANSETRON HCL 4 MG/2ML IJ SOLN
INTRAMUSCULAR | Status: AC
  Filled 2019-06-12: qty 2

## 2019-06-12 MED ORDER — LACTATED RINGERS IV SOLN
INTRAVENOUS | Status: DC
  Administered 2019-06-12: 11:00:00 via INTRAVENOUS

## 2019-06-12 MED ORDER — PHENYLEPHRINE 40 MCG/ML (10ML) SYRINGE FOR IV PUSH (FOR BLOOD PRESSURE SUPPORT)
PREFILLED_SYRINGE | INTRAVENOUS | Status: DC | PRN
  Administered 2019-06-12: 80 ug via INTRAVENOUS

## 2019-06-12 MED ORDER — PHENYLEPHRINE 40 MCG/ML (10ML) SYRINGE FOR IV PUSH (FOR BLOOD PRESSURE SUPPORT)
PREFILLED_SYRINGE | INTRAVENOUS | Status: AC
  Filled 2019-06-12: qty 10

## 2019-06-12 MED ORDER — DEXAMETHASONE SODIUM PHOSPHATE 10 MG/ML IJ SOLN
INTRAMUSCULAR | Status: DC | PRN
  Administered 2019-06-12: 4 mg via INTRAVENOUS

## 2019-06-12 MED ORDER — EPHEDRINE 5 MG/ML INJ
INTRAVENOUS | Status: AC
  Filled 2019-06-12: qty 10

## 2019-06-12 MED ORDER — FENTANYL CITRATE (PF) 250 MCG/5ML IJ SOLN
INTRAMUSCULAR | Status: DC | PRN
  Administered 2019-06-12: 50 ug via INTRAVENOUS

## 2019-06-12 MED ORDER — ACETAMINOPHEN 500 MG PO TABS
1000.0000 mg | ORAL_TABLET | Freq: Once | ORAL | Status: AC
  Administered 2019-06-12: 1000 mg via ORAL
  Filled 2019-06-12: qty 2

## 2019-06-12 MED ORDER — ROCURONIUM BROMIDE 10 MG/ML (PF) SYRINGE
PREFILLED_SYRINGE | INTRAVENOUS | Status: DC | PRN
  Administered 2019-06-12: 40 mg via INTRAVENOUS

## 2019-06-12 MED ORDER — LIDOCAINE 2% (20 MG/ML) 5 ML SYRINGE
INTRAMUSCULAR | Status: DC | PRN
  Administered 2019-06-12: 80 mg via INTRAVENOUS

## 2019-06-12 MED ORDER — ROCURONIUM BROMIDE 10 MG/ML (PF) SYRINGE
PREFILLED_SYRINGE | INTRAVENOUS | Status: AC
  Filled 2019-06-12: qty 10

## 2019-06-12 MED ORDER — FENTANYL CITRATE (PF) 100 MCG/2ML IJ SOLN
INTRAMUSCULAR | Status: AC
  Administered 2019-06-12: 25 ug via INTRAVENOUS
  Filled 2019-06-12: qty 2

## 2019-06-12 MED ORDER — PROPOFOL 10 MG/ML IV BOLUS
INTRAVENOUS | Status: DC | PRN
  Administered 2019-06-12: 150 mg via INTRAVENOUS

## 2019-06-12 SURGICAL SUPPLY — 17 items
BAG URINE DRAINAGE (UROLOGICAL SUPPLIES) ×2 IMPLANT
BAG URO CATCHER STRL LF (MISCELLANEOUS) ×3 IMPLANT
CATH FOLEY 2WAY SLVR  5CC 18FR (CATHETERS) ×2
CATH FOLEY 2WAY SLVR 5CC 18FR (CATHETERS) IMPLANT
COVER WAND RF STERILE (DRAPES) IMPLANT
ELECT REM PT RETURN 15FT ADLT (MISCELLANEOUS) ×1 IMPLANT
GLOVE BIO SURGEON STRL SZ7.5 (GLOVE) ×3 IMPLANT
GOWN STRL REUS W/TWL LRG LVL3 (GOWN DISPOSABLE) ×3 IMPLANT
KIT TURNOVER KIT A (KITS) IMPLANT
LOOP CUT BIPOLAR 24F LRG (ELECTROSURGICAL) ×2 IMPLANT
MANIFOLD NEPTUNE II (INSTRUMENTS) ×3 IMPLANT
PACK CYSTO (CUSTOM PROCEDURE TRAY) ×3 IMPLANT
PLUG CATH AND CAP STER (CATHETERS) ×2 IMPLANT
SYRINGE IRR TOOMEY STRL 70CC (SYRINGE) ×2 IMPLANT
TUBING CONNECTING 10 (TUBING) ×2 IMPLANT
TUBING CONNECTING 10' (TUBING) ×1
TUBING UROLOGY SET (TUBING) ×3 IMPLANT

## 2019-06-12 NOTE — Anesthesia Preprocedure Evaluation (Addendum)
Anesthesia Evaluation  Patient identified by MRN, date of birth, ID band Patient awake    Reviewed: Allergy & Precautions, NPO status , Patient's Chart, lab work & pertinent test results, reviewed documented beta blocker date and time   Airway Mallampati: III  TM Distance: >3 FB Neck ROM: Full    Dental no notable dental hx.    Pulmonary former smoker,    Pulmonary exam normal breath sounds clear to auscultation       Cardiovascular hypertension, Pt. on home beta blockers and Pt. on medications Normal cardiovascular exam Rhythm:Regular Rate:Normal  ECG: rate 66.Sinus rhythm with 1st degree A-V block Incomplete right bundle branch block   ECHO: 1. The left ventricle has normal systolic function with an ejection fraction of 60-65%. The cavity size was normal. Left ventricular diastolic parameters were normal. 2. The right ventricle has normal systolic function. The cavity was normal. There is no increase in right ventricular wall thickness. 3. The aortic valve was not well visualized. 4. The aorta is normal in size and structure. 5. The interatrial septum was not assessed.   Neuro/Psych negative neurological ROS  negative psych ROS   GI/Hepatic negative GI ROS, Neg liver ROS,   Endo/Other  diabetes, Oral Hypoglycemic Agents  Renal/GU negative Renal ROS     Musculoskeletal negative musculoskeletal ROS (+)   Abdominal (+) + obese,   Peds  Hematology HLD   Anesthesia Other Findings BLADDER TUMOR  Reproductive/Obstetrics                            Anesthesia Physical Anesthesia Plan  ASA: III  Anesthesia Plan: General   Post-op Pain Management:    Induction: Intravenous  PONV Risk Score and Plan: 4 or greater and Ondansetron, Dexamethasone and Treatment may vary due to age or medical condition  Airway Management Planned: Oral ETT  Additional Equipment:   Intra-op Plan:    Post-operative Plan: Extubation in OR  Informed Consent: I have reviewed the patients History and Physical, chart, labs and discussed the procedure including the risks, benefits and alternatives for the proposed anesthesia with the patient or authorized representative who has indicated his/her understanding and acceptance.     Dental advisory given  Plan Discussed with: CRNA  Anesthesia Plan Comments:        Anesthesia Quick Evaluation

## 2019-06-12 NOTE — Anesthesia Procedure Notes (Signed)
Procedure Name: Intubation Date/Time: 06/12/2019 1:01 PM Performed by: West Pugh, CRNA Pre-anesthesia Checklist: Patient identified, Emergency Drugs available, Suction available, Patient being monitored and Timeout performed Patient Re-evaluated:Patient Re-evaluated prior to induction Oxygen Delivery Method: Circle system utilized Preoxygenation: Pre-oxygenation with 100% oxygen Induction Type: IV induction, Cricoid Pressure applied and Rapid sequence Laryngoscope Size: Mac and 4 Grade View: Grade II Tube type: Oral Tube size: 7.5 mm Number of attempts: 1 Airway Equipment and Method: Stylet Placement Confirmation: ETT inserted through vocal cords under direct vision,  positive ETCO2,  CO2 detector and breath sounds checked- equal and bilateral Secured at: 21 cm Tube secured with: Tape Dental Injury: Teeth and Oropharynx as per pre-operative assessment

## 2019-06-12 NOTE — Anesthesia Postprocedure Evaluation (Signed)
Anesthesia Post Note  Patient: Blake Peterson  Procedure(s) Performed: TRANSURETHRAL RESECTION OF BLADDER TUMOR (TURBT) WITH GEMCITABINE IN PACU (N/A )     Patient location during evaluation: PACU Anesthesia Type: General Level of consciousness: awake and alert Pain management: pain level controlled Vital Signs Assessment: post-procedure vital signs reviewed and stable Respiratory status: spontaneous breathing, nonlabored ventilation, respiratory function stable and patient connected to nasal cannula oxygen Cardiovascular status: blood pressure returned to baseline and stable Postop Assessment: no apparent nausea or vomiting Anesthetic complications: no    Last Vitals:  Vitals:   06/12/19 1445 06/12/19 1546  BP: 132/80 130/80  Pulse: 70 75  Resp: 16 18  Temp: (!) 36.4 C 36.4 C  SpO2: 100% 93%    Last Pain:  Vitals:   06/12/19 1546  TempSrc: Axillary  PainSc: 0-No pain                 Tycho Cheramie P Laureano Hetzer

## 2019-06-12 NOTE — Op Note (Signed)
Operative Note  Preoperative diagnosis:  1.  Bladder tumor--medium  Postoperative diagnosis: 1.  Bladder tumor--medium  Procedure(s): 1.  Transurethral resection of bladder tumor--medium 2.  Intravesical instillation of gemcitabine  Surgeon: Link Snuffer, MD  Assistants: None  Anesthesia: General  Complications: None immediate  EBL: Minimal  Specimens: 1.  Bladder tumor  Drains/Catheters: 1.  18 French urethral catheter  Intraoperative findings: 1.  Normal anterior urethra 2.  Moderately obstructing prostate with high riding bladder neck 3.  Posterior 2.5 cm bladder tumor appeared completely resected.  No other bladder tumors.  Bilateral ureteral orifices were normal and well away from area of resection  Indication: 78 year old male found to have a bladder tumor presents for the previously mentioned operation.  Description of procedure:  The patient was identified and consent was obtained.  The patient was taken to the operating room and placed in the supine position.  The patient was placed under general anesthesia.  Perioperative antibiotics were administered.  The patient was placed in dorsal lithotomy.  Patient was prepped and draped in a standard sterile fashion and a timeout was performed.  A 26 French resectoscope with a visual obturator in place was advanced into the urethra and into the bladder.  This was exchanged for the resectoscope component.  On bipolar settings, I proceeded to resect the bladder tumor of interest completely.  There was no evidence of any perforation.  I fulgurated the entire bladder resection bed as well as the surrounding areas.  I inspected the remainder of the bladder and no other tumors were seen.  I collected all of the bladder chips specimens.  I reinspected the bladder and there was no active bleeding noted.  Therefore drained the bladder withdrew the scope.  This concluded the operation.  Patient tolerated the procedure well and was stable  postoperatively  In the PACU, gemcitabine was instilled into the bladder where it remained for approximately 1 hour prior to proper disposal  Plan: Return in 1 week for pathology review

## 2019-06-12 NOTE — H&P (Signed)
CC/HPI: CC: Elevated PSA, lower urinary tract symptoms, microscopic hematuria  HPI:  05/24/2019  PSA 7 months ago was 6.3. It was checked again 6 months ago and it was down to 4.9. Most recent check on 03/28/2019 was down further to 4.5. He is now 78 years of age. He is also referred for microscopic hematuria. Urinalysis on 04/24/2019 showed 10-20 red blood cell per high-power field. Creatinine at that time was 0.73 with a GFR greater than 60. Patient denies any history of gross hematuria or dysuria. He denies history of kidney stones. He does have a family history of prostate cancer in his uncle. His uncle ultimately passed away from something else. He denies a history urinary tract infection or prostatitis. He does have mild lower urinary tract symptoms including frequency, nocturia x3, postvoid dribbling, intermittency, weak stream. He does not take any medications for his prostate. Most bothersome complaint is nocturia. He would like to try something for this.   06/05/2019  Patient returns after undergoing a CT IVP. This revealed a small bladder tumor. He therefore presents for cystoscopy. Upper tract imaging negative.     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 Has not smoked since 05/22/1985.   Tobacco Use Assessment Completed: Used Tobacco in last 30 days? Drinks 2 caffeinated drinks per day.    REVIEW OF SYSTEMS:    GU Review Male:   Patient denies frequent urination, hard to postpone urination, burning/ pain with urination, get up at night to urinate, leakage of urine, stream starts and stops, trouble starting your stream, have to strain to urinate , erection problems, and penile pain.  Gastrointestinal (Upper):   Patient denies nausea, vomiting, and indigestion/ heartburn.  Gastrointestinal (Lower):   Patient denies diarrhea and constipation.  Constitutional:   Patient denies fever, night sweats, weight loss, and fatigue.  Skin:   Patient denies itching and skin rash/ lesion.  Eyes:   Patient denies blurred vision and double vision.  Ears/ Nose/ Throat:   Patient denies sore throat and sinus problems.  Hematologic/Lymphatic:   Patient denies swollen glands and easy bruising.  Cardiovascular:   Patient denies leg swelling and chest pains.  Respiratory:   Patient denies cough and shortness of breath.  Endocrine:   Patient denies excessive thirst.  Musculoskeletal:   Patient denies back pain and joint pain.  Neurological:   Patient denies headaches and dizziness.  Psychologic:   Patient denies depression and anxiety.   Notes: trouble sleeping with Flomax    VITAL SIGNS:      06/05/2019 02:09 PM  BP 120/71 mmHg  Heart Rate 67 /min  Temperature 97.1 F / 36.1 C   PAST  DATA REVIEWED:  Source Of History:  Patient  Records Review:   Previous Patient Records  X-Ray Review: C.T. Abdomen/Pelvis: Reviewed Films. Reviewed Report. Discussed With Patient.     PROCEDURES:         Flexible Cystoscopy - 52000  Risks, benefits, and some of the potential complications of the procedure were discussed at length with the patient including infection, bleeding, voiding  discomfort, urinary retention, fever, chills, sepsis, and others. All questions were answered. Informed consent was obtained. Antibiotic prophylaxis was given. Sterile technique and intraurethral analgesia were used.  Meatus:  Normal size. Normal location. Normal condition.  Urethra:  No strictures.  External Sphincter:  Normal.  Verumontanum:  Normal.  Prostate:  Borderline obstructing. Mild hyperplasia.  Bladder Neck:  Non-obstructing.  Ureteral Orifices:  Normal location. Normal size. Normal shape.   Bladder:  No trabeculation. About 2 cm left posterior papillary tumor. It did not appear to be on the lateral wall.. No stones.      The lower urinary tract was carefully examined. The procedure was well-tolerated and without complications. Antibiotic instructions were given. Instructions were given to call the office immediately for bloody urine, difficulty urinating, urinary retention, painful or frequent urination, fever, chills, nausea, vomiting or other illness. The patient stated that he understood these instructions and would comply with them.         Urinalysis w/Scope Dipstick Dipstick Cont'd Micro  Color: Amber Bilirubin: Neg mg/dL WBC/hpf: 10 - 20/hpf  Appearance: Cloudy Ketones: Neg mg/dL RBC/hpf: 3 - 10/hpf  Specific Gravity: 1.025 Blood: Trace ery/uL Bacteria: Rare (0-9/hpf)  pH: 5.5 Protein: 1+ mg/dL Cystals: NS (Not Seen)  Glucose: Neg mg/dL Urobilinogen: 1.0 mg/dL Casts: Hyaline    Nitrites: Neg Trichomonas: Not Present    Leukocyte Esterase: 3+ leu/uL Mucous: Present      Epithelial Cells: NS (Not Seen)      Yeast: NS (Not Seen)      Sperm: Not Present    Notes: MICROSCOPIC NOT CONCENTRATED    ASSESSMENT:      ICD-10 Details  1 GU:   Bladder tumor/neoplasm - D41.4   2   Microscopic hematuria - R31.21      PLAN:           Orders Labs Urine Culture          Document Letter(s):  Created for Patient: Clinical Summary         Notes:   Proceed with  transurethral resection of bladder tumor. He understands potential risks including but not limited to bleeding, infection, injury to surrounding structures, need for additional procedures. We will also plan for instillation of gemcitabine.   Cc: Dr. Danise Mina, M.D        Next Appointment:      Next Appointment: 06/15/2019 04:00 PM    Appointment Type: Male Cysto    Location: [facility] [phone]    Provider: Link Snuffer, III, M.D.    Reason for Visit: cysto, CT results    Signed by Link Snuffer, III, M.D. on 06/05/19 at 2:58 PM (EDT

## 2019-06-12 NOTE — Discharge Instructions (Signed)

## 2019-06-12 NOTE — Transfer of Care (Signed)
Immediate Anesthesia Transfer of Care Note  Patient: Blake Peterson  Procedure(s) Performed: TRANSURETHRAL RESECTION OF BLADDER TUMOR (TURBT) WITH GEMCITABINE IN PACU (N/A )  Patient Location: PACU  Anesthesia Type:General  Level of Consciousness: awake, alert  and oriented  Airway & Oxygen Therapy: Patient Spontanous Breathing and Patient connected to face mask oxygen  Post-op Assessment: Report given to RN and Post -op Vital signs reviewed and stable  Post vital signs: Reviewed and stable  Last Vitals:  Vitals Value Taken Time  BP 131/84 06/12/19 1341  Temp    Pulse 74 06/12/19 1343  Resp 15 06/12/19 1343  SpO2 100 % 06/12/19 1343  Vitals shown include unvalidated device data.  Last Pain:  Vitals:   06/12/19 1048  TempSrc: Oral         Complications: No apparent anesthesia complications

## 2019-06-13 ENCOUNTER — Encounter (HOSPITAL_COMMUNITY): Payer: Self-pay | Admitting: Urology

## 2019-06-14 LAB — SURGICAL PATHOLOGY

## 2019-06-20 DIAGNOSIS — R310 Gross hematuria: Secondary | ICD-10-CM | POA: Diagnosis not present

## 2019-06-20 DIAGNOSIS — C674 Malignant neoplasm of posterior wall of bladder: Secondary | ICD-10-CM | POA: Diagnosis not present

## 2019-06-21 ENCOUNTER — Other Ambulatory Visit: Payer: Self-pay | Admitting: Urology

## 2019-07-06 ENCOUNTER — Ambulatory Visit (INDEPENDENT_AMBULATORY_CARE_PROVIDER_SITE_OTHER): Payer: PPO | Admitting: Family Medicine

## 2019-07-06 ENCOUNTER — Encounter: Payer: Self-pay | Admitting: Family Medicine

## 2019-07-06 ENCOUNTER — Other Ambulatory Visit: Payer: Self-pay

## 2019-07-06 VITALS — BP 124/76 | HR 76 | Temp 98.4°F | Ht 68.0 in | Wt 201.2 lb

## 2019-07-06 DIAGNOSIS — E119 Type 2 diabetes mellitus without complications: Secondary | ICD-10-CM | POA: Diagnosis not present

## 2019-07-06 DIAGNOSIS — M059 Rheumatoid arthritis with rheumatoid factor, unspecified: Secondary | ICD-10-CM | POA: Diagnosis not present

## 2019-07-06 DIAGNOSIS — R600 Localized edema: Secondary | ICD-10-CM | POA: Diagnosis not present

## 2019-07-06 DIAGNOSIS — R972 Elevated prostate specific antigen [PSA]: Secondary | ICD-10-CM | POA: Diagnosis not present

## 2019-07-06 DIAGNOSIS — Z23 Encounter for immunization: Secondary | ICD-10-CM | POA: Diagnosis not present

## 2019-07-06 DIAGNOSIS — C679 Malignant neoplasm of bladder, unspecified: Secondary | ICD-10-CM | POA: Diagnosis not present

## 2019-07-06 DIAGNOSIS — E669 Obesity, unspecified: Secondary | ICD-10-CM

## 2019-07-06 DIAGNOSIS — E66811 Obesity, class 1: Secondary | ICD-10-CM

## 2019-07-06 NOTE — Patient Instructions (Addendum)
DUE TO COVID-19 ONLY ONE VISITOR IS ALLOWED TO COME WITH YOU AND STAY IN THE WAITING ROOM ONLY DURING PRE OP AND PROCEDURE DAY OF SURGERY. THE 1 VISITOR MAY VISIT WITH YOU AFTER SURGERY IN YOUR PRIVATE ROOM DURING VISITING HOURS ONLY!  YOU NEED TO HAVE A COVID 19 TEST ON_Tuesday 10/20/2020______ @__1040  am_____, THIS TEST MUST BE DONE BEFORE SURGERY, COME  Blake Peterson , [id number].  ([place]) ONCE YOUR COVID TEST IS COMPLETED, PLEASE BEGIN THE QUARANTINE INSTRUCTIONS AS OUTLINED IN YOUR HANDOUT.                Blake Peterson   Your procedure is scheduled on: Friday 07/14/2019   Report to [facility] Main  Entrance    Report to admitting at 10:00 AM     Call this number if you have problems the morning of surgery [phone]    Remember: Do not eat food or drink liquids after Midnight.     BRUSH YOUR TEETH MORNING OF SURGERY AND RINSE YOUR MOUTH OUT, NO CHEWING GUM CANDY OR MINTS.     Take these medicines the morning of surgery with A SIP OF WATER:  Lipitor, Atenolol, Flomax    DO NOT TAKE ANY DIABETIC MEDICATIONS DAY OF YOUR SURGERY                               You may not have any metal on your body including hair pins and              piercings  Do not wear jewelry, make-up, lotions, powders or perfumes, deodorant                          Men may shave face and neck.   Do not bring valuables to the hospital. [place].  Contacts, dentures or bridgework may not be worn into surgery.  Leave suitcase in the car. After surgery it may be brought to your room.     Patients discharged the day of surgery will not be allowed to drive home. IF YOU ARE HAVING SURGERY AND GOING HOME THE SAME DAY, YOU  MUST HAVE AN ADULT TO DRIVE YOU HOME AND BE WITH YOU FOR 24 HOURS. YOU MAY GO HOME BY TAXI OR UBER OR ORTHERWISE,  BUT AN ADULT MUST ACCOMPANY YOU HOME AND STAY WITH YOU FOR 24 HOURS.  Name and  phone number of your driver:spouse- OACZYS--[phone]                Please read over the following fact sheets you were given: _____________________________________________________________________             [facility] - Preparing for Surgery Before surgery, you can play an important role.  Because skin is not sterile, your skin needs to be as free of germs as possible.  You can reduce the number of germs on your skin by washing with CHG (chlorahexidine gluconate) soap before surgery.  CHG is an antiseptic cleaner which kills germs and bonds with the skin to continue killing germs even after washing. Please DO NOT use if you have an allergy to CHG or antibacterial soaps.  If your skin becomes reddened/irritated stop using the CHG and inform your nurse when you arrive at  Short Stay. Do not shave (including legs and underarms) for at least 48 hours prior to the first CHG shower.  You may shave your face/neck. Please follow these instructions carefully:  1.  Shower with CHG Soap the night before surgery and the  morning of Surgery.  2.  If you choose to wash your hair, wash your hair first as usual with your  normal  shampoo.  3.  After you shampoo, rinse your hair and body thoroughly to remove the  shampoo.                            4.  Use CHG as you would any other liquid soap.  You can apply chg directly  to the skin and wash                       Gently with a scrungie or clean washcloth.  5.  Apply the CHG Soap to your body ONLY FROM THE NECK DOWN.   Do not use on face/ open                           Wound or open sores. Avoid contact with eyes, ears mouth and genitals (private parts).                       Wash face,  Genitals (private parts) with your normal soap.             6.  Wash thoroughly, paying special attention to the area where your surgery  will be performed.  7.  Thoroughly rinse your body with warm water from the neck down.  8.  DO NOT shower/wash with your normal  soap after using and rinsing off  the CHG Soap.                9.  Pat yourself dry with a clean towel.            10.  Wear clean pajamas.            11.  Place clean sheets on your bed the night of your first shower and do not  sleep with pets. Day of Surgery : Do not apply any lotions/deodorants the morning of surgery.  Please wear clean clothes to the hospital/surgery center.  FAILURE TO FOLLOW THESE INSTRUCTIONS MAY RESULT IN THE CANCELLATION OF YOUR SURGERY PATIENT SIGNATURE_________________________________  NURSE SIGNATURE__________________________________  ________________________________________________________________________ How to Manage Your Diabetes Before and After Surgery  Why is it important to control my blood sugar before and after surgery? . Improving blood sugar levels before and after surgery helps healing and can limit problems. . A way of improving blood sugar control is eating a healthy diet by: o  Eating less sugar and carbohydrates o  Increasing activity/exercise o  Talking with your doctor about reaching your blood sugar goals . High blood sugars (greater than 180 mg/dL) can raise your risk of infections and slow your recovery, so you will need to focus on controlling your diabetes during the weeks before surgery. . Make sure that the doctor who takes care of your diabetes knows about your planned surgery including the date and location.  How do I manage my blood sugar before surgery? . Check your blood sugar at least 4 times a day, starting 2 days before surgery, to make sure that the  level is not too high or low. o Check your blood sugar the morning of your surgery when you wake up and every 2 hours until you get to the Short Stay unit. . If your blood sugar is less than 70 mg/dL, you will need to treat for low blood sugar: o Do not take insulin. o Treat a low blood sugar (less than 70 mg/dL) with  cup of clear juice (cranberry or apple), 4 glucose  tablets, OR glucose gel. o Recheck blood sugar in 15 minutes after treatment (to make sure it is greater than 70 mg/dL). If your blood sugar is not greater than 70 mg/dL on recheck, call [phone] for further instructions. . Report your blood sugar to the short stay nurse when you get to Short Stay.  . If you are admitted to the hospital after surgery: o Your blood sugar will be checked by the staff and you will probably be given insulin after surgery (instead of oral diabetes medicines) to make sure you have good blood sugar levels. o The goal for blood sugar control after surgery is 80-180 mg/dL.   WHAT DO I DO ABOUT MY DIABETES MEDICATION?  Marland Kitchen Do not take oral diabetes medicines (pills) the morning of surgery.  . THE NIGHT BEFORE SURGERY, Do not take glimepiride (Amaryl) Only it take it that morning       . THE MORNING OF SURGERY,  Do not take glimepiride (Amaryl)

## 2019-07-06 NOTE — Progress Notes (Signed)
This visit was conducted in person.  BP 124/76 (BP Location: Left Arm, Patient Position: Sitting, Cuff Size: Normal)   Pulse 76   Temp 98.4 F (36.9 C) (Temporal)   Ht 5\' 8"  (1.727 m)   Wt 201 lb 4 oz (91.3 kg)   SpO2 97%   BMI 30.60 kg/m    CC: 3 mo f/u visit Subjective:    Patient ID: LOYE VENTO, male    DOB: 1941-09-15, 78 y.o.   MRN: [id number]  HPI: DHYAN NOAH is a 78 y.o. male presenting on 07/06/2019 for Follow-up (Here for 3 mo f/u. [Last A1c, 06/08/19- 6.4])   RA - followed by rheum on MTX + prednisone PRN. Currently not on prednisone.   Bladder cancer - recently found, followed by urology s/p TURBT last month and treated with chemo through bladder. Planned rpt TURBT later this month. He notes nocturia x1. Hematuria after procedure has cleared up.   10 lb weight loss since last visit. Appetite decreased.   Leg swelling has resolved.   DM - checking sugars - 126 this morning. Continues glimepiride 1mg  with breakfast. No low sugars or hypoglycemic symptoms.  Lab Results  Component Value Date   HGBA1C 6.4 (H) 06/08/2019   Diabetic Foot Exam - Simple   Simple Foot Form Diabetic Foot exam was performed with the following findings: Yes 07/06/2019  9:32 AM  Visual Inspection No deformities, no ulcerations, no other skin breakdown bilaterally: Yes Sensation Testing Intact to touch and monofilament testing bilaterally: Yes Pulse Check Posterior Tibialis and Dorsalis pulse intact bilaterally: Yes Comments         Relevant past medical, surgical, family and social history reviewed and updated as indicated. Interim medical history since our last visit reviewed. Allergies and medications reviewed and updated. Outpatient Medications Prior to Visit  Medication Sig Dispense Refill  . acetaminophen (TYLENOL) 500 MG tablet Take 1 tablet (500 mg total) by mouth every 6 (six) hours as needed for moderate pain.    Marland Kitchen atenolol (TENORMIN) 50 MG tablet TAKE 1 TABLET  (50 MG TOTAL) BY MOUTH DAILY. (Patient taking differently: Take 50 mg by mouth daily. ) 90 tablet 3  . atorvastatin (LIPITOR) 80 MG tablet TAKE 1 TABLET (80 MG TOTAL) BY MOUTH AT BEDTIME. 90 tablet 3  . chlorthalidone (HYGROTON) 25 MG tablet TAKE 1 TABLET (25 MG TOTAL) BY MOUTH DAILY. (Patient taking differently: Take 25 mg by mouth daily. ) 90 tablet 3  . Cholecalciferol (VITAMIN D-3) 125 MCG (5000 UT) TABS Take 5,000 Units by mouth daily.    . enalapril (VASOTEC) 10 MG tablet TAKE 1 TABLET BY MOUTH TWICE A DAY (Patient taking differently: Take 10 mg by mouth 2 (two) times daily. ) 277 tablet 1  . folic acid (FOLVITE) 1 MG tablet Take 1 mg by mouth daily.    Marland Kitchen glimepiride (AMARYL) 1 MG tablet TAKE 1 TABLET (1 MG TOTAL) BY MOUTH DAILY WITH BREAKFAST. 90 tablet 1  . HYDROcodone-acetaminophen (NORCO/VICODIN) 5-325 MG tablet Take 1 tablet by mouth every 4 (four) hours as needed for moderate pain. 10 tablet 0  . KLOR-CON M10 10 MEQ tablet TAKE 1 TABLET BY MOUTH EVERY DAY (Patient taking differently: Take 10 mEq by mouth daily. ) 90 tablet 1  . methotrexate (RHEUMATREX) 2.5 MG tablet Take 20 mg by mouth once a week. Caution:Chemotherapy. Protect from light.    . Omega-3 Fatty Acids (FISH OIL PO) Take 300 mg by mouth daily.     Marland Kitchen  predniSONE (DELTASONE) 5 MG tablet Take 5 mg by mouth daily as needed (arthritis flare).     . tamsulosin (FLOMAX) 0.4 MG CAPS capsule Take 0.4 mg by mouth daily with lunch.     . vitamin B-12 (CYANOCOBALAMIN) 1000 MCG tablet Take 1,000 mcg by mouth daily.     No facility-administered medications prior to visit.      Per HPI unless specifically indicated in ROS section below Review of Systems Objective:    BP 124/76 (BP Location: Left Arm, Patient Position: Sitting, Cuff Size: Normal)   Pulse 76   Temp 98.4 F (36.9 C) (Temporal)   Ht 5\' 8"  (1.727 m)   Wt 201 lb 4 oz (91.3 kg)   SpO2 97%   BMI 30.60 kg/m   Wt Readings from Last 3 Encounters:  07/06/19 201 lb 4 oz  (91.3 kg)  06/08/19 201 lb 5 oz (91.3 kg)  04/28/19 210 lb 6 oz (95.4 kg)    Physical Exam Vitals signs and nursing note reviewed.  Constitutional:      General: He is not in acute distress.    Appearance: Normal appearance. He is well-developed. He is not ill-appearing.  HENT:     Head: Normocephalic and atraumatic.     Right Ear: External ear normal.     Left Ear: External ear normal.     Nose: Nose normal.     Mouth/Throat:     Pharynx: No oropharyngeal exudate.  Eyes:     General: No scleral icterus.    Conjunctiva/sclera: Conjunctivae normal.     Pupils: Pupils are equal, round, and reactive to light.  Neck:     Musculoskeletal: Normal range of motion and neck supple.  Cardiovascular:     Rate and Rhythm: Normal rate and regular rhythm.     Heart sounds: Normal heart sounds. No murmur.  Pulmonary:     Effort: Pulmonary effort is normal. No respiratory distress.     Breath sounds: Normal breath sounds. No wheezing or rales.  Musculoskeletal:     Comments: See HPI for foot exam if done  Lymphadenopathy:     Cervical: No cervical adenopathy.  Skin:    General: Skin is warm and dry.     Findings: No rash.  Neurological:     Mental Status: He is alert.       Lab Results  Component Value Date   CREATININE 0.98 06/08/2019   BUN 19 06/08/2019   NA 135 06/08/2019   K 4.0 06/08/2019   CL 99 06/08/2019   CO2 28 06/08/2019   Assessment & Plan:   Problem List Items Addressed This Visit    Seropositive rheumatoid arthritis ([place])    Followed by rheum now on MTX with PRN prednisone for flare. Appreciate rheum care.       RESOLVED: Pedal edema    This has resolved.       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 TURBT showed papillary bladder cancer with invasion into lamina propia but not into detrusor. S/p first chemo instillation into bladder during procedure, planned re-staging TURBT later this month.  Appreciate uro care. Record reviewed.       Obesity, Class I, BMI 30-34.9    10 lb weight loss since last seen here - attributes to decreased appetite since off prednisone. Will continue to monitor.       Elevated PSA    Saw urology, declined biopsy but rather decided to watch  given decreasing PSA trend.      Controlled type 2 diabetes mellitus without complication, without long-term current use of insulin ([facility])    Chronic, stable. Continue current regimen. Discussed continue amaryl for now, consider discontinuing if sugars start trending down if ongoing weight loss. He is motivated to continue efforts.        Other Visit Diagnoses    Need for influenza vaccination       Relevant Orders   Flu Vaccine QUAD High Dose(Fluad) (Completed)       No orders of the defined types were placed in this encounter.  Orders Placed This Encounter  Procedures  . Flu Vaccine QUAD High Dose(Fluad)    Patient Instructions  Flu shot today Good to see you today.  If sugars running consistently <90, stop glimepiride.  Keep physical appt in January.    Follow up plan: No follow-ups on file.  Ria Bush, MD

## 2019-07-06 NOTE — Assessment & Plan Note (Signed)
Followed by rheum now on MTX with PRN prednisone for flare. Appreciate rheum care.

## 2019-07-06 NOTE — Assessment & Plan Note (Signed)
Saw urology, declined biopsy but rather decided to watch given decreasing PSA trend.

## 2019-07-06 NOTE — Assessment & Plan Note (Signed)
Presented with incidental microhematuria. TURBT showed papillary bladder cancer with invasion into lamina propia but not into detrusor. S/p first chemo instillation into bladder during procedure, planned re-staging TURBT later this month. Appreciate uro care. Record reviewed.

## 2019-07-06 NOTE — Assessment & Plan Note (Addendum)
Chronic, stable. Continue current regimen. Discussed continue amaryl for now, consider discontinuing if sugars start trending down if ongoing weight loss. He is motivated to continue efforts.

## 2019-07-06 NOTE — Assessment & Plan Note (Signed)
10 lb weight loss since last seen here - attributes to decreased appetite since off prednisone. Will continue to monitor.

## 2019-07-06 NOTE — Patient Instructions (Addendum)
Flu shot today Good to see you today.  If sugars running consistently <90, stop glimepiride.  Keep physical appt in January.

## 2019-07-06 NOTE — Assessment & Plan Note (Signed)
This has resolved.

## 2019-07-07 ENCOUNTER — Encounter (HOSPITAL_COMMUNITY): Payer: Self-pay

## 2019-07-07 ENCOUNTER — Other Ambulatory Visit: Payer: Self-pay

## 2019-07-07 ENCOUNTER — Encounter (HOSPITAL_COMMUNITY)
Admission: RE | Admit: 2019-07-07 | Discharge: 2019-07-07 | Disposition: A | Discharge status: Home / Self Care | Payer: PPO | Source: Ambulatory Visit | Attending: Urology | Admitting: Urology

## 2019-07-07 DIAGNOSIS — E119 Type 2 diabetes mellitus without complications: Secondary | ICD-10-CM | POA: Diagnosis not present

## 2019-07-07 DIAGNOSIS — Z01812 Encounter for preprocedural laboratory examination: Secondary | ICD-10-CM | POA: Diagnosis not present

## 2019-07-07 LAB — HEMOGLOBIN A1C
Hgb A1c MFr Bld: 6.5 % — ABNORMAL HIGH (ref 4.8–5.6)
Mean Plasma Glucose: 139.85 mg/dL

## 2019-07-07 LAB — BASIC METABOLIC PANEL
Anion gap: 10 (ref 5–15)
BUN: 16 mg/dL (ref 8–23)
CO2: 27 mmol/L (ref 22–32)
Calcium: 9.6 mg/dL (ref 8.9–10.3)
Chloride: 96 mmol/L — ABNORMAL LOW (ref 98–111)
Creatinine, Ser: 0.86 mg/dL (ref 0.61–1.24)
GFR calc Af Amer: >60 mL/min (ref >60)
GFR calc non Af Amer: >60 mL/min (ref >60)
Glucose, Bld: 114 mg/dL — ABNORMAL HIGH (ref 70–99)
Potassium: 3.8 mmol/L (ref 3.5–5.1)
Sodium: 133 mmol/L — ABNORMAL LOW (ref 135–145)

## 2019-07-07 LAB — CBC
HCT: 44.5 % (ref 39.0–52.0)
Hemoglobin: 14.5 g/dL (ref 13.0–17.0)
MCH: 31 pg (ref 26.0–34.0)
MCHC: 32.6 g/dL (ref 30.0–36.0)
MCV: 95.1 fL (ref 80.0–100.0)
Platelets: 365 10*3/uL (ref 150–400)
RBC: 4.68 MIL/uL (ref 4.22–5.81)
RDW: 14.5 % (ref 11.5–15.5)
WBC: 10.1 10*3/uL (ref 4.0–10.5)
nRBC: 0 % (ref 0.0–0.2)

## 2019-07-07 LAB — GLUCOSE, CAPILLARY: Glucose-Capillary: 124 mg/dL — ABNORMAL HIGH (ref 70–99)

## 2019-07-07 NOTE — Progress Notes (Signed)
PCP -Dr. Ria Bush  LOV-07/06/2019 EPIC Cardiologist - none Neurology-Dr. Hemang [facility] LOV-05/03/2019  [facility] LOV-06/06/2019 Patient states took his Methotrexate this week but will not take it next week before his surgery.  Chest x-ray - 04/24/2019 I view EKG - 06/08/2019 Stress Test -many years ago  ECHO - 04/25/2019 Cardiac Cath -none   Sleep Study - none CPAP - none  Fasting Blood Sugar -109-140  Checks Blood Sugar ___1__ times a day  Blood Thinner Instructions:none Aspirin Instructions:Aspirin 81 mg-preventive prescribed by Dr. Danise Mina Last Dose:patient states 2 weeks ago  Anesthesia review:  Chart given to Konrad Felix, PA for review  Patient has history of HTN, Pre-Diabetes, bladder cancer  Patient denies shortness of breath, fever, cough and chest pain at PAT appointment   Patient verbalized understanding of instructions that were given to them at the PAT appointment. Patient was also instructed that they will need to review over the PAT instructions again at home before surgery.

## 2019-07-07 NOTE — Progress Notes (Signed)
   07/07/19 1029  OBSTRUCTIVE SLEEP APNEA  Have you ever been diagnosed with sleep apnea through a sleep study? No  Do you snore loudly (loud enough to be heard through closed doors)?  1  Do you often feel tired, fatigued, or sleepy during the daytime (such as falling asleep during driving or talking to someone)? 1  Has anyone observed you stop breathing during your sleep? 0  Do you have, or are you being treated for high blood pressure? 1  BMI more than 35 kg/m2? 0  Age > 50 (1-yes) 1  Neck circumference greater than:Male 16 inches or larger, Male 17inches or larger? 1  Male Gender (Yes=1) 1  Obstructive Sleep Apnea Score 6  Score 5 or greater  Results sent to PCP

## 2019-07-11 ENCOUNTER — Other Ambulatory Visit (HOSPITAL_COMMUNITY)
Admission: RE | Admit: 2019-07-11 | Discharge: 2019-07-11 | Disposition: A | Discharge status: Home / Self Care | Payer: PPO | Source: Ambulatory Visit | Attending: Urology | Admitting: Urology

## 2019-07-11 DIAGNOSIS — Z01812 Encounter for preprocedural laboratory examination: Secondary | ICD-10-CM | POA: Insufficient documentation

## 2019-07-11 DIAGNOSIS — Z20828 Contact with and (suspected) exposure to other viral communicable diseases: Secondary | ICD-10-CM | POA: Diagnosis not present

## 2019-07-13 LAB — NOVEL CORONAVIRUS, NAA (HOSP ORDER, SEND-OUT TO REF LAB; TAT 18-24 HRS): SARS-CoV-2, NAA: NOT DETECTED

## 2019-07-14 ENCOUNTER — Ambulatory Visit (HOSPITAL_COMMUNITY)
Admission: RE | Admit: 2019-07-14 | Discharge: 2019-07-14 | Disposition: A | Discharge status: Home / Self Care | Payer: PPO | Source: Other Acute Inpatient Hospital | Attending: Urology | Admitting: Urology

## 2019-07-14 ENCOUNTER — Ambulatory Visit (HOSPITAL_COMMUNITY): Payer: PPO | Admitting: Anesthesiology

## 2019-07-14 ENCOUNTER — Ambulatory Visit (HOSPITAL_COMMUNITY): Payer: PPO | Admitting: Physician Assistant

## 2019-07-14 ENCOUNTER — Encounter (HOSPITAL_COMMUNITY): Payer: Self-pay | Admitting: *Deleted

## 2019-07-14 ENCOUNTER — Encounter (HOSPITAL_COMMUNITY)
Admission: RE | Disposition: A | Discharge status: Home / Self Care | Payer: Self-pay | Source: Other Acute Inpatient Hospital | Attending: Urology

## 2019-07-14 DIAGNOSIS — Z7984 Long term (current) use of oral hypoglycemic drugs: Secondary | ICD-10-CM | POA: Diagnosis not present

## 2019-07-14 DIAGNOSIS — Z79899 Other long term (current) drug therapy: Secondary | ICD-10-CM | POA: Insufficient documentation

## 2019-07-14 DIAGNOSIS — E785 Hyperlipidemia, unspecified: Secondary | ICD-10-CM | POA: Diagnosis not present

## 2019-07-14 DIAGNOSIS — I10 Essential (primary) hypertension: Secondary | ICD-10-CM | POA: Diagnosis not present

## 2019-07-14 DIAGNOSIS — C679 Malignant neoplasm of bladder, unspecified: Secondary | ICD-10-CM | POA: Diagnosis not present

## 2019-07-14 DIAGNOSIS — R350 Frequency of micturition: Secondary | ICD-10-CM | POA: Insufficient documentation

## 2019-07-14 DIAGNOSIS — C674 Malignant neoplasm of posterior wall of bladder: Secondary | ICD-10-CM | POA: Diagnosis not present

## 2019-07-14 DIAGNOSIS — E7800 Pure hypercholesterolemia, unspecified: Secondary | ICD-10-CM | POA: Diagnosis not present

## 2019-07-14 DIAGNOSIS — R3912 Poor urinary stream: Secondary | ICD-10-CM | POA: Diagnosis not present

## 2019-07-14 DIAGNOSIS — E119 Type 2 diabetes mellitus without complications: Secondary | ICD-10-CM | POA: Insufficient documentation

## 2019-07-14 DIAGNOSIS — N401 Enlarged prostate with lower urinary tract symptoms: Secondary | ICD-10-CM | POA: Diagnosis not present

## 2019-07-14 DIAGNOSIS — Z7982 Long term (current) use of aspirin: Secondary | ICD-10-CM | POA: Insufficient documentation

## 2019-07-14 DIAGNOSIS — N138 Other obstructive and reflux uropathy: Secondary | ICD-10-CM | POA: Diagnosis not present

## 2019-07-14 DIAGNOSIS — R351 Nocturia: Secondary | ICD-10-CM | POA: Insufficient documentation

## 2019-07-14 DIAGNOSIS — Z87891 Personal history of nicotine dependence: Secondary | ICD-10-CM | POA: Insufficient documentation

## 2019-07-14 DIAGNOSIS — R3129 Other microscopic hematuria: Secondary | ICD-10-CM | POA: Diagnosis present

## 2019-07-14 DIAGNOSIS — D090 Carcinoma in situ of bladder: Secondary | ICD-10-CM | POA: Diagnosis not present

## 2019-07-14 HISTORY — PX: TRANSURETHRAL RESECTION OF BLADDER TUMOR: SHX2575

## 2019-07-14 LAB — GLUCOSE, CAPILLARY
Glucose-Capillary: 117 mg/dL — ABNORMAL HIGH (ref 70–99)
Glucose-Capillary: 109 mg/dL — ABNORMAL HIGH (ref 70–99)

## 2019-07-14 SURGERY — TURBT (TRANSURETHRAL RESECTION OF BLADDER TUMOR)
Anesthesia: General

## 2019-07-14 MED ORDER — SODIUM CHLORIDE 0.9 % IR SOLN
Status: DC | PRN
  Administered 2019-07-14: 6000 mL via INTRAVESICAL

## 2019-07-14 MED ORDER — FENTANYL CITRATE (PF) 100 MCG/2ML IJ SOLN: 25.0000 ug | INTRAMUSCULAR | Status: DC | PRN

## 2019-07-14 MED ORDER — PROPOFOL 10 MG/ML IV BOLUS
INTRAVENOUS | Status: DC | PRN
  Administered 2019-07-14 (×2): 50 mg via INTRAVENOUS
  Administered 2019-07-14: 150 mg via INTRAVENOUS

## 2019-07-14 MED ORDER — LIDOCAINE 2% (20 MG/ML) 5 ML SYRINGE
INTRAMUSCULAR | Status: AC
  Filled 2019-07-14: qty 5

## 2019-07-14 MED ORDER — PROPOFOL 10 MG/ML IV BOLUS
INTRAVENOUS | Status: AC
  Filled 2019-07-14: qty 20

## 2019-07-14 MED ORDER — FENTANYL CITRATE (PF) 100 MCG/2ML IJ SOLN
INTRAMUSCULAR | Status: DC | PRN
  Administered 2019-07-14 (×2): 25 ug via INTRAVENOUS

## 2019-07-14 MED ORDER — CEFAZOLIN SODIUM-DEXTROSE 2-4 GM/100ML-% IV SOLN
2.0000 g | INTRAVENOUS | Status: AC
  Administered 2019-07-14: 2 g via INTRAVENOUS
  Filled 2019-07-14: qty 100

## 2019-07-14 MED ORDER — HYDROCODONE-ACETAMINOPHEN 5-325 MG PO TABS: 1.0000 | ORAL_TABLET | ORAL | 0 refills | Status: AC | PRN

## 2019-07-14 MED ORDER — FENTANYL CITRATE (PF) 100 MCG/2ML IJ SOLN
INTRAMUSCULAR | Status: AC
  Filled 2019-07-14: qty 2

## 2019-07-14 MED ORDER — LACTATED RINGERS IV SOLN
INTRAVENOUS | Status: DC
  Administered 2019-07-14: 10:00:00 via INTRAVENOUS

## 2019-07-14 MED ORDER — DEXAMETHASONE SODIUM PHOSPHATE 10 MG/ML IJ SOLN
INTRAMUSCULAR | Status: DC | PRN
  Administered 2019-07-14: 8 mg via INTRAVENOUS

## 2019-07-14 MED ORDER — LIDOCAINE 2% (20 MG/ML) 5 ML SYRINGE
INTRAMUSCULAR | Status: DC | PRN
  Administered 2019-07-14: 50 mg via INTRAVENOUS

## 2019-07-14 SURGICAL SUPPLY — 16 items
BAG URINE DRAINAGE (UROLOGICAL SUPPLIES) IMPLANT
BAG URO CATCHER STRL LF (MISCELLANEOUS) ×3 IMPLANT
CATH FOLEY 2WAY SLVR  5CC 18FR (CATHETERS)
CATH FOLEY 2WAY SLVR 5CC 18FR (CATHETERS) IMPLANT
ELECT REM PT RETURN 15FT ADLT (MISCELLANEOUS) ×3 IMPLANT
GLOVE BIO SURGEON STRL SZ7.5 (GLOVE) ×3 IMPLANT
GOWN STRL REUS W/TWL XL LVL3 (GOWN DISPOSABLE) ×3 IMPLANT
KIT TURNOVER KIT A (KITS) IMPLANT
LOOP CUT BIPOLAR 24F LRG (ELECTROSURGICAL) IMPLANT
MANIFOLD NEPTUNE II (INSTRUMENTS) ×3 IMPLANT
PACK CYSTO (CUSTOM PROCEDURE TRAY) ×3 IMPLANT
PLUG CATH AND CAP STER (CATHETERS) IMPLANT
SYRINGE IRR TOOMEY STRL 70CC (SYRINGE) IMPLANT
TUBING CONNECTING 10 (TUBING) ×2 IMPLANT
TUBING CONNECTING 10' (TUBING) ×1
TUBING UROLOGY SET (TUBING) ×3 IMPLANT

## 2019-07-14 NOTE — Anesthesia Procedure Notes (Signed)
Procedure Name: Intubation Date/Time: 07/14/2019 1:28 PM Performed by: Anne Fu, CRNA Pre-anesthesia Checklist: Patient identified, Emergency Drugs available, Suction available, Patient being monitored and Timeout performed Patient Re-evaluated:Patient Re-evaluated prior to induction Oxygen Delivery Method: Circle system utilized Preoxygenation: Pre-oxygenation with 100% oxygen Induction Type: IV induction Ventilation: Mask ventilation without difficulty Laryngoscope Size: Mac and 4 Tube type: Oral Tube size: 7.5 mm Number of attempts: 1 Airway Equipment and Method: Stylet Placement Confirmation: ETT inserted through vocal cords under direct vision,  positive ETCO2 and breath sounds checked- equal and bilateral Tube secured with: Tape Dental Injury: Teeth and Oropharynx as per pre-operative assessment

## 2019-07-14 NOTE — Transfer of Care (Signed)
Immediate Anesthesia Transfer of Care Note  Patient: Blake Peterson  Procedure(s) Performed: Procedure(s): TRANSURETHRAL RESECTION OF BLADDER TUMOR (TURBT) (N/A)  Patient Location: PACU  Anesthesia Type:General  Level of Consciousness:  sedated, patient cooperative and responds to stimulation  Airway & Oxygen Therapy:Patient Spontanous Breathing and Patient connected to face mask oxgen  Post-op Assessment:  Report given to PACU RN and Post -op Vital signs reviewed and stable  Post vital signs:  Reviewed and stable  Last Vitals:  Vitals:   07/14/19 0957  BP: 140/83  Pulse: 68  Resp: 16  Temp: 37 C  SpO2: 916%    Complications: No apparent anesthesia complications

## 2019-07-14 NOTE — Anesthesia Preprocedure Evaluation (Addendum)
Anesthesia Evaluation  Patient identified by MRN, date of birth, ID band Patient awake    Reviewed: Allergy & Precautions, NPO status , Patient's Chart, lab work & pertinent test results  Airway Mallampati: II  TM Distance: >3 FB     Dental   Pulmonary former smoker,    breath sounds clear to auscultation       Cardiovascular hypertension,  Rhythm:Regular Rate:Normal     Neuro/Psych    GI/Hepatic negative GI ROS, Neg liver ROS,   Endo/Other  diabetes  Renal/GU      Musculoskeletal   Abdominal   Peds  Hematology   Anesthesia Other Findings   Reproductive/Obstetrics                             Anesthesia Physical Anesthesia Plan  ASA: III  Anesthesia Plan: General   Post-op Pain Management:    Induction: Intravenous  PONV Risk Score and Plan: 2 and Ondansetron and Dexamethasone  Airway Management Planned: Oral ETT  Additional Equipment:   Intra-op Plan:   Post-operative Plan: Extubation in OR  Informed Consent: I have reviewed the patients History and Physical, chart, labs and discussed the procedure including the risks, benefits and alternatives for the proposed anesthesia with the patient or authorized representative who has indicated his/her understanding and acceptance.     Dental advisory given  Plan Discussed with: CRNA and Anesthesiologist  Anesthesia Plan Comments:        Anesthesia Quick Evaluation

## 2019-07-14 NOTE — Op Note (Signed)
Operative Note  Preoperative diagnosis:  1.  T1 bladder cancer  Postoperative diagnosis: 1.  Same  Procedure(s): 1.  Restage transurethral resection of bladder tumor--medium  Surgeon: Link Snuffer, MD  Assistants: None  Anesthesia: General  Complications: None immediate  EBL: Minimal  Specimens: 1.  Bladder tumor  Drains/Catheters: 1.  None  Intraoperative findings: 1.  Normal anterior urethra 2.  Moderately obstructing prostate 3.  Bilateral ureteral orifices normal 4.  Evidence of prior transurethral resection with some surrounding erythema.  A total area of about 3 cm was resected.  Indication: 78 year old male with T1 bladder cancer presents for restage TURBT  Description of procedure:  The patient was identified and consent was obtained.  The patient was taken to the operating room and placed in the supine position.  The patient was placed under general anesthesia.  Perioperative antibiotics were administered.  The patient was placed in dorsal lithotomy.  Patient was prepped and draped in a standard sterile fashion and a timeout was performed.  A 26 French resectoscope with a visual obturator in place was advanced into the urethra and into the bladder.  Complete cystoscopy was performed and I exchanged this for the working element.  On bipolar settings, I resected the area of interest.  I fulgurated the resection bed.  I collected the specimen.  I reinspected the bladder and there was no active bleeding.  There were no other tumors.  I drained the bladder and withdrew the scope.  Patient tolerated the procedure well and was stable postoperatively.  Plan: Return in 1 week for pathology review

## 2019-07-14 NOTE — H&P (Signed)
CC/HPI: CC: Elevated PSA, lower urinary tract symptoms, microscopic hematuria  HPI:  05/24/2019  PSA 7 months ago was 6.3. It was checked again 6 months ago and it was down to 4.9. Most recent check on 03/28/2019 was down further to 4.5. He is now 78 years of age. He is also referred for microscopic hematuria. Urinalysis on 04/24/2019 showed 10-20 red blood cell per high-power field. Creatinine at that time was 0.73 with a GFR greater than 60. Patient denies any history of gross hematuria or dysuria. He denies history of kidney stones. He does have a family history of prostate cancer in his uncle. His uncle ultimately passed away from something else. He denies a history urinary tract infection or prostatitis. He does have mild lower urinary tract symptoms including frequency, nocturia x3, postvoid dribbling, intermittency, weak stream. He does not take any medications for his prostate. Most bothersome complaint is nocturia. He would like to try something for this.   06/05/2019  Patient returns after undergoing a CT IVP. This revealed a small bladder tumor. He therefore presents for cystoscopy. Upper tract imaging negative.   06/20/2019  Patient returns after undergoing a TURBT with instillation of gemcitabine. 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 Detrusor was present and not involved(pT1). He has no complaints.     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 Has not smoked since 05/22/1985.   Tobacco Use Assessment Completed: Used Tobacco in last 30 days? Drinks 2 caffeinated drinks per day.    REVIEW OF SYSTEMS:    GU Review Male:   Patient denies frequent urination, hard to postpone urination, burning/ pain with urination, get up at night to urinate, leakage of urine, stream starts and stops, trouble starting your stream, have to strain to urinate , erection problems, and penile pain.  Gastrointestinal (Upper):   Patient denies nausea, vomiting, and indigestion/ heartburn.  Gastrointestinal (Lower):   Patient denies diarrhea and constipation.  Constitutional:   Patient denies fever, night sweats, weight loss, and fatigue.  Skin:   Patient denies skin rash/ lesion and itching.  Eyes:   Patient denies blurred vision and double vision.  Ears/ Nose/ Throat:   Patient denies sore throat and sinus problems.  Hematologic/Lymphatic:   Patient denies swollen glands and easy bruising.  Cardiovascular:   Patient denies leg swelling and chest pains.  Respiratory:   Patient denies cough and shortness of breath.  Endocrine:   Patient denies excessive thirst.  Musculoskeletal:   Patient denies back pain and joint pain.  Neurological:   Patient denies headaches and dizziness.  Psychologic:   Patient denies  depression and anxiety.   Notes: Having a "Taste" issue    VITAL SIGNS:      06/20/2019 11:24 AM  BP 145/83 mmHg  Heart Rate 71 /min  Temperature 97.3 F / 36.2 C   MULTI-SYSTEM PHYSICAL EXAMINATION:    Constitutional: Well-nourished. No physical  deformities. Normally developed. Good grooming.  Respiratory: No labored breathing, no use of accessory muscles.   Cardiovascular: Normal temperature, adequate perfusion of extremities  Skin: No paleness, no jaundice  Neurologic / Psychiatric: Oriented to time, oriented to place, oriented to person. No depression, no anxiety, no agitation.  Gastrointestinal: No mass, no tenderness, no rigidity, non obese abdomen.  Eyes: Normal conjunctivae. Normal eyelids.  Musculoskeletal: Normal gait and station of head and neck.     PAST DATA REVIEWED:  Source Of History:  Patient  Records Review:   Pathology Reports, Previous Patient Records   PROCEDURES:          Urinalysis w/Scope Dipstick Dipstick Cont'd Micro  Color: Yellow Bilirubin: Neg mg/dL WBC/hpf: 6 - 10/hpf  Appearance: Clear Ketones: Neg mg/dL RBC/hpf: 3 - 10/hpf  Specific Gravity: 1.025 Blood: 3+ ery/uL Bacteria: NS (Not Seen)  pH: 6.0 Protein: 1+ mg/dL Cystals: NS (Not Seen)  Glucose: Neg mg/dL Urobilinogen: 1.0 mg/dL Casts: NS (Not Seen)    Nitrites: Neg Trichomonas: Not Present    Leukocyte Esterase: 1+ leu/uL Mucous: Present      Epithelial Cells: 0 - 5/hpf      Yeast: NS (Not Seen)      Sperm: Not Present    Notes: MICROSCOPIC NOT CONCENTRATED.    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 Risks and benefits discussed. BCG was discussed for future treatment as long as there is no tumor in detrusor.   CC: Dr. Danise Mina   Signed by Link Snuffer, III, M.D. on 06/20/19 at 11:48 AM (EDT

## 2019-07-14 NOTE — Discharge Instructions (Signed)

## 2019-07-14 NOTE — Anesthesia Postprocedure Evaluation (Signed)
Anesthesia Post Note  Patient: Blake Peterson  Procedure(s) Performed: TRANSURETHRAL RESECTION OF BLADDER TUMOR (TURBT) (N/A )     Patient location during evaluation: PACU Anesthesia Type: General Level of consciousness: awake Pain management: pain level controlled Vital Signs Assessment: post-procedure vital signs reviewed and stable Respiratory status: spontaneous breathing Postop Assessment: no apparent nausea or vomiting Anesthetic complications: no    Last Vitals:  Vitals:   07/14/19 1430 07/14/19 1508  BP: 125/78 (!) 144/78  Pulse: 61 63  Resp: 17 18  Temp: 36.5 C 36.4 C  SpO2: 96% 100%    Last Pain:  Vitals:   07/14/19 1508  TempSrc: Oral  PainSc: 0-No pain                 Chanz Cahall

## 2019-07-15 ENCOUNTER — Encounter (HOSPITAL_COMMUNITY): Payer: Self-pay | Admitting: Urology

## 2019-07-17 LAB — SURGICAL PATHOLOGY

## 2019-07-21 DIAGNOSIS — C674 Malignant neoplasm of posterior wall of bladder: Secondary | ICD-10-CM | POA: Diagnosis not present

## 2019-07-21 DIAGNOSIS — R3121 Asymptomatic microscopic hematuria: Secondary | ICD-10-CM | POA: Diagnosis not present

## 2019-07-24 ENCOUNTER — Encounter: Payer: Self-pay | Admitting: Family Medicine

## 2019-08-02 DIAGNOSIS — C674 Malignant neoplasm of posterior wall of bladder: Secondary | ICD-10-CM | POA: Diagnosis not present

## 2019-08-02 DIAGNOSIS — Z5111 Encounter for antineoplastic chemotherapy: Secondary | ICD-10-CM | POA: Diagnosis not present

## 2019-08-02 DIAGNOSIS — R3121 Asymptomatic microscopic hematuria: Secondary | ICD-10-CM | POA: Diagnosis not present

## 2019-08-11 DIAGNOSIS — R768 Other specified abnormal immunological findings in serum: Secondary | ICD-10-CM | POA: Diagnosis not present

## 2019-08-11 DIAGNOSIS — M059 Rheumatoid arthritis with rheumatoid factor, unspecified: Secondary | ICD-10-CM | POA: Diagnosis not present

## 2019-08-11 DIAGNOSIS — Z79899 Other long term (current) drug therapy: Secondary | ICD-10-CM | POA: Diagnosis not present

## 2019-08-16 DIAGNOSIS — Z5111 Encounter for antineoplastic chemotherapy: Secondary | ICD-10-CM | POA: Diagnosis not present

## 2019-08-16 DIAGNOSIS — C674 Malignant neoplasm of posterior wall of bladder: Secondary | ICD-10-CM | POA: Diagnosis not present

## 2019-08-23 DIAGNOSIS — Z5111 Encounter for antineoplastic chemotherapy: Secondary | ICD-10-CM | POA: Diagnosis not present

## 2019-08-23 DIAGNOSIS — C674 Malignant neoplasm of posterior wall of bladder: Secondary | ICD-10-CM | POA: Diagnosis not present

## 2019-08-30 DIAGNOSIS — C674 Malignant neoplasm of posterior wall of bladder: Secondary | ICD-10-CM | POA: Diagnosis not present

## 2019-08-30 DIAGNOSIS — R8271 Bacteriuria: Secondary | ICD-10-CM | POA: Diagnosis not present

## 2019-09-04 ENCOUNTER — Other Ambulatory Visit: Payer: Self-pay | Admitting: Family Medicine

## 2019-09-13 DIAGNOSIS — Z5111 Encounter for antineoplastic chemotherapy: Secondary | ICD-10-CM | POA: Diagnosis not present

## 2019-09-13 DIAGNOSIS — C674 Malignant neoplasm of posterior wall of bladder: Secondary | ICD-10-CM | POA: Diagnosis not present

## 2019-09-19 DIAGNOSIS — Z5111 Encounter for antineoplastic chemotherapy: Secondary | ICD-10-CM | POA: Diagnosis not present

## 2019-09-19 DIAGNOSIS — C674 Malignant neoplasm of posterior wall of bladder: Secondary | ICD-10-CM | POA: Diagnosis not present

## 2019-09-26 ENCOUNTER — Ambulatory Visit: Payer: PPO

## 2019-09-26 ENCOUNTER — Other Ambulatory Visit (INDEPENDENT_AMBULATORY_CARE_PROVIDER_SITE_OTHER): Payer: PPO

## 2019-09-26 ENCOUNTER — Other Ambulatory Visit: Payer: Self-pay | Admitting: Family Medicine

## 2019-09-26 ENCOUNTER — Ambulatory Visit (INDEPENDENT_AMBULATORY_CARE_PROVIDER_SITE_OTHER): Payer: PPO

## 2019-09-26 ENCOUNTER — Other Ambulatory Visit: Payer: Self-pay

## 2019-09-26 DIAGNOSIS — Z0000 Encounter for general adult medical examination without abnormal findings: Secondary | ICD-10-CM

## 2019-09-26 DIAGNOSIS — E559 Vitamin D deficiency, unspecified: Secondary | ICD-10-CM | POA: Diagnosis not present

## 2019-09-26 DIAGNOSIS — E1169 Type 2 diabetes mellitus with other specified complication: Secondary | ICD-10-CM | POA: Diagnosis not present

## 2019-09-26 DIAGNOSIS — E785 Hyperlipidemia, unspecified: Secondary | ICD-10-CM | POA: Diagnosis not present

## 2019-09-26 DIAGNOSIS — M059 Rheumatoid arthritis with rheumatoid factor, unspecified: Secondary | ICD-10-CM

## 2019-09-26 DIAGNOSIS — E119 Type 2 diabetes mellitus without complications: Secondary | ICD-10-CM

## 2019-09-26 DIAGNOSIS — R972 Elevated prostate specific antigen [PSA]: Secondary | ICD-10-CM

## 2019-09-26 DIAGNOSIS — M353 Polymyalgia rheumatica: Secondary | ICD-10-CM

## 2019-09-26 LAB — CBC WITH DIFFERENTIAL/PLATELET
Basophils Absolute: 0 10*3/uL (ref 0.0–0.1)
Basophils Relative: 0.3 % (ref 0.0–3.0)
Eosinophils Absolute: 0.1 10*3/uL (ref 0.0–0.7)
Eosinophils Relative: 0.9 % (ref 0.0–5.0)
HCT: 43.1 % (ref 39.0–52.0)
Hemoglobin: 14.3 g/dL (ref 13.0–17.0)
Lymphocytes Relative: 20.3 % (ref 12.0–46.0)
Lymphs Abs: 2.5 10*3/uL (ref 0.7–4.0)
MCHC: 33.2 g/dL (ref 30.0–36.0)
MCV: 94.4 fl (ref 78.0–100.0)
Monocytes Absolute: 1.1 10*3/uL — ABNORMAL HIGH (ref 0.1–1.0)
Monocytes Relative: 8.7 % (ref 3.0–12.0)
Neutro Abs: 8.6 10*3/uL — ABNORMAL HIGH (ref 1.4–7.7)
Neutrophils Relative %: 69.8 % (ref 43.0–77.0)
Platelets: 314 10*3/uL (ref 150.0–400.0)
RBC: 4.56 Mil/uL (ref 4.22–5.81)
RDW: 14.9 % (ref 11.5–15.5)
WBC: 12.3 10*3/uL — ABNORMAL HIGH (ref 4.0–10.5)

## 2019-09-26 LAB — COMPREHENSIVE METABOLIC PANEL
ALT: 21 U/L (ref 0–53)
AST: 18 U/L (ref 0–37)
Albumin: 4.2 g/dL (ref 3.5–5.2)
Alkaline Phosphatase: 89 U/L (ref 39–117)
BUN: 15 mg/dL (ref 6–23)
CO2: 28 mEq/L (ref 19–32)
Calcium: 10.2 mg/dL (ref 8.4–10.5)
Chloride: 96 mEq/L (ref 96–112)
Creatinine, Ser: 0.78 mg/dL (ref 0.40–1.50)
GFR: 96.53 mL/min (ref >60.00)
Glucose, Bld: 108 mg/dL — ABNORMAL HIGH (ref 70–99)
Potassium: 3.9 mEq/L (ref 3.5–5.1)
Sodium: 135 mEq/L (ref 135–145)
Total Bilirubin: 0.5 mg/dL (ref 0.2–1.2)
Total Protein: 6.9 g/dL (ref 6.0–8.3)

## 2019-09-26 LAB — HEMOGLOBIN A1C: Hgb A1c MFr Bld: 6.1 % (ref 4.6–6.5)

## 2019-09-26 LAB — PSA: PSA: 3.89 ng/mL (ref 0.10–4.00)

## 2019-09-26 LAB — LIPID PANEL
Cholesterol: 131 mg/dL (ref 0–200)
HDL: 38.4 mg/dL — ABNORMAL LOW (ref >39.00)
LDL Cholesterol: 65 mg/dL (ref 0–99)
NonHDL: 92.36
Total CHOL/HDL Ratio: 3
Triglycerides: 138 mg/dL (ref 0.0–149.0)
VLDL: 27.6 mg/dL (ref 0.0–40.0)

## 2019-09-26 LAB — VITAMIN D 25 HYDROXY (VIT D DEFICIENCY, FRACTURES): VITD: 57.32 ng/mL (ref 30.00–100.00)

## 2019-09-26 NOTE — Patient Instructions (Signed)
Blake Peterson , Thank you for taking time to come for your Medicare Wellness Visit. I appreciate your ongoing commitment to your health goals. Please review the following plan we discussed and let me know if I can assist you in the future.   Screening recommendations/referrals: Colonoscopy: Up to date, completed 02/09/2018 Recommended yearly ophthalmology/optometry visit for glaucoma screening and checkup Recommended yearly dental visit for hygiene and checkup  Vaccinations: Influenza vaccine: Up to date, completed 07/06/2019 Pneumococcal vaccine: Completed series Tdap vaccine: Up to date, completed 08/08/2013 Shingles vaccine: discussed    Advanced directives: Please bring a copy of your POA (Power of [place]) and/or Living Will to your next appointment.   Conditions/risks identified: diabetes, hypertension  Next appointment: 09/29/2019 @ 8:30 am   Preventive Care 34 Years and Older, Male Preventive care refers to lifestyle choices and visits with your health care provider that can promote health and wellness. What does preventive care include?  A yearly physical exam. This is also called an annual well check.  Dental exams once or twice a year.  Routine eye exams. Ask your health care provider how often you should have your eyes checked.  Personal lifestyle choices, including:  Daily care of your teeth and gums.  Regular physical activity.  Eating a healthy diet.  Avoiding tobacco and drug use.  Limiting alcohol use.  Practicing safe sex.  Taking low doses of aspirin every day.  Taking vitamin and mineral supplements as recommended by your health care provider. What happens during an annual well check? The services and screenings done by your health care provider during your annual well check will depend on your age, overall health, lifestyle risk factors, and family history of disease. Counseling  Your health care provider may ask you questions about your:  Alcohol  use.  Tobacco use.  Drug use.  Emotional well-being.  Home and relationship well-being.  Sexual activity.  Eating habits.  History of falls.  Memory and ability to understand (cognition).  Work and work Statistician. Screening  You may have the following tests or measurements:  Height, weight, and BMI.  Blood pressure.  Lipid and cholesterol levels. These may be checked every 5 years, or more frequently if you are over 24 years old.  Skin check.  Lung cancer screening. You may have this screening every year starting at age 32 if you have a 30-pack-year history of smoking and currently smoke or have quit within the past 15 years.  Fecal occult blood test (FOBT) of the stool. You may have this test every year starting at age 30.  Flexible sigmoidoscopy or colonoscopy. You may have a sigmoidoscopy every 5 years or a colonoscopy every 10 years starting at age 64.  Prostate cancer screening. Recommendations will vary depending on your family history and other risks.  Hepatitis C blood test.  Hepatitis B blood test.  Sexually transmitted disease (STD) testing.  Diabetes screening. This is done by checking your blood sugar (glucose) after you have not eaten for a while (fasting). You may have this done every 1-3 years.  Abdominal aortic aneurysm (AAA) screening. You may need this if you are a current or former smoker.  Osteoporosis. You may be screened starting at age 75 if you are at high risk. Talk with your health care provider about your test results, treatment options, and if necessary, the need for more tests. Vaccines  Your health care provider may recommend certain vaccines, such as:  Influenza vaccine. This is recommended every year.  Tetanus, diphtheria, and acellular pertussis (Tdap, Td) vaccine. You may need a Td booster every 10 years.  Zoster vaccine. You may need this after age 3.  Pneumococcal 13-valent conjugate (PCV13) vaccine. One dose is  recommended after age 11.  Pneumococcal polysaccharide (PPSV23) vaccine. One dose is recommended after age 47. Talk to your health care provider about which screenings and vaccines you need and how often you need them. This information is not intended to replace advice given to you by your health care provider. Make sure you discuss any questions you have with your health care provider. Document Released: 10/04/2015 Document Revised: 05/27/2016 Document Reviewed: 07/09/2015 Elsevier Interactive Patient Education  2017 [employer] Prevention in the Home Falls can cause injuries. They can happen to people of all ages. There are many things you can do to make your home safe and to help prevent falls. What can I do on the outside of my home?  Regularly fix the edges of walkways and driveways and fix any cracks.  Remove anything that might make you trip as you walk through a door, such as a raised step or threshold.  Trim any bushes or trees on the path to your home.  Use bright outdoor lighting.  Clear any walking paths of anything that might make someone trip, such as rocks or tools.  Regularly check to see if handrails are loose or broken. Make sure that both sides of any steps have handrails.  Any raised decks and porches should have guardrails on the edges.  Have any leaves, snow, or ice cleared regularly.  Use sand or salt on walking paths during winter.  Clean up any spills in your garage right away. This includes oil or grease spills. What can I do in the bathroom?  Use night lights.  Install grab bars by the toilet and in the tub and shower. Do not use towel bars as grab bars.  Use non-skid mats or decals in the tub or shower.  If you need to sit down in the shower, use a plastic, non-slip stool.  Keep the floor dry. Clean up any water that spills on the floor as soon as it happens.  Remove soap buildup in the tub or shower regularly.  Attach bath mats  securely with double-sided non-slip rug tape.  Do not have throw rugs and other things on the floor that can make you trip. What can I do in the bedroom?  Use night lights.  Make sure that you have a light by your bed that is easy to reach.  Do not use any sheets or blankets that are too big for your bed. They should not hang down onto the floor.  Have a firm chair that has side arms. You can use this for support while you get dressed.  Do not have throw rugs and other things on the floor that can make you trip. What can I do in the kitchen?  Clean up any spills right away.  Avoid walking on wet floors.  Keep items that you use a lot in easy-to-reach places.  If you need to reach something above you, use a strong step stool that has a grab bar.  Keep electrical cords out of the way.  Do not use floor polish or wax that makes floors slippery. If you must use wax, use non-skid floor wax.  Do not have throw rugs and other things on the floor that can make you trip. What can I do  with my stairs?  Do not leave any items on the stairs.  Make sure that there are handrails on both sides of the stairs and use them. Fix handrails that are broken or loose. Make sure that handrails are as long as the stairways.  Check any carpeting to make sure that it is firmly attached to the stairs. Fix any carpet that is loose or worn.  Avoid having throw rugs at the top or bottom of the stairs. If you do have throw rugs, attach them to the floor with carpet tape.  Make sure that you have a light switch at the top of the stairs and the bottom of the stairs. If you do not have them, ask someone to add them for you. What else can I do to help prevent falls?  Wear shoes that:  Do not have high heels.  Have rubber bottoms.  Are comfortable and fit you well.  Are closed at the toe. Do not wear sandals.  If you use a stepladder:  Make sure that it is fully opened. Do not climb a closed  stepladder.  Make sure that both sides of the stepladder are locked into place.  Ask someone to hold it for you, if possible.  Clearly mark and make sure that you can see:  Any grab bars or handrails.  First and last steps.  Where the edge of each step is.  Use tools that help you move around (mobility aids) if they are needed. These include:  Canes.  Walkers.  Scooters.  Crutches.  Turn on the lights when you go into a dark area. Replace any light bulbs as soon as they burn out.  Set up your furniture so you have a clear path. Avoid moving your furniture around.  If any of your floors are uneven, fix them.  If there are any pets around you, be aware of where they are.  Review your medicines with your doctor. Some medicines can make you feel dizzy. This can increase your chance of falling. Ask your doctor what other things that you can do to help prevent falls. This information is not intended to replace advice given to you by your health care provider. Make sure you discuss any questions you have with your health care provider. Document Released: 07/04/2009 Document Revised: 02/13/2016 Document Reviewed: 10/12/2014 Elsevier Interactive Patient Education  2017 [employer].

## 2019-09-26 NOTE — Progress Notes (Signed)
PCP notes:  Health Maintenance: Eye exam- Patient will schedule appointment in February.   Abnormal Screenings: none   Patient concerns: none   Nurse concerns: none   Next PCP appt.: 09/29/2019 @ 8:30 am

## 2019-09-26 NOTE — Progress Notes (Signed)
Subjective:   Blake Peterson is a 79 y.o. male who presents for Medicare Annual/Subsequent preventive examination.  Review of Systems: N/A   This visit is being conducted through telemedicine via telephone at the nurse health advisor's home address due to the COVID-19 pandemic. This patient has given me verbal consent via doximity to conduct this visit, patient states they are participating from their home address. Patient and myself are on the telephone call. There is no referral for this visit. Some vital signs may be absent or patient reported.    Patient identification: identified by name, DOB, and current address   Cardiac Risk Factors include: advanced age (>90men, >50 women);diabetes mellitus;hypertension;male gender     Objective:    Vitals: There were no vitals taken for this visit.  There is no height or weight on file to calculate BMI.  Advanced Directives 09/26/2019 07/07/2019 06/12/2019 06/08/2019 04/24/2019 03/03/2019 09/16/2018  Does Patient Have a Medical Advance Directive? Yes Yes Yes Yes Yes Yes Yes  Type of Paramedic of [place];Living will Living will Living will Living will Living will Living will [place];Living will  Does patient want to make changes to medical advance directive? - No - Patient declined No - Patient declined No - Patient declined No - Patient declined - -  Copy of [place] in Chart? No - copy requested - - - - - No - copy requested    Tobacco Social History   Tobacco Use  Smoking Status Former Smoker  . Quit date: 11/10/1984  . Years since quitting: 34.8  Smokeless Tobacco Never Used     Counseling given: Not Answered   Clinical Intake:  Pre-visit preparation completed: Yes  Pain : No/denies pain     Nutritional Risks: None Diabetes: Yes CBG done?: No Did pt. bring in CBG monitor from home?: No  How often do you need to have someone help you when you read  instructions, pamphlets, or other written materials from your doctor or pharmacy?: 1 - Never What is the last grade level you completed in school?: 12th  Interpreter Needed?: No  Information entered by :: CJohnson, LPN  Past Medical History:  Diagnosis Date  . Basal cell carcinoma 12/2016   R anterior tibia  . Cataract   . Diverticulosis of colon   . History of colonic polyps   . HLD (hyperlipidemia)   . HTN (hypertension)   . Hypertensive retinopathy of both eyes, grade 1    [name]  . 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 Pre-diabetes   . Prediabetes   . 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 CATARACT EXTRACTION, BILATERAL  06/2017  . COLONOSCOPY  11/2012   mild-mod diverticulosis, int hem, rec rpt 5 yrs [facility] Plan)  . COLONOSCOPY  01/2018   2 TA, mod diverticulosis, rpt 5 yrs [facility] Plan)  . POLYPECTOMY    . TRANSURETHRAL RESECTION OF BLADDER TUMOR N/A 06/12/2019   Procedure: TRANSURETHRAL RESECTION OF BLADDER TUMOR (TURBT) WITH GEMCITABINE IN PACU;  Surgeon: Lucas Mallow, MD;  Location: [facility];  Service: Urology;  Laterality: N/A;  . TRANSURETHRAL RESECTION OF BLADDER TUMOR N/A 07/14/2019   Procedure: TRANSURETHRAL RESECTION OF BLADDER TUMOR (TURBT);  Surgeon: Lucas Mallow, MD;  Location: [facility];  Service: Urology;  Laterality: N/A;  . UMBILICAL HERNIA REPAIR  01/07/04   Family History  Problem Relation Age of Onset  .  Hypertension Sister   . Colon cancer Sister 8  . Colon polyps Sister   . Heart attack Father 48  . Esophageal cancer Neg Hx   . Rectal cancer Neg Hx   . Stomach cancer Neg Hx    Social History   Socioeconomic History  . Marital status: Married    Spouse name: Not on file  . Number of children: 3  . Years of education: Not on file  . Highest education level: Not on file  Occupational History  . 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 Smoking status: Former Smoker     Quit date: 11/10/1984    Years since quitting: 34.8  . Smokeless tobacco: Never Used  Substance and Sexual Activity  . Alcohol use: Not Currently    Alcohol/week: 0.0 standard drinks  . Drug use: No  . Sexual activity: Never  Other Topics Concern  . Not on file  Social History Narrative   R handed   Lives with wife in single story home   Occupation: retired, was Clinical biochemist   Activity: enoys golfing, tries walking 3x/wk   Diet: good water, fruits/vegetables daily      Tested at risk of OSA during hospitalization (06/2019)   Social Determinants of Health   Financial Resource Strain: Low Risk   . Difficulty of Paying Living Expenses: Not hard at all  Food Insecurity: No Food Insecurity  . Worried About Charity fundraiser in the Last Year: Never true  . Ran Out of Food in the Last Year: Never true  Transportation Needs: No Transportation Needs  . Lack of Transportation (Medical): No  . Lack of Transportation (Non-Medical): No  Physical Activity: Inactive  . Days of Exercise per Week: 0 days  . Minutes of Exercise per Session: 0 min  Stress: No Stress Concern Present  . Feeling of Stress : Not at all  Social Connections:   . Frequency of Communication with Friends and Family: Not on file  . Frequency of Social Gatherings with Friends and Family: Not on file  . Attends Religious Services: Not on file  . Active Member of Clubs or Organizations: Not on file  . Attends Archivist Meetings: Not on file  . Marital Status: Not on file    Outpatient Encounter Medications as of 09/26/2019  Medication Sig  . acetaminophen (TYLENOL) 500 MG tablet Take 1 tablet (500 mg total) by mouth every 6 (six) hours as needed for moderate pain.  Marland Kitchen atenolol (TENORMIN) 50 MG tablet TAKE 1 TABLET (50 MG TOTAL) BY MOUTH DAILY. (Patient taking differently: Take 50 mg by mouth daily. )  . atorvastatin (LIPITOR) 80 MG tablet TAKE 1 TABLET (80 MG TOTAL) BY MOUTH AT BEDTIME.  . chlorthalidone  (HYGROTON) 25 MG tablet TAKE 1 TABLET (25 MG TOTAL) BY MOUTH DAILY. (Patient taking differently: Take 25 mg by mouth daily. )  . Cholecalciferol (VITAMIN D-3) 125 MCG (5000 UT) TABS Take 5,000 Units by mouth daily.  . enalapril (VASOTEC) 10 MG tablet TAKE 1 TABLET BY MOUTH TWICE A DAY (Patient taking differently: Take 10 mg by mouth 2 (two) times daily. )  . folic acid (FOLVITE) 1 MG tablet Take 1 mg by mouth daily.  Marland Kitchen glimepiride (AMARYL) 1 MG tablet TAKE 1 TABLET (1 MG TOTAL) BY MOUTH DAILY WITH BREAKFAST.  Marland Kitchen HYDROcodone-acetaminophen (NORCO/VICODIN) 5-325 MG tablet Take 1 tablet by mouth every 4 (four) hours as needed for moderate pain.  Marland Kitchen KLOR-CON M10 10 MEQ  tablet TAKE 1 TABLET BY MOUTH EVERY DAY  . methotrexate (RHEUMATREX) 2.5 MG tablet Take 20 mg by mouth once a week. Caution:Chemotherapy. Protect from light.  . Omega-3 Fatty Acids (FISH OIL PO) Take 300 mg by mouth daily.   . predniSONE (DELTASONE) 5 MG tablet Take 5 mg by mouth daily as needed (arthritis flare).   . tamsulosin (FLOMAX) 0.4 MG CAPS capsule Take 0.4 mg by mouth daily with lunch.   . vitamin B-12 (CYANOCOBALAMIN) 1000 MCG tablet Take 1,000 mcg by mouth daily.   No facility-administered encounter medications on file as of 09/26/2019.    Activities of Daily Living In your present state of health, do you have any difficulty performing the following activities: 09/26/2019 07/07/2019  Hearing? Y N  Comment wears hearing aid in left ear -  Vision? N N  Difficulty concentrating or making decisions? N N  Walking or climbing stairs? N N  Dressing or bathing? N N  Doing errands, shopping? N N  Preparing Food and eating ? N -  Using the Toilet? N -  In the past six months, have you accidently leaked urine? N -  Do you have problems with loss of bowel control? N -  Managing your Medications? N -  Managing your Finances? N -  Housekeeping or managing your Housekeeping? N -  Some recent data might be hidden    Patient Care  Team: Ria Bush, MD as PCP - General Thelma Comp, [place] as Consulting Physician (Optometry) Harriett Sine, MD as Consulting Physician (Dermatology)   Assessment:   This is a routine wellness examination for Rakeem.  Exercise Activities and Dietary recommendations Current Exercise Habits: The patient does not participate in regular exercise at present, Exercise limited by: None identified  Goals    . Increase physical activity     Starting 09/16/2018, I will continue to exercise at least 30 min 2 days per week.     . Patient Stated     09/26/2019, I will try to lose some weight and get around 180 lbs.       Fall Risk Fall Risk  09/26/2019 03/03/2019 09/16/2018 09/10/2017 08/27/2016  Falls in the past year? 0 0 0 No No  Number falls in past yr: 0 - - - -  Injury with Fall? 0 - - - -  Risk for fall due to : Medication side effect - - - -  Follow up Falls evaluation completed;Falls prevention discussed - - - -   Is the patient's home free of loose throw rugs in walkways, pet beds, electrical cords, etc?   yes      Grab bars in the bathroom? yes      Handrails on the stairs?   yes      Adequate lighting?   yes  Timed Get Up and Go Performed: N/A  Depression Screen PHQ 2/9 Scores 09/26/2019 09/16/2018 09/10/2017 09/10/2017  PHQ - 2 Score 0 0 0 0  PHQ- 9 Score 0 0 - -    Cognitive Function MMSE - Mini Mental State Exam 09/26/2019 09/16/2018 08/27/2016  Orientation to time 5 5 5   Orientation to Place 5 5 5   Registration 3 3 3   Attention/ Calculation 3 0 0  Recall 3 3 3   Language- name 2 objects - 0 0  Language- repeat 1 1 1   Language- follow 3 step command - 3 3  Language- read & follow direction - 0 0  Write a sentence - 0 0  Copy  design - 0 0  Total score - 20 20  Mini Cog  Mini-Cog screen was completed. Maximum score is 22. A value of 0 denotes this part of the MMSE was not completed or the patient failed this part of the Mini-Cog screening.        Immunization History  Administered Date(s) Administered  . Fluad Quad(high Dose 65+) 07/06/2019  . Influenza Split 06/17/2011, 06/15/2012  . Influenza Whole 07/16/2006, 07/01/2007, 06/20/2008, 06/19/2009, 06/11/2010  . Influenza,inj,Quad PF,6+ Mos 06/13/2013, 06/20/2014, 06/05/2015, 06/26/2016, 06/25/2017, 06/23/2018  . Pneumococcal Conjugate-13 08/10/2014  . Pneumococcal Polysaccharide-23 01/30/2009  . Td 06/09/2002, 08/08/2013  . Zoster 08/08/2009    Qualifies for Shingles Vaccine? Yes   Screening Tests Health Maintenance  Topic Date Due  . DTAP VACCINES (1) 12/17/1940  . OPHTHALMOLOGY EXAM  08/18/2019  . HEMOGLOBIN A1C  01/05/2020  . FOOT EXAM  07/05/2020  . COLONOSCOPY  02/10/2023  . DTaP/Tdap/Td (3 - Tdap) 08/09/2023  . TETANUS/TDAP  08/09/2023  . INFLUENZA VACCINE  Completed  . PNA vac Low Risk Adult  Completed   Cancer Screenings: Lung: Low Dose CT Chest recommended if Age 79-80 years, 30 pack-year currently smoking OR have quit w/in 15 years. Patient does not qualify. Colorectal: completed 02/09/2018  Additional Screenings:  Hepatitis C Screening: N/A      Plan:    Patient will try to lose some weight and get to around 180 lbs.   I have personally reviewed and noted the following in the patient's chart:   . Medical and social history . Use of alcohol, tobacco or illicit drugs  . Current medications and supplements . Functional ability and status . Nutritional status . Physical activity . Advanced directives . List of other physicians . Hospitalizations, surgeries, and ER visits in previous 12 months . Vitals . Screenings to include cognitive, depression, and falls . Referrals and appointments  In addition, I have reviewed and discussed with patient certain preventive protocols, quality metrics, and best practice recommendations. A written personalized care plan for preventive services as well as general preventive health recommendations were provided to  patient.     Andrez Grime, LPN  02/23/9934

## 2019-09-27 DIAGNOSIS — C674 Malignant neoplasm of posterior wall of bladder: Secondary | ICD-10-CM | POA: Diagnosis not present

## 2019-09-27 DIAGNOSIS — Z5111 Encounter for antineoplastic chemotherapy: Secondary | ICD-10-CM | POA: Diagnosis not present

## 2019-09-29 ENCOUNTER — Encounter: Payer: Self-pay | Admitting: Family Medicine

## 2019-09-29 ENCOUNTER — Encounter: Payer: PPO | Admitting: Family Medicine

## 2019-09-29 ENCOUNTER — Ambulatory Visit (INDEPENDENT_AMBULATORY_CARE_PROVIDER_SITE_OTHER): Payer: PPO | Admitting: Family Medicine

## 2019-09-29 ENCOUNTER — Other Ambulatory Visit: Payer: Self-pay

## 2019-09-29 VITALS — BP 122/68 | HR 63 | Temp 98.1°F | Ht 67.5 in | Wt 193.2 lb

## 2019-09-29 DIAGNOSIS — I700 Atherosclerosis of aorta: Secondary | ICD-10-CM

## 2019-09-29 DIAGNOSIS — R972 Elevated prostate specific antigen [PSA]: Secondary | ICD-10-CM

## 2019-09-29 DIAGNOSIS — M059 Rheumatoid arthritis with rheumatoid factor, unspecified: Secondary | ICD-10-CM

## 2019-09-29 DIAGNOSIS — Z7189 Other specified counseling: Secondary | ICD-10-CM | POA: Diagnosis not present

## 2019-09-29 DIAGNOSIS — E1169 Type 2 diabetes mellitus with other specified complication: Secondary | ICD-10-CM | POA: Diagnosis not present

## 2019-09-29 DIAGNOSIS — I10 Essential (primary) hypertension: Secondary | ICD-10-CM | POA: Diagnosis not present

## 2019-09-29 DIAGNOSIS — E785 Hyperlipidemia, unspecified: Secondary | ICD-10-CM

## 2019-09-29 DIAGNOSIS — Z0000 Encounter for general adult medical examination without abnormal findings: Secondary | ICD-10-CM | POA: Diagnosis not present

## 2019-09-29 DIAGNOSIS — E559 Vitamin D deficiency, unspecified: Secondary | ICD-10-CM | POA: Diagnosis not present

## 2019-09-29 DIAGNOSIS — E663 Overweight: Secondary | ICD-10-CM | POA: Diagnosis not present

## 2019-09-29 DIAGNOSIS — C679 Malignant neoplasm of bladder, unspecified: Secondary | ICD-10-CM | POA: Diagnosis not present

## 2019-09-29 DIAGNOSIS — E119 Type 2 diabetes mellitus without complications: Secondary | ICD-10-CM

## 2019-09-29 NOTE — Assessment & Plan Note (Signed)
Chronic stable. Will stop amaryl. He is now off prednisone.

## 2019-09-29 NOTE — Assessment & Plan Note (Signed)
Congratulated on weight loss to date. Pt motivated to continue healthy diet changes, planning on starting stationary bicycle as exercise routine.

## 2019-09-29 NOTE — Assessment & Plan Note (Signed)
Continue daily replacement

## 2019-09-29 NOTE — Assessment & Plan Note (Addendum)
Chronic, stable. Continue current regimen. 

## 2019-09-29 NOTE — Assessment & Plan Note (Signed)
Appreciate uro care.

## 2019-09-29 NOTE — Assessment & Plan Note (Signed)
Advanced directives:discussed, has packet at home and he will work on this. Doesn't want prolonged life support if terminal condition.Would want wife to be HCPOA. Will bring me copy.

## 2019-09-29 NOTE — Assessment & Plan Note (Signed)
Chronic, stable on statin - continue. The 10-year ASCVD risk score Blake Peterson DC Blake Peterson., et al., 2013) is: 45.3%   Values used to calculate the score:     Age: 79 years     Sex: Male     Is Non-Hispanic African American: No     Diabetic: Yes     Tobacco smoker: No     Systolic Blood Pressure: 443 mmHg     Is BP treated: Yes     HDL Cholesterol: 38.4 mg/dL     Total Cholesterol: 131 mg/dL

## 2019-09-29 NOTE — Assessment & Plan Note (Signed)
Appreciate rheum care. On MTX. Continue this.

## 2019-09-29 NOTE — Assessment & Plan Note (Deleted)
Continue flomax.

## 2019-09-29 NOTE — Assessment & Plan Note (Signed)
Preventative protocols reviewed and updated unless pt declined. Discussed healthy diet and lifestyle.  

## 2019-09-29 NOTE — Assessment & Plan Note (Addendum)
Continue statin. 

## 2019-09-29 NOTE — Assessment & Plan Note (Addendum)
PSA level has declined. He is not on 5a reductase inhibitor. Seeing urology for bladder cancer active treatment

## 2019-09-29 NOTE — Patient Instructions (Addendum)
If interested, check with pharmacy about new 2 shot shingles series (shingrix).  Check on [facility] website for updates on covid vaccine:  RadarLocations.no Bring me copy of living will to update your chart.  Schedule eye exam.  Stop glimepiride.  Return in 6 months for diabetes follow up .  Health Maintenance After Age 77 After age 28, you are at a higher risk for certain long-term diseases and infections as well as injuries from falls. Falls are a major cause of broken bones and head injuries in people who are older than age 6. Getting regular preventive care can help to keep you healthy and well. Preventive care includes getting regular testing and making lifestyle changes as recommended by your health care provider. Talk with your health care provider about:  Which screenings and tests you should have. A screening is a test that checks for a disease when you have no symptoms.  A diet and exercise plan that is right for you. What should I know about screenings and tests to prevent falls? Screening and testing are the best ways to find a health problem early. Early diagnosis and treatment give you the best chance of managing medical conditions that are common after age 60. Certain conditions and lifestyle choices may make you more likely to have a fall. Your health care provider may recommend:  Regular vision checks. Poor vision and conditions such as cataracts can make you more likely to have a fall. If you wear glasses, make sure to get your prescription updated if your vision changes.  Medicine review. Work with your health care provider to regularly review all of the medicines you are taking, including over-the-counter medicines. Ask your health care provider about any side effects that may make you more likely to have a fall. Tell your health care provider if any medicines that you take make you feel dizzy or sleepy.  Osteoporosis  screening. Osteoporosis is a condition that causes the bones to get weaker. This can make the bones weak and cause them to break more easily.  Blood pressure screening. Blood pressure changes and medicines to control blood pressure can make you feel dizzy.  Strength and balance checks. Your health care provider may recommend certain tests to check your strength and balance while standing, walking, or changing positions.  Foot health exam. Foot pain and numbness, as well as not wearing proper footwear, can make you more likely to have a fall.  Depression screening. You may be more likely to have a fall if you have a fear of falling, feel emotionally low, or feel unable to do activities that you used to do.  Alcohol use screening. Using too much alcohol can affect your balance and may make you more likely to have a fall. What actions can I take to lower my risk of falls? General instructions  Talk with your health care provider about your risks for falling. Tell your health care provider if: ? You fall. Be sure to tell your health care provider about all falls, even ones that seem minor. ? You feel dizzy, sleepy, or off-balance.  Take over-the-counter and prescription medicines only as told by your health care provider. These include any supplements.  Eat a healthy diet and maintain a healthy weight. A healthy diet includes low-fat dairy products, low-fat (lean) meats, and fiber from whole grains, beans, and lots of fruits and vegetables. Home safety  Remove any tripping hazards, such as rugs, cords, and clutter.  Install safety equipment such as grab  bars in bathrooms and safety rails on stairs.  Keep rooms and walkways well-lit. Activity   Follow a regular exercise program to stay fit. This will help you maintain your balance. Ask your health care provider what types of exercise are appropriate for you.  If you need a cane or walker, use it as recommended by your health care  provider.  Wear supportive shoes that have nonskid soles. Lifestyle  Do not drink alcohol if your health care provider tells you not to drink.  If you drink alcohol, limit how much you have: ? 0-1 drink a day for women. ? 0-2 drinks a day for men.  Be aware of how much alcohol is in your drink. In the [place] one drink equals one typical bottle of beer (12 oz), one-half glass of wine (5 oz), or one shot of hard liquor (1 oz).  Do not use any products that contain nicotine or tobacco, such as cigarettes and e-cigarettes. If you need help quitting, ask your health care provider. Summary  Having a healthy lifestyle and getting preventive care can help to protect your health and wellness after age 34.  Screening and testing are the best way to find a health problem early and help you avoid having a fall. Early diagnosis and treatment give you the best chance for managing medical conditions that are more common for people who are older than age 77.  Falls are a major cause of broken bones and head injuries in people who are older than age 30. Take precautions to prevent a fall at home.  Work with your health care provider to learn what changes you can make to improve your health and wellness and to prevent falls. This information is not intended to replace advice given to you by your health care provider. Make sure you discuss any questions you have with your health care provider. Document Revised: 12/29/2018 Document Reviewed: 07/21/2017 Elsevier Patient Education  2020 [employer].

## 2019-09-29 NOTE — Progress Notes (Signed)
This visit was conducted in person.  BP 122/68 (BP Location: Left Arm, Patient Position: Sitting, Cuff Size: Normal)   Pulse 63   Temp 98.1 F (36.7 C) (Temporal)   Ht 5' 7.5" (1.715 m)   Wt 193 lb 4 oz (87.7 kg)   SpO2 97%   BMI 29.82 kg/m    CC: CPE Subjective:    Patient ID: Blake Peterson, male    DOB: 1940/10/03, 79 y.o.   MRN: [id number]  HPI: Blake Peterson is a 79 y.o. male presenting on 09/29/2019 for Annual Exam (Prt 2. )   Saw health advisor for medicare wellness visit. Note reviewed.   No exam data present    Clinical Support from 09/26/2019 in [place] at [facility] Total Score  0      Fall Risk  09/26/2019 03/03/2019 09/16/2018 09/10/2017 08/27/2016  Falls in the past year? 0 0 0 No No  Number falls in past yr: 0 - - - -  Injury with Fall? 0 - - - -  Risk for fall due to : Medication side effect - - - -  Follow up Falls evaluation completed;Falls prevention discussed - - - -    Another 8 lbs down since last visit, total 17 lbs since 04/2019. States he's changing diet to healthier options.   Papillary bladder cancer s/p TURBT x2, completed BCG treatments this week. Upcoming f/u with urology.   RA on MTX followed by rheum, upcoming appt later this month.   DM - now off prednisone. Continues amaryl. No low sugars. A1c reviewed with patient.  Preventative: COLONOSCOPY 01/2018 - 2 TA, mod diverticulosis, rpt 5 yrs [facility] Plan) Prostate - always normal.Strong stream.Noticing increasing nocturia x1-2. Uncle with prostate cancer. Had recent DRE through urology.  Flu shotyearly Tetanus shot - 2014 Pneumovax 2010, prevnar 2015 [id number] shingrix - discussed - check at pharmacy Covid vaccine - interested. Discussed  Advanced directives:discussed, has packet at home and he will work on this. Doesn't want prolonged life support if terminal condition.Would want wife to be HCPOA. Will bring me copy.  Seat belt use discussed Sunscreen use  discussed. No changing moles on skin. Overdue for derm check.  Ex smoker (remotely)  Alcohol - none  Dentist - overdue  Eye exam yearly  Bowel - some constipation managed with stool softener PRN Bladder - no incontinence  Lives with wife  Occupation: retired, was Clinical biochemist Activity: enoys golfing,mows lawn,tries walking 3x/wk Diet: good water, fruits/vegetables daily     Relevant past medical, surgical, family and social history reviewed and updated as indicated. Interim medical history since our last visit reviewed. Allergies and medications reviewed and updated. Outpatient Medications Prior to Visit  Medication Sig Dispense Refill  . acetaminophen (TYLENOL) 500 MG tablet Take 1 tablet (500 mg total) by mouth every 6 (six) hours as needed for moderate pain.    Marland Kitchen atenolol (TENORMIN) 50 MG tablet TAKE 1 TABLET (50 MG TOTAL) BY MOUTH DAILY. (Patient taking differently: Take 50 mg by mouth daily. ) 90 tablet 3  . atorvastatin (LIPITOR) 80 MG tablet TAKE 1 TABLET (80 MG TOTAL) BY MOUTH AT BEDTIME. 90 tablet 3  . chlorthalidone (HYGROTON) 25 MG tablet TAKE 1 TABLET (25 MG TOTAL) BY MOUTH DAILY. (Patient taking differently: Take 25 mg by mouth daily. ) 90 tablet 3  . Cholecalciferol (VITAMIN D-3) 125 MCG (5000 UT) TABS Take 5,000 Units by mouth daily.    . enalapril (VASOTEC) 10 MG tablet TAKE 1  TABLET BY MOUTH TWICE A DAY (Patient taking differently: Take 10 mg by mouth 2 (two) times daily. ) 209 tablet 1  . folic acid (FOLVITE) 1 MG tablet Take 1 mg by mouth daily.    Marland Kitchen HYDROcodone-acetaminophen (NORCO/VICODIN) 5-325 MG tablet Take 1 tablet by mouth every 4 (four) hours as needed for moderate pain. 8 tablet 0  . KLOR-CON M10 10 MEQ tablet TAKE 1 TABLET BY MOUTH EVERY DAY 90 tablet 0  . methotrexate (RHEUMATREX) 2.5 MG tablet Take 20 mg by mouth once a week. Caution:Chemotherapy. Protect from light.    . Omega-3 Fatty Acids (FISH OIL PO) Take 300 mg by mouth daily.     . predniSONE  (DELTASONE) 5 MG tablet Take 5 mg by mouth daily as needed (arthritis flare).     . tamsulosin (FLOMAX) 0.4 MG CAPS capsule Take 0.4 mg by mouth daily with lunch.     . vitamin B-12 (CYANOCOBALAMIN) 1000 MCG tablet Take 1,000 mcg by mouth daily.    Marland Kitchen glimepiride (AMARYL) 1 MG tablet TAKE 1 TABLET (1 MG TOTAL) BY MOUTH DAILY WITH BREAKFAST. 90 tablet 0   No facility-administered medications prior to visit.     Per HPI unless specifically indicated in ROS section below Review of Systems  Constitutional: Negative for activity change, appetite change, chills, fatigue, fever and unexpected weight change.  HENT: Negative for hearing loss.   Eyes: Negative for visual disturbance.  Respiratory: Negative for cough, chest tightness, shortness of breath and wheezing.   Cardiovascular: Negative for chest pain, palpitations and leg swelling.  Gastrointestinal: Positive for constipation. Negative for abdominal distention, abdominal pain, blood in stool, diarrhea, nausea and vomiting.  Genitourinary: Negative for difficulty urinating and hematuria.  Musculoskeletal: Negative for arthralgias, myalgias and neck pain.  Skin: Negative for rash.  Neurological: Negative for dizziness, seizures, syncope and headaches.  Hematological: Negative for adenopathy. Does not bruise/bleed easily.  Psychiatric/Behavioral: Negative for dysphoric mood. The patient is not nervous/anxious.    Objective:    BP 122/68 (BP Location: Left Arm, Patient Position: Sitting, Cuff Size: Normal)   Pulse 63   Temp 98.1 F (36.7 C) (Temporal)   Ht 5' 7.5" (1.715 m)   Wt 193 lb 4 oz (87.7 kg)   SpO2 97%   BMI 29.82 kg/m   Wt Readings from Last 3 Encounters:  09/29/19 193 lb 4 oz (87.7 kg)  07/07/19 201 lb 6.4 oz (91.4 kg)  07/06/19 201 lb 4 oz (91.3 kg)    Physical Exam Vitals and nursing note reviewed.  Constitutional:      General: He is not in acute distress.    Appearance: Normal appearance. He is well-developed. He is  not ill-appearing.  HENT:     Head: Normocephalic and atraumatic.     Right Ear: Hearing, tympanic membrane, ear canal and external ear normal.     Left Ear: Hearing, tympanic membrane, ear canal and external ear normal.     Mouth/Throat:     Pharynx: Uvula midline.  Eyes:     General: No scleral icterus.    Conjunctiva/sclera: Conjunctivae normal.     Pupils: Pupils are equal, round, and reactive to light.  Neck:     Vascular: No carotid bruit.  Cardiovascular:     Rate and Rhythm: Normal rate and regular rhythm.     Pulses: Normal pulses.          Radial pulses are 2+ on the right side and 2+ on the left side.  Heart sounds: Normal heart sounds. No murmur.  Pulmonary:     Effort: Pulmonary effort is normal. No respiratory distress.     Breath sounds: Normal breath sounds. No wheezing, rhonchi or rales.  Abdominal:     General: Abdomen is flat. Bowel sounds are normal. There is no distension.     Palpations: Abdomen is soft. There is no mass.     Tenderness: There is no abdominal tenderness. There is no guarding or rebound.     Hernia: No hernia is present.  Musculoskeletal:        General: Normal range of motion.     Cervical back: Normal range of motion and neck supple.     Right lower leg: No edema.     Left lower leg: No edema.  Lymphadenopathy:     Cervical: No cervical adenopathy.  Skin:    General: Skin is warm and dry.     Findings: No rash.  Neurological:     General: No focal deficit present.     Mental Status: He is alert and oriented to person, place, and time.     Comments: CN grossly intact, station and gait intact  Psychiatric:        Mood and Affect: Mood normal.        Behavior: Behavior normal.        Thought Content: Thought content normal.        Judgment: Judgment normal.       Results for orders placed or performed in visit on 09/26/19  CBC with Differential  Result Value Ref Range   WBC 12.3 (H) 4.0 - 10.5 K/uL   RBC 4.56 4.22 - 5.81  Mil/uL   Hemoglobin 14.3 13.0 - 17.0 g/dL   HCT 43.1 39.0 - 52.0 %   MCV 94.4 78.0 - 100.0 fl   MCHC 33.2 30.0 - 36.0 g/dL   RDW 14.9 11.5 - 15.5 %   Platelets 314.0 150.0 - 400.0 K/uL   Neutrophils Relative % 69.8 43.0 - 77.0 %   Lymphocytes Relative 20.3 12.0 - 46.0 %   Monocytes Relative 8.7 3.0 - 12.0 %   Eosinophils Relative 0.9 0.0 - 5.0 %   Basophils Relative 0.3 0.0 - 3.0 %   Neutro Abs 8.6 (H) 1.4 - 7.7 K/uL   Lymphs Abs 2.5 0.7 - 4.0 K/uL   Monocytes Absolute 1.1 (H) 0.1 - 1.0 K/uL   Eosinophils Absolute 0.1 0.0 - 0.7 K/uL   Basophils Absolute 0.0 0.0 - 0.1 K/uL  vit d  Result Value Ref Range   VITD 57.32 30.00 - 100.00 ng/mL  PSA  Result Value Ref Range   PSA 3.89 0.10 - 4.00 ng/mL  Hemoglobin A1c  Result Value Ref Range   Hgb A1c MFr Bld 6.1 4.6 - 6.5 %  Comprehensive metabolic panel  Result Value Ref Range   Sodium 135 135 - 145 mEq/L   Potassium 3.9 3.5 - 5.1 mEq/L   Chloride 96 96 - 112 mEq/L   CO2 28 19 - 32 mEq/L   Glucose, Bld 108 (H) 70 - 99 mg/dL   BUN 15 6 - 23 mg/dL   Creatinine, Ser 0.78 0.40 - 1.50 mg/dL   Total Bilirubin 0.5 0.2 - 1.2 mg/dL   Alkaline Phosphatase 89 39 - 117 U/L   AST 18 0 - 37 U/L   ALT 21 0 - 53 U/L   Total Protein 6.9 6.0 - 8.3 g/dL   Albumin 4.2 3.5 - 5.2  g/dL   GFR 96.53 >60.00 mL/min   Calcium 10.2 8.4 - 10.5 mg/dL  Lipid panel  Result Value Ref Range   Cholesterol 131 0 - 200 mg/dL   Triglycerides 138.0 0.0 - 149.0 mg/dL   HDL 38.40 (L) >39.00 mg/dL   VLDL 27.6 0.0 - 40.0 mg/dL   LDL Cholesterol 65 0 - 99 mg/dL   Total CHOL/HDL Ratio 3    NonHDL 92.36    Assessment & Plan:  This visit occurred during the SARS-CoV-2 public health emergency.  Safety protocols were in place, including screening questions prior to the visit, additional usage of staff PPE, and extensive cleaning of exam room while observing appropriate contact time as indicated for disinfecting solutions.   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       Seropositive rheumatoid arthritis ([place])    Appreciate rheum care. On MTX. Continue this.      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       Overweight (BMI 25.0-29.9)    Congratulated on weight loss to date. Pt motivated to continue healthy diet changes, planning on starting stationary bicycle as exercise routine.       Hyperlipidemia associated with type 2 diabetes mellitus (HCC)    Chronic, stable on statin - continue. The 10-year ASCVD risk score Mikey Bussing DC Brooke Bonito., et al., 2013) is: 45.3%   Values used to calculate the score:     Age: 88 years     Sex: Male     Is Non-Hispanic African American: No     Diabetic: Yes     Tobacco smoker: No     Systolic Blood Pressure: 161 mmHg     Is BP treated: Yes     HDL Cholesterol: 38.4 mg/dL     Total Cholesterol: 131 mg/dL       HTN (hypertension)    Chronic, stable. Continue current regimen.       Health maintenance examination - Primary    Preventative protocols reviewed and updated unless pt declined. Discussed healthy diet and lifestyle.       Elevated PSA    PSA level has declined. He is not on 5a reductase inhibitor. Seeing urology for bladder cancer active treatment      Controlled type 2 diabetes mellitus without complication, without long-term current use of insulin (HCC)    Chronic stable. Will stop amaryl. He is now off prednisone.       Advanced care planning/counseling discussion    Advanced directives:discussed, has packet at home and he will work on this. Doesn't want prolonged life support if terminal condition.Would want wife to be HCPOA. Will bring me copy.           No orders of the defined types were placed in this encounter.  No orders of the defined types were placed in this encounter.   Patient instructions: If interested, check with pharmacy about  new 2 shot shingles series (shingrix).  Check on [facility] website for updates on covid vaccine:  RadarLocations.no Bring me copy of living will to update your chart.  Schedule eye exam.  Stop glimepiride.  Return in 6 months for diabetes follow up .  Follow up plan: Return in about 6 months (around 03/28/2020) for follow up visit.  Ria Bush, MD

## 2019-10-04 ENCOUNTER — Other Ambulatory Visit: Payer: Self-pay | Admitting: Family Medicine

## 2019-10-04 DIAGNOSIS — C674 Malignant neoplasm of posterior wall of bladder: Secondary | ICD-10-CM | POA: Diagnosis not present

## 2019-10-06 DIAGNOSIS — Z79899 Other long term (current) drug therapy: Secondary | ICD-10-CM | POA: Diagnosis not present

## 2019-10-06 DIAGNOSIS — M059 Rheumatoid arthritis with rheumatoid factor, unspecified: Secondary | ICD-10-CM | POA: Diagnosis not present

## 2019-10-06 DIAGNOSIS — R768 Other specified abnormal immunological findings in serum: Secondary | ICD-10-CM | POA: Diagnosis not present

## 2019-10-06 DIAGNOSIS — M0630 Rheumatoid nodule, unspecified site: Secondary | ICD-10-CM | POA: Diagnosis not present

## 2019-10-18 ENCOUNTER — Ambulatory Visit: Payer: PPO

## 2019-10-21 ENCOUNTER — Other Ambulatory Visit: Payer: Self-pay | Admitting: Family Medicine

## 2019-10-24 ENCOUNTER — Telehealth: Payer: Self-pay

## 2019-10-24 NOTE — Telephone Encounter (Signed)
Pt said he seen rheumatoid dr and pt is taking methotrexate but it is not effective. On 10/30/19 pt is supposed to start taking infusion of infliximab; pt will take 2 infusions within one month and then will take 1 infusion monthly. Pt wants to make sure will not interfere or interact with any of his other meds. Pt is also supposed to take 1st covid vaccine on 10/27/19. Pt wants Dr Synthia Innocent opinion if these 2 things are OK for him to do. Pt request cb from Dr Darnell Level after reviews this note.

## 2019-10-24 NOTE — Telephone Encounter (Signed)
Would ideally want to finish covid vaccines prior to starting infliximab.  If having lots of pain however, should be ok to go ahead and get infliximab after he gets first covid shot.

## 2019-10-25 NOTE — Telephone Encounter (Addendum)
Spoke with pt relaying Dr. Synthia Innocent message.  Pt verbalizes understanding.  Says he'll go ahead with COVID vaccine.

## 2019-10-27 ENCOUNTER — Ambulatory Visit: Payer: PPO | Attending: Internal Medicine

## 2019-10-27 DIAGNOSIS — Z23 Encounter for immunization: Secondary | ICD-10-CM | POA: Insufficient documentation

## 2019-10-27 NOTE — Progress Notes (Signed)
   Covid-19 Vaccination Clinic  Name:  [name]    MRN: [id number] DOB: 08/20/1941  10/27/2019  [name] was observed post Covid-19 immunization for 15 minutes without incidence. He was provided with Vaccine Information Sheet and instruction to access the V-Safe system.   [name] was instructed to call 911 with any severe reactions post vaccine: Marland Kitchen Difficulty breathing  . Swelling of your face and throat  . A fast heartbeat  . A bad rash all over your body  . Dizziness and weakness    Immunizations Administered    Name Date Dose VIS Date Route   Pfizer COVID-19 Vaccine 10/27/2019  9:12 AM 0.3 mL 09/01/2019 Intramuscular   Manufacturer: [place]   Lot: [id number]   [place]: [address]-7

## 2019-11-20 ENCOUNTER — Other Ambulatory Visit: Payer: Self-pay | Admitting: Family Medicine

## 2019-11-21 ENCOUNTER — Ambulatory Visit: Payer: PPO | Attending: Internal Medicine

## 2019-11-21 DIAGNOSIS — Z23 Encounter for immunization: Secondary | ICD-10-CM | POA: Insufficient documentation

## 2019-11-21 NOTE — Progress Notes (Signed)
   Covid-19 Vaccination Clinic  Name:  Blake Peterson    MRN: [id number] DOB: 12-24-1940  11/21/2019  [name] was observed post Covid-19 immunization for 15 minutes without incident. He was provided with Vaccine Information Sheet and instruction to access the V-Safe system.   [name] was instructed to call 911 with any severe reactions post vaccine: Marland Kitchen Difficulty breathing  . Swelling of face and throat  . A fast heartbeat  . A bad rash all over body  . Dizziness and weakness   Immunizations Administered    Name Date Dose VIS Date Route   Pfizer COVID-19 Vaccine 11/21/2019 10:12 AM 0.3 mL 09/01/2019 Intramuscular   Manufacturer: [place]   Lot: [id number]   [place]: [id number]

## 2019-12-01 ENCOUNTER — Other Ambulatory Visit: Payer: Self-pay | Admitting: Family Medicine

## 2019-12-04 DIAGNOSIS — M059 Rheumatoid arthritis with rheumatoid factor, unspecified: Secondary | ICD-10-CM | POA: Diagnosis not present

## 2019-12-18 DIAGNOSIS — M059 Rheumatoid arthritis with rheumatoid factor, unspecified: Secondary | ICD-10-CM | POA: Diagnosis not present

## 2020-01-02 DIAGNOSIS — Z8551 Personal history of malignant neoplasm of bladder: Secondary | ICD-10-CM | POA: Diagnosis not present

## 2020-01-05 DIAGNOSIS — Z79899 Other long term (current) drug therapy: Secondary | ICD-10-CM | POA: Diagnosis not present

## 2020-01-05 DIAGNOSIS — M059 Rheumatoid arthritis with rheumatoid factor, unspecified: Secondary | ICD-10-CM | POA: Diagnosis not present

## 2020-01-05 DIAGNOSIS — R768 Other specified abnormal immunological findings in serum: Secondary | ICD-10-CM | POA: Diagnosis not present

## 2020-01-15 DIAGNOSIS — M059 Rheumatoid arthritis with rheumatoid factor, unspecified: Secondary | ICD-10-CM | POA: Diagnosis not present

## 2020-02-26 ENCOUNTER — Other Ambulatory Visit: Payer: Self-pay | Admitting: Family Medicine

## 2020-02-26 DIAGNOSIS — H04123 Dry eye syndrome of bilateral lacrimal glands: Secondary | ICD-10-CM | POA: Diagnosis not present

## 2020-02-26 DIAGNOSIS — H0288B Meibomian gland dysfunction left eye, upper and lower eyelids: Secondary | ICD-10-CM | POA: Diagnosis not present

## 2020-02-26 DIAGNOSIS — H52223 Regular astigmatism, bilateral: Secondary | ICD-10-CM | POA: Diagnosis not present

## 2020-02-26 DIAGNOSIS — Z9842 Cataract extraction status, left eye: Secondary | ICD-10-CM | POA: Diagnosis not present

## 2020-02-26 DIAGNOSIS — Z9841 Cataract extraction status, right eye: Secondary | ICD-10-CM | POA: Diagnosis not present

## 2020-02-26 DIAGNOSIS — H0288A Meibomian gland dysfunction right eye, upper and lower eyelids: Secondary | ICD-10-CM | POA: Diagnosis not present

## 2020-02-26 DIAGNOSIS — H43822 Vitreomacular adhesion, left eye: Secondary | ICD-10-CM | POA: Diagnosis not present

## 2020-03-11 DIAGNOSIS — Z79899 Other long term (current) drug therapy: Secondary | ICD-10-CM | POA: Diagnosis not present

## 2020-03-11 DIAGNOSIS — R600 Localized edema: Secondary | ICD-10-CM | POA: Diagnosis not present

## 2020-03-11 DIAGNOSIS — M0630 Rheumatoid nodule, unspecified site: Secondary | ICD-10-CM | POA: Diagnosis not present

## 2020-03-11 DIAGNOSIS — M059 Rheumatoid arthritis with rheumatoid factor, unspecified: Secondary | ICD-10-CM | POA: Diagnosis not present

## 2020-03-11 DIAGNOSIS — R768 Other specified abnormal immunological findings in serum: Secondary | ICD-10-CM | POA: Diagnosis not present

## 2020-03-28 ENCOUNTER — Encounter: Payer: Self-pay | Admitting: Family Medicine

## 2020-03-28 ENCOUNTER — Ambulatory Visit (INDEPENDENT_AMBULATORY_CARE_PROVIDER_SITE_OTHER): Payer: PPO | Admitting: Family Medicine

## 2020-03-28 ENCOUNTER — Other Ambulatory Visit: Payer: Self-pay

## 2020-03-28 VITALS — BP 130/72 | HR 76 | Temp 97.7°F | Ht 67.5 in | Wt 199.1 lb

## 2020-03-28 DIAGNOSIS — E119 Type 2 diabetes mellitus without complications: Secondary | ICD-10-CM

## 2020-03-28 DIAGNOSIS — C679 Malignant neoplasm of bladder, unspecified: Secondary | ICD-10-CM

## 2020-03-28 DIAGNOSIS — Z7189 Other specified counseling: Secondary | ICD-10-CM | POA: Diagnosis not present

## 2020-03-28 DIAGNOSIS — M059 Rheumatoid arthritis with rheumatoid factor, unspecified: Secondary | ICD-10-CM | POA: Diagnosis not present

## 2020-03-28 LAB — POCT GLYCOSYLATED HEMOGLOBIN (HGB A1C): Hemoglobin A1C: 6.5 % — AB (ref 4.0–5.6)

## 2020-03-28 NOTE — Assessment & Plan Note (Signed)
Appreciate rheum care. Planning to establish with the new Rheumatologist at [facility]). Now on MTX.

## 2020-03-28 NOTE — Assessment & Plan Note (Signed)
Chronic, stable. Continue diet -control. Will request records of recent eye exam. Foot exam today.

## 2020-03-28 NOTE — Assessment & Plan Note (Signed)
Brings advanced directives which will be reviewed and scanned

## 2020-03-28 NOTE — Patient Instructions (Addendum)
Thank you for bringing advanced directive form.  We will request records from latest eye exam.  Continue following low sugar low carb diabetic diet to continue good sugar control. Sugar was in diet-controlled diabetes range today.  Return as needed or in 6 months for physical.

## 2020-03-28 NOTE — Assessment & Plan Note (Signed)
Appreciate urology care Blake Peterson) currently undergoing cystoscopy Q3 mo.

## 2020-03-28 NOTE — Progress Notes (Signed)
This visit was conducted in person.  BP 130/72 (BP Location: Left Arm, Patient Position: Sitting, Cuff Size: Normal)   Pulse 76   Temp 97.7 F (36.5 C) (Temporal)   Ht 5' 7.5" (1.715 m)   Wt 199 lb 1 oz (90.3 kg)   SpO2 94%   BMI 30.72 kg/m    CC: 6 mo f/u visit  Subjective:    Patient ID: Blake Peterson, male    DOB: 10-12-1940, 79 y.o.   MRN: [id number]  HPI: Blake Peterson is a 79 y.o. male presenting on 03/28/2020 for Follow-up (Here for 6 mo f/u.)   Sees rheum for RA on MTX 20mg  weekly. Takes tylenol for leg pains.  Papillary bladder cancer - sees Dr Gloriann Loan urology - upcoming appt mid July. S/p TURBT 05/2019, 06/2019.   DM - does not regularly check sugars. Compliant with antihyperglycemic regimen which includes: diet control - better since off prednisone actually in prediabetes range, off sulfonylurea. Denies low sugars or hypoglycemic symptoms. Denies paresthesias. Last diabetic eye exam 01/2020. Pneumovax: 2010. Prevnar: 2015. Glucometer brand: unsure. DSME: has not undergone. Lab Results  Component Value Date   HGBA1C 6.5 (A) 03/28/2020   Diabetic Foot Exam - Simple   Simple Foot Form Diabetic Foot exam was performed with the following findings: Yes 03/28/2020  9:25 AM  Visual Inspection No deformities, no ulcerations, no other skin breakdown bilaterally: Yes Sensation Testing Intact to touch and monofilament testing bilaterally: Yes Pulse Check Posterior Tibialis and Dorsalis pulse intact bilaterally: Yes Comments    Lab Results  Component Value Date   MICROALBUR 0.8 09/16/2018        Relevant past medical, surgical, family and social history reviewed and updated as indicated. Interim medical history since our last visit reviewed. Allergies and medications reviewed and updated. Outpatient Medications Prior to Visit  Medication Sig Dispense Refill  . acetaminophen (TYLENOL) 500 MG tablet Take 1 tablet (500 mg total) by mouth every 6 (six) hours as needed for  moderate pain.    Marland Kitchen atenolol (TENORMIN) 50 MG tablet TAKE 1 TABLET BY MOUTH EVERY DAY 90 tablet 3  . atorvastatin (LIPITOR) 80 MG tablet TAKE 1 TABLET BY MOUTH EVERYDAY AT BEDTIME 90 tablet 3  . chlorthalidone (HYGROTON) 25 MG tablet TAKE 1 TABLET BY MOUTH EVERY DAY 90 tablet 3  . Cholecalciferol (VITAMIN D-3) 125 MCG (5000 UT) TABS Take 5,000 Units by mouth daily.    . enalapril (VASOTEC) 10 MG tablet Take 1 tablet (10 mg total) by mouth 2 (two) times daily. 923 tablet 1  . folic acid (FOLVITE) 1 MG tablet Take 1 mg by mouth daily.    Marland Kitchen HYDROcodone-acetaminophen (NORCO/VICODIN) 5-325 MG tablet Take 1 tablet by mouth every 4 (four) hours as needed for moderate pain. 8 tablet 0  . KLOR-CON M10 10 MEQ tablet TAKE 1 TABLET BY MOUTH EVERY DAY 90 tablet 0  . methotrexate (RHEUMATREX) 2.5 MG tablet Take 20 mg by mouth once a week. Caution:Chemotherapy. Protect from light.    . Omega-3 Fatty Acids (FISH OIL PO) Take 300 mg by mouth daily.     . predniSONE (DELTASONE) 5 MG tablet Take 5 mg by mouth daily as needed (arthritis flare).     . tamsulosin (FLOMAX) 0.4 MG CAPS capsule Take 0.4 mg by mouth daily with lunch.     . vitamin B-12 (CYANOCOBALAMIN) 1000 MCG tablet Take 1,000 mcg by mouth daily.     No facility-administered medications prior to visit.  Per HPI unless specifically indicated in ROS section below Review of Systems Objective:  BP 130/72 (BP Location: Left Arm, Patient Position: Sitting, Cuff Size: Normal)   Pulse 76   Temp 97.7 F (36.5 C) (Temporal)   Ht 5' 7.5" (1.715 m)   Wt 199 lb 1 oz (90.3 kg)   SpO2 94%   BMI 30.72 kg/m   Wt Readings from Last 3 Encounters:  03/28/20 199 lb 1 oz (90.3 kg)  09/29/19 193 lb 4 oz (87.7 kg)  07/07/19 201 lb 6.4 oz (91.4 kg)      Physical Exam Vitals and nursing note reviewed.  Constitutional:      General: He is not in acute distress.    Appearance: Normal appearance. He is well-developed. He is not ill-appearing.  Eyes:      General: No scleral icterus.    Extraocular Movements: Extraocular movements intact.     Conjunctiva/sclera: Conjunctivae normal.     Pupils: Pupils are equal, round, and reactive to light.  Cardiovascular:     Rate and Rhythm: Normal rate and regular rhythm.     Pulses: Normal pulses.     Heart sounds: Normal heart sounds. No murmur heard.   Pulmonary:     Effort: Pulmonary effort is normal. No respiratory distress.     Breath sounds: Normal breath sounds. No wheezing, rhonchi or rales.  Musculoskeletal:     Cervical back: Normal range of motion and neck supple.     Right lower leg: No edema.     Left lower leg: No edema.     Comments: See HPI for foot exam if done  Lymphadenopathy:     Cervical: No cervical adenopathy.  Skin:    General: Skin is warm and dry.     Findings: No rash.  Neurological:     Mental Status: He is alert.  Psychiatric:        Mood and Affect: Mood normal.        Behavior: Behavior normal.       Results for orders placed or performed in visit on 03/28/20  POCT glycosylated hemoglobin (Hb A1C)  Result Value Ref Range   Hemoglobin A1C 6.5 (A) 4.0 - 5.6 %   HbA1c POC (<> result, manual entry)     HbA1c, POC (prediabetic range)     HbA1c, POC (controlled diabetic range)     Assessment & Plan:  This visit occurred during the SARS-CoV-2 public health emergency.  Safety protocols were in place, including screening questions prior to the visit, additional usage of staff PPE, and extensive cleaning of exam room while observing appropriate contact time as indicated for disinfecting solutions.   Problem List Items Addressed This Visit    Seropositive rheumatoid arthritis ([place])    Appreciate rheum care. Planning to establish with the new Rheumatologist at [facility]). Now on MTX.       Papillary adenocarcinoma of bladder [facility])    Appreciate urology care Gloriann Loan) currently undergoing cystoscopy Q3 mo.       Controlled type 2 diabetes mellitus  without complication, without long-term current use of insulin (HCC) - Primary    Chronic, stable. Continue diet -control. Will request records of recent eye exam. Foot exam today.       Relevant Orders   POCT glycosylated hemoglobin (Hb A1C) (Completed)   Advanced care planning/counseling discussion    Brings advanced directives which will be reviewed and scanned           No  orders of the defined types were placed in this encounter.  Orders Placed This Encounter  Procedures  . POCT glycosylated hemoglobin (Hb A1C)    Patient Instructions  Thank you for bringing advanced directive form.  We will request records from latest eye exam.  Continue following low sugar low carb diabetic diet to continue good sugar control. Sugar was in diet-controlled diabetes range today.  Return as needed or in 6 months for physical.    Follow up plan: Return in about 6 months (around 09/28/2020) for follow up visit.  Ria Bush, MD

## 2020-04-05 DIAGNOSIS — C674 Malignant neoplasm of posterior wall of bladder: Secondary | ICD-10-CM | POA: Diagnosis not present

## 2020-04-05 DIAGNOSIS — R8271 Bacteriuria: Secondary | ICD-10-CM | POA: Diagnosis not present

## 2020-04-16 ENCOUNTER — Other Ambulatory Visit: Payer: Self-pay | Admitting: Family Medicine

## 2020-05-08 DIAGNOSIS — M059 Rheumatoid arthritis with rheumatoid factor, unspecified: Secondary | ICD-10-CM | POA: Diagnosis not present

## 2020-05-17 ENCOUNTER — Other Ambulatory Visit: Payer: Self-pay | Admitting: Ophthalmology

## 2020-05-17 DIAGNOSIS — D414 Neoplasm of uncertain behavior of bladder: Secondary | ICD-10-CM | POA: Diagnosis not present

## 2020-05-17 DIAGNOSIS — D494 Neoplasm of unspecified behavior of bladder: Secondary | ICD-10-CM | POA: Diagnosis not present

## 2020-05-17 DIAGNOSIS — C679 Malignant neoplasm of bladder, unspecified: Secondary | ICD-10-CM | POA: Diagnosis not present

## 2020-05-21 ENCOUNTER — Other Ambulatory Visit: Payer: Self-pay | Admitting: Family Medicine

## 2020-05-24 ENCOUNTER — Telehealth: Payer: Self-pay | Admitting: Oncology

## 2020-05-24 DIAGNOSIS — C674 Malignant neoplasm of posterior wall of bladder: Secondary | ICD-10-CM | POA: Diagnosis not present

## 2020-05-24 DIAGNOSIS — R310 Gross hematuria: Secondary | ICD-10-CM | POA: Diagnosis not present

## 2020-05-24 DIAGNOSIS — R3914 Feeling of incomplete bladder emptying: Secondary | ICD-10-CM | POA: Diagnosis not present

## 2020-05-24 DIAGNOSIS — R3916 Straining to void: Secondary | ICD-10-CM | POA: Diagnosis not present

## 2020-05-24 DIAGNOSIS — R3912 Poor urinary stream: Secondary | ICD-10-CM | POA: Diagnosis not present

## 2020-05-24 NOTE — Telephone Encounter (Signed)
Received a new pt referral from Dr. Gloriann Loan at [facility] for bladder cancer. Blake Peterson has been cld and scheduled to see Dr. Alen Blew on 9/7 at 2pm. Pt aware to arrive 15 minutes early.

## 2020-05-24 NOTE — Telephone Encounter (Signed)
Mr. Torrez has been rescheduled to see Dr. Alen Blew to 9/15 at 2pm. New appt date and time has been given to the pt's wife.

## 2020-05-28 ENCOUNTER — Ambulatory Visit: Payer: PPO | Admitting: Oncology

## 2020-06-05 ENCOUNTER — Inpatient Hospital Stay: Payer: PPO | Attending: Oncology | Admitting: Oncology

## 2020-06-05 ENCOUNTER — Other Ambulatory Visit: Payer: Self-pay

## 2020-06-05 VITALS — BP 130/70 | HR 61 | Temp 96.9°F | Resp 18 | Ht 67.5 in | Wt 190.2 lb

## 2020-06-05 DIAGNOSIS — C679 Malignant neoplasm of bladder, unspecified: Secondary | ICD-10-CM | POA: Diagnosis not present

## 2020-06-05 NOTE — Progress Notes (Signed)
Reason for the request:    Bladder cancer  HPI: I was asked by Dr. Gloriann Loan to evaluate Mr. Falotico for bladder cancer.  He is a 79 year old man with history of hypertension who was found to have bladder cancer in September 2020.  At that time he presented with hematuria and a syncopal episode.  He underwent a TURBT under the care of Dr. Gloriann Loan and found to have T1 disease without any muscle invasion.  He did have resected the tumor in October 2020 and received BCG subsequent to that.  Repeat surveillance cystoscopy in July 2021 showed no evidence of any malignancy.  A repeat cystoscopy on August 27 which revealed an abnormal bladder tissue with posterior bladder wall tumor.  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  Clinically, he does report occasional constipation and diarrhea.  He does have history of rheumatoid arthritis and gets infliximab injections periodically.  He does report decline in his appetite as of late and his mobility is reasonable however.  He does able to drive and ambulate without any major difficulties.    He does not report any headaches, blurry vision, syncope or seizures. Does not report any fevers, chills or sweats.  Does not report any cough, wheezing or hemoptysis.  Does not report any chest pain, palpitation, orthopnea or leg edema.  Does not report any nausea, vomiting or abdominal pain.  Does not report any constipation or diarrhea.  Does not report any skeletal complaints.    Does not report frequency, urgency or hematuria.  Does not report any skin rashes or lesions. Does not report any heat or cold intolerance.  Does not report any lymphadenopathy or petechiae.  Does not report any anxiety or depression.  Remaining review of systems is negative.    Past Medical History:  Diagnosis Date  . Basal cell carcinoma 12/2016   R anterior tibia  . Cataract   . Diverticulosis of colon   . History of colonic polyps    . HLD (hyperlipidemia)   . HTN (hypertension)   . Hypertensive retinopathy of both eyes, grade 1    [name]  . 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 Pre-diabetes   . Prediabetes   . 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 CATARACT EXTRACTION, BILATERAL  06/2017  . COLONOSCOPY  11/2012   mild-mod diverticulosis, int hem, rec rpt 5 yrs Fuller Plan)  . COLONOSCOPY  01/2018   2 TA, mod diverticulosis, rpt 5 yrs Fuller Plan)  . POLYPECTOMY    . TRANSURETHRAL RESECTION OF BLADDER TUMOR N/A 06/12/2019   Procedure: TRANSURETHRAL RESECTION OF BLADDER TUMOR (TURBT) WITH GEMCITABINE IN PACU;  Surgeon: Lucas Mallow, MD;  Location: [facility];  Service: Urology;  Laterality: N/A;  . TRANSURETHRAL RESECTION OF BLADDER TUMOR N/A 07/14/2019   Procedure: TRANSURETHRAL RESECTION OF BLADDER TUMOR (TURBT);  Surgeon: Lucas Mallow, MD;  Location: [facility];  Service: Urology;  Laterality: N/A;  . UMBILICAL HERNIA REPAIR  01/07/04  :   Current Outpatient Medications:  .  acetaminophen (TYLENOL) 500 MG tablet, Take 1 tablet (500 mg total) by mouth every 6 (six) hours as needed for moderate pain., Disp: , Rfl:  .  atenolol (TENORMIN) 50 MG tablet, TAKE 1 TABLET BY MOUTH EVERY DAY, Disp: 90 tablet, Rfl: 3 .  atorvastatin (LIPITOR) 80 MG tablet, TAKE 1 TABLET BY MOUTH EVERYDAY AT BEDTIME, Disp:  90 tablet, Rfl: 3 .  chlorthalidone (HYGROTON) 25 MG tablet, TAKE 1 TABLET BY MOUTH EVERY DAY, Disp: 90 tablet, Rfl: 3 .  Cholecalciferol (VITAMIN D-3) 125 MCG (5000 UT) TABS, Take 5,000 Units by mouth daily., Disp: , Rfl:  .  enalapril (VASOTEC) 10 MG tablet, TAKE 1 TABLET BY MOUTH TWICE A DAY, Disp: 180 tablet, Rfl: 1 .  folic acid (FOLVITE) 1 MG tablet, Take 1 mg by mouth daily., Disp: , Rfl:  .  HYDROcodone-acetaminophen (NORCO/VICODIN) 5-325 MG tablet, Take 1 tablet by mouth every 4 (four) hours as needed for moderate pain., Disp: 8 tablet, Rfl: 0 .  KLOR-CON M10 10 MEQ  tablet, TAKE 1 TABLET BY MOUTH EVERY DAY, Disp: 90 tablet, Rfl: 1 .  methotrexate (RHEUMATREX) 2.5 MG tablet, Take 20 mg by mouth once a week. Caution:Chemotherapy. Protect from light., Disp: , Rfl:  .  Omega-3 Fatty Acids (FISH OIL PO), Take 300 mg by mouth daily. , Disp: , Rfl:  .  predniSONE (DELTASONE) 5 MG tablet, Take 5 mg by mouth daily as needed (arthritis flare). , Disp: , Rfl:  .  tamsulosin (FLOMAX) 0.4 MG CAPS capsule, Take 0.4 mg by mouth daily with lunch. , Disp: , Rfl:  .  vitamin B-12 (CYANOCOBALAMIN) 1000 MCG tablet, Take 1,000 mcg by mouth daily., Disp: , Rfl: :  Allergies  Allergen Reactions  . Naprosyn [Naproxen] Swelling    Fingers and ankles swelling  :  Family History  Problem Relation Age of Onset  . Hypertension Sister   . Colon cancer Sister 35  . Colon polyps Sister   . Heart attack Father 71  . Esophageal cancer Neg Hx   . Rectal cancer Neg Hx   . Stomach cancer Neg Hx   :  Social History   Socioeconomic History  . Marital status: Married    Spouse name: Not on file  . Number of children: 3  . Years of education: Not on file  . Highest education level: Not on file  Occupational History  . 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 Smoking status: Former Smoker    Quit date: 11/10/1984    Years since quitting: 35.5  . Smokeless tobacco: Never Used  Vaping Use  . Vaping Use: Never used  Substance and Sexual Activity  . Alcohol use: Not Currently    Alcohol/week: 0.0 standard drinks  . Drug use: No  . Sexual activity: Never  Other Topics Concern  . Not on file  Social History Narrative   R handed   Lives with wife in single story home   Occupation: retired, was Clinical biochemist   Activity: enoys golfing, tries walking 3x/wk   Diet: good water, fruits/vegetables daily      Tested at risk of OSA during hospitalization (06/2019)   Social Determinants of Health   Financial Resource Strain: Low Risk   . Difficulty of  Paying Living Expenses: Not hard at all  Food Insecurity: No Food Insecurity  . Worried About Charity fundraiser in the Last Year: Never true  . Ran Out of Food in the Last Year: Never true  Transportation Needs: No Transportation Needs  . Lack of Transportation (Medical): No  . Lack of Transportation (Non-Medical): No  Physical Activity: Inactive  . Days of Exercise per Week: 0 days  . Minutes of Exercise per Session: 0 min  Stress: No Stress Concern Present  . Feeling of Stress : Not at all  Social Connections:   .  Frequency of Communication with Friends and Family: Not on file  . Frequency of Social Gatherings with Friends and Family: Not on file  . Attends Religious Services: Not on file  . Active Member of Clubs or Organizations: Not on file  . Attends Archivist Meetings: Not on file  . Marital Status: Not on file  Intimate Partner Violence: Not At Risk  . Fear of Current or Ex-Partner: No  . Emotionally Abused: No  . Physically Abused: No  . Sexually Abused: No  :  Pertinent items are noted in HPI.  Exam: Blood pressure 130/70, pulse 61, temperature (!) 96.9 F (36.1 C), temperature source Tympanic, resp. rate 18, height 5' 7.5" (1.715 m), weight 190 lb 3.2 oz (86.3 kg), SpO2 100 %.   ECOG 1  General appearance: alert and cooperative appeared without distress. Head: atraumatic without any abnormalities. Eyes: conjunctivae/corneas clear. PERRL.  Sclera anicteric. Throat: lips, mucosa, and tongue normal; without oral thrush or ulcers. Resp: clear to auscultation bilaterally without rhonchi, wheezes or dullness to percussion. Cardio: regular rate and rhythm, S1, S2 normal, no murmur, click, rub or gallop GI: soft, non-tender; bowel sounds normal; no masses,  no organomegaly Skin: Skin color, texture, turgor normal. No rashes or lesions Lymph nodes: Cervical, supraclavicular, and axillary nodes normal. Neurologic: Grossly normal without any motor, sensory or  deep tendon reflexes. Musculoskeletal: No joint deformity or effusion.  CBC    Component Value Date/Time   WBC 12.3 (H) 09/26/2019 0800   RBC 4.56 09/26/2019 0800   HGB 14.3 09/26/2019 0800   HCT 43.1 09/26/2019 0800   PLT 314.0 09/26/2019 0800   MCV 94.4 09/26/2019 0800   MCH 31.0 07/07/2019 1144   MCHC 33.2 09/26/2019 0800   RDW 14.9 09/26/2019 0800   LYMPHSABS 2.5 09/26/2019 0800   MONOABS 1.1 (H) 09/26/2019 0800   EOSABS 0.1 09/26/2019 0800   BASOSABS 0.0 09/26/2019 0800     Chemistry      Component Value Date/Time   NA 135 09/26/2019 0800   K 3.9 09/26/2019 0800   CL 96 09/26/2019 0800   CO2 28 09/26/2019 0800   BUN 15 09/26/2019 0800   CREATININE 0.78 09/26/2019 0800      Component Value Date/Time   CALCIUM 10.2 09/26/2019 0800   ALKPHOS 89 09/26/2019 0800   AST 18 09/26/2019 0800   ALT 21 09/26/2019 0800   BILITOT 0.5 09/26/2019 0800       Assessment and Plan:   79 year old with  1.  Bladder cancer presented with hematuria in September 2020 and subsequently developed muscle invasive disease documented in August 2021.  The natural course of this disease and treatment options were reviewed at this time.  Definitive treatment with radical cystectomy would be is best option moving forward following neoadjuvant chemotherapy.  His performance status and overall health should be suitable for this approach.  He is scheduled for complete staging work-up next week and evaluation by Dr. Tresa Moore for possible radical cystectomy.  If his CT scan does not show any evidence of metastatic disease I recommended proceeding with neoadjuvant chemotherapy followed by radical cystectomy.  If his CT scan showed metastatic disease, palliative chemotherapy alone would be his best option.   The logistics and rationale for using chemotherapy was reviewed today in detail.  Complication associated with cisplatin and gemcitabine chemotherapy was discussed.  These complications include nausea,  vomiting, myelosuppression, fatigue, infusion related complications, renal insufficiency, neutropenia, neutropenic sepsis and rarely serious thrombosis, hospitalization and death.  The benefit would also if he has an excellent response to chemotherapy, curative surgical resection may be attempted.  The plan is to treat with gemcitabine and cisplatin on day 1, gemcitabine day 8 out of a 21-day cycle.  Anticipate needing 4-6 cycles of therapy  pending his staging evaluation.   He will let me know about his decision how to proceed after he completes his scans and meeting with Dr. Tresa Moore.     2.  IV access: Risks and benefits of using Port-A-Cath versus peripheral veins was discussed today.  Complication associated with Port-A-Cath insertion include bleeding, infection and thrombosis.  After discussing the risks and benefits, he would be agreeable to proceed once he decides on chemotherapy.   3.  Antiemetics: Prescription for Compazine will be available to him.   4.  Renal function surveillance:  Baseline kidney function is normal at this time will be monitored periodically.   5.  Goals of care:  Pending has scans, treatment is curative unless he has metastatic disease.   6.  Follow-up: Will be determined pending his complete staging and evaluation by Dr. Tresa Moore and willingness to proceed with chemotherapy.  60  minutes were dedicated to this visit. The time was spent on reviewing laboratory data, imaging studies, discussing treatment options, and answering questions regarding future plan.     A copy of this consult has been forwarded to the requesting physician.

## 2020-06-05 NOTE — Progress Notes (Signed)
START ON PATHWAY REGIMEN - Bladder     A cycle is every 21 days:     Gemcitabine      Cisplatin   **Always confirm dose/schedule in your pharmacy ordering system**  Patient Characteristics: Pre-Cystectomy or Nonsurgical Candidate (Clinical Staging), cT2-4a, cN0-1, M0, Cystectomy Eligible, Cisplatin-Based Chemotherapy Indicated (CrCl ? 50 mL/min and Minimal or No Symptoms) Therapeutic Status: Pre-Cystectomy or Nonsurgical Candidate (Clinical Staging) AJCC M Category: cM0 AJCC 8 Stage Grouping: II AJCC T Category: cT2 AJCC N Category: cN0 Intent of Therapy: Curative Intent, Discussed with Patient

## 2020-06-10 ENCOUNTER — Telehealth: Payer: Self-pay | Admitting: *Deleted

## 2020-06-10 NOTE — Telephone Encounter (Signed)
Received call from pt's wife asking about scheduling chemo education and chemo. Pt saw Dr. Alen Blew last week. Per Dr. Hazeline Junker note, pt is to have staging scans done with Dr. Tresa Moore and then f/u appt with Dr. Tresa Moore to review results of scan. Depending on those results pt may have surgery or do systemic chemo.  Wife states pt is having scans on Wednesday, 06/13/20 of this week and f/u appt with Dr. Tresa Moore on 06/18/20 Advised that Dr. Alen Blew would get in touch with them after that is done.  She voiced understanding

## 2020-06-12 DIAGNOSIS — I700 Atherosclerosis of aorta: Secondary | ICD-10-CM | POA: Diagnosis not present

## 2020-06-12 DIAGNOSIS — K8689 Other specified diseases of pancreas: Secondary | ICD-10-CM | POA: Diagnosis not present

## 2020-06-12 DIAGNOSIS — C674 Malignant neoplasm of posterior wall of bladder: Secondary | ICD-10-CM | POA: Diagnosis not present

## 2020-06-12 DIAGNOSIS — K5730 Diverticulosis of large intestine without perforation or abscess without bleeding: Secondary | ICD-10-CM | POA: Diagnosis not present

## 2020-06-12 DIAGNOSIS — Z8551 Personal history of malignant neoplasm of bladder: Secondary | ICD-10-CM | POA: Diagnosis not present

## 2020-06-12 DIAGNOSIS — I2510 Atherosclerotic heart disease of native coronary artery without angina pectoris: Secondary | ICD-10-CM | POA: Diagnosis not present

## 2020-06-12 DIAGNOSIS — N1330 Unspecified hydronephrosis: Secondary | ICD-10-CM | POA: Diagnosis not present

## 2020-06-15 ENCOUNTER — Ambulatory Visit (INDEPENDENT_AMBULATORY_CARE_PROVIDER_SITE_OTHER): Payer: PPO

## 2020-06-15 ENCOUNTER — Other Ambulatory Visit: Payer: Self-pay

## 2020-06-15 DIAGNOSIS — Z23 Encounter for immunization: Secondary | ICD-10-CM

## 2020-06-18 DIAGNOSIS — N130 Hydronephrosis with ureteropelvic junction obstruction: Secondary | ICD-10-CM | POA: Diagnosis not present

## 2020-06-18 DIAGNOSIS — C674 Malignant neoplasm of posterior wall of bladder: Secondary | ICD-10-CM | POA: Diagnosis not present

## 2020-06-18 NOTE — Telephone Encounter (Signed)
Called patient and let him know that Dr. Alen Blew would like to repeat labs. Patient is scheduled for labs on 06/19/20 at 10:15. Patient is aware of appointment time.

## 2020-06-18 NOTE — Telephone Encounter (Signed)
Please let him know that I would like to repeat his labs today or tomorrow to check his kidney function prior to scheduling chemotherapy.  I sent a message to scheduling.

## 2020-06-19 ENCOUNTER — Other Ambulatory Visit: Payer: Self-pay

## 2020-06-19 ENCOUNTER — Inpatient Hospital Stay: Payer: PPO

## 2020-06-19 DIAGNOSIS — C679 Malignant neoplasm of bladder, unspecified: Secondary | ICD-10-CM | POA: Diagnosis not present

## 2020-06-19 LAB — CBC WITH DIFFERENTIAL (CANCER CENTER ONLY)
Abs Immature Granulocytes: 0.11 10*3/uL — ABNORMAL HIGH (ref 0.00–0.07)
Basophils Absolute: 0.1 10*3/uL (ref 0.0–0.1)
Basophils Relative: 1 %
Eosinophils Absolute: 0.2 10*3/uL (ref 0.0–0.5)
Eosinophils Relative: 2 %
HCT: 40.6 % (ref 39.0–52.0)
Hemoglobin: 12.7 g/dL — ABNORMAL LOW (ref 13.0–17.0)
Immature Granulocytes: 1 %
Lymphocytes Relative: 17 %
Lymphs Abs: 1.8 10*3/uL (ref 0.7–4.0)
MCH: 29.7 pg (ref 26.0–34.0)
MCHC: 31.3 g/dL (ref 30.0–36.0)
MCV: 94.9 fL (ref 80.0–100.0)
Monocytes Absolute: 1.2 10*3/uL — ABNORMAL HIGH (ref 0.1–1.0)
Monocytes Relative: 12 %
Neutro Abs: 7.1 10*3/uL (ref 1.7–7.7)
Neutrophils Relative %: 67 %
Platelet Count: 289 10*3/uL (ref 150–400)
RBC: 4.28 MIL/uL (ref 4.22–5.81)
RDW: 14.1 % (ref 11.5–15.5)
WBC Count: 10.4 10*3/uL (ref 4.0–10.5)
nRBC: 0 % (ref 0.0–0.2)

## 2020-06-19 LAB — CMP (CANCER CENTER ONLY)
ALT: 24 U/L (ref 0–44)
AST: 20 U/L (ref 15–41)
Albumin: 3.2 g/dL — ABNORMAL LOW (ref 3.5–5.0)
Alkaline Phosphatase: 112 U/L (ref 38–126)
Anion gap: 9 (ref 5–15)
BUN: 34 mg/dL — ABNORMAL HIGH (ref 8–23)
CO2: 26 mmol/L (ref 22–32)
Calcium: 9.7 mg/dL (ref 8.9–10.3)
Chloride: 105 mmol/L (ref 98–111)
Creatinine: 2.34 mg/dL — ABNORMAL HIGH (ref 0.61–1.24)
GFR, Est AFR Am: 30 mL/min — ABNORMAL LOW (ref >60)
GFR, Estimated: 26 mL/min — ABNORMAL LOW (ref >60)
Glucose, Bld: 166 mg/dL — ABNORMAL HIGH (ref 70–99)
Potassium: 4.4 mmol/L (ref 3.5–5.1)
Sodium: 140 mmol/L (ref 135–145)
Total Bilirubin: 0.6 mg/dL (ref 0.3–1.2)
Total Protein: 8.1 g/dL (ref 6.5–8.1)

## 2020-06-25 ENCOUNTER — Telehealth: Payer: Self-pay | Admitting: Oncology

## 2020-06-25 NOTE — Telephone Encounter (Signed)
Release: [id number] Faxed to [facility] @ fax [phone]

## 2020-07-02 ENCOUNTER — Telehealth: Payer: Self-pay

## 2020-07-02 NOTE — Telephone Encounter (Signed)
-----   Message from Wyatt Portela, MD sent at 07/02/2020  4:31 PM EDT ----- Let him that I will call him tomorrow to discuss. Thanks ----- Message ----- From: Kennedy Bucker, LPN Sent: [id number]/25/6720   4:10 PM EDT To: Wyatt Portela, MD  Patient's wife called about patient having an upcoming appointment with you and when it would be scheduled. Please advise. Thank you.   Kim LPN

## 2020-07-02 NOTE — Telephone Encounter (Signed)
Called patient's wife back per Dr Alen Blew. Patient's wife verbalized understanding.

## 2020-07-03 ENCOUNTER — Other Ambulatory Visit: Payer: Self-pay | Admitting: Oncology

## 2020-07-03 NOTE — Progress Notes (Signed)
His case was discussed with Dr. Tresa Moore and reiterated with Mr. Frampton today over the phone.  Given his bilateral hydronephrosis and worsening renal failure it is difficult to administer platinum based chemotherapy upfront.  Dr. Tresa Moore agrees with proceeding with upfront radical cystectomy consider adjuvant chemotherapy subsequently.  Blake Peterson understands and agrees with this plan and we will schedule a follow-up after his surgery.  His follow-up will be tentatively in 3 months from.  All his questions were answered today.

## 2020-07-15 ENCOUNTER — Other Ambulatory Visit: Payer: Self-pay | Admitting: Urology

## 2020-07-18 DIAGNOSIS — C674 Malignant neoplasm of posterior wall of bladder: Secondary | ICD-10-CM | POA: Diagnosis not present

## 2020-07-18 DIAGNOSIS — N130 Hydronephrosis with ureteropelvic junction obstruction: Secondary | ICD-10-CM | POA: Diagnosis not present

## 2020-07-29 ENCOUNTER — Telehealth: Payer: Self-pay | Admitting: *Deleted

## 2020-07-29 NOTE — Telephone Encounter (Signed)
I have been following along with patient's course. I think ok to postpone annual until after recovered form upcoming bladder removal surgery.  Want him to focus on recovering at this time.

## 2020-07-29 NOTE — Telephone Encounter (Signed)
Patient's wife left a voicemail requesting a call back because she has questions. Called patient's wife back and got the answering machine.  Left message to call the office back.

## 2020-07-29 NOTE — Telephone Encounter (Signed)
Mrs Blake Peterson [facility] signed) left v/m requesting cb.  I spoke with Mrs Blake Peterson and pt is having problems with tumor in bladder and scheduled for removal of bladder 08/30/20; Mrs Blake Peterson wanted Dr Darnell Level to know. Pt is scheduled for annual labs and nurse health advisor visit on 09/27/19 and annual exam scheduled with DR G on 10/04/19. Mrs Blake Peterson said she is sure the hospital will be doing a lot of labs on pt prior to surgery and after surgery on 08/30/20 Mrs Blake Peterson is not sure how soon pt will be able to get out of his home. [name] wants to verify with Dr Darnell Level it is OK to postpone the annual labs and exams until later in the winter or spring. Mrs Blake Peterson request cb after note reviewed by Dr Darnell Level.

## 2020-07-29 NOTE — Telephone Encounter (Signed)
Spoke with pt relaying Dr. Synthia Innocent message.  Pt will r/s when ready.  C/x wellness, cpe and lab visits.

## 2020-08-21 ENCOUNTER — Other Ambulatory Visit (HOSPITAL_COMMUNITY): Payer: PPO

## 2020-08-29 ENCOUNTER — Inpatient Hospital Stay (HOSPITAL_COMMUNITY)
Admission: RE | Admit: 2020-08-29 | Discharge: 2020-09-01 | DRG: 657 | Disposition: A | Discharge status: Home / Self Care | Payer: PPO | Attending: Urology | Admitting: Urology

## 2020-08-29 ENCOUNTER — Other Ambulatory Visit: Payer: Self-pay | Admitting: Urology

## 2020-08-29 ENCOUNTER — Encounter (HOSPITAL_COMMUNITY): Payer: Self-pay | Admitting: Urology

## 2020-08-29 ENCOUNTER — Other Ambulatory Visit: Payer: Self-pay

## 2020-08-29 DIAGNOSIS — C675 Malignant neoplasm of bladder neck: Principal | ICD-10-CM | POA: Diagnosis present

## 2020-08-29 DIAGNOSIS — R7303 Prediabetes: Secondary | ICD-10-CM | POA: Diagnosis not present

## 2020-08-29 DIAGNOSIS — Z79899 Other long term (current) drug therapy: Secondary | ICD-10-CM

## 2020-08-29 DIAGNOSIS — Z974 Presence of external hearing-aid: Secondary | ICD-10-CM | POA: Diagnosis not present

## 2020-08-29 DIAGNOSIS — Z87891 Personal history of nicotine dependence: Secondary | ICD-10-CM | POA: Diagnosis not present

## 2020-08-29 DIAGNOSIS — N1330 Unspecified hydronephrosis: Secondary | ICD-10-CM | POA: Diagnosis not present

## 2020-08-29 DIAGNOSIS — Z886 Allergy status to analgesic agent status: Secondary | ICD-10-CM

## 2020-08-29 DIAGNOSIS — Z8249 Family history of ischemic heart disease and other diseases of the circulatory system: Secondary | ICD-10-CM | POA: Diagnosis not present

## 2020-08-29 DIAGNOSIS — C670 Malignant neoplasm of trigone of bladder: Secondary | ICD-10-CM | POA: Diagnosis present

## 2020-08-29 DIAGNOSIS — C775 Secondary and unspecified malignant neoplasm of intrapelvic lymph nodes: Secondary | ICD-10-CM | POA: Diagnosis present

## 2020-08-29 DIAGNOSIS — E785 Hyperlipidemia, unspecified: Secondary | ICD-10-CM | POA: Diagnosis not present

## 2020-08-29 DIAGNOSIS — Z8719 Personal history of other diseases of the digestive system: Secondary | ICD-10-CM

## 2020-08-29 DIAGNOSIS — Z85828 Personal history of other malignant neoplasm of skin: Secondary | ICD-10-CM | POA: Diagnosis not present

## 2020-08-29 DIAGNOSIS — C674 Malignant neoplasm of posterior wall of bladder: Secondary | ICD-10-CM | POA: Diagnosis not present

## 2020-08-29 DIAGNOSIS — Z20822 Contact with and (suspected) exposure to covid-19: Secondary | ICD-10-CM | POA: Diagnosis not present

## 2020-08-29 DIAGNOSIS — I10 Essential (primary) hypertension: Secondary | ICD-10-CM | POA: Diagnosis not present

## 2020-08-29 DIAGNOSIS — H35033 Hypertensive retinopathy, bilateral: Secondary | ICD-10-CM | POA: Diagnosis not present

## 2020-08-29 DIAGNOSIS — E11319 Type 2 diabetes mellitus with unspecified diabetic retinopathy without macular edema: Secondary | ICD-10-CM | POA: Diagnosis not present

## 2020-08-29 DIAGNOSIS — C679 Malignant neoplasm of bladder, unspecified: Secondary | ICD-10-CM | POA: Diagnosis present

## 2020-08-29 LAB — CBC
HCT: 37.9 % — ABNORMAL LOW (ref 39.0–52.0)
Hemoglobin: 12.3 g/dL — ABNORMAL LOW (ref 13.0–17.0)
MCH: 30.6 pg (ref 26.0–34.0)
MCHC: 32.5 g/dL (ref 30.0–36.0)
MCV: 94.3 fL (ref 80.0–100.0)
Platelets: 224 10*3/uL (ref 150–400)
RBC: 4.02 MIL/uL — ABNORMAL LOW (ref 4.22–5.81)
RDW: 14.2 % (ref 11.5–15.5)
WBC: 13.5 10*3/uL — ABNORMAL HIGH (ref 4.0–10.5)
nRBC: 0 % (ref 0.0–0.2)

## 2020-08-29 LAB — SURGICAL PCR SCREEN
MRSA, PCR: NEGATIVE
Staphylococcus aureus: NEGATIVE

## 2020-08-29 LAB — PREPARE RBC (CROSSMATCH)

## 2020-08-29 LAB — ABO/RH: ABO/RH(D): B POS

## 2020-08-29 MED ORDER — PIPERACILLIN-TAZOBACTAM 3.375 G IVPB 30 MIN
2.2500 g | INTRAVENOUS | Status: DC
  Filled 2020-08-29 (×3): qty 50

## 2020-08-29 MED ORDER — PEG 3350-KCL-NA BICARB-NACL 420 G PO SOLR
4000.0000 mL | Freq: Once | ORAL | Status: AC
  Administered 2020-08-29: 4000 mL via ORAL

## 2020-08-29 MED ORDER — ATORVASTATIN CALCIUM 40 MG PO TABS
80.0000 mg | ORAL_TABLET | Freq: Every day | ORAL | Status: DC
  Administered 2020-08-29 – 2020-08-31 (×3): 80 mg via ORAL
  Filled 2020-08-29 (×3): qty 2

## 2020-08-29 MED ORDER — SODIUM CHLORIDE 0.9% IV SOLUTION: Freq: Once | INTRAVENOUS | Status: DC

## 2020-08-29 MED ORDER — ALVIMOPAN 12 MG PO CAPS
12.0000 mg | ORAL_CAPSULE | ORAL | Status: AC
  Administered 2020-08-30: 12 mg via ORAL
  Filled 2020-08-29: qty 1

## 2020-08-29 MED ORDER — METRONIDAZOLE 500 MG PO TABS
500.0000 mg | ORAL_TABLET | ORAL | Status: AC
  Administered 2020-08-29 (×3): 500 mg via ORAL
  Filled 2020-08-29 (×2): qty 1

## 2020-08-29 MED ORDER — ATENOLOL 50 MG PO TABS
50.0000 mg | ORAL_TABLET | Freq: Every day | ORAL | Status: DC
  Administered 2020-08-29 – 2020-09-01 (×3): 50 mg via ORAL
  Filled 2020-08-29 (×3): qty 1

## 2020-08-29 MED ORDER — NEOMYCIN SULFATE 500 MG PO TABS
1000.0000 mg | ORAL_TABLET | ORAL | Status: AC
  Administered 2020-08-29 – 2020-08-30 (×3): 1000 mg via ORAL
  Filled 2020-08-29 (×3): qty 2

## 2020-08-29 MED ORDER — SODIUM CHLORIDE 0.9 % IV SOLN: INTRAVENOUS | Status: DC

## 2020-08-29 NOTE — Discharge Instructions (Signed)

## 2020-08-29 NOTE — Plan of Care (Signed)
[place] of rights and patient hand book provided. All patient belongings were placed within reach. Call bell in place. Pt will call for assistance when needing to get out of the bed.

## 2020-08-29 NOTE — Consult Note (Signed)
[facility] Nurse requested for preoperative stoma site marking  Discussed surgical procedure and stoma creation with patient and family.  Explained role of the [place] [facility] nurse team.  Provided the patient with educational booklet and provided samples of pouching options.  Answered patient and family questions.   Examined patien sitting, and standing in order to place the marking in the patient's visual field, away from any creases or abdominal contour issues and within the rectus muscle. Marked for ileal conduit in the RLQ _5___  cm to the right of the umbilicus and 0.5 ____ cm above the umbilicus.   Patient's abdomen cleansed with CHG wipes at site markings, allowed to air dry prior to marking.Covered mark with thin film transparent dressing to preserve mark until date of surgery.   [facility] Nurse team will follow up with patient after surgery for continue ostomy care and teaching.  [place] MSN, [place], [place], [place]

## 2020-08-29 NOTE — H&P (Signed)
Blake Peterson is an 79 y.o. male.    Chief Complaint: Pre-OP Cystectomy  HPI:    1 - Stage 3/4 High Grade Bladder Cancer - muscle-invasive high grade cancer by TURBT 04/2020, UO's obliterated by trigone / bladder neck primary mass. Cr 2.0. Staging CT chest/abd/pelvis with some non-bulky (<1cm) left pelvic and peri-aortic nodes. Fat planes preserved by imaging 05/2020. H/o superficial high grade dissease prior s/p BCG.   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 Cr 2.0, prior baseline <1.   PMH sig for umbilical hernia repair with mesh, moderate obesity. NO ischemic CV disease / blood thinners. 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 His PCP is Ria Bush MD.   Today " Blake Peterson " is seen ifor pre-op adkmission prior to major extirpative surgery for bladder cancer tomorrow. No interva fevers.     Past Medical History:  Diagnosis Date  . Basal cell carcinoma 12/2016   R anterior tibia  . Cataract   . Diverticulosis of colon   . History of colonic polyps   . HLD (hyperlipidemia)   . HTN (hypertension)   . Hypertensive retinopathy of both eyes, grade 1    [name]  . 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 Pre-diabetes   . Prediabetes   . 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 CATARACT EXTRACTION, BILATERAL  06/2017  . COLONOSCOPY  11/2012   mild-mod diverticulosis, int hem, rec rpt 5 yrs Fuller Plan)  . COLONOSCOPY  01/2018   2 TA, mod diverticulosis, rpt 5 yrs Fuller Plan)  . POLYPECTOMY    . TRANSURETHRAL RESECTION OF BLADDER TUMOR N/A 06/12/2019   Procedure: TRANSURETHRAL RESECTION OF BLADDER TUMOR (TURBT) WITH GEMCITABINE IN PACU;  Surgeon: Lucas Mallow, MD;  Location: [facility];  Service: Urology;  Laterality: N/A;  . TRANSURETHRAL RESECTION OF BLADDER TUMOR N/A 07/14/2019   Procedure: TRANSURETHRAL RESECTION OF BLADDER TUMOR (TURBT);   Surgeon: Lucas Mallow, MD;  Location: [facility];  Service: Urology;  Laterality: N/A;  . UMBILICAL HERNIA REPAIR  01/07/04    Family History  Problem Relation Age of Onset  . Hypertension Sister   . Colon cancer Sister 18  . Colon polyps Sister   . Heart attack Father 5  . Esophageal cancer Neg Hx   . Rectal cancer Neg Hx   . Stomach cancer Neg Hx    Social History:  reports that he quit smoking about 35 years ago. He has never used smokeless tobacco. He reports previous alcohol use. He reports that he does not use drugs.  Allergies:  Allergies  Allergen Reactions  . Naprosyn [Naproxen] Swelling    Fingers and ankles swelling    Medications Prior to Admission  Medication Sig Dispense Refill  . acetaminophen (TYLENOL) 500 MG tablet Take 1 tablet (500 mg total) by mouth every 6 (six) hours as needed for moderate pain.    Marland Kitchen atenolol (TENORMIN) 50 MG tablet TAKE 1 TABLET BY MOUTH EVERY DAY (Patient taking differently: Take 50 mg by mouth daily. ) 90 tablet 3  . atorvastatin (LIPITOR) 80 MG tablet TAKE 1 TABLET BY MOUTH EVERYDAY AT BEDTIME (Patient taking differently: Take 80 mg by mouth at bedtime. ) 90 tablet 3  . chlorthalidone (HYGROTON) 25 MG tablet TAKE 1 TABLET BY MOUTH EVERY DAY (Patient taking differently: Take 25 mg by mouth  daily. ) 90 tablet 3  . Cholecalciferol (VITAMIN D-3 PO) Take 50 mcg by mouth daily.     . enalapril (VASOTEC) 10 MG tablet TAKE 1 TABLET BY MOUTH TWICE A DAY (Patient taking differently: Take 10 mg by mouth 2 (two) times daily. ) 196 tablet 1  . folic acid (FOLVITE) 1 MG tablet Take 1 mg by mouth daily.    Marland Kitchen KLOR-CON M10 10 MEQ tablet TAKE 1 TABLET BY MOUTH EVERY DAY (Patient taking differently: Take 10 mEq by mouth daily. ) 90 tablet 1  . methotrexate (RHEUMATREX) 2.5 MG tablet Take 20 mg by mouth once a week. Caution:Chemotherapy. Protect from light. Thursday    . Omega-3 Fatty Acids (FISH OIL PO) Take 300 mg by mouth daily.     . predniSONE  (DELTASONE) 5 MG tablet Take 5 mg by mouth daily as needed (arthritis flare).     . tamsulosin (FLOMAX) 0.4 MG CAPS capsule Take 0.4 mg by mouth at bedtime as needed (Urinartion problem). after supper    . vitamin B-12 (CYANOCOBALAMIN) 1000 MCG tablet Take 1,000 mcg by mouth daily.      Results for orders placed or performed during the hospital encounter of 08/29/20 (from the past 48 hour(s))  CBC     Status: Abnormal   Collection Time: 08/29/20 10:32 AM  Result Value Ref Range   WBC 13.5 (H) 4.0 - 10.5 K/uL   RBC 4.02 (L) 4.22 - 5.81 MIL/uL   Hemoglobin 12.3 (L) 13.0 - 17.0 g/dL   HCT 37.9 (L) 39.0 - 52.0 %   MCV 94.3 80.0 - 100.0 fL   MCH 30.6 26.0 - 34.0 pg   MCHC 32.5 30.0 - 36.0 g/dL   RDW 14.2 11.5 - 15.5 %   Platelets 224 150 - 400 K/uL   nRBC 0.0 0.0 - 0.2 %    Comment: Performed at [facility], [place] [address] [place], [place] [address]   No results found.  Review of Systems  Constitutional: Negative for chills and fever.  All other systems reviewed and are negative.   Blood pressure 122/72, pulse 61, temperature 97.6 F (36.4 C), resp. rate 19, SpO2 100 %. Physical Exam Vitals reviewed.  Constitutional:      Comments: In good spirits. Family at bedside.   HENT:     Head: Normocephalic.     Nose: Nose normal.     Mouth/Throat:     Mouth: Mucous membranes are moist.  Eyes:     Pupils: Pupils are equal, round, and reactive to light.  Cardiovascular:     Rate and Rhythm: Normal rate.  Pulmonary:     Effort: Pulmonary effort is normal.  Abdominal:     General: Abdomen is flat.     Comments: Stable mild truncal obesity. Stomal marking site noted.   Genitourinary:    Comments: No CVAT.  Musculoskeletal:        General: Normal range of motion.     Cervical back: Normal range of motion.  Skin:    General: Skin is warm.  Neurological:     General: No focal deficit present.     Mental Status: He is alert.  Psychiatric:        Mood and  Affect: Mood normal.      Assessment/Plan  Bowel prep, stomal marking, labs, entereg, clears, 1/2 IVF, NPO p MN in prepaaration for radical cystectomy and conduit diversion tomorrow. Risks (including non-cure and mortality), benefits, alternatives, expected peri-op course (approx  7 dya hospital stay even if things very smooth, home with home health) discussed previously and reiterated otday to pt and family.   Alexis Frock, MD 08/29/2020, 1:29 PM

## 2020-08-30 ENCOUNTER — Encounter (HOSPITAL_COMMUNITY)
Admission: RE | Disposition: A | Discharge status: Home / Self Care | Payer: Self-pay | Source: Home / Self Care | Attending: Urology

## 2020-08-30 ENCOUNTER — Encounter (HOSPITAL_COMMUNITY): Payer: Self-pay | Admitting: Urology

## 2020-08-30 ENCOUNTER — Inpatient Hospital Stay (HOSPITAL_COMMUNITY): Payer: PPO | Admitting: Anesthesiology

## 2020-08-30 DIAGNOSIS — C679 Malignant neoplasm of bladder, unspecified: Secondary | ICD-10-CM | POA: Diagnosis present

## 2020-08-30 HISTORY — PX: CYSTOSCOPY WITH INJECTION: SHX1424

## 2020-08-30 HISTORY — PX: LAPAROSCOPY: SHX197

## 2020-08-30 LAB — COMPREHENSIVE METABOLIC PANEL
ALT: 18 U/L (ref 0–44)
AST: 29 U/L (ref 15–41)
Albumin: 3.4 g/dL — ABNORMAL LOW (ref 3.5–5.0)
Alkaline Phosphatase: 76 U/L (ref 38–126)
Anion gap: 7 (ref 5–15)
BUN: 21 mg/dL (ref 8–23)
CO2: 20 mmol/L — ABNORMAL LOW (ref 22–32)
Calcium: 8.9 mg/dL (ref 8.9–10.3)
Chloride: 105 mmol/L (ref 98–111)
Creatinine, Ser: 1.1 mg/dL (ref 0.61–1.24)
GFR, Estimated: >60 mL/min (ref >60)
Glucose, Bld: 144 mg/dL — ABNORMAL HIGH (ref 70–99)
Potassium: 5 mmol/L (ref 3.5–5.1)
Sodium: 132 mmol/L — ABNORMAL LOW (ref 135–145)
Total Bilirubin: 1.1 mg/dL (ref 0.3–1.2)
Total Protein: 6.5 g/dL (ref 6.5–8.1)

## 2020-08-30 LAB — HEMOGLOBIN AND HEMATOCRIT, BLOOD
HCT: 39.3 % (ref 39.0–52.0)
Hemoglobin: 12.6 g/dL — ABNORMAL LOW (ref 13.0–17.0)

## 2020-08-30 LAB — SARS CORONAVIRUS 2 (TAT 6-24 HRS): SARS Coronavirus 2: NEGATIVE

## 2020-08-30 SURGERY — CYSTOSCOPY, WITH INJECTION OF BLADDER NECK OR BLADDER WALL
Anesthesia: General | Site: Abdomen

## 2020-08-30 MED ORDER — CHLORHEXIDINE GLUCONATE 0.12 % MT SOLN
15.0000 mL | OROMUCOSAL | Status: AC
  Administered 2020-08-30: 15 mL via OROMUCOSAL

## 2020-08-30 MED ORDER — ATROPINE SULFATE 0.4 MG/ML IV SOSY
PREFILLED_SYRINGE | INTRAVENOUS | Status: DC | PRN
  Administered 2020-08-30: .4 mg via INTRAVENOUS

## 2020-08-30 MED ORDER — SODIUM CHLORIDE (PF) 0.9 % IJ SOLN
INTRAMUSCULAR | Status: DC | PRN
  Administered 2020-08-30: 20 mL

## 2020-08-30 MED ORDER — BACITRACIN-NEOMYCIN-POLYMYXIN 400-5-5000 EX OINT: 1.0000 "application " | TOPICAL_OINTMENT | Freq: Three times a day (TID) | CUTANEOUS | Status: DC | PRN

## 2020-08-30 MED ORDER — LIDOCAINE HCL (PF) 2 % IJ SOLN
INTRAMUSCULAR | Status: AC
  Filled 2020-08-30: qty 5

## 2020-08-30 MED ORDER — LACTATED RINGERS IV SOLN
INTRAVENOUS | Status: DC | PRN
Start: 2020-08-30 — End: 2020-08-30

## 2020-08-30 MED ORDER — DEXAMETHASONE SODIUM PHOSPHATE 10 MG/ML IJ SOLN
INTRAMUSCULAR | Status: AC
  Filled 2020-08-30: qty 1

## 2020-08-30 MED ORDER — ROCURONIUM BROMIDE 10 MG/ML (PF) SYRINGE
PREFILLED_SYRINGE | INTRAVENOUS | Status: AC
  Filled 2020-08-30: qty 10

## 2020-08-30 MED ORDER — PROPOFOL 10 MG/ML IV BOLUS
INTRAVENOUS | Status: AC
  Filled 2020-08-30: qty 20

## 2020-08-30 MED ORDER — MIDAZOLAM HCL 2 MG/2ML IJ SOLN
INTRAMUSCULAR | Status: AC
  Filled 2020-08-30: qty 2

## 2020-08-30 MED ORDER — SODIUM CHLORIDE (PF) 0.9 % IJ SOLN
INTRAMUSCULAR | Status: AC
  Filled 2020-08-30: qty 10

## 2020-08-30 MED ORDER — LACTATED RINGERS IR SOLN
Status: DC | PRN
  Administered 2020-08-30: 1000 mL

## 2020-08-30 MED ORDER — BELLADONNA ALKALOIDS-OPIUM 16.2-60 MG RE SUPP
1.0000 | Freq: Four times a day (QID) | RECTAL | Status: DC | PRN
Start: 2020-08-30 — End: 2020-09-01

## 2020-08-30 MED ORDER — WATER FOR IRRIGATION, STERILE IR SOLN
Status: DC | PRN
  Administered 2020-08-30: 1000 mL

## 2020-08-30 MED ORDER — DIPHENHYDRAMINE HCL 12.5 MG/5ML PO ELIX: 12.5000 mg | ORAL_SOLUTION | Freq: Four times a day (QID) | ORAL | Status: DC | PRN

## 2020-08-30 MED ORDER — HYDROCODONE-ACETAMINOPHEN 5-325 MG PO TABS: 1.0000 | ORAL_TABLET | Freq: Four times a day (QID) | ORAL | 0 refills | Status: DC | PRN

## 2020-08-30 MED ORDER — SUGAMMADEX SODIUM 200 MG/2ML IV SOLN
INTRAVENOUS | Status: DC | PRN
  Administered 2020-08-30: 200 mg via INTRAVENOUS

## 2020-08-30 MED ORDER — DOCUSATE SODIUM 100 MG PO CAPS
100.0000 mg | ORAL_CAPSULE | Freq: Two times a day (BID) | ORAL | Status: DC
  Administered 2020-08-30 – 2020-09-01 (×4): 100 mg via ORAL
  Filled 2020-08-30 (×4): qty 1

## 2020-08-30 MED ORDER — OXYCODONE HCL 5 MG PO TABS
5.0000 mg | ORAL_TABLET | ORAL | Status: DC | PRN
  Administered 2020-08-30 – 2020-09-01 (×8): 5 mg via ORAL
  Filled 2020-08-30 (×8): qty 1

## 2020-08-30 MED ORDER — SUFENTANIL CITRATE 50 MCG/ML IV SOLN
INTRAVENOUS | Status: DC | PRN
  Administered 2020-08-30 (×2): 5 ug via INTRAVENOUS
  Administered 2020-08-30: 15 ug via INTRAVENOUS
  Administered 2020-08-30: 10 ug via INTRAVENOUS
  Administered 2020-08-30: 5 ug via INTRAVENOUS

## 2020-08-30 MED ORDER — PROPOFOL 10 MG/ML IV BOLUS
INTRAVENOUS | Status: DC | PRN
Start: 2020-08-30 — End: 2020-08-30
  Administered 2020-08-30: 130 mg via INTRAVENOUS

## 2020-08-30 MED ORDER — ROCURONIUM BROMIDE 10 MG/ML (PF) SYRINGE
PREFILLED_SYRINGE | INTRAVENOUS | Status: DC | PRN
  Administered 2020-08-30: 70 mg via INTRAVENOUS

## 2020-08-30 MED ORDER — LACTATED RINGERS IV SOLN: Freq: Once | INTRAVENOUS | Status: AC

## 2020-08-30 MED ORDER — PIPERACILLIN-TAZOBACTAM 3.375 G IVPB
3.3750 g | Freq: Once | INTRAVENOUS | Status: AC
  Administered 2020-08-30: 3.375 g via INTRAVENOUS

## 2020-08-30 MED ORDER — HEPARIN SODIUM (PORCINE) 5000 UNIT/ML IJ SOLN
5000.0000 [IU] | Freq: Three times a day (TID) | INTRAMUSCULAR | Status: DC
  Administered 2020-08-30 – 2020-09-01 (×6): 5000 [IU] via SUBCUTANEOUS
  Filled 2020-08-30 (×6): qty 1

## 2020-08-30 MED ORDER — LIDOCAINE 2% (20 MG/ML) 5 ML SYRINGE
INTRAMUSCULAR | Status: DC | PRN
  Administered 2020-08-30: 1 mg/kg/h via INTRAVENOUS

## 2020-08-30 MED ORDER — SODIUM CHLORIDE 0.9 % IR SOLN
Status: DC | PRN
  Administered 2020-08-30: 3000 mL

## 2020-08-30 MED ORDER — ONDANSETRON HCL 4 MG/2ML IJ SOLN
INTRAMUSCULAR | Status: AC
  Filled 2020-08-30: qty 2

## 2020-08-30 MED ORDER — SODIUM CHLORIDE (PF) 0.9 % IJ SOLN
INTRAMUSCULAR | Status: AC
  Filled 2020-08-30: qty 20

## 2020-08-30 MED ORDER — ACETAMINOPHEN 500 MG PO TABS
1000.0000 mg | ORAL_TABLET | Freq: Four times a day (QID) | ORAL | Status: AC
  Administered 2020-08-30 – 2020-08-31 (×4): 1000 mg via ORAL
  Filled 2020-08-30 (×4): qty 2

## 2020-08-30 MED ORDER — CHLORHEXIDINE GLUCONATE CLOTH 2 % EX PADS
6.0000 | MEDICATED_PAD | Freq: Every day | CUTANEOUS | Status: DC
  Administered 2020-08-31: 08:00:00 6 via TOPICAL

## 2020-08-30 MED ORDER — HYDROMORPHONE HCL 1 MG/ML IJ SOLN
0.2500 mg | INTRAMUSCULAR | Status: DC | PRN
  Administered 2020-08-30 (×3): 0.5 mg via INTRAVENOUS

## 2020-08-30 MED ORDER — DIPHENHYDRAMINE HCL 50 MG/ML IJ SOLN: 12.5000 mg | Freq: Four times a day (QID) | INTRAMUSCULAR | Status: DC | PRN

## 2020-08-30 MED ORDER — ALBUMIN HUMAN 5 % IV SOLN
INTRAVENOUS | Status: DC | PRN
Start: 2020-08-30 — End: 2020-08-30

## 2020-08-30 MED ORDER — HYDROMORPHONE HCL 1 MG/ML IJ SOLN
INTRAMUSCULAR | Status: AC
  Filled 2020-08-30: qty 2

## 2020-08-30 MED ORDER — MIDAZOLAM HCL 2 MG/2ML IJ SOLN
INTRAMUSCULAR | Status: DC | PRN
  Administered 2020-08-30 (×2): 1 mg via INTRAVENOUS

## 2020-08-30 MED ORDER — ONDANSETRON HCL 4 MG/2ML IJ SOLN: 4.0000 mg | INTRAMUSCULAR | Status: DC | PRN

## 2020-08-30 MED ORDER — LIDOCAINE 2% (20 MG/ML) 5 ML SYRINGE
INTRAMUSCULAR | Status: DC | PRN
  Administered 2020-08-30: 60 mg via INTRAVENOUS

## 2020-08-30 MED ORDER — PHENYLEPHRINE HCL-NACL 10-0.9 MG/250ML-% IV SOLN
INTRAVENOUS | Status: DC | PRN
Start: 2020-08-30 — End: 2020-08-30
  Administered 2020-08-30: 50 ug/min via INTRAVENOUS

## 2020-08-30 MED ORDER — BUPIVACAINE LIPOSOME 1.3 % IJ SUSP
20.0000 mL | Freq: Once | INTRAMUSCULAR | Status: DC
  Filled 2020-08-30: qty 20

## 2020-08-30 MED ORDER — PHENYLEPHRINE HCL (PRESSORS) 10 MG/ML IV SOLN
INTRAVENOUS | Status: AC
  Filled 2020-08-30: qty 1

## 2020-08-30 MED ORDER — DEXTROSE-NACL 5-0.45 % IV SOLN
INTRAVENOUS | Status: DC
  Administered 2020-08-30: 1000 mL via INTRAVENOUS

## 2020-08-30 MED ORDER — SUFENTANIL CITRATE 50 MCG/ML IV SOLN
INTRAVENOUS | Status: AC
  Filled 2020-08-30: qty 1

## 2020-08-30 MED ORDER — ALBUMIN HUMAN 5 % IV SOLN
INTRAVENOUS | Status: AC
  Filled 2020-08-30: qty 250

## 2020-08-30 MED ORDER — HYDROMORPHONE HCL 1 MG/ML IJ SOLN
0.5000 mg | INTRAMUSCULAR | Status: DC | PRN
  Administered 2020-08-30: 0.5 mg via INTRAVENOUS
  Filled 2020-08-30: qty 1

## 2020-08-30 MED ORDER — ONDANSETRON HCL 4 MG/2ML IJ SOLN
INTRAMUSCULAR | Status: DC | PRN
  Administered 2020-08-30: 4 mg via INTRAVENOUS

## 2020-08-30 MED ORDER — DEXAMETHASONE SODIUM PHOSPHATE 10 MG/ML IJ SOLN
INTRAMUSCULAR | Status: DC | PRN
  Administered 2020-08-30: 10 mg via INTRAVENOUS

## 2020-08-30 MED ORDER — BUPIVACAINE LIPOSOME 1.3 % IJ SUSP
INTRAMUSCULAR | Status: DC | PRN
  Administered 2020-08-30: 20 mL

## 2020-08-30 SURGICAL SUPPLY — 117 items
APPLICATOR COTTON TIP 6 STRL (MISCELLANEOUS) ×3 IMPLANT
APPLICATOR COTTON TIP 6IN STRL (MISCELLANEOUS) ×5
APPLICATOR SURGIFLO ENDO (HEMOSTASIS) IMPLANT
BAG LAPAROSCOPIC 12 15 PORT 16 (BASKET) ×3 IMPLANT
BAG RETRIEVAL 12/15 (BASKET) ×4
BAG RETRIEVAL 12/15MM (BASKET) ×1
BAG URO CATCHER STRL LF (MISCELLANEOUS) IMPLANT
BENZOIN TINCTURE PRP APPL 2/3 (GAUZE/BANDAGES/DRESSINGS) IMPLANT
BLADE SURG SZ10 CARB STEEL (BLADE) IMPLANT
CATH FOLEY 2WAY SLVR 18FR 30CC (CATHETERS) ×5 IMPLANT
CATH SILICONE 5CC 18FR (INSTRUMENTS) ×5 IMPLANT
CATH TIEMANN FOLEY 18FR 5CC (CATHETERS) ×5 IMPLANT
CELLS DAT CNTRL 66122 CELL SVR (MISCELLANEOUS) ×3 IMPLANT
CHLORAPREP W/TINT 26 (MISCELLANEOUS) ×5 IMPLANT
CLIP VESOLOCK LG 6/CT PURPLE (CLIP) ×10 IMPLANT
CLIP VESOLOCK MED LG 6/CT (CLIP) ×5 IMPLANT
CLIP VESOLOCK XL 6/CT (CLIP) ×10 IMPLANT
CLOTH BEACON ORANGE TIMEOUT ST (SAFETY) ×5 IMPLANT
CNTNR URN SCR LID CUP LEK RST (MISCELLANEOUS) ×3 IMPLANT
CONT SPEC 4OZ STRL OR WHT (MISCELLANEOUS) ×5
COVER SURGICAL LIGHT HANDLE (MISCELLANEOUS) ×5 IMPLANT
COVER TIP SHEARS 8 DVNC (MISCELLANEOUS) ×3 IMPLANT
COVER TIP SHEARS 8MM DA VINCI (MISCELLANEOUS) ×5
COVER WAND RF STERILE (DRAPES) IMPLANT
CUTTER ECHEON FLEX ENDO 45 340 (ENDOMECHANICALS) ×5 IMPLANT
DECANTER SPIKE VIAL GLASS SM (MISCELLANEOUS) IMPLANT
DERMABOND ADVANCED (GAUZE/BANDAGES/DRESSINGS) ×4
DERMABOND ADVANCED .7 DNX12 (GAUZE/BANDAGES/DRESSINGS) ×6 IMPLANT
DRAIN CHANNEL RND F F (WOUND CARE) IMPLANT
DRAIN PENROSE 0.5X18 (DRAIN) IMPLANT
DRAPE ARM DVNC X/XI (DISPOSABLE) ×12 IMPLANT
DRAPE COLUMN DVNC XI (DISPOSABLE) ×3 IMPLANT
DRAPE DA VINCI XI ARM (DISPOSABLE) ×20
DRAPE DA VINCI XI COLUMN (DISPOSABLE) ×5
DRAPE SURG IRRIG POUCH 19X23 (DRAPES) ×5 IMPLANT
DRSG TEGADERM 4X4.75 (GAUZE/BANDAGES/DRESSINGS) ×5 IMPLANT
ELECT REM PT RETURN 15FT ADLT (MISCELLANEOUS) ×5 IMPLANT
GAUZE 4X4 16PLY RFD (DISPOSABLE) IMPLANT
GAUZE SPONGE 2X2 8PLY STRL LF (GAUZE/BANDAGES/DRESSINGS) IMPLANT
GLOVE BIO SURGEON STRL SZ 6.5 (GLOVE) ×8 IMPLANT
GLOVE BIO SURGEONS STRL SZ 6.5 (GLOVE) ×2
GLOVE BIOGEL M STRL SZ7.5 (GLOVE) ×15 IMPLANT
GLOVE BIOGEL PI IND STRL 7.5 (GLOVE) ×3 IMPLANT
GLOVE BIOGEL PI INDICATOR 7.5 (GLOVE) ×2
GOWN STRL REUS W/TWL LRG LVL3 (GOWN DISPOSABLE) ×25 IMPLANT
HOLDER FOLEY CATH W/STRAP (MISCELLANEOUS) ×5 IMPLANT
IRRIG SUCT STRYKERFLOW 2 WTIP (MISCELLANEOUS) ×5
IRRIGATION SUCT STRKRFLW 2 WTP (MISCELLANEOUS) ×3 IMPLANT
IV LACTATED RINGERS 1000ML (IV SOLUTION) ×5 IMPLANT
KIT PROCEDURE DA VINCI SI (MISCELLANEOUS) ×5
KIT PROCEDURE DVNC SI (MISCELLANEOUS) ×3 IMPLANT
KIT TURNOVER KIT A (KITS) IMPLANT
LOOP VESSEL MAXI BLUE (MISCELLANEOUS) ×5 IMPLANT
MANIFOLD NEPTUNE II (INSTRUMENTS) ×5 IMPLANT
NEEDLE ASPIRATION 22 (NEEDLE) ×5 IMPLANT
NEEDLE INSUFFLATION 14GA 120MM (NEEDLE) ×5 IMPLANT
NEEDLE SPNL 22GX7 QUINCKE BK (NEEDLE) ×5 IMPLANT
PACK CYSTO (CUSTOM PROCEDURE TRAY) ×5 IMPLANT
PACK ROBOT UROLOGY CUSTOM (CUSTOM PROCEDURE TRAY) ×5 IMPLANT
PAD POSITIONING PINK XL (MISCELLANEOUS) ×5 IMPLANT
PENCIL SMOKE EVACUATOR (MISCELLANEOUS) IMPLANT
PORT ACCESS TROCAR AIRSEAL 12 (TROCAR) ×3 IMPLANT
PORT ACCESS TROCAR AIRSEAL 5M (TROCAR) ×2
RELOAD STAPLER GREEN 60MM (STAPLE) ×9 IMPLANT
RELOAD STAPLER WHITE 60MM (STAPLE) ×9 IMPLANT
RETRACTOR LONRSTAR 16.6X16.6CM (MISCELLANEOUS) IMPLANT
RETRACTOR STAY HOOK 5MM (MISCELLANEOUS) IMPLANT
RETRACTOR STER APS 16.6X16.6CM (MISCELLANEOUS)
RTRCTR WOUND ALEXIS 18CM MED (MISCELLANEOUS) ×5
SEAL CANN UNIV 5-8 DVNC XI (MISCELLANEOUS) ×12 IMPLANT
SEAL XI 5MM-8MM UNIVERSAL (MISCELLANEOUS) ×20
SET TRI-LUMEN FLTR TB AIRSEAL (TUBING) ×5 IMPLANT
SOLUTION ELECTROLUBE (MISCELLANEOUS) ×5 IMPLANT
SPONGE GAUZE 2X2 STER 10/PKG (GAUZE/BANDAGES/DRESSINGS)
SPONGE LAP 18X18 RF (DISPOSABLE) ×10 IMPLANT
SPONGE LAP 4X18 RFD (DISPOSABLE) ×5 IMPLANT
STAPLE RELOAD 45 GRN (STAPLE) ×3 IMPLANT
STAPLE RELOAD 45MM GREEN (STAPLE) ×5
STAPLER ECHELON LONG 60 440 (INSTRUMENTS) ×5 IMPLANT
STAPLER RELOAD GREEN 60MM (STAPLE) ×15
STAPLER RELOAD WHITE 60MM (STAPLE) ×15
STENT SET URETHERAL LEFT 7FR (STENTS) IMPLANT
STENT SET URETHERAL RIGHT 7FR (STENTS) IMPLANT
SURGIFLO W/THROMBIN 8M KIT (HEMOSTASIS) IMPLANT
SUT CHROMIC 4 0 RB 1X27 (SUTURE) ×5 IMPLANT
SUT ETHILON 3 0 PS 1 (SUTURE) ×5 IMPLANT
SUT MNCRL AB 4-0 PS2 18 (SUTURE) ×10 IMPLANT
SUT PDS AB 0 CTX 36 PDP370T (SUTURE) ×15 IMPLANT
SUT PDS AB 1 CT1 27 (SUTURE) ×10 IMPLANT
SUT SILK 3 0 SH 30 (SUTURE) IMPLANT
SUT SILK 3 0 SH CR/8 (SUTURE) ×5 IMPLANT
SUT V-LOC BARB 180 2/0GR6 GS22 (SUTURE)
SUT VIC AB 2-0 CT1 27 (SUTURE)
SUT VIC AB 2-0 CT1 27XBRD (SUTURE) IMPLANT
SUT VIC AB 2-0 SH 18 (SUTURE) IMPLANT
SUT VIC AB 2-0 SH 27 (SUTURE) ×5
SUT VIC AB 2-0 SH 27X BRD (SUTURE) ×3 IMPLANT
SUT VIC AB 2-0 UR5 27 (SUTURE) ×20 IMPLANT
SUT VIC AB 3-0 SH 27 (SUTURE) ×30
SUT VIC AB 3-0 SH 27X BRD (SUTURE) ×6 IMPLANT
SUT VIC AB 3-0 SH 27XBRD (SUTURE) ×12 IMPLANT
SUT VIC AB 4-0 RB1 27 (SUTURE) ×20
SUT VIC AB 4-0 RB1 27XBRD (SUTURE) ×12 IMPLANT
SUT VICRYL 0 UR6 27IN ABS (SUTURE) ×5 IMPLANT
SUT VLOC BARB 180 ABS3/0GR12 (SUTURE) ×15
SUTURE V-LC BRB 180 2/0GR6GS22 (SUTURE) IMPLANT
SUTURE VLOC BRB 180 ABS3/0GR12 (SUTURE) ×9 IMPLANT
SYR 27GX1/2 1ML LL SAFETY (SYRINGE) ×5 IMPLANT
SYR CONTROL 10ML LL (SYRINGE) IMPLANT
SYSTEM UROSTOMY GENTLE TOUCH (WOUND CARE) IMPLANT
TOWEL OR NON WOVEN STRL DISP B (DISPOSABLE) ×5 IMPLANT
TROCAR BLADELESS 15MM (ENDOMECHANICALS) ×5 IMPLANT
TROCAR XCEL NON-BLD 5MMX100MML (ENDOMECHANICALS) IMPLANT
TUBING CONNECTING 10 (TUBING) ×4 IMPLANT
TUBING CONNECTING 10' (TUBING) ×1
WATER STERILE IRR 1000ML POUR (IV SOLUTION) ×5 IMPLANT
WATER STERILE IRR 3000ML UROMA (IV SOLUTION) ×5 IMPLANT

## 2020-08-30 NOTE — Anesthesia Preprocedure Evaluation (Addendum)
Anesthesia Evaluation  Patient identified by MRN, date of birth, ID band Patient awake    Reviewed: Allergy & Precautions, H&P , NPO status , Patient's Chart, lab work & pertinent test results, reviewed documented beta blocker date and time   Airway Mallampati: III  TM Distance: >3 FB Neck ROM: Full    Dental no notable dental hx. (+) Teeth Intact, Dental Advisory Given   Pulmonary neg pulmonary ROS, former smoker,    Pulmonary exam normal breath sounds clear to auscultation       Cardiovascular hypertension, Pt. on medications and Pt. on home beta blockers  Rhythm:Regular Rate:Normal     Neuro/Psych negative neurological ROS  negative psych ROS   GI/Hepatic negative GI ROS, Neg liver ROS,   Endo/Other  diabetes  Renal/GU negative Renal ROS   Bladder CA negative genitourinary   Musculoskeletal  (+) Arthritis , Osteoarthritis,    Abdominal   Peds  Hematology negative hematology ROS (+)   Anesthesia Other Findings   Reproductive/Obstetrics negative OB ROS                            Anesthesia Physical Anesthesia Plan  ASA: III  Anesthesia Plan: General   Post-op Pain Management:    Induction: Intravenous  PONV Risk Score and Plan: 3 and Ondansetron, Dexamethasone and Midazolam  Airway Management Planned: Oral ETT  Additional Equipment:   Intra-op Plan:   Post-operative Plan: Extubation in OR  Informed Consent: I have reviewed the patients History and Physical, chart, labs and discussed the procedure including the risks, benefits and alternatives for the proposed anesthesia with the patient or authorized representative who has indicated his/her understanding and acceptance.     Dental advisory given  Plan Discussed with: CRNA  Anesthesia Plan Comments:         Anesthesia Quick Evaluation

## 2020-08-30 NOTE — Anesthesia Postprocedure Evaluation (Signed)
Anesthesia Post Note  Patient: RONNALD SHEDDEN  Procedure(s) Performed: CYSTOSCOPY WITH INJECTION (N/A ) LAPAROSCOPY WITH PELVIC LYMPH NODE DISSECTION, LEFT URETERAL LYSIS (Abdomen)     Patient location during evaluation: PACU Anesthesia Type: General Level of consciousness: awake and alert Pain management: pain level controlled Vital Signs Assessment: post-procedure vital signs reviewed and stable Respiratory status: spontaneous breathing, nonlabored ventilation, respiratory function stable and patient connected to nasal cannula oxygen Cardiovascular status: blood pressure returned to baseline and stable Postop Assessment: no apparent nausea or vomiting Anesthetic complications: no   No complications documented.  Last Vitals:  Vitals:   08/30/20 1130 08/30/20 1145  BP: 130/85 125/73  Pulse: (!) 33 69  Resp: 13 11  Temp:  36.5 C  SpO2: 100% 100%    Last Pain:  Vitals:   08/30/20 1115  TempSrc:   PainSc: 6                  Mahika Vanvoorhis,W. EDMOND

## 2020-08-30 NOTE — Transfer of Care (Signed)
Immediate Anesthesia Transfer of Care Note  Patient: Blake Peterson  Procedure(s) Performed: CYSTOSCOPY WITH INJECTION (N/A ) LAPAROSCOPY WITH PELVIC LYMPH NODE DISSECTION, LEFT URETERAL LYSIS (Abdomen)  Patient Location: PACU  Anesthesia Type:General  Level of Consciousness: drowsy  Airway & Oxygen Therapy: Patient Spontanous Breathing and Patient connected to face mask oxygen  Post-op Assessment: Report given to RN and Post -op Vital signs reviewed and stable  Post vital signs: Reviewed and stable  Last Vitals:  Vitals Value Taken Time  BP 146/86 08/30/20 0951  Temp    Pulse 86 08/30/20 0952  Resp 14 08/30/20 0952  SpO2 100 % 08/30/20 0952  Vitals shown include unvalidated device data.  Last Pain:  Vitals:   08/30/20 3300  TempSrc: Oral  PainSc:          Complications: No complications documented.

## 2020-08-30 NOTE — Progress Notes (Signed)
Day of Surgery   Subjective/Chief Complaint:  Pt completed bowel prep to clear, stomal marking. Hgb 12's.    Objective: Vital signs in last 24 hours: Temp:  [97.5 F (36.4 C)-97.9 F (36.6 C)] 97.9 F (36.6 C) (12/10 4332) Pulse Rate:  [59-70] 70 (12/10 0632) Resp:  [18-20] 18 (12/10 9518) BP: (122-142)/(65-78) 142/70 (12/10 8416) SpO2:  [100 %] 100 % (12/10 6063) Weight:  [85.7 kg] 85.7 kg (12/10 0160) Last BM Date: 08/29/20  Intake/Output from previous day: 12/09 0701 - 12/10 0700 In: 1205.8 [P.O.:600; I.V.:605.8] Out: 9 [Stool:9] Intake/Output this shift: No intake/output data recorded.  Physical Exam Vitals reviewed.  Constitutional:      Comments: IN OR holding room 8. Very pleasant, at baseline.  HENT:     Head: Normocephalic.     Nose: Nose normal.     Mouth/Throat:     Mouth: Mucous membranes are moist.  Eyes:     Pupils: Pupils are equal, round, and reactive to light.  Cardiovascular:     Rate and Rhythm: Normal rate.  Pulmonary:     Effort: Pulmonary effort is normal.  Abdominal:     General: Abdomen is flat.     Comments: Stable mild truncal obesity. Stomal marking site noted.   Genitourinary:    Comments: No CVAT.  Musculoskeletal:        General: Normal range of motion.     Cervical back: Normal range of motion.  Skin:    General: Skin is warm.  Neurological:     General: No focal deficit present.     Mental Status: He is alert.  Psychiatric:        Mood and Affect: Mood normal.   Lab Results:  Recent Labs    08/29/20 1032  WBC 13.5*  HGB 12.3*  HCT 37.9*  PLT 224   BMET No results for input(s): NA, K, CL, CO2, GLUCOSE, BUN, CREATININE, CALCIUM in the last 72 hours. PT/INR No results for input(s): LABPROT, INR in the last 72 hours. ABG No results for input(s): PHART, HCO3 in the last 72 hours.  Invalid input(s): PCO2, PO2  Studies/Results: No results found.  Anti-infectives: Anti-infectives (From admission, onward)   Start      Dose/Rate Route Frequency Ordered Stop   08/30/20 0700  piperacillin-tazobactam (ZOSYN) IVPB 3.375 g        3.375 g 12.5 mL/hr over 240 Minutes Intravenous  Once 08/30/20 0649     08/30/20 0600  piperacillin-tazobactam (ZOSYN) IVPB 2.25 g  Status:  Discontinued       Note to Pharmacy: Dr. Tresa Moore wants patient to have 2.25g preop.   2.25 g 66.7 mL/hr over 30 Minutes Intravenous 30 min pre-op 08/29/20 0950 08/30/20 0649   08/29/20 1600  neomycin (MYCIFRADIN) tablet 1,000 mg        1,000 mg Oral Every 4 hours 08/29/20 1327 08/30/20 0233   08/29/20 1430  metroNIDAZOLE (FLAGYL) tablet 500 mg        500 mg Oral Every 4 hours 08/29/20 1327 08/29/20 2237      Assessment/Plan:  Proceed as planned with cysto/ICG, robotic cystoprostatectomy, node dissection, conduit diversion. Risks, benefits, alternatives, expected peri-op course discussed previously and reiterated today.   Blake Peterson 08/30/2020

## 2020-08-30 NOTE — Anesthesia Procedure Notes (Signed)
Procedure Name: Intubation Date/Time: 08/30/2020 7:40 AM Performed by: Sharlette Dense, CRNA Patient Re-evaluated:Patient Re-evaluated prior to induction Oxygen Delivery Method: Circle system utilized Preoxygenation: Pre-oxygenation with 100% oxygen Induction Type: IV induction Ventilation: Mask ventilation without difficulty and Oral airway inserted - appropriate to patient size Laryngoscope Size: Miller and 3 Grade View: Grade I Tube type: Oral Tube size: 8.0 mm Number of attempts: 1 Airway Equipment and Method: Stylet Placement Confirmation: ETT inserted through vocal cords under direct vision,  positive ETCO2 and breath sounds checked- equal and bilateral Secured at: 22 cm Tube secured with: Tape Dental Injury: Teeth and Oropharynx as per pre-operative assessment

## 2020-08-30 NOTE — Brief Op Note (Signed)
08/29/2020 - 08/30/2020  9:26 AM  PATIENT:  Blake Peterson  79 y.o. male  PRE-OPERATIVE DIAGNOSIS:  BLADDER CANCER, MALIGNANT HYDRONEPHROSIS  POST-OPERATIVE DIAGNOSIS:  BLADDER CANCER, MALIGNANT HYDRONEPHROSIS  PROCEDURE:  Procedure(s): CYSTOSCOPY WITH INJECTION (N/A) LAPAROSCOPY WITH PELVIC LYMPH NODE DISSECTION, LEFT URETERAL LYSIS  SURGEON:  Surgeon(s) and Role:    * Alexis Frock, MD - Primary  PHYSICIAN ASSISTANT:   ASSISTANTS: Debbrah Alar PA   ANESTHESIA:   local and general  EBL:  25 mL   BLOOD ADMINISTERED:none  DRAINS: 1- foley to gravity. 2 - JP to bulb   LOCAL MEDICATIONS USED:  MARCAINE     SPECIMEN:  Source of Specimen:  1 - perivesical tissue; 2 - pelvic lymph nodes  DISPOSITION OF SPECIMEN:  PATHOLOGY  COUNTS:  YES  TOURNIQUET:  * No tourniquets in log *  DICTATION: .Other Dictation: Dictation Number [id number]  PLAN OF CARE: Admit to inpatient   PATIENT DISPOSITION:  PACU - hemodynamically stable.   Delay start of Pharmacological VTE agent (>24hrs) due to surgical blood loss or risk of bleeding: yes

## 2020-08-31 ENCOUNTER — Encounter (HOSPITAL_COMMUNITY): Payer: Self-pay | Admitting: Urology

## 2020-08-31 LAB — BASIC METABOLIC PANEL
Anion gap: 9 (ref 5–15)
BUN: 31 mg/dL — ABNORMAL HIGH (ref 8–23)
CO2: 19 mmol/L — ABNORMAL LOW (ref 22–32)
Calcium: 8.8 mg/dL — ABNORMAL LOW (ref 8.9–10.3)
Chloride: 106 mmol/L (ref 98–111)
Creatinine, Ser: 1.69 mg/dL — ABNORMAL HIGH (ref 0.61–1.24)
GFR, Estimated: 41 mL/min — ABNORMAL LOW (ref >60)
Glucose, Bld: 158 mg/dL — ABNORMAL HIGH (ref 70–99)
Potassium: 4.7 mmol/L (ref 3.5–5.1)
Sodium: 134 mmol/L — ABNORMAL LOW (ref 135–145)

## 2020-08-31 LAB — HEMOGLOBIN AND HEMATOCRIT, BLOOD
HCT: 32.8 % — ABNORMAL LOW (ref 39.0–52.0)
Hemoglobin: 10.7 g/dL — ABNORMAL LOW (ref 13.0–17.0)

## 2020-08-31 MED ORDER — BISACODYL 10 MG RE SUPP
10.0000 mg | Freq: Every day | RECTAL | Status: DC | PRN
Start: 2020-08-31 — End: 2020-09-01
  Administered 2020-09-01: 10:00:00 10 mg via RECTAL
  Filled 2020-08-31: qty 1

## 2020-08-31 NOTE — Progress Notes (Signed)
Urology Inpatient Progress Report  Bladder cancer ([place]) [C67.9]  Procedure(s): CYSTOSCOPY WITH INJECTION LAPAROSCOPY WITH PELVIC LYMPH NODE DISSECTION, LEFT URETERAL LYSIS  1 Day Post-Op   Intv/Subj: Patient is doing well and without complaint this morning.  No nausea or emesis.  Active Problems:   Bladder cancer ([place])  Current Facility-Administered Medications  Medication Dose Route Frequency Provider Last Rate Last Admin  . 0.9 %  sodium chloride infusion (Manually program via Guardrails IV Fluids)   Intravenous Once Alexis Frock, MD      . acetaminophen (TYLENOL) tablet 1,000 mg  1,000 mg Oral Q6H Dancy, Amanda, PA-C   1,000 mg at 08/31/20 0510  . atenolol (TENORMIN) tablet 50 mg  50 mg Oral Daily Alexis Frock, MD   50 mg at 08/29/20 1530  . atorvastatin (LIPITOR) tablet 80 mg  80 mg Oral Jamal Collin, MD   80 mg at 08/30/20 2029  . Chlorhexidine Gluconate Cloth 2 % PADS 6 each  6 each Topical Daily Alexis Frock, MD      . dextrose 5 %-0.45 % sodium chloride infusion   Intravenous Continuous Debbrah Alar, PA-C 75 mL/hr at 08/31/20 0133 New Bag at 08/31/20 0133  . diphenhydrAMINE (BENADRYL) injection 12.5 mg  12.5 mg Intravenous Q6H PRN Dancy, Amanda, PA-C       Or  . diphenhydrAMINE (BENADRYL) 12.5 MG/5ML elixir 12.5 mg  12.5 mg Oral Q6H PRN Dancy, Amanda, PA-C      . docusate sodium (COLACE) capsule 100 mg  100 mg Oral BID Debbrah Alar, PA-C   100 mg at 08/30/20 2029  . heparin injection 5,000 Units  5,000 Units Subcutaneous Q8H Dancy, Estill Bamberg, PA-C   5,000 Units at 08/31/20 0510  . HYDROmorphone (DILAUDID) injection 0.5-1 mg  0.5-1 mg Intravenous Q2H PRN Debbrah Alar, PA-C   0.5 mg at 08/30/20 1226  . neomycin-bacitracin-polymyxin (NEOSPORIN) ointment packet 1 application  1 application Topical TID PRN Dancy, Amanda, PA-C      . ondansetron (ZOFRAN) injection 4 mg  4 mg Intravenous Q4H PRN Dancy, Amanda, PA-C      . opium-belladonna (B&O) suppository 16.2-60mg    1 suppository Rectal Q6H PRN Debbrah Alar, PA-C      . oxyCODONE (Oxy IR/ROXICODONE) immediate release tablet 5 mg  5 mg Oral Q4H PRN Debbrah Alar, PA-C   5 mg at 08/31/20 0137     Objective: Vital: Vitals:   08/30/20 1707 08/30/20 2050 08/30/20 2333 08/31/20 0448  BP: 115/70 (!) 106/59 (!) 109/59 124/65  Pulse: (!) 49 65 93 75  Resp: 18 19 19 19   Temp: 99.7 F (37.6 C) 98.4 F (36.9 C) 98.2 F (36.8 C) 98 F (36.7 C)  TempSrc: Oral Oral Oral Oral  SpO2: 97% 95% 95% 96%  Weight:      Height:       I/Os: I/O last 3 completed shifts: In: 5001.3 [P.O.:360; I.V.:3941.3; Other:400; IV Piggyback:300] Out: 619 [Urine:550; Drains:70; Blood:25]  Physical Exam:  General: Patient is in no apparent distress Lungs: Normal respiratory effort, chest expands symmetrically. GI: Incisions are c/d/i.  The abdomen is soft and nontender without mass. JP drain with serosanguinous drainage (20/50) Foley: (150/400) clear yellow urine Ext: lower extremities symmetric  Lab Results: Recent Labs    08/29/20 1032 08/30/20 1125 08/31/20 0601  WBC 13.5*  --   --   HGB 12.3* 12.6* 10.7*  HCT 37.9* 39.3 32.8*   Recent Labs    08/30/20 1034 08/31/20 0601  NA 132* 134*  K  5.0 4.7  CL 105 106  CO2 20* 19*  GLUCOSE 144* 158*  BUN 21 31*  CREATININE 1.10 1.69*  CALCIUM 8.9 8.8*   No results for input(s): LABPT, INR in the last 72 hours. No results for input(s): LABURIN in the last 72 hours. Results for orders placed or performed during the hospital encounter of 08/29/20  SARS CORONAVIRUS 2 (TAT 6-24 HRS) Nasopharyngeal Nasal Mucosa     Status: None   Collection Time: 08/29/20  5:01 PM   Specimen: Nasal Mucosa; Nasopharyngeal  Result Value Ref Range Status   SARS Coronavirus 2 NEGATIVE NEGATIVE Final    Comment: (NOTE) SARS-CoV-2 target nucleic acids are NOT DETECTED.  The SARS-CoV-2 RNA is generally detectable in upper and lower respiratory specimens during the acute phase of  infection. Negative results do not preclude SARS-CoV-2 infection, do not rule out co-infections with other pathogens, and should not be used as the sole basis for treatment or other patient management decisions. Negative results must be combined with clinical observations, patient history, and epidemiological information. The expected result is Negative.  Fact Sheet for Patients: SugarRoll.be  Fact Sheet for Healthcare Providers: https://www.woods-mathews.com/  This test is not yet approved or cleared by the [place] FDA and  has been authorized for detection and/or diagnosis of SARS-CoV-2 by FDA under an Emergency Use Authorization (EUA). This EUA will remain  in effect (meaning this test can be used) for the duration of the COVID-19 declaration under Se ction 564(b)(1) of the Act, 21 U.S.C. section 360bbb-3(b)(1), unless the authorization is terminated or revoked sooner.  Performed at [facility], [place] [address] [place], [place] [address]   Surgical PCR screen     Status: None   Collection Time: 08/29/20  5:01 PM   Specimen: Nasal Mucosa; Nasal Swab  Result Value Ref Range Status   MRSA, PCR NEGATIVE NEGATIVE Final   Staphylococcus aureus NEGATIVE NEGATIVE Final    Comment: (NOTE) The Xpert SA Assay (FDA approved for NASAL specimens in patients 78 years of age and older), is one component of a comprehensive surveillance program. It is not intended to diagnose infection nor to guide or monitor treatment. Performed at [facility], [place] [address], [place], [place] [address]     Assessment: 79 year old man with advanced bladder cancer pod #1 S/P robotic assisted laparoscopic pelvic lymph node dissection and left ureterolysis.  Plan: -JP removed this AM -Creatinine bumped this AM, will leave foley in for now and repeat BMP in AM -encourage ambulation -regular diet -anticipate d/c  tomorrow  [name], MD Urology 08/31/2020, 7:03 AM

## 2020-08-31 NOTE — Plan of Care (Signed)

## 2020-09-01 LAB — BASIC METABOLIC PANEL
Anion gap: 10 (ref 5–15)
BUN: 24 mg/dL — ABNORMAL HIGH (ref 8–23)
CO2: 22 mmol/L (ref 22–32)
Calcium: 9.2 mg/dL (ref 8.9–10.3)
Chloride: 104 mmol/L (ref 98–111)
Creatinine, Ser: 1.43 mg/dL — ABNORMAL HIGH (ref 0.61–1.24)
GFR, Estimated: 50 mL/min — ABNORMAL LOW (ref >60)
Glucose, Bld: 118 mg/dL — ABNORMAL HIGH (ref 70–99)
Potassium: 3.8 mmol/L (ref 3.5–5.1)
Sodium: 136 mmol/L (ref 135–145)

## 2020-09-01 NOTE — Discharge Summary (Signed)
Date of admission: 08/29/2020  Date of discharge: 09/01/2020  Admission diagnosis: bladder cancer  Discharge diagnosis: bladder cancer  Secondary diagnoses:  Patient Active Problem List   Diagnosis Date Noted  . Bladder cancer ([place]) 08/30/2020  . 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 Syncope and collapse 04/24/2019  . Weakness of both lower extremities 02/24/2019  . 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 High risk medication use 01/04/2019  . BPH (benign prostatic hyperplasia) 12/13/2018  . 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 Overweight (BMI 25.0-29.9)   . Hypertensive retinopathy of both eyes, grade 1   . Health maintenance examination 08/10/2014  . Goals of care, counseling/discussion 08/10/2014  . Bilateral hearing loss due to cerumen impaction 08/10/2014  . Essential tremor 09/09/2012  . Medicare annual wellness visit, subsequent 08/07/2011  . History of colonic polyps   . Diverticulosis of colon   . HTN (hypertension)   . Hyperlipidemia associated with type 2 diabetes mellitus ([place])   . Controlled type 2 diabetes mellitus without complication, without long-term current use of insulin ([place]) 08/11/2010  . Vitamin D deficiency 08/07/2010  . 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 Elevated PSA 02/16/2007    Procedures performed: Procedure(s): CYSTOSCOPY WITH INJECTION LAPAROSCOPY WITH PELVIC LYMPH NODE DISSECTION, LEFT URETERAL LYSIS  History and Physical: For full details, please see admission history and physical. Briefly, ESHAWN Peterson is a 79 y.o. year old patient with advanced, unresectable bladder cancer who underwent robotic assisted laparoscopic pelvic lymph node dissection and left ureterolysis.   Hospital Course: Patient tolerated the procedure well.  He was then transferred to the floor after an uneventful PACU stay.  His hospital  course was uncomplicated.  On POD#2 he had met discharge criteria: was eating a regular diet, was up and ambulating independently,  pain was well controlled, was voiding without a catheter, and was ready to for discharge.   Laboratory values:  Recent Labs    08/29/20 1032 08/30/20 1125 08/31/20 0601  WBC 13.5*  --   --   HGB 12.3* 12.6* 10.7*  HCT 37.9* 39.3 32.8*   Recent Labs    08/30/20 1034 08/31/20 0601 09/01/20 0534  NA 132* 134* 136  K 5.0 4.7 3.8  CL 105 106 104  CO2 20* 19* 22  GLUCOSE 144* 158* 118*  BUN 21 31* 24*  CREATININE 1.10 1.69* 1.43*  CALCIUM 8.9 8.8* 9.2   No results for input(s): LABPT, INR in the last 72 hours. No results for input(s): LABURIN in the last 72 hours. Results for orders placed or performed during the hospital encounter of 08/29/20  SARS CORONAVIRUS 2 (TAT 6-24 HRS) Nasopharyngeal Nasal Mucosa     Status: None   Collection Time: 08/29/20  5:01 PM   Specimen: Nasal Mucosa; Nasopharyngeal  Result Value Ref Range Status   SARS Coronavirus 2 NEGATIVE NEGATIVE Final    Comment: (NOTE) SARS-CoV-2 target nucleic acids are NOT DETECTED.  The SARS-CoV-2 RNA is generally detectable in upper and lower respiratory specimens during the acute phase of infection. Negative results do not preclude SARS-CoV-2 infection, do not rule out co-infections with other pathogens, and should not be used as the sole basis for treatment or other patient management decisions. Negative results must be combined with clinical observations, patient history, and epidemiological information. The expected result is Negative.  Fact Sheet for Patients: SugarRoll.be  Fact Sheet for Healthcare Providers: https://www.woods-mathews.com/  This test is  not yet approved or cleared by the [place] and  has been authorized for detection and/or diagnosis of SARS-CoV-2 by FDA under an Emergency Use Authorization (EUA). This EUA  will remain  in effect (meaning this test can be used) for the duration of the COVID-19 declaration under Se ction 564(b)(1) of the Act, 21 U.S.C. section 360bbb-3(b)(1), unless the authorization is terminated or revoked sooner.  Performed at [facility], [place] [address], [place], [place] [address]   Surgical PCR screen     Status: None   Collection Time: 08/29/20  5:01 PM   Specimen: Nasal Mucosa; Nasal Swab  Result Value Ref Range Status   MRSA, PCR NEGATIVE NEGATIVE Final   Staphylococcus aureus NEGATIVE NEGATIVE Final    Comment: (NOTE) The Xpert SA Assay (FDA approved for NASAL specimens in patients 50 years of age and older), is one component of a comprehensive surveillance program. It is not intended to diagnose infection nor to guide or monitor treatment. Performed at [facility], [place] [address] [place], [place] [address]     Disposition: Home  Discharge instruction: The patient was instructed to be ambulatory but told to refrain from heavy lifting, strenuous activity, or driving.   Discharge medications:  Allergies as of 09/01/2020      Reactions   Naprosyn [naproxen] Swelling   Fingers and ankles swelling      Medication List    TAKE these medications   acetaminophen 500 MG tablet Commonly known as: TYLENOL Take 1 tablet (500 mg total) by mouth every 6 (six) hours as needed for moderate pain.   atenolol 50 MG tablet Commonly known as: TENORMIN TAKE 1 TABLET BY MOUTH EVERY DAY   atorvastatin 80 MG tablet Commonly known as: LIPITOR TAKE 1 TABLET BY MOUTH EVERYDAY AT BEDTIME What changed: See the new instructions.   chlorthalidone 25 MG tablet Commonly known as: HYGROTON TAKE 1 TABLET BY MOUTH EVERY DAY   enalapril 10 MG tablet Commonly known as: VASOTEC TAKE 1 TABLET BY MOUTH TWICE A DAY   FISH OIL PO Take 300 mg by mouth daily.   folic acid 1 MG tablet Commonly known as: FOLVITE Take 1 mg by mouth daily.    HYDROcodone-acetaminophen 5-325 MG tablet Commonly known as: Norco Take 1-2 tablets by mouth every 6 (six) hours as needed for moderate pain.   Klor-Con M10 10 MEQ tablet Generic drug: potassium chloride TAKE 1 TABLET BY MOUTH EVERY DAY What changed: how much to take   methotrexate 2.5 MG tablet Commonly known as: RHEUMATREX Take 20 mg by mouth once a week. Caution:Chemotherapy. Protect from light. Thursday   predniSONE 5 MG tablet Commonly known as: DELTASONE Take 5 mg by mouth daily as needed (arthritis flare).   tamsulosin 0.4 MG Caps capsule Commonly known as: FLOMAX Take 0.4 mg by mouth at bedtime as needed (Urinartion problem). after supper   vitamin B-12 1000 MCG tablet Commonly known as: CYANOCOBALAMIN Take 1,000 mcg by mouth daily.   VITAMIN D-3 PO Take 50 mcg by mouth daily.       Followup:   Follow-up Information    Alexis Frock, MD On 09/19/2020.   Specialty: Urology Why: at 12:15 for MD visit.  Contact information: [place] [address] [phone]

## 2020-09-01 NOTE — Progress Notes (Signed)
Urology Inpatient Progress Report   Procedure(s): CYSTOSCOPY WITH INJECTION LAPAROSCOPY WITH PELVIC LYMPH NODE DISSECTION, LEFT URETERAL LYSIS  2 Days Post-Op   Intv/Subj: No acute events overnight.  Patient had flatus but feels constipated.  Tolerating regular diet.  Has been up ambulating.  Active Problems:   Bladder cancer (Blake Peterson)  Current Facility-Administered Medications  Medication Dose Route Frequency Provider Last Rate Last Admin  . 0.9 %  sodium chloride infusion (Manually program via Guardrails IV Fluids)   Intravenous Once Alexis Frock, MD      . atenolol (TENORMIN) tablet 50 mg  50 mg Oral Daily Alexis Frock, MD   50 mg at 08/31/20 1023  . atorvastatin (LIPITOR) tablet 80 mg  80 mg Oral QHS Alexis Frock, MD   80 mg at 08/31/20 2226  . bisacodyl (DULCOLAX) suppository 10 mg  10 mg Rectal Daily PRN Alexis Frock, MD      . Chlorhexidine Gluconate Cloth 2 % PADS 6 each  6 each Topical Daily Alexis Frock, MD   6 each at 08/31/20 0800  . dextrose 5 %-0.45 % sodium chloride infusion   Intravenous Continuous Debbrah Alar, PA-C 75 mL/hr at 09/01/20 0355 New Bag at 09/01/20 0355  . diphenhydrAMINE (BENADRYL) injection 12.5 mg  12.5 mg Intravenous Q6H PRN Dancy, Amanda, PA-C       Or  . diphenhydrAMINE (BENADRYL) 12.5 MG/5ML elixir 12.5 mg  12.5 mg Oral Q6H PRN Dancy, Amanda, PA-C      . docusate sodium (COLACE) capsule 100 mg  100 mg Oral BID Debbrah Alar, PA-C   100 mg at 08/31/20 2228  . heparin injection 5,000 Units  5,000 Units Subcutaneous Q8H Dancy, Estill Bamberg, PA-C   5,000 Units at 09/01/20 0555  . HYDROmorphone (DILAUDID) injection 0.5-1 mg  0.5-1 mg Intravenous Q2H PRN Debbrah Alar, PA-C   0.5 mg at 08/30/20 1226  . neomycin-bacitracin-polymyxin (NEOSPORIN) ointment packet 1 application  1 application Topical TID PRN Dancy, Amanda, PA-C      . ondansetron (ZOFRAN) injection 4 mg  4 mg Intravenous Q4H PRN Dancy, Amanda, PA-C      . opium-belladonna (B&O)  suppository 16.2-60mg   1 suppository Rectal Q6H PRN Debbrah Alar, PA-C      . oxyCODONE (Oxy IR/ROXICODONE) immediate release tablet 5 mg  5 mg Oral Q4H PRN Debbrah Alar, PA-C   5 mg at 09/01/20 0556     Objective: Vital: Vitals:   08/31/20 1020 08/31/20 1406 08/31/20 2021 09/01/20 0545  BP: 117/65 135/65 133/71 (!) 156/84  Pulse: 75 73 68 68  Resp: 20 14 18 18   Temp: 97.8 F (36.6 C) 97.7 F (36.5 C) 98.4 F (36.9 C) 97.6 F (36.4 C)  TempSrc: Oral Oral Oral Oral  SpO2: 97% 95% 98% 96%  Weight:      Height:       I/Os: I/O last 3 completed shifts: In: 4161.1 [P.O.:1080; I.V.:2681.1; Other:400] Out: 2875 [Urine:2825; Drains:50]  Physical Exam:  General: Patient is in no apparent distress Lungs: Normal respiratory effort, chest expands symmetrically. GI: Incisions are c/d/i. The abdomen is soft and nontender. Foley: Clear yellow urine Ext: lower extremities symmetric  Lab Results: Recent Labs    08/29/20 1032 08/30/20 1125 08/31/20 0601  WBC 13.5*  --   --   HGB 12.3* 12.6* 10.7*  HCT 37.9* 39.3 32.8*   Recent Labs    08/30/20 1034 08/31/20 0601  NA 132* 134*  K 5.0 4.7  CL 105 106  CO2 20* 19*  GLUCOSE  144* 158*  BUN 21 31*  CREATININE 1.10 1.69*  CALCIUM 8.9 8.8*   No results for input(s): LABPT, INR in the last 72 hours. No results for input(s): LABURIN in the last 72 hours. Results for orders placed or performed during the hospital encounter of 08/29/20  SARS CORONAVIRUS 2 (TAT 6-24 HRS) Nasopharyngeal Nasal Mucosa     Status: None   Collection Time: 08/29/20  5:01 PM   Specimen: Nasal Mucosa; Nasopharyngeal  Result Value Ref Range Status   SARS Coronavirus 2 NEGATIVE NEGATIVE Final    Comment: (NOTE) SARS-CoV-2 target nucleic acids are NOT DETECTED.  The SARS-CoV-2 RNA is generally detectable in upper and lower respiratory specimens during the acute phase of infection. Negative results do not preclude SARS-CoV-2 infection, do not rule  out co-infections with other pathogens, and should not be used as the sole basis for treatment or other patient management decisions. Negative results must be combined with clinical observations, patient history, and epidemiological information. The expected result is Negative.  Fact Sheet for Patients: SugarRoll.be  Fact Sheet for Healthcare Providers: https://www.woods-mathews.com/  This test is not yet approved or cleared by the [place] FDA and  has been authorized for detection and/or diagnosis of SARS-CoV-2 by FDA under an Emergency Use Authorization (EUA). This EUA will remain  in effect (meaning this test can be used) for the duration of the COVID-19 declaration under Se ction 564(b)(1) of the Act, 21 U.S.C. section 360bbb-3(b)(1), unless the authorization is terminated or revoked sooner.  Performed at [facility], [place] [address], [place], [place] [address]   Surgical PCR screen     Status: None   Collection Time: 08/29/20  5:01 PM   Specimen: Nasal Mucosa; Nasal Swab  Result Value Ref Range Status   MRSA, PCR NEGATIVE NEGATIVE Final   Staphylococcus aureus NEGATIVE NEGATIVE Final    Comment: (NOTE) The Xpert SA Assay (FDA approved for NASAL specimens in patients 42 years of age and older), is one component of a comprehensive surveillance program. It is not intended to diagnose infection nor to guide or monitor treatment. Performed at [facility], [place] [address], [place], [place] [address]     Assessment: 79 year old man with advanced bladder cancer pod #2 S/P robotic assisted laparoscopic pelvic lymph node dissection and left ureterolysis.  Creatinine downtrending and patient with return of bowel function.  Plan: -Creatinine downtrending this morning, will remove Foley catheter -encourage ambulation -regular diet -bowel regimen -d/c today  [name], MD Urology 09/01/2020, 7:41  AM

## 2020-09-02 LAB — TYPE AND SCREEN
ABO/RH(D): B POS
Antibody Screen: NEGATIVE
Unit division: 0
Unit division: 0

## 2020-09-02 LAB — BPAM RBC
Blood Product Expiration Date: 202201042359
Blood Product Expiration Date: 202201042359
Unit Type and Rh: 7300
Unit Type and Rh: 7300

## 2020-09-02 LAB — SURGICAL PATHOLOGY

## 2020-09-02 NOTE — Op Note (Signed)
NAME: BRITTON, BERA MEDICAL RECORD YI:[id number] ACCOUNT [id number] DATE OF BIRTH:Jan 09, 1941 FACILITY: [facility] LOCATION: [facility] PHYSICIAN:Aleja Yearwood, MD  OPERATIVE REPORT  DATE OF PROCEDURE:  08/30/2020  PREOPERATIVE DIAGNOSIS:  High-grade muscle invasive bladder cancer.  POSTOPERATIVE DIAGNOSIS:  Widely metastatic bladder cancer.  PROCEDURES: 1.  Cystoscopy with injection of indocyanine dye. 2.  Laparoscopy with pelvic lymph node dissection. 3.  Left ureterolysis.  ESTIMATED BLOOD LOSS:  Nil.  COMPLICATIONS:  None.  SPECIMENS: 1.  Left perivesical tissue.  Frozen section positive for carcinoma. 2.  Left obturator lymph node.  Frozen section positive for carcinoma. 3.  Left obturator lymph nodes.  ASSISTANT:  Bari Mantis, PA  DRAINS: 1.  Jackson-Pratt drain to bulb suction. 2.  Foley catheter to straight drain.  INDICATIONS:  The patient is an incredibly pleasant 79 year old man with history of high-grade muscle invasive bladder cancer.  He was referred for consideration of curative intent cystectomy.  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  He  was referred for consideration of neoadjuvant chemotherapy; however, given his marginal renal function, it was felt that upfront surgery is the most advantageous path.  He presents for this.  He was admitted yesterday for bowel prep, stomal marking.   Informed consent was obtained and placed in medical record.  DESCRIPTION OF PROCEDURE:  The patient being Blake Peterson, procedure being robotic-assisted cystectomy was confirmed.  Procedure timeout was performed.  Intravenous antibiotics were administered.  General endotracheal anesthesia was induced.  The patient was  placed into a low lithotomy position.  Sterile field was created, prepped and draped the patient's penis, perineum and proximal thighs using iodine and his infra-xiphoid abdomen using chlorhexidine gluconate.  He was further fastened to operative  table  using 3-inch tape with foam padding across supraxiphoid chest.  A test of steep Trendelenburg positioning was performed.  He was found to be suitably positioned.  Next, cystourethroscopy was performed with 24-French injection scope.  Scope set with  0-degree lens.  Both anterior and posterior urethra was unremarkable.  Inspection of bladder revealed significant tumor infiltration of the area of the trigone.  There was minimal intraluminal tumor.  This mostly appeared to be within the wall of the  bladder.  The ureteral orifices were completely obliterated.  Next, 2 mL of indocyanine green dye was injected across 6 submucosal blebs in the area of the trigone for sentinel lymph angiography and tumor marking.  A silicone type catheter was then  placed per urethra to straight drain.  Next, a high-flow, low-pressure pneumoperitoneum was obtained using Veress technique in the right lower quadrant, having passed the aspiration and drop test.  The site was chosen as it was away from his area of  prior umbilical hernia repair.  An 8 mm robotic camera port was then placed in location.  Laparoscopic examination of peritoneal cavity.  There were no adhesions or visceral injury.  Distal ports were placed as follows:  Supraumbilical 8 mm robotic port,  right paramedian 15 mm assistant port site at the previously marked stomal site, right paramedian 8 mm robotic port, right far lateral 12 mm assist port, left far lateral 8 mm robotic port.  Robot was docked and passed electronic checks.  Examination of  the peritoneal cavity at this point did reveal some variegated tissue along the peritoneum overlying the trigone of the bladder and prostate base, somewhat worrisome for locally advanced disease in this area, but no obvious intraperitoneal disease.   Attention was directed at the left  retroperitoneal dissection.  Incision was made lateral to the descending colon near the internal iliac vessels superiorly for 10 cm  and then distally lateral to the left medial umbilical ligament towards the anterior  abdominal wall.  This created a large retroperitoneal flap, which was then carefully mobilized medially.  Within the retroperitoneum, the tissue was quite desmoplastic and tissue planes were completely distorted.  There was very dense desmoplastic tissue  overlying the left lateral aspect of the bladder as well as completely encompassing the left iliac vessels.  This is highly concerning for a large volume of locally advanced disease.  The left ureter was visualized coursing over the iliac vessels.  It  was circumferentially mobilized proximally and distally for a distance of  approximately 3 cm.  Again, this appeared to be completely encompassed within desmoplastic reaction, highly concerning for locally advanced disease and this desmoplastic reaction  appeared to encompass the ureter even proximally well above the iliac crossing.  It was highly concerning for locally advanced disease that would likely be unreconstructable. Given these findings, lymphatic sampling was  performed on the left side of the left obturator lymph node, which was grossly enlarged with boundaries being left external iliac vein, pelvic sidewall, obturator nerve.  Lymphostasis was achieved with cold clips, set aside for frozen section, labeled  left obturator lymph node, did harbor metastatic cancer.  The desmoplastic tissue overlying the left lateral bladder wall was also set aside for frozen section, also found to contain metastatic cancer.  Lymphatic tissue overlying the left common iliac  lymph node area.  This was carefully dissected free and set aside labeled left common iliac lymph nodes.  Given the visual appearance of very locally advanced disease encompassing the left distal ureter to a level that likely diversion would be  unsuccessful as well as the fact that major extirpative surgery would not render him disease free, but significantly  increase his morbidity; thus, it was felt that the most reasonable means of management would be to not perform any additional extirpative  portions today.  I discussed the intraoperative findings with the patient's wife, Sonia Baller, who corroborated desire not to proceed with cystectomy and urinary diversion even in a palliative setting as his quality of life already is fairly good.  As such, a  closed suction drain was brought through the previous left lateral most port site into the peritoneal cavity.  Robot was undocked.  The previous right 12 mm assistant port AirSeal site was closed at the level of fascia using Carter-Thomason suture  passer and PDS suture and the 50 mm stomal site was closed similarly using a Carter-Thomason suture passer and PDS.  All incision sites were infiltrated with dilute lipolyzed Marcaine and closed at the level of the skin using subcuticular Monocryl and  Dermabond.  The procedure was then terminated.  The patient tolerated the procedure well.  No immediate complications.  The patient was taken to postanesthesia care unit in stable condition with plan for urgent renal function labs, possibly consider  nephrostomy tubes if significant metabolic abnormality pending goals of the patient's family.  Of note, first assistant, Debbrah Alar, was crucial for all portions of the procedure today.  She provided invaluable retraction, suctioning, specimen manipulation, lymphatic clipping, and general first assistance.  IN/NUANCE  D:08/30/2020 T:08/30/2020 JOB:[id number]

## 2020-09-03 ENCOUNTER — Telehealth: Payer: Self-pay | Admitting: Oncology

## 2020-09-03 NOTE — Telephone Encounter (Signed)
Scheduled appts per 12/14 sch msg. Left voicemail with appt date and time.

## 2020-09-11 ENCOUNTER — Other Ambulatory Visit: Payer: Self-pay

## 2020-09-11 ENCOUNTER — Inpatient Hospital Stay: Payer: PPO | Attending: Oncology | Admitting: Oncology

## 2020-09-11 VITALS — BP 112/60 | HR 66 | Temp 97.5°F | Resp 18 | Ht 68.0 in | Wt 189.3 lb

## 2020-09-11 DIAGNOSIS — C679 Malignant neoplasm of bladder, unspecified: Secondary | ICD-10-CM

## 2020-09-11 DIAGNOSIS — Z79899 Other long term (current) drug therapy: Secondary | ICD-10-CM | POA: Diagnosis not present

## 2020-09-11 DIAGNOSIS — C778 Secondary and unspecified malignant neoplasm of lymph nodes of multiple regions: Secondary | ICD-10-CM | POA: Diagnosis not present

## 2020-09-11 MED ORDER — LIDOCAINE-PRILOCAINE 2.5-2.5 % EX CREA: 1.0000 "application " | TOPICAL_CREAM | CUTANEOUS | 0 refills | Status: DC | PRN

## 2020-09-11 MED ORDER — PROCHLORPERAZINE MALEATE 10 MG PO TABS: 10.0000 mg | ORAL_TABLET | Freq: Four times a day (QID) | ORAL | 0 refills | Status: DC | PRN

## 2020-09-11 NOTE — Progress Notes (Signed)
Hematology and Oncology Follow Up Visit  Blake Peterson [id number] Jul 11, 1941 79 y.o. 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  He developed a stage IV disease in December 2021.   Prior Therapy:  He is status post attempted radical cystectomy on August 30, 2020.  He subsequently underwent lymph node dissection and left ureteral lysis completed by Dr. Tresa Moore on August 30, 2020.  He was found to have perivesicular involvement of his tumor with metastatic disease into the left obturator and common iliac lymph nodes.  Current therapy: Under evaluation for systemic therapy.  Interim History: Blake Peterson returns today for a follow-up visit.  Since last visit, he underwent surgical resection for his bladder and found to have locally advanced disease that is not resectable on August 30, 2020.  Since his discharge, he reports feeling better at this time with decreasing his pain and requiring less pain medication.  He does report nocturia but no hematuria.  He is eating well and ambulating without any major difficulties.  He denies any falls or syncope.    Medications: I have reviewed the patient's current medications.  Current Outpatient Medications  Medication Sig Dispense Refill  . acetaminophen (TYLENOL) 500 MG tablet Take 1 tablet (500 mg total) by mouth every 6 (six) hours as needed for moderate pain.    Marland Kitchen atenolol (TENORMIN) 50 MG tablet TAKE 1 TABLET BY MOUTH EVERY DAY (Patient taking differently: Take 50 mg by mouth daily.) 90 tablet 3  . atorvastatin (LIPITOR) 80 MG tablet TAKE 1 TABLET BY MOUTH EVERYDAY AT BEDTIME (Patient taking differently: Take 80 mg by mouth at bedtime.) 90 tablet 3  . chlorthalidone (HYGROTON) 25 MG tablet TAKE 1 TABLET BY MOUTH EVERY DAY (Patient taking differently: Take 25 mg by mouth daily.) 90 tablet 3  . Cholecalciferol  (VITAMIN D-3 PO) Take 50 mcg by mouth daily.     . enalapril (VASOTEC) 10 MG tablet TAKE 1 TABLET BY MOUTH TWICE A DAY (Patient taking differently: Take 10 mg by mouth 2 (two) times daily.) 106 tablet 1  . folic acid (FOLVITE) 1 MG tablet Take 1 mg by mouth daily.    Marland Kitchen HYDROcodone-acetaminophen (NORCO) 5-325 MG tablet Take 1-2 tablets by mouth every 6 (six) hours as needed for moderate pain. 20 tablet 0  . KLOR-CON M10 10 MEQ tablet TAKE 1 TABLET BY MOUTH EVERY DAY (Patient taking differently: Take 10 mEq by mouth daily.) 90 tablet 1  . methotrexate (RHEUMATREX) 2.5 MG tablet Take 20 mg by mouth once a week. Caution:Chemotherapy. Protect from light. Thursday    . Omega-3 Fatty Acids (FISH OIL PO) Take 300 mg by mouth daily.     . predniSONE (DELTASONE) 5 MG tablet Take 5 mg by mouth daily as needed (arthritis flare).     . tamsulosin (FLOMAX) 0.4 MG CAPS capsule Take 0.4 mg by mouth at bedtime as needed (Urinartion problem). after supper    . vitamin B-12 (CYANOCOBALAMIN) 1000 MCG tablet Take 1,000 mcg by mouth daily.     No current facility-administered medications for this visit.     Allergies:  Allergies  Allergen Reactions  . Naprosyn [Naproxen] Swelling    Fingers and ankles swelling      Physical Exam: Blood pressure 112/60, pulse 66, temperature (!) 97.5 F (36.4 C), temperature source Tympanic, resp. rate 18, height 5\' 8"  (1.727 m), weight 189 lb 4.8 oz (85.9 kg),  SpO2 100 %.   ECOG:    General appearance: Comfortable appearing without any discomfort Head: Normocephalic without any trauma Oropharynx: Mucous membranes are moist and pink without any thrush or ulcers. Eyes: Pupils are equal and round reactive to light. Lymph nodes: No cervical, supraclavicular, inguinal or axillary lymphadenopathy.   Heart:regular rate and rhythm.  S1 and S2 without leg edema. Lung: Clear without any rhonchi or wheezes.  No dullness to percussion. Abdomin: Soft, nontender, nondistended  with good bowel sounds.  No hepatosplenomegaly. Musculoskeletal: No joint deformity or effusion.  Full range of motion noted. Neurological: No deficits noted on motor, sensory and deep tendon reflex exam. Skin: No petechial rash or dryness.  Appeared moist.      Lab Results: Lab Results  Component Value Date   WBC 13.5 (H) 08/29/2020   HGB 10.7 (L) 08/31/2020   HCT 32.8 (L) 08/31/2020   MCV 94.3 08/29/2020   PLT 224 08/29/2020     Chemistry      Component Value Date/Time   NA 136 09/01/2020 0534   K 3.8 09/01/2020 0534   CL 104 09/01/2020 0534   CO2 22 09/01/2020 0534   BUN 24 (H) 09/01/2020 0534   CREATININE 1.43 (H) 09/01/2020 0534   CREATININE 2.34 (H) 06/19/2020 1002      Component Value Date/Time   CALCIUM 9.2 09/01/2020 0534   ALKPHOS 76 08/30/2020 1034   AST 29 08/30/2020 1034   AST 20 06/19/2020 1002   ALT 18 08/30/2020 1034   ALT 24 06/19/2020 1002   BILITOT 1.1 08/30/2020 1034   BILITOT 0.6 06/19/2020 1002       Impression and Plan:  79 year old with  1.    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  He has lymph node involvement at this time.    His disease status was updated at this time and treatment options were reviewed.  Given the fact that he has locally advanced disease with lymph node involvement as well as aggressive involvement of the bladder curative surgical options are not advisable at this time.  In this setting, I recommend proceeding with systemic chemotherapy utilizing gemcitabine and carboplatin given his renal dysfunction.  The treatment would be palliative at this time given his advanced disease.  If he has a complete response and consideration for possible salvage cystectomy would be considered.  Complication associated with chemotherapy were discussed again.  These would include nausea, vomiting, myelosuppression, neutropenia possible sepsis.   After discussion, he is agreeable to proceed.   2. IV access:Risks and benefits of using Port-A-Cath versus peripheral veins was discussed today.  Complication associated with Port-A-Cath insertion include bleeding, infection and thrombosis.  After discussing the risks and benefits, he is agreeable to proceed.  3. Antiemetics: Prescription for Compazine will be available to him.  4. Renal function surveillance:  His creatinine clearance is around 40 to 50 cc/min we will continue to monitor on platinum therapy.  5. Goals of care:His disease is incurable and any treatment is palliative at this time.  6. Follow-up:  Will be in the near future to start therapy.  30  minutes were spent on this encounter.  Time was dedicated to reviewing his disease status, discussing treatment options and complications related to his therapy.     Zola Button, MD 12/22/20211:58 PM

## 2020-09-11 NOTE — Progress Notes (Signed)
DISCONTINUE ON PATHWAY REGIMEN - Bladder     A cycle is every 21 days:     Gemcitabine      Cisplatin   **Always confirm dose/schedule in your pharmacy ordering system**  REASON: Other Reason PRIOR TREATMENT: [id number]: Gemcitabine 1,000 mg/m2 D1, 8 + Cisplatin 70 mg/m2 D1 q21 Days x 4 Cycles TREATMENT RESPONSE: Unable to Evaluate  START ON PATHWAY REGIMEN - Bladder     A cycle is every 21 days:     Carboplatin      Gemcitabine   **Always confirm dose/schedule in your pharmacy ordering system**  Patient Characteristics: Advanced/Metastatic Disease, First Line, No Prior Platinum-Based Therapy, Poor Renal Function (CrCl < 50 mL/min), Unknown PD-L1 Expression Therapeutic Status: Advanced/Metastatic Disease Line of Therapy: First Line Prior Platinum-Based Therapy<= No Renal Function: Poor Renal Function (CrCl < 50 mL/min) PD-L1 Expression Status: Unknown PD-L1 Expression Intent of Therapy: Non-Curative / Palliative Intent, Discussed with Patient 

## 2020-09-17 DIAGNOSIS — C674 Malignant neoplasm of posterior wall of bladder: Secondary | ICD-10-CM | POA: Diagnosis not present

## 2020-09-18 ENCOUNTER — Other Ambulatory Visit: Payer: Self-pay

## 2020-09-18 ENCOUNTER — Inpatient Hospital Stay: Payer: PPO

## 2020-09-18 ENCOUNTER — Encounter: Payer: Self-pay | Admitting: Oncology

## 2020-09-18 NOTE — Progress Notes (Signed)
Met with patient at registration to introduce myself as Arboriculturist and to offer available resources.  Discussed one-time $1000 Radio broadcast assistant to assist with personal expenses while going through treatment.  Gave him my card if interested in applying and advised what is needed to apply at next visit.

## 2020-09-19 ENCOUNTER — Other Ambulatory Visit: Payer: Self-pay | Admitting: Student

## 2020-09-23 ENCOUNTER — Other Ambulatory Visit: Payer: Self-pay

## 2020-09-23 ENCOUNTER — Ambulatory Visit (HOSPITAL_COMMUNITY)
Admission: RE | Admit: 2020-09-23 | Discharge: 2020-09-23 | Disposition: A | Discharge status: Home / Self Care | Payer: PPO | Source: Ambulatory Visit | Attending: Oncology | Admitting: Oncology

## 2020-09-23 ENCOUNTER — Other Ambulatory Visit: Payer: Self-pay | Admitting: Oncology

## 2020-09-23 ENCOUNTER — Encounter (HOSPITAL_COMMUNITY): Payer: Self-pay

## 2020-09-23 DIAGNOSIS — R7303 Prediabetes: Secondary | ICD-10-CM | POA: Diagnosis not present

## 2020-09-23 DIAGNOSIS — Z79899 Other long term (current) drug therapy: Secondary | ICD-10-CM | POA: Insufficient documentation

## 2020-09-23 DIAGNOSIS — I10 Essential (primary) hypertension: Secondary | ICD-10-CM | POA: Diagnosis not present

## 2020-09-23 DIAGNOSIS — C679 Malignant neoplasm of bladder, unspecified: Secondary | ICD-10-CM

## 2020-09-23 DIAGNOSIS — Z87891 Personal history of nicotine dependence: Secondary | ICD-10-CM | POA: Diagnosis not present

## 2020-09-23 DIAGNOSIS — E785 Hyperlipidemia, unspecified: Secondary | ICD-10-CM | POA: Diagnosis not present

## 2020-09-23 DIAGNOSIS — Z452 Encounter for adjustment and management of vascular access device: Secondary | ICD-10-CM | POA: Diagnosis not present

## 2020-09-23 HISTORY — PX: IR IMAGING GUIDED PORT INSERTION: IMG5740

## 2020-09-23 MED ORDER — MIDAZOLAM HCL 2 MG/2ML IJ SOLN
INTRAMUSCULAR | Status: AC
  Filled 2020-09-23: qty 4

## 2020-09-23 MED ORDER — CEFAZOLIN SODIUM-DEXTROSE 2-4 GM/100ML-% IV SOLN
INTRAVENOUS | Status: AC
  Administered 2020-09-23: 2 g via INTRAVENOUS
  Filled 2020-09-23: qty 100

## 2020-09-23 MED ORDER — FENTANYL CITRATE (PF) 100 MCG/2ML IJ SOLN
INTRAMUSCULAR | Status: AC | PRN
  Administered 2020-09-23: 50 ug via INTRAVENOUS
  Administered 2020-09-23: 25 ug via INTRAVENOUS

## 2020-09-23 MED ORDER — LIDOCAINE-EPINEPHRINE 1 %-1:100000 IJ SOLN
INTRAMUSCULAR | Status: AC
  Filled 2020-09-23: qty 1

## 2020-09-23 MED ORDER — SODIUM CHLORIDE 0.9 % IV SOLN: INTRAVENOUS | Status: DC

## 2020-09-23 MED ORDER — FENTANYL CITRATE (PF) 100 MCG/2ML IJ SOLN
INTRAMUSCULAR | Status: AC
  Filled 2020-09-23: qty 2

## 2020-09-23 MED ORDER — MIDAZOLAM HCL 2 MG/2ML IJ SOLN
INTRAMUSCULAR | Status: AC | PRN
  Administered 2020-09-23: 0.5 mg via INTRAVENOUS
  Administered 2020-09-23: 1 mg via INTRAVENOUS

## 2020-09-23 MED ORDER — LIDOCAINE-EPINEPHRINE 1 %-1:100000 IJ SOLN
INTRAMUSCULAR | Status: AC | PRN
  Administered 2020-09-23: 10 mL

## 2020-09-23 MED ORDER — CEFAZOLIN SODIUM-DEXTROSE 2-4 GM/100ML-% IV SOLN: 2.0000 g | INTRAVENOUS | Status: AC

## 2020-09-23 MED ORDER — HEPARIN SOD (PORK) LOCK FLUSH 100 UNIT/ML IV SOLN
INTRAVENOUS | Status: AC | PRN
  Administered 2020-09-23: 500 [IU] via INTRAVENOUS

## 2020-09-23 MED ORDER — HEPARIN SOD (PORK) LOCK FLUSH 100 UNIT/ML IV SOLN
INTRAVENOUS | Status: AC
  Filled 2020-09-23: qty 5

## 2020-09-23 NOTE — Discharge Instructions (Signed)

## 2020-09-23 NOTE — Procedures (Signed)
Pre Procedure Dx: Poor venous access Post Procedural Dx: Same  Successful placement of right IJ approach port-a-cath with tip at the superior caval atrial junction. The catheter is ready for immediate use.  Estimated Blood Loss: Minimal  Complications: None immediate.  Jay Elchanan Bob, MD Pager #: [phone]   

## 2020-09-23 NOTE — Consult Note (Signed)
Chief Complaint: Patient was seen in consultation today for port a cath placement  Referring Physician(s): Wyatt Portela  Supervising Physician: Sandi Mariscal  Patient Status: [facility] - Out-pt  History of Present Illness: Blake Peterson is a 80 y.o. 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  Past Medical History:  Diagnosis Date  . Basal cell carcinoma 12/2016   R anterior tibia  . Cataract   . Diverticulosis of colon   . History of colonic polyps   . HLD (hyperlipidemia)   . HTN (hypertension)   . Hypertensive retinopathy of both eyes, grade 1    [name]  . 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 Pre-diabetes   . Prediabetes   . 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 CATARACT EXTRACTION, BILATERAL  06/2017  . COLONOSCOPY  11/2012   mild-mod diverticulosis, int hem, rec rpt 5 yrs Fuller Plan)  . COLONOSCOPY  01/2018   2 TA, mod diverticulosis, rpt 5 yrs Fuller Plan)  . CYSTOSCOPY WITH INJECTION N/A 08/30/2020   Procedure: CYSTOSCOPY WITH INJECTION;  Surgeon: Alexis Frock, MD;  Location: [facility] ORS;  Service: Urology;  Laterality: N/A;  . LAPAROSCOPY  08/30/2020   Procedure: LAPAROSCOPY WITH PELVIC LYMPH NODE DISSECTION, LEFT URETERAL LYSIS;  Surgeon: Alexis Frock, MD;  Location: [facility];  Service: Urology;;  . POLYPECTOMY    . TRANSURETHRAL RESECTION OF BLADDER TUMOR N/A 06/12/2019   Procedure: TRANSURETHRAL RESECTION OF BLADDER TUMOR (TURBT) WITH GEMCITABINE IN PACU;  Surgeon: Lucas Mallow, MD;  Location: [facility];  Service: Urology;  Laterality: N/A;  . TRANSURETHRAL RESECTION OF BLADDER TUMOR N/A 07/14/2019   Procedure: TRANSURETHRAL RESECTION OF BLADDER TUMOR (TURBT);  Surgeon: Lucas Mallow, MD;  Location: [facility];  Service: Urology;   Laterality: N/A;  . UMBILICAL HERNIA REPAIR  01/07/04    Allergies: Naprosyn [naproxen]  Medications: Prior to Admission medications   Medication Sig Start Date End Date Taking? Authorizing Provider  acetaminophen (TYLENOL) 500 MG tablet Take 1 tablet (500 mg total) by mouth every 6 (six) hours as needed for moderate pain. 03/10/19   Ria Bush, MD  atenolol (TENORMIN) 50 MG tablet TAKE 1 TABLET BY MOUTH EVERY DAY Patient taking differently: Take 50 mg by mouth daily. 10/05/19   Ria Bush, MD  atorvastatin (LIPITOR) 80 MG tablet TAKE 1 TABLET BY MOUTH EVERYDAY AT BEDTIME Patient taking differently: Take 80 mg by mouth at bedtime. 10/05/19   Ria Bush, MD  chlorthalidone (HYGROTON) 25 MG tablet TAKE 1 TABLET BY MOUTH EVERY DAY Patient taking differently: Take 25 mg by mouth daily. 11/20/19   Ria Bush, MD  Cholecalciferol (VITAMIN D-3 PO) Take 50 mcg by mouth daily.     [provider]  enalapril (VASOTEC) 10 MG tablet TAKE 1 TABLET BY MOUTH TWICE A DAY Patient taking differently: Take 10 mg by mouth 2 (two) times daily. 04/16/20   Ria Bush, MD  folic acid (FOLVITE) 1 MG tablet Take 1 mg by mouth daily. 04/01/19   [provider]  HYDROcodone-acetaminophen (NORCO) 5-325 MG tablet Take 1-2 tablets by mouth every 6 (six) hours as needed for moderate pain. 08/30/20   Dancy, Amanda, PA-C  KLOR-CON M10 10 MEQ tablet TAKE 1 TABLET BY MOUTH EVERY DAY Patient taking differently: Take 10 mEq by  mouth daily. 05/21/20   Ria Bush, MD  lidocaine-prilocaine (EMLA) cream Apply 1 application topically as needed. 09/11/20   Wyatt Portela, MD  methotrexate (RHEUMATREX) 2.5 MG tablet Take 20 mg by mouth once a week. Caution:Chemotherapy. Protect from light. Thursday    [provider]  Omega-3 Fatty Acids (FISH OIL PO) Take 300 mg by mouth daily.     [provider]  predniSONE (DELTASONE) 5 MG tablet Take 5 mg by mouth daily as  needed (arthritis flare).     [provider]  prochlorperazine (COMPAZINE) 10 MG tablet Take 1 tablet (10 mg total) by mouth every 6 (six) hours as needed for nausea or vomiting. 09/11/20   Wyatt Portela, MD  tamsulosin (FLOMAX) 0.4 MG CAPS capsule Take 0.4 mg by mouth at bedtime as needed (Urinartion problem). after supper 05/24/19   [provider]  vitamin B-12 (CYANOCOBALAMIN) 1000 MCG tablet Take 1,000 mcg by mouth daily.    [provider]     Family History  Problem Relation Age of Onset  . Hypertension Sister   . Colon cancer Sister 74  . Colon polyps Sister   . Heart attack Father 6  . Esophageal cancer Neg Hx   . Rectal cancer Neg Hx   . Stomach cancer Neg Hx     Social History   Socioeconomic History  . Marital status: Married    Spouse name: Not on file  . Number of children: 3  . Years of education: Not on file  . Highest education level: Not on file  Occupational History  . 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 Smoking status: Former Smoker    Quit date: 11/10/1984    Years since quitting: 35.8  . Smokeless tobacco: Never Used  Vaping Use  . Vaping Use: Never used  Substance and Sexual Activity  . Alcohol use: Not Currently    Alcohol/week: 0.0 standard drinks  . Drug use: No  . Sexual activity: Never  Other Topics Concern  . Not on file  Social History Narrative   R handed   Lives with wife in single story home   Occupation: retired, was Clinical biochemist   Activity: enoys golfing, tries walking 3x/wk   Diet: good water, fruits/vegetables daily      Tested at risk of OSA during hospitalization (06/2019)   Social Determinants of Health   Financial Resource Strain: Low Risk   . Difficulty of Paying Living Expenses: Not hard at all  Food Insecurity: No Food Insecurity  . Worried About Charity fundraiser in the Last Year: Never true  . Ran Out of Food in the Last Year: Never true  Transportation  Needs: No Transportation Needs  . Lack of Transportation (Medical): No  . Lack of Transportation (Non-Medical): No  Physical Activity: Inactive  . Days of Exercise per Week: 0 days  . Minutes of Exercise per Session: 0 min  Stress: No Stress Concern Present  . Feeling of Stress : Not at all  Social Connections: Not on file      Review of Systems denies fever, headache, chest pain, dyspnea, cough, abdominal/back pain, nausea, vomiting or bleeding  Vital Signs:pending   Physical Exam awake, alert.  Chest clear to auscultation bilaterally.  Heart with regular rate and rhythm.  Abdomen soft, positive bowel sounds, nontender.  No lower extremity edema.  Imaging: No results found.  Labs:  CBC: Recent Labs    09/26/19 0800 06/19/20 1002 08/29/20  1032 08/30/20 1125 08/31/20 0601  WBC 12.3* 10.4 13.5*  --   --   HGB 14.3 12.7* 12.3* 12.6* 10.7*  HCT 43.1 40.6 37.9* 39.3 32.8*  PLT 314.0 289 224  --   --     COAGS: No results for input(s): INR, APTT in the last 8760 hours.  BMP: Recent Labs    06/19/20 1002 08/30/20 1034 08/31/20 0601 09/01/20 0534  NA 140 132* 134* 136  K 4.4 5.0 4.7 3.8  CL 105 105 106 104  CO2 26 20* 19* 22  GLUCOSE 166* 144* 158* 118*  BUN 34* 21 31* 24*  CALCIUM 9.7 8.9 8.8* 9.2  CREATININE 2.34* 1.10 1.69* 1.43*  GFRNONAA 26* >60 41* 50*  GFRAA 30*  --   --   --     LIVER FUNCTION TESTS: Recent Labs    09/26/19 0800 06/19/20 1002 08/30/20 1034  BILITOT 0.5 0.6 1.1  AST 18 20 29   ALT 21 24 18   ALKPHOS 89 112 76  PROT 6.9 8.1 6.5  ALBUMIN 4.2 3.2* 3.4*    TUMOR MARKERS: No results for input(s): AFPTM, CEA, CA199, CHROMGRNA in the last 8760 hours.  Assessment and Plan: 80 y.o. 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  All of the patient's questions were answered, patient is agreeable to proceed. Consent signed and in chart.     Thank you for this interesting consult.  I greatly enjoyed meeting Blake Peterson and look forward to participating in their care.  A copy of this report was sent to the requesting provider on this date.  Electronically Signed: D. Rowe Robert, PA-C 09/23/2020, 10:26 AM   I spent a total of  25 minutes   in face to face in clinical consultation, greater than 50% of which was counseling/coordinating care for Port-A-Cath placement

## 2020-09-24 NOTE — Progress Notes (Signed)
Pharmacist Chemotherapy Monitoring - Initial Assessment    Anticipated start date: 10/01/20  Regimen:  . Are orders appropriate based on the patient's diagnosis, regimen, and cycle? Yes . Does the plan date match the patient's scheduled date? Yes . Is the sequencing of drugs appropriate? Yes . Are the premedications appropriate for the patient's regimen? Yes . Prior Authorization for treatment is: Approved o If applicable, is the correct biosimilar selected based on the patient's insurance? not applicable  Organ Function and Labs: Marland Kitchen Are dose adjustments needed based on the patient's renal function, hepatic function, or hematologic function? Yes . Are appropriate labs ordered prior to the start of patient's treatment? Yes . Other organ system assessment, if indicated: N/A . The following baseline labs, if indicated, have been ordered: N/A  Dose Assessment: . Are the drug doses appropriate? Yes . Are the following correct: o Drug concentrations Yes o IV fluid compatible with drug Yes o Administration routes Yes o Timing of therapy Yes . If applicable, does the patient have documented access for treatment and/or plans for port-a-cath placement? yes . If applicable, have lifetime cumulative doses been properly documented and assessed? not applicable Lifetime Dose Tracking  No doses have been documented on this patient for the following tracked chemicals: Doxorubicin, Epirubicin, Idarubicin, Daunorubicin, Mitoxantrone, Bleomycin, Oxaliplatin, Carboplatin, Liposomal Doxorubicin  o   Toxicity Monitoring/Prevention: . The patient has the following take home antiemetics prescribed: Prochlorperazine . The patient has the following take home medications prescribed: N/A . Medication allergies and previous infusion related reactions, if applicable, have been reviewed and addressed. Yes . The patient's current medication list has been assessed for drug-drug interactions with their chemotherapy  regimen. no significant drug-drug interactions were identified on review.  Order Review: . Are the treatment plan orders signed? Yes . Is the patient scheduled to see a provider prior to their treatment? Yes  I verify that I have reviewed each item in the above checklist and answered each question accordingly.  Philomena Course 09/24/2020 11:18 AM

## 2020-09-26 ENCOUNTER — Ambulatory Visit: Payer: PPO

## 2020-09-26 ENCOUNTER — Other Ambulatory Visit: Payer: PPO

## 2020-10-01 ENCOUNTER — Other Ambulatory Visit: Payer: Self-pay

## 2020-10-01 ENCOUNTER — Inpatient Hospital Stay: Payer: PPO

## 2020-10-01 ENCOUNTER — Other Ambulatory Visit: Payer: Self-pay | Admitting: Hematology

## 2020-10-01 ENCOUNTER — Inpatient Hospital Stay: Payer: PPO | Attending: Oncology

## 2020-10-01 ENCOUNTER — Telehealth: Payer: Self-pay | Admitting: Hematology

## 2020-10-01 VITALS — BP 136/71 | HR 70 | Temp 98.9°F | Resp 20

## 2020-10-01 DIAGNOSIS — Z452 Encounter for adjustment and management of vascular access device: Secondary | ICD-10-CM | POA: Insufficient documentation

## 2020-10-01 DIAGNOSIS — Z79899 Other long term (current) drug therapy: Secondary | ICD-10-CM | POA: Insufficient documentation

## 2020-10-01 DIAGNOSIS — N1330 Unspecified hydronephrosis: Secondary | ICD-10-CM | POA: Diagnosis not present

## 2020-10-01 DIAGNOSIS — Z95828 Presence of other vascular implants and grafts: Secondary | ICD-10-CM

## 2020-10-01 DIAGNOSIS — R7989 Other specified abnormal findings of blood chemistry: Secondary | ICD-10-CM

## 2020-10-01 DIAGNOSIS — C679 Malignant neoplasm of bladder, unspecified: Secondary | ICD-10-CM | POA: Insufficient documentation

## 2020-10-01 DIAGNOSIS — N179 Acute kidney failure, unspecified: Secondary | ICD-10-CM | POA: Insufficient documentation

## 2020-10-01 DIAGNOSIS — Z5111 Encounter for antineoplastic chemotherapy: Secondary | ICD-10-CM | POA: Insufficient documentation

## 2020-10-01 LAB — CBC WITH DIFFERENTIAL (CANCER CENTER ONLY)
Abs Immature Granulocytes: 0.27 K/uL — ABNORMAL HIGH (ref 0.00–0.07)
Basophils Absolute: 0.1 K/uL (ref 0.0–0.1)
Basophils Relative: 1 %
Eosinophils Absolute: 0.2 K/uL (ref 0.0–0.5)
Eosinophils Relative: 2 %
HCT: 38.2 % — ABNORMAL LOW (ref 39.0–52.0)
Hemoglobin: 12.2 g/dL — ABNORMAL LOW (ref 13.0–17.0)
Immature Granulocytes: 2 %
Lymphocytes Relative: 13 %
Lymphs Abs: 1.7 K/uL (ref 0.7–4.0)
MCH: 30.1 pg (ref 26.0–34.0)
MCHC: 31.9 g/dL (ref 30.0–36.0)
MCV: 94.3 fL (ref 80.0–100.0)
Monocytes Absolute: 1.3 K/uL — ABNORMAL HIGH (ref 0.1–1.0)
Monocytes Relative: 10 %
Neutro Abs: 8.9 K/uL — ABNORMAL HIGH (ref 1.7–7.7)
Neutrophils Relative %: 72 %
Platelet Count: 265 K/uL (ref 150–400)
RBC: 4.05 MIL/uL — ABNORMAL LOW (ref 4.22–5.81)
RDW: 14 % (ref 11.5–15.5)
WBC Count: 12.4 K/uL — ABNORMAL HIGH (ref 4.0–10.5)
nRBC: 0 % (ref 0.0–0.2)

## 2020-10-01 LAB — CMP (CANCER CENTER ONLY)
ALT: 20 U/L (ref 0–44)
AST: 21 U/L (ref 15–41)
Albumin: 3.2 g/dL — ABNORMAL LOW (ref 3.5–5.0)
Alkaline Phosphatase: 104 U/L (ref 38–126)
Anion gap: 8 (ref 5–15)
BUN: 54 mg/dL — ABNORMAL HIGH (ref 8–23)
CO2: 13 mmol/L — ABNORMAL LOW (ref 22–32)
Calcium: 9.7 mg/dL (ref 8.9–10.3)
Chloride: 113 mmol/L — ABNORMAL HIGH (ref 98–111)
Creatinine: 3.3 mg/dL (ref 0.61–1.24)
GFR, Estimated: 19 mL/min — ABNORMAL LOW (ref >60)
Glucose, Bld: 121 mg/dL — ABNORMAL HIGH (ref 70–99)
Potassium: 5.9 mmol/L — ABNORMAL HIGH (ref 3.5–5.1)
Sodium: 134 mmol/L — ABNORMAL LOW (ref 135–145)
Total Bilirubin: 0.4 mg/dL (ref 0.3–1.2)
Total Protein: 8 g/dL (ref 6.5–8.1)

## 2020-10-01 LAB — URINALYSIS, COMPLETE (UACMP) WITH MICROSCOPIC
Bilirubin Urine: NEGATIVE
Glucose, UA: NEGATIVE mg/dL
Ketones, ur: NEGATIVE mg/dL
Nitrite: NEGATIVE
Protein, ur: 30 mg/dL — AB
RBC / HPF: >50 RBC/hpf — ABNORMAL HIGH (ref 0–5)
Specific Gravity, Urine: 1.017 (ref 1.005–1.030)
pH: 5 (ref 5.0–8.0)

## 2020-10-01 MED ORDER — HEPARIN SOD (PORK) LOCK FLUSH 100 UNIT/ML IV SOLN
500.0000 [IU] | INTRAVENOUS | Status: AC | PRN
  Administered 2020-10-01: 500 [IU]
  Filled 2020-10-01: qty 5

## 2020-10-01 MED ORDER — SODIUM CHLORIDE 0.9 % IV SOLN
INTRAVENOUS | Status: AC
  Filled 2020-10-01 (×2): qty 250

## 2020-10-01 MED ORDER — SODIUM CHLORIDE 0.9% FLUSH
10.0000 mL | INTRAVENOUS | Status: AC | PRN
  Administered 2020-10-01: 10 mL
  Filled 2020-10-01: qty 10

## 2020-10-01 MED ORDER — SODIUM CHLORIDE 0.9% FLUSH
10.0000 mL | INTRAVENOUS | Status: DC | PRN
  Administered 2020-10-01: 10 mL via INTRAVENOUS
  Filled 2020-10-01: qty 10

## 2020-10-01 NOTE — Telephone Encounter (Signed)
Pt came in today for scheduled first cycle chemo carboplatin and gemcitabien. Lab showed significant worsening renal function with Cr 3.30 (from 1.43 a month ago), and K 5.9. Chart including his med list reviewed, and I saw pt in the infusion room. He denied any acute illness, no fever, significant pain or other new symptoms. He does have urinary frequency which is related to his bladder cancer, and had a few episodes of mild hematuria. He drinks fluids about 30oz a day. He is on oral K supplement which I told him to stop, not on diuretics. I held his chemo today and give him 1L NS over 2 hrs, will check UA and urine culture today. Will schedule lab, f/u with Dr. Alen Blew within next one week. Pt voiced good understanding and agreed with the plan.  Truitt Merle  10/01/2020

## 2020-10-01 NOTE — Patient Instructions (Signed)
Rehydration, Adult Rehydration is the replacement of body fluids, salts, and minerals (electrolytes) that are lost during dehydration. Dehydration is when there is not enough water or other fluids in the body. This happens when you lose more fluids than you take in. Common causes of dehydration include:  Not drinking enough fluids. This can occur when you are ill or doing activities that require a lot of energy, especially in hot weather.  Conditions that cause loss of water or other fluids, such as diarrhea, vomiting, sweating, or urinating a lot.  Other illnesses, such as fever or infection.  Certain medicines, such as those that remove excess fluid from the body (diuretics). Symptoms of mild or moderate dehydration may include thirst, dry lips and mouth, and dizziness. Symptoms of severe dehydration may include increased heart rate, confusion, fainting, and not urinating. For severe dehydration, you may need to get fluids through an IV at the hospital. For mild or moderate dehydration, you can usually rehydrate at home by drinking certain fluids as told by your health care provider. What are the risks? Generally, rehydration is safe. However, taking in too much fluid (overhydration) can be a problem. This is rare. Overhydration can cause an electrolyte imbalance, kidney failure, or a decrease in salt (sodium) levels in the body. Supplies needed You will need an oral rehydration solution (ORS) if your health care provider tells you to use one. This is a drink to treat dehydration. It can be found in pharmacies and retail stores. How to rehydrate Fluids Follow instructions from your health care provider for rehydration. The kind of fluid and the amount you should drink depend on your condition. In general, you should choose drinks that you prefer.  If told by your health care provider, drink an ORS. ? Make an ORS by following instructions on the package. ? Start by drinking small amounts,  about  cup (120 mL) every 5-10 minutes. ? Slowly increase how much you drink until you have taken the amount recommended by your health care provider.  Drink enough clear fluids to keep your urine pale yellow. If you were told to drink an ORS, finish it first, then start slowly drinking other clear fluids. Drink fluids such as: ? Water. This includes sparkling water and flavored water. Drinking only water can lead to having too little sodium in your body (hyponatremia). Follow the advice of your health care provider. ? Water from ice chips you suck on. ? Fruit juice with water you add to it (diluted). ? Sports drinks. ? Hot or cold herbal teas. ? Broth-based soups. ? Milk or milk products. Food Follow instructions from your health care provider about what to eat while you rehydrate. Your health care provider may recommend that you slowly begin eating regular foods in small amounts.  Eat foods that contain a healthy balance of electrolytes, such as bananas, oranges, potatoes, tomatoes, and spinach.  Avoid foods that are greasy or contain a lot of sugar. In some cases, you may get nutrition through a feeding tube that is passed through your nose and into your stomach (nasogastric tube, or NG tube). This may be done if you have uncontrolled vomiting or diarrhea.   Beverages to avoid Certain beverages may make dehydration worse. While you rehydrate, avoid drinking alcohol.   How to tell if you are recovering from dehydration You may be recovering from dehydration if:  You are urinating more often than before you started rehydrating.  Your urine is pale yellow.  Your energy level   improves.  You vomit less frequently.  You have diarrhea less frequently.  Your appetite improves or returns to normal.  You feel less dizzy or less light-headed.  Your skin tone and color start to look more normal. Follow these instructions at home:  Take over-the-counter and prescription medicines only  as told by your health care provider.  Do not take sodium tablets. Doing this can lead to having too much sodium in your body (hypernatremia). Contact a health care provider if:  You continue to have symptoms of mild or moderate dehydration, such as: ? Thirst. ? Dry lips. ? Slightly dry mouth. ? Dizziness. ? Dark urine or less urine than normal. ? Muscle cramps.  You continue to vomit or have diarrhea. Get help right away if you:  Have symptoms of dehydration that get worse.  Have a fever.  Have a severe headache.  Have been vomiting and the following happens: ? Your vomiting gets worse or does not go away. ? Your vomit includes blood or green matter (bile). ? You cannot eat or drink without vomiting.  Have problems with urination or bowel movements, such as: ? Diarrhea that gets worse or does not go away. ? Blood in your stool (feces). This may cause stool to look black and tarry. ? Not urinating, or urinating only a small amount of very dark urine, within 6-8 hours.  Have trouble breathing.  Have symptoms that get worse with treatment. These symptoms may represent a serious problem that is an emergency. Do not wait to see if the symptoms will go away. Get medical help right away. Call your local emergency services (911 in the [place] Do not drive yourself to the hospital. Summary  Rehydration is the replacement of body fluids and minerals (electrolytes) that are lost during dehydration.  Follow instructions from your health care provider for rehydration. The kind of fluid and amount you should drink depend on your condition.  Slowly increase how much you drink until you have taken the amount recommended by your health care provider.  Contact your health care provider if you continue to show signs of mild or moderate dehydration. This information is not intended to replace advice given to you by your health care provider. Make sure you discuss any questions you have with  your health care provider. Document Revised: 11/08/2019 Document Reviewed: 09/18/2019 Elsevier Patient Education  2021 [employer] Inc[employer]  

## 2020-10-01 NOTE — Patient Instructions (Signed)

## 2020-10-01 NOTE — Progress Notes (Signed)
CRITICAL VALUE STICKER  CRITICAL VALUE:Creatinine 3.30  RECEIVER (on-site recipient of call):Suleman Gunning  DATE & TIME NOTIFIED: 10/01/20 12:44  MESSENGER (representative from lab):Rosann Auerbach  MD NOTIFIED: Alen Blew  TIME OF NOTIFICATION:12:46  RESPONSE: MD aware via Carlyon Prows

## 2020-10-01 NOTE — Progress Notes (Signed)
Critical Value: Creatinine 3.30 Per Dr Burr Medico no chemo today, give 1L NSS over 2 hours schedule  Lab appt and F/U with Dr Alen Blew this week. High priority scheduling message sent

## 2020-10-02 ENCOUNTER — Other Ambulatory Visit: Payer: Self-pay | Admitting: Oncology

## 2020-10-02 DIAGNOSIS — C679 Malignant neoplasm of bladder, unspecified: Secondary | ICD-10-CM

## 2020-10-02 NOTE — Progress Notes (Signed)
Events in the last 24 hours noted.  His creatinine was noted to have increase in the last month with a significant increase of his BUN and creatinine up to 3.3 and a creatinine clearance down to 19 cc/min.  Chemotherapy will were withheld for the time being till etiology is clarified.  His findings are suspicious for likely hydronephrosis and obstructive cause.  We will evaluate urgently with imaging studies and potentially he might require a nephrostomy tube to relieve his obstruction.   This was discussed with the patient today and he is agreeable with this plan.  Chemotherapy will be tentatively delayed till next week.

## 2020-10-03 ENCOUNTER — Encounter: Payer: PPO | Admitting: Family Medicine

## 2020-10-03 ENCOUNTER — Other Ambulatory Visit: Payer: Self-pay

## 2020-10-03 ENCOUNTER — Ambulatory Visit (HOSPITAL_COMMUNITY)
Admission: RE | Admit: 2020-10-03 | Discharge: 2020-10-03 | Disposition: A | Discharge status: Home / Self Care | Payer: PPO | Source: Ambulatory Visit | Attending: Oncology | Admitting: Oncology

## 2020-10-03 DIAGNOSIS — N281 Cyst of kidney, acquired: Secondary | ICD-10-CM | POA: Diagnosis not present

## 2020-10-03 DIAGNOSIS — C679 Malignant neoplasm of bladder, unspecified: Secondary | ICD-10-CM | POA: Diagnosis not present

## 2020-10-03 DIAGNOSIS — N1330 Unspecified hydronephrosis: Secondary | ICD-10-CM | POA: Diagnosis not present

## 2020-10-03 LAB — URINE CULTURE

## 2020-10-03 MED ORDER — IOHEXOL 9 MG/ML PO SOLN
ORAL | Status: AC
  Filled 2020-10-03: qty 1000

## 2020-10-06 ENCOUNTER — Other Ambulatory Visit: Payer: Self-pay | Admitting: Family Medicine

## 2020-10-08 ENCOUNTER — Other Ambulatory Visit: Payer: Self-pay | Admitting: Family Medicine

## 2020-10-08 ENCOUNTER — Other Ambulatory Visit: Payer: PPO

## 2020-10-08 ENCOUNTER — Inpatient Hospital Stay: Payer: PPO

## 2020-10-08 ENCOUNTER — Inpatient Hospital Stay: Payer: PPO | Admitting: Oncology

## 2020-10-09 ENCOUNTER — Inpatient Hospital Stay: Payer: PPO | Admitting: Oncology

## 2020-10-09 ENCOUNTER — Telehealth: Payer: Self-pay

## 2020-10-09 ENCOUNTER — Other Ambulatory Visit: Payer: Self-pay

## 2020-10-09 ENCOUNTER — Inpatient Hospital Stay: Payer: PPO

## 2020-10-09 ENCOUNTER — Encounter (HOSPITAL_COMMUNITY): Payer: Self-pay | Admitting: Emergency Medicine

## 2020-10-09 ENCOUNTER — Inpatient Hospital Stay (HOSPITAL_COMMUNITY)
Admission: EM | Admit: 2020-10-09 | Discharge: 2020-10-12 | DRG: 683 | Disposition: A | Discharge status: Home Health | Payer: PPO | Source: Ambulatory Visit | Attending: Family Medicine | Admitting: Family Medicine

## 2020-10-09 VITALS — BP 130/63 | HR 82 | Temp 98.2°F | Resp 20 | Ht 68.0 in | Wt 182.4 lb

## 2020-10-09 DIAGNOSIS — N179 Acute kidney failure, unspecified: Principal | ICD-10-CM | POA: Diagnosis present

## 2020-10-09 DIAGNOSIS — Z7189 Other specified counseling: Secondary | ICD-10-CM | POA: Diagnosis not present

## 2020-10-09 DIAGNOSIS — C679 Malignant neoplasm of bladder, unspecified: Secondary | ICD-10-CM | POA: Diagnosis present

## 2020-10-09 DIAGNOSIS — I129 Hypertensive chronic kidney disease with stage 1 through stage 4 chronic kidney disease, or unspecified chronic kidney disease: Secondary | ICD-10-CM | POA: Diagnosis present

## 2020-10-09 DIAGNOSIS — Z20822 Contact with and (suspected) exposure to covid-19: Secondary | ICD-10-CM | POA: Diagnosis present

## 2020-10-09 DIAGNOSIS — I10 Essential (primary) hypertension: Secondary | ICD-10-CM | POA: Diagnosis present

## 2020-10-09 DIAGNOSIS — R310 Gross hematuria: Secondary | ICD-10-CM | POA: Diagnosis present

## 2020-10-09 DIAGNOSIS — B9689 Other specified bacterial agents as the cause of diseases classified elsewhere: Secondary | ICD-10-CM | POA: Diagnosis present

## 2020-10-09 DIAGNOSIS — Z95828 Presence of other vascular implants and grafts: Secondary | ICD-10-CM

## 2020-10-09 DIAGNOSIS — N136 Pyonephrosis: Secondary | ICD-10-CM | POA: Diagnosis not present

## 2020-10-09 DIAGNOSIS — R350 Frequency of micturition: Secondary | ICD-10-CM | POA: Diagnosis present

## 2020-10-09 DIAGNOSIS — C775 Secondary and unspecified malignant neoplasm of intrapelvic lymph nodes: Secondary | ICD-10-CM | POA: Diagnosis not present

## 2020-10-09 DIAGNOSIS — N189 Chronic kidney disease, unspecified: Secondary | ICD-10-CM | POA: Diagnosis not present

## 2020-10-09 DIAGNOSIS — N139 Obstructive and reflux uropathy, unspecified: Secondary | ICD-10-CM | POA: Diagnosis not present

## 2020-10-09 DIAGNOSIS — Z87891 Personal history of nicotine dependence: Secondary | ICD-10-CM

## 2020-10-09 DIAGNOSIS — N132 Hydronephrosis with renal and ureteral calculous obstruction: Secondary | ICD-10-CM | POA: Diagnosis not present

## 2020-10-09 DIAGNOSIS — E872 Acidosis: Secondary | ICD-10-CM | POA: Diagnosis present

## 2020-10-09 DIAGNOSIS — Z955 Presence of coronary angioplasty implant and graft: Secondary | ICD-10-CM | POA: Diagnosis not present

## 2020-10-09 DIAGNOSIS — E875 Hyperkalemia: Secondary | ICD-10-CM | POA: Diagnosis not present

## 2020-10-09 DIAGNOSIS — Z800 Family history of malignant neoplasm of digestive organs: Secondary | ICD-10-CM | POA: Diagnosis not present

## 2020-10-09 DIAGNOSIS — N1339 Other hydronephrosis: Secondary | ICD-10-CM | POA: Diagnosis not present

## 2020-10-09 DIAGNOSIS — E785 Hyperlipidemia, unspecified: Secondary | ICD-10-CM | POA: Diagnosis present

## 2020-10-09 DIAGNOSIS — R7303 Prediabetes: Secondary | ICD-10-CM | POA: Diagnosis not present

## 2020-10-09 DIAGNOSIS — Z79899 Other long term (current) drug therapy: Secondary | ICD-10-CM | POA: Diagnosis not present

## 2020-10-09 DIAGNOSIS — Z85828 Personal history of other malignant neoplasm of skin: Secondary | ICD-10-CM

## 2020-10-09 DIAGNOSIS — Z9221 Personal history of antineoplastic chemotherapy: Secondary | ICD-10-CM

## 2020-10-09 DIAGNOSIS — N1330 Unspecified hydronephrosis: Secondary | ICD-10-CM | POA: Diagnosis not present

## 2020-10-09 DIAGNOSIS — Z936 Other artificial openings of urinary tract status: Secondary | ICD-10-CM | POA: Diagnosis not present

## 2020-10-09 DIAGNOSIS — Z515 Encounter for palliative care: Secondary | ICD-10-CM | POA: Diagnosis not present

## 2020-10-09 LAB — URINALYSIS, ROUTINE W REFLEX MICROSCOPIC
Bilirubin Urine: NEGATIVE
Glucose, UA: NEGATIVE mg/dL
Ketones, ur: NEGATIVE mg/dL
Nitrite: NEGATIVE
Protein, ur: 100 mg/dL — AB
Specific Gravity, Urine: 1.014 (ref 1.005–1.030)
WBC, UA: >50 WBC/hpf — ABNORMAL HIGH (ref 0–5)
pH: 5 (ref 5.0–8.0)

## 2020-10-09 LAB — CBC
HCT: 39.5 % (ref 39.0–52.0)
Hemoglobin: 12.2 g/dL — ABNORMAL LOW (ref 13.0–17.0)
MCH: 30.1 pg (ref 26.0–34.0)
MCHC: 30.9 g/dL (ref 30.0–36.0)
MCV: 97.5 fL (ref 80.0–100.0)
Platelets: 298 10*3/uL (ref 150–400)
RBC: 4.05 MIL/uL — ABNORMAL LOW (ref 4.22–5.81)
RDW: 14.5 % (ref 11.5–15.5)
WBC: 14.7 10*3/uL — ABNORMAL HIGH (ref 4.0–10.5)
nRBC: 0 % (ref 0.0–0.2)

## 2020-10-09 LAB — COMPREHENSIVE METABOLIC PANEL
ALT: 24 U/L (ref 0–44)
AST: 22 U/L (ref 15–41)
Albumin: 3.4 g/dL — ABNORMAL LOW (ref 3.5–5.0)
Alkaline Phosphatase: 104 U/L (ref 38–126)
Anion gap: 14 (ref 5–15)
BUN: 88 mg/dL — ABNORMAL HIGH (ref 8–23)
CO2: 9 mmol/L — ABNORMAL LOW (ref 22–32)
Calcium: 9.4 mg/dL (ref 8.9–10.3)
Chloride: 113 mmol/L — ABNORMAL HIGH (ref 98–111)
Creatinine, Ser: 7.01 mg/dL — ABNORMAL HIGH (ref 0.61–1.24)
GFR, Estimated: 7 mL/min — ABNORMAL LOW (ref >60)
Glucose, Bld: 117 mg/dL — ABNORMAL HIGH (ref 70–99)
Potassium: 6.1 mmol/L — ABNORMAL HIGH (ref 3.5–5.1)
Sodium: 136 mmol/L (ref 135–145)
Total Bilirubin: 0.5 mg/dL (ref 0.3–1.2)
Total Protein: 7.9 g/dL (ref 6.5–8.1)

## 2020-10-09 LAB — CBC WITH DIFFERENTIAL (CANCER CENTER ONLY)
Abs Immature Granulocytes: 0.16 10*3/uL — ABNORMAL HIGH (ref 0.00–0.07)
Basophils Absolute: 0.1 10*3/uL (ref 0.0–0.1)
Basophils Relative: 1 %
Eosinophils Absolute: 0.2 10*3/uL (ref 0.0–0.5)
Eosinophils Relative: 2 %
HCT: 36.9 % — ABNORMAL LOW (ref 39.0–52.0)
Hemoglobin: 11.5 g/dL — ABNORMAL LOW (ref 13.0–17.0)
Immature Granulocytes: 1 %
Lymphocytes Relative: 11 %
Lymphs Abs: 1.4 10*3/uL (ref 0.7–4.0)
MCH: 30 pg (ref 26.0–34.0)
MCHC: 31.2 g/dL (ref 30.0–36.0)
MCV: 96.3 fL (ref 80.0–100.0)
Monocytes Absolute: 1.3 10*3/uL — ABNORMAL HIGH (ref 0.1–1.0)
Monocytes Relative: 10 %
Neutro Abs: 9.8 10*3/uL — ABNORMAL HIGH (ref 1.7–7.7)
Neutrophils Relative %: 75 %
Platelet Count: 268 10*3/uL (ref 150–400)
RBC: 3.83 MIL/uL — ABNORMAL LOW (ref 4.22–5.81)
RDW: 14.3 % (ref 11.5–15.5)
WBC Count: 12.9 10*3/uL — ABNORMAL HIGH (ref 4.0–10.5)
nRBC: 0 % (ref 0.0–0.2)

## 2020-10-09 LAB — CMP (CANCER CENTER ONLY)
ALT: 22 U/L (ref 0–44)
AST: 21 U/L (ref 15–41)
Albumin: 2.9 g/dL — ABNORMAL LOW (ref 3.5–5.0)
Alkaline Phosphatase: 98 U/L (ref 38–126)
Anion gap: UNDETERMINED (ref 5–15)
BUN: 79 mg/dL — ABNORMAL HIGH (ref 8–23)
CO2: <10 mmol/L — ABNORMAL LOW (ref 22–32)
Calcium: 9.1 mg/dL (ref 8.9–10.3)
Chloride: 115 mmol/L — ABNORMAL HIGH (ref 98–111)
Creatinine: 6.66 mg/dL (ref 0.61–1.24)
GFR, Estimated: 8 mL/min — ABNORMAL LOW (ref >60)
Glucose, Bld: 138 mg/dL — ABNORMAL HIGH (ref 70–99)
Potassium: 5.8 mmol/L — ABNORMAL HIGH (ref 3.5–5.1)
Sodium: 137 mmol/L (ref 135–145)
Total Bilirubin: 0.2 mg/dL — ABNORMAL LOW (ref 0.3–1.2)
Total Protein: 7.4 g/dL (ref 6.5–8.1)

## 2020-10-09 LAB — PHOSPHORUS: Phosphorus: 7 mg/dL — ABNORMAL HIGH (ref 2.5–4.6)

## 2020-10-09 LAB — LACTIC ACID, PLASMA
Lactic Acid, Venous: 0.7 mmol/L (ref 0.5–1.9)
Lactic Acid, Venous: 0.7 mmol/L (ref 0.5–1.9)

## 2020-10-09 LAB — SARS CORONAVIRUS 2 (TAT 6-24 HRS): SARS Coronavirus 2: NEGATIVE

## 2020-10-09 LAB — MAGNESIUM: Magnesium: 1.8 mg/dL (ref 1.7–2.4)

## 2020-10-09 MED ORDER — SODIUM ZIRCONIUM CYCLOSILICATE 10 G PO PACK
10.0000 g | PACK | Freq: Three times a day (TID) | ORAL | Status: DC
  Administered 2020-10-09 (×2): 10 g via ORAL
  Filled 2020-10-09 (×2): qty 1

## 2020-10-09 MED ORDER — HYDROCODONE-ACETAMINOPHEN 5-325 MG PO TABS
1.0000 | ORAL_TABLET | Freq: Four times a day (QID) | ORAL | Status: DC | PRN
  Administered 2020-10-10 – 2020-10-12 (×4): 1 via ORAL
  Filled 2020-10-09 (×4): qty 1

## 2020-10-09 MED ORDER — SODIUM POLYSTYRENE SULFONATE 15 GM/60ML PO SUSP
30.0000 g | Freq: Once | ORAL | Status: AC
  Administered 2020-10-09: 30 g via ORAL
  Filled 2020-10-09: qty 120

## 2020-10-09 MED ORDER — SODIUM CHLORIDE 0.9 % IV BOLUS
500.0000 mL | Freq: Once | INTRAVENOUS | Status: AC
  Administered 2020-10-09: 500 mL via INTRAVENOUS

## 2020-10-09 MED ORDER — STERILE WATER FOR INJECTION IV SOLN
Freq: Once | INTRAVENOUS | Status: AC
  Filled 2020-10-09: qty 150

## 2020-10-09 MED ORDER — VITAMIN D3 25 MCG (1000 UNIT) PO TABS
2000.0000 [IU] | ORAL_TABLET | Freq: Every day | ORAL | Status: DC
  Administered 2020-10-10 – 2020-10-12 (×3): 2000 [IU] via ORAL
  Filled 2020-10-09 (×6): qty 2

## 2020-10-09 MED ORDER — FOLIC ACID 1 MG PO TABS
1.0000 mg | ORAL_TABLET | Freq: Every day | ORAL | Status: DC
  Administered 2020-10-10 – 2020-10-12 (×3): 1 mg via ORAL
  Filled 2020-10-09 (×3): qty 1

## 2020-10-09 MED ORDER — OMEGA-3-ACID ETHYL ESTERS 1 G PO CAPS
1.0000 g | ORAL_CAPSULE | Freq: Every day | ORAL | Status: DC
  Administered 2020-10-10 – 2020-10-12 (×3): 1 g via ORAL
  Filled 2020-10-09 (×3): qty 1

## 2020-10-09 MED ORDER — SODIUM CHLORIDE 0.9% FLUSH
10.0000 mL | INTRAVENOUS | Status: DC | PRN
  Administered 2020-10-09: 10 mL via INTRAVENOUS
  Filled 2020-10-09: qty 10

## 2020-10-09 MED ORDER — VITAMIN B-12 1000 MCG PO TABS
1000.0000 ug | ORAL_TABLET | Freq: Every day | ORAL | Status: DC
  Administered 2020-10-10 – 2020-10-12 (×3): 1000 ug via ORAL
  Filled 2020-10-09 (×3): qty 1

## 2020-10-09 MED ORDER — HEPARIN SOD (PORK) LOCK FLUSH 100 UNIT/ML IV SOLN
500.0000 [IU] | Freq: Once | INTRAVENOUS | Status: AC
  Administered 2020-10-09: 500 [IU] via INTRAVENOUS
  Filled 2020-10-09: qty 5

## 2020-10-09 MED ORDER — ATORVASTATIN CALCIUM 40 MG PO TABS
80.0000 mg | ORAL_TABLET | Freq: Every day | ORAL | Status: DC
  Administered 2020-10-10 – 2020-10-12 (×3): 80 mg via ORAL
  Filled 2020-10-09 (×3): qty 2

## 2020-10-09 MED ORDER — SODIUM CHLORIDE 0.9 % IV SOLN
2.0000 g | INTRAVENOUS | Status: DC
  Administered 2020-10-09 – 2020-10-11 (×3): 2 g via INTRAVENOUS
  Filled 2020-10-09: qty 20
  Filled 2020-10-09: qty 2
  Filled 2020-10-09: qty 20
  Filled 2020-10-09: qty 2

## 2020-10-09 NOTE — Telephone Encounter (Signed)
CRITICAL VALUE STICKER  CRITICAL VALUE: Creatinine 6.66  RECEIVER (on-site recipient of call): Wendall Mola, RN  [place] NOTIFIED: 10/09/20 @ 0906  MESSENGER (representative from lab): M. Nicole Kindred  MD NOTIFIED: Dr. Zola Button  TIME OF NOTIFICATION: 0910  RESPONSE: Acknowledged. Per Dr. Alen Blew-  We need to let him know his kidney function is a lot worse and the blockage (hydronephrosis) is a lot worse and cannot wait. He needs to urgently go to the emergency department to expedite intervention.  Called and spoke to patient's wife and made her aware of Dr. Hazeline Junker message. Patient's wife verbalized understanding and stated she will take patient to the ED now.

## 2020-10-09 NOTE — Progress Notes (Addendum)
Hematology and Oncology Follow Up Visit  Blake Peterson [id number] [birth year] 80 y.o. 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    Prior Therapy:  He is status post attempted radical cystectomy on August 30, 2020.  He subsequently underwent lymph node dissection and left ureteral lysis completed by Dr. Tresa Moore on August 30, 2020.  He was found to have perivesicular involvement of his tumor with metastatic disease into the left obturator and common iliac lymph nodes.  Current therapy: Under evaluation for the start of palliative chemotherapy utilizing gemcitabine and carboplatin.  Interim History: Mr. Blake Peterson is here for return evaluation.  Since the last visit, he was scheduled to start systemic chemotherapy utilizing carboplatin and gemcitabine but noted to have worsening kidney function including creatinine up to 3.3.  CT scan obtained on October 03, 2020 confirmed the presence of bilateral hydronephrosis which is slightly increased on the left.  There is also increased number of the retroperitoneal and pelvic lymph nodes.  Clinically, he reports no major complaints.  He denies any nausea, vomiting or abdominal pain.  He denies any hematuria or dysuria.  He is eating well although his mobility has been slightly limited.    Medications: Unchanged on review Current Outpatient Medications  Medication Sig Dispense Refill  . acetaminophen (TYLENOL) 500 MG tablet Take 1 tablet (500 mg total) by mouth every 6 (six) hours as needed for moderate pain.    Marland Kitchen atenolol (TENORMIN) 50 MG tablet TAKE 1 TABLET BY MOUTH EVERY DAY 90 tablet 0  . atorvastatin (LIPITOR) 80 MG tablet TAKE 1 TABLET BY MOUTH EVERYDAY AT BEDTIME 90 tablet 0  . chlorthalidone (HYGROTON) 25 MG tablet TAKE 1 TABLET BY MOUTH EVERY DAY (Patient  taking differently: Take 25 mg by mouth daily.) 90 tablet 3  . Cholecalciferol (VITAMIN D-3 PO) Take 50 mcg by mouth daily.     . enalapril (VASOTEC) 10 MG tablet TAKE 1 TABLET BY MOUTH TWICE A DAY 409 tablet 0  . folic acid (FOLVITE) 1 MG tablet Take 1 mg by mouth daily.    Marland Kitchen HYDROcodone-acetaminophen (NORCO) 5-325 MG tablet Take 1-2 tablets by mouth every 6 (six) hours as needed for moderate pain. 20 tablet 0  . KLOR-CON M10 10 MEQ tablet TAKE 1 TABLET BY MOUTH EVERY DAY (Patient taking differently: Take 10 mEq by mouth daily.) 90 tablet 1  . lidocaine-prilocaine (EMLA) cream Apply 1 application topically as needed. 30 g 0  . methotrexate (RHEUMATREX) 2.5 MG tablet Take 20 mg by mouth once a week. Caution:Chemotherapy. Protect from light. Thursday    . Omega-3 Fatty Acids (FISH OIL PO) Take 300 mg by mouth daily.     . predniSONE (DELTASONE) 5 MG tablet Take 5 mg by mouth daily as needed (arthritis flare).     . prochlorperazine (COMPAZINE) 10 MG tablet Take 1 tablet (10 mg total) by mouth every 6 (six) hours as needed for nausea or vomiting. 30 tablet 0  . tamsulosin (FLOMAX) 0.4 MG CAPS capsule Take 0.4 mg by mouth at bedtime as needed (Urinartion problem). after supper    . vitamin B-12 (CYANOCOBALAMIN) 1000 MCG tablet Take 1,000 mcg by mouth daily.     No current facility-administered medications for this visit.   Facility-Administered Medications Ordered in Other Visits  Medication Dose Route Frequency Provider Last Rate Last Admin  . sodium chloride flush (  NS) 0.9 % injection 10 mL  10 mL Intravenous PRN Wyatt Portela, MD   10 mL at 10/09/20 0813     Allergies:  Allergies  Allergen Reactions  . Naprosyn [Naproxen] Swelling    Fingers and ankles swelling      Physical Exam: Blood pressure 130/63, pulse 82, temperature 98.2 F (36.8 C), temperature source Tympanic, resp. rate 20, height 5\' 8"  (1.727 m), weight 182 lb 6.4 oz (82.7 kg), SpO2 100 %.    ECOG: 0   General  appearance: Alert, awake without any distress. Head: Atraumatic without abnormalities Oropharynx: Without any thrush or ulcers. Eyes: No scleral icterus. Lymph nodes: No lymphadenopathy noted in the cervical, supraclavicular, or axillary nodes Heart:regular rate and rhythm, without any murmurs or gallops.   Lung: Clear to auscultation without any rhonchi, wheezes or dullness to percussion. Abdomin: Soft, nontender without any shifting dullness or ascites. Musculoskeletal: No clubbing or cyanosis. Neurological: No motor or sensory deficits. Skin: No rashes or lesions.     Lab Results: Lab Results  Component Value Date   WBC 12.4 (H) 10/01/2020   HGB 12.2 (L) 10/01/2020   HCT 38.2 (L) 10/01/2020   MCV 94.3 10/01/2020   PLT 265 10/01/2020     Chemistry      Component Value Date/Time   NA 134 (L) 10/01/2020 1148   K 5.9 (H) 10/01/2020 1148   CL 113 (H) 10/01/2020 1148   CO2 13 (L) 10/01/2020 1148   BUN 54 (H) 10/01/2020 1148   CREATININE 3.30 (HH) 10/01/2020 1148      Component Value Date/Time   CALCIUM 9.7 10/01/2020 1148   ALKPHOS 104 10/01/2020 1148   AST 21 10/01/2020 1148   ALT 20 10/01/2020 1148   BILITOT 0.4 10/01/2020 1148     IMPRESSION: 1. Slightly increased soft tissue thickening of the bladder wall with bilateral hydroureteronephrosis to the level of the UVJ which is slightly increased on the left but stable on the right. Findings concerning for disease progression. 2. Increased size and number of the index and non index retroperitoneal and pelvic lymph nodes, concerning for increased nodal metastatic disease. 3. Aortic atherosclerosis. Impression and Plan:  80 year old with  1.   Bladder cancer diagnosed in September 2021 and subsequently developed stage IV high-grade urothelial carcinoma with pelvic adenopathy.  The plan is to start palliative chemotherapy with carboplatin and gemcitabine once has hydronephrosis issues has been addressed.  And we  will tentatively initiate his chemotherapy to January 25 at the latest.   2. IV access:Port-A-Cath inserted without any issues and will be in use looking forward.  3. Antiemetics: No nausea or vomiting reported.  Compazine is available to him.  4. Renal function surveillance:  He has developed acute renal failure related to hydronephrosis.  Platinum therapy has been on hold and has not started.  5. Goals of care:Therapy remains palliative at this time with aggressive measures are warranted given his reasonable performance status.  6.  Bilateral hydronephrosis: This was discussed with Dr. Tresa Moore and he will likely require nephrostomy tube placement.  We will defer that up to his discretion.  7. Follow-up:  He will return in 1 week for the start of chemotherapy.  30  minutes were dedicated to this visit.  Time was spent on reviewing his disease status, discussing treatment options and reviewing imaging studies.     Zola Button, MD 1/19/20228:24 AM    Laboratory data from today reviewed including kidney function and electrolytes.  He has  developed acute renal failure with acidosis as well as hyperkalemia that is of an urgent nature and no longer can wait at this time.  I instructed the patient to report urgently to the emergency department for evaluation and management of his acute renal failure.  Will likely require urgent urological evaluation and correction of his metabolic and electrolyte derangements.  Zola Button MD 10/09/2020

## 2020-10-09 NOTE — ED Provider Notes (Signed)
[facility] DEPT Provider Note   CSN: [id number] Arrival date & time: 10/09/20  0814     History Chief Complaint  Patient presents with  . Abnormal Lab    Blake Peterson is a 80 y.o. male.  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  Patient seen in Dr. Hazeline Junker office today.  He has quickly escalating acute renal failure over the past week creatinine went from 3.3 to 6.1.  Patient reports he has had some general fatigue.  He has urinary urgency.  No fevers.  Sometimes some chills.  Developed nausea over the past couple days no active vomiting.  At baseline, he reports he has intermittent loose, diarrheal stool.  He does note increase in urinary urgency but no pain or burning.  No significant abdominal pain or distention.    Past Medical History:  Diagnosis Date  . Basal cell carcinoma 12/2016   R anterior tibia  . Cataract   . Diverticulosis of colon   . History of colonic polyps   . HLD (hyperlipidemia)   . HTN (hypertension)   . Hypertensive retinopathy of both eyes, grade 1    [name]  . 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 Pre-diabetes   . Prediabetes   . Wears hearing aid in left ear     Patient Active Problem List   Diagnosis Date Noted  . Bladder cancer ([place]) 08/30/2020  . 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 Syncope and collapse 04/24/2019  . Weakness of both lower extremities 02/24/2019  . 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 High risk medication use 01/04/2019  . BPH (benign prostatic hyperplasia) 12/13/2018  . Thoracic aorta atherosclerosis ([place]) 06/15/2018  . 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 Overweight (BMI 25.0-29.9)   . Hypertensive retinopathy of both eyes, grade 1   . Health  maintenance examination 08/10/2014  . Goals of care, counseling/discussion 08/10/2014  . Bilateral hearing loss due to cerumen impaction 08/10/2014  . Essential tremor 09/09/2012  . Medicare annual wellness visit, subsequent 08/07/2011  . History of colonic polyps   . Diverticulosis of colon   . HTN (hypertension)   . Hyperlipidemia associated with type 2 diabetes mellitus ([place])   . Controlled type 2 diabetes mellitus without complication, without long-term current use of insulin ([place]) 08/11/2010  . Vitamin D deficiency 08/07/2010  . 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 Elevated PSA 02/16/2007    Past Surgical History:  Procedure Laterality Date  . CATARACT EXTRACTION, BILATERAL  06/2017  . COLONOSCOPY  11/2012   mild-mod diverticulosis, int hem, rec rpt 5 yrs [facility])  . COLONOSCOPY  01/2018   2 TA, mod diverticulosis, rpt 5 yrs [facility] Plan)  . CYSTOSCOPY WITH INJECTION N/A 08/30/2020   Procedure: CYSTOSCOPY WITH INJECTION;  Surgeon: Alexis Frock, MD;  Location: [facility];  Service: Urology;  Laterality: N/A;  . IR IMAGING GUIDED PORT INSERTION  09/23/2020  . LAPAROSCOPY  08/30/2020   Procedure: LAPAROSCOPY WITH PELVIC LYMPH NODE DISSECTION, LEFT URETERAL LYSIS;  Surgeon: Alexis Frock, MD;  Location: [facility];  Service: Urology;;  . POLYPECTOMY    . TRANSURETHRAL RESECTION OF BLADDER TUMOR N/A 06/12/2019   Procedure: TRANSURETHRAL RESECTION OF BLADDER TUMOR (TURBT) WITH GEMCITABINE IN PACU;  Surgeon: Lucas Mallow, MD;  Location: [facility];  Service: Urology;  Laterality: N/A;  . TRANSURETHRAL RESECTION OF BLADDER TUMOR N/A 07/14/2019   Procedure: TRANSURETHRAL RESECTION OF BLADDER TUMOR (TURBT);  Surgeon: Lucas Mallow, MD;  Location: [facility];  Service: Urology;  Laterality: N/A;  . UMBILICAL HERNIA REPAIR  01/07/04       Family History  Problem Relation Age of Onset  . Hypertension Sister   . Colon cancer Sister 36  . Colon polyps Sister   .  Heart attack Father 11  . Esophageal cancer Neg Hx   . Rectal cancer Neg Hx   . Stomach cancer Neg Hx     Social History   Tobacco Use  . Smoking status: Former Smoker    Quit date: 11/10/1984    Years since quitting: 35.9  . Smokeless tobacco: Never Used  Vaping Use  . Vaping Use: Never used  Substance Use Topics  . Alcohol use: Not Currently    Alcohol/week: 0.0 standard drinks  . Drug use: No    Home Medications Prior to Admission medications   Medication Sig Start Date End Date Taking? Authorizing Provider  acetaminophen (TYLENOL) 500 MG tablet Take 1 tablet (500 mg total) by mouth every 6 (six) hours as needed for moderate pain. Patient taking differently: Take 1,000 mg by mouth every 6 (six) hours as needed for moderate pain. 03/10/19  Yes Ria Bush, MD  atenolol (TENORMIN) 50 MG tablet TAKE 1 TABLET BY MOUTH EVERY DAY Patient taking differently: Take 50 mg by mouth daily. 10/08/20  Yes Ria Bush, MD  atorvastatin (LIPITOR) 80 MG tablet TAKE 1 TABLET BY MOUTH EVERYDAY AT BEDTIME Patient taking differently: Take 80 mg by mouth daily. 10/08/20  Yes Ria Bush, MD  chlorthalidone (HYGROTON) 25 MG tablet TAKE 1 TABLET BY MOUTH EVERY DAY Patient taking differently: Take by mouth daily. 11/20/19  Yes Ria Bush, MD  Cholecalciferol (VITAMIN D-3 PO) Take 50 mcg by mouth daily.    Yes [provider]  enalapril (VASOTEC) 10 MG tablet TAKE 1 TABLET BY MOUTH TWICE A DAY Patient taking differently: Take 10 mg by mouth 2 (two) times daily. 10/07/20  Yes Ria Bush, MD  folic acid (FOLVITE) 1 MG tablet Take 1 mg by mouth daily. 04/01/19  Yes [provider]  HYDROcodone-acetaminophen (NORCO) 5-325 MG tablet Take 1-2 tablets by mouth every 6 (six) hours as needed for moderate pain. 08/30/20  Yes Dancy, Estill Bamberg, PA-C  Omega-3 Fatty Acids (FISH OIL PO) Take 300 mg by mouth daily.    Yes [provider]  vitamin B-12 (CYANOCOBALAMIN)  1000 MCG tablet Take 1,000 mcg by mouth daily.   Yes [provider]  KLOR-CON M10 10 MEQ tablet TAKE 1 TABLET BY MOUTH EVERY DAY Patient not taking: No sig reported 05/21/20   Ria Bush, MD  lidocaine-prilocaine (EMLA) cream Apply 1 application topically as needed. Patient not taking: No sig reported 09/11/20   Wyatt Portela, MD  prochlorperazine (COMPAZINE) 10 MG tablet Take 1 tablet (10 mg total) by mouth every 6 (six) hours as needed for nausea or vomiting. Patient not taking: No sig reported 09/11/20   Wyatt Portela, MD    Allergies    Naprosyn [naproxen]  Review of Systems   Review of Systems 10 systems reviewed and negative except as per HPI Physical Exam Updated Vital Signs BP 126/71   Pulse 63   Temp 97.6 F (36.4 C) (Oral)   Resp 17   Ht 5\' 8"  (1.727 m)   Wt 82.6 kg  SpO2 100%   BMI 27.67 kg/m   Physical Exam Constitutional:      Appearance: He is well-developed and well-nourished.     Comments: Alert and nontoxic.  Mildly deconditioned for age no respiratory distress  HENT:     Head: Normocephalic and atraumatic.  Eyes:     Extraocular Movements: Extraocular movements intact and EOM normal.     Pupils: Pupils are equal, round, and reactive to light.  Cardiovascular:     Rate and Rhythm: Normal rate and regular rhythm.     Pulses: Intact distal pulses.     Heart sounds: Normal heart sounds.  Pulmonary:     Effort: Pulmonary effort is normal.     Breath sounds: Normal breath sounds.  Abdominal:     General: Bowel sounds are normal. There is no distension.     Palpations: Abdomen is soft.     Tenderness: There is abdominal tenderness.     Comments: Mild left lower quadrant tenderness without guarding.  Musculoskeletal:        General: No edema. Normal range of motion.     Cervical back: Neck supple.  Skin:    General: Skin is warm, dry and intact.  Neurological:     Mental Status: He is alert and oriented to person, place, and time.      GCS: GCS eye subscore is 4. GCS verbal subscore is 5. GCS motor subscore is 6.     Coordination: Coordination normal.     Deep Tendon Reflexes: Strength normal.  Psychiatric:        Mood and Affect: Mood and affect and mood normal.     ED Results / Procedures / Treatments   Labs (all labs ordered are listed, but only abnormal results are displayed) Labs Reviewed  COMPREHENSIVE METABOLIC PANEL - Abnormal; Notable for the following components:      Result Value   Potassium 6.1 (*)    Chloride 113 (*)    CO2 9 (*)    Glucose, Bld 117 (*)    BUN 88 (*)    Creatinine, Ser 7.01 (*)    Albumin 3.4 (*)    GFR, Estimated 7 (*)    All other components within normal limits  CBC - Abnormal; Notable for the following components:   WBC 14.7 (*)    RBC 4.05 (*)    Hemoglobin 12.2 (*)    All other components within normal limits  URINALYSIS, ROUTINE W REFLEX MICROSCOPIC - Abnormal; Notable for the following components:   APPearance CLOUDY (*)    Hgb urine dipstick MODERATE (*)    Protein, ur 100 (*)    Leukocytes,Ua LARGE (*)    WBC, UA >50 (*)    Bacteria, UA RARE (*)    All other components within normal limits  PHOSPHORUS - Abnormal; Notable for the following components:   Phosphorus 7.0 (*)    All other components within normal limits  SARS CORONAVIRUS 2 (TAT 6-24 HRS)  URINE CULTURE  LACTIC ACID, PLASMA  MAGNESIUM  LACTIC ACID, PLASMA    EKG EKG Interpretation  Date/Time:  Wednesday October 09 2020 13:15:23 EST Ventricular Rate:  64 PR Interval:    QRS Duration: 98 QT Interval:  392 QTC Calculation: 405 R Axis:   46 Text Interpretation: Sinus or ectopic atrial rhythm Prolonged PR interval no sig change from previous Confirmed by Charlesetta Shanks [phone]) on 10/09/2020 2:46:48 PM   Radiology No results found.  Procedures Procedures (including critical care time) CRITICAL  CARE Performed by: Charlesetta Shanks   Total critical care time: 30 minutes  Critical care  time was exclusive of separately billable procedures and treating other patients.  Critical care was necessary to treat or prevent imminent or life-threatening deterioration.  Critical care was time spent personally by me on the following activities: development of treatment plan with patient and/or surrogate as well as nursing, discussions with consultants, evaluation of patient's response to treatment, examination of patient, obtaining history from patient or surrogate, ordering and performing treatments and interventions, ordering and review of laboratory studies, ordering and review of radiographic studies, pulse oximetry and re-evaluation of patient's condition. Medications Ordered in ED Medications - No data to display  ED Course  I have reviewed the triage vital signs and the nursing notes.  Pertinent labs & imaging results that were available during my care of the patient were reviewed by me and considered in my medical decision making (see chart for details).    MDM Rules/Calculators/A&P                          Consult: Dr. Marylyn Ishihara [facility] hospitalist for admission.  Patient is sent from Dr. Hazeline Junker office with acute on chronic renal failure.  Patient's potassium elevated at 6.1.  No acute EKG changes.  Patient has not had fevers or chills, no significant abdominal pain.  Urinalysis does show increased leukocytes.  Will obtain urine culture.  Patient is alert with normal vital signs.  No signs of sepsis at this time.  Will require admission for acute on chronic renal failure with hyperkalemia in the setting of metastatic bladder cancer and possible kidney obstruction. 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

## 2020-10-09 NOTE — ED Triage Notes (Signed)
Patient reports small amounts of urine, 'dribbling' and thinks he has a blockage according to his doctor. Had labs done this morning and creatinine was 6.6

## 2020-10-09 NOTE — ED Notes (Signed)
Pt stated that he had a port. Pt is waiting on room for them to get labs by the port.

## 2020-10-09 NOTE — ED Notes (Signed)
BLADDER SCAN PT 3X OML

## 2020-10-09 NOTE — H&P (Addendum)
History and Physical    Blake Peterson:[id number] DOB: 1940/12/05 DOA: 10/09/2020  PCP: Ria Bush, MD  Patient coming from: Home  Chief Complaint: Labwork abnormality.  HPI: Blake Peterson is a 80 y.o. male with medical history significant of HTN, HLD, bladder CA. Presenting with lab work abnormalities. He reports that he was at his [place] clinic appt today being evaluated for palliative chemo for his bladder CA. He was sent home; but when he got home, he received a call from the clinic recommending that he go to the ED for evaluation. Of note, he reports a change over the last 3 weeks in his UOP. He's had increased frequency w/ decreased volume over that time. He's also noticed increased "stinging" w/ urination over the last several days. He denies any other alleviating or aggravating factors.    ED Course: UA/UCx obtained. Given fluid bolus. EDP consulted urology, nephrology. [facility] was called for admission.   Review of Systems:  Denies CP, palpitations, dyspnea, syncopal episodes, N, V, D. Reports stinging w/ urination. Increased frequency of urination. Review of systems is otherwise negative for all not mentioned in HPI.   PMHx Past Medical History:  Diagnosis Date  . Basal cell carcinoma 12/2016   R anterior tibia  . Cataract   . Diverticulosis of colon   . History of colonic polyps   . HLD (hyperlipidemia)   . HTN (hypertension)   . Hypertensive retinopathy of both eyes, grade 1    [name]  . 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 Pre-diabetes   . Prediabetes   . 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 CATARACT EXTRACTION, BILATERAL  06/2017  . COLONOSCOPY  11/2012   mild-mod diverticulosis, int hem, rec rpt 5 yrs Fuller Plan)  . COLONOSCOPY  01/2018   2 TA, mod diverticulosis, rpt 5 yrs Fuller Plan)  . CYSTOSCOPY WITH INJECTION N/A 08/30/2020   Procedure: CYSTOSCOPY WITH INJECTION;  Surgeon: Alexis Frock, MD;   Location: [facility];  Service: Urology;  Laterality: N/A;  . IR IMAGING GUIDED PORT INSERTION  09/23/2020  . LAPAROSCOPY  08/30/2020   Procedure: LAPAROSCOPY WITH PELVIC LYMPH NODE DISSECTION, LEFT URETERAL LYSIS;  Surgeon: Alexis Frock, MD;  Location: [facility];  Service: Urology;;  . POLYPECTOMY    . TRANSURETHRAL RESECTION OF BLADDER TUMOR N/A 06/12/2019   Procedure: TRANSURETHRAL RESECTION OF BLADDER TUMOR (TURBT) WITH GEMCITABINE IN PACU;  Surgeon: Lucas Mallow, MD;  Location: [facility];  Service: Urology;  Laterality: N/A;  . TRANSURETHRAL RESECTION OF BLADDER TUMOR N/A 07/14/2019   Procedure: TRANSURETHRAL RESECTION OF BLADDER TUMOR (TURBT);  Surgeon: Lucas Mallow, MD;  Location: [facility];  Service: Urology;  Laterality: N/A;  . UMBILICAL HERNIA REPAIR  01/07/04    SocHx  reports that he quit smoking about 35 years ago. He has never used smokeless tobacco. He reports previous alcohol use. He reports that he does not use drugs.  Allergies  Allergen Reactions  . Naprosyn [Naproxen] Swelling    Fingers and ankles swelling    FamHx Family History  Problem Relation Age of Onset  . Hypertension Sister   . Colon cancer Sister 73  . Colon polyps Sister   . Heart attack Father 45  . Esophageal cancer Neg Hx   . Rectal cancer Neg Hx   . Stomach cancer Neg Hx     Prior to Admission medications   Medication Sig  Start Date End Date Taking? Authorizing Provider  acetaminophen (TYLENOL) 500 MG tablet Take 1 tablet (500 mg total) by mouth every 6 (six) hours as needed for moderate pain. Patient taking differently: Take 1,000 mg by mouth every 6 (six) hours as needed for moderate pain. 03/10/19  Yes Ria Bush, MD  atenolol (TENORMIN) 50 MG tablet TAKE 1 TABLET BY MOUTH EVERY DAY Patient taking differently: Take 50 mg by mouth daily. 10/08/20  Yes Ria Bush, MD  atorvastatin (LIPITOR) 80 MG tablet TAKE 1 TABLET BY MOUTH EVERYDAY AT BEDTIME Patient taking differently: Take  80 mg by mouth daily. 10/08/20  Yes Ria Bush, MD  chlorthalidone (HYGROTON) 25 MG tablet TAKE 1 TABLET BY MOUTH EVERY DAY Patient taking differently: Take by mouth daily. 11/20/19  Yes Ria Bush, MD  Cholecalciferol (VITAMIN D-3 PO) Take 50 mcg by mouth daily.    Yes [provider]  enalapril (VASOTEC) 10 MG tablet TAKE 1 TABLET BY MOUTH TWICE A DAY Patient taking differently: Take 10 mg by mouth 2 (two) times daily. 10/07/20  Yes Ria Bush, MD  folic acid (FOLVITE) 1 MG tablet Take 1 mg by mouth daily. 04/01/19  Yes [provider]  HYDROcodone-acetaminophen (NORCO) 5-325 MG tablet Take 1-2 tablets by mouth every 6 (six) hours as needed for moderate pain. 08/30/20  Yes Dancy, Estill Bamberg, PA-C  Omega-3 Fatty Acids (FISH OIL PO) Take 300 mg by mouth daily.    Yes [provider]  vitamin B-12 (CYANOCOBALAMIN) 1000 MCG tablet Take 1,000 mcg by mouth daily.   Yes [provider]  KLOR-CON M10 10 MEQ tablet TAKE 1 TABLET BY MOUTH EVERY DAY Patient not taking: No sig reported 05/21/20   Ria Bush, MD  lidocaine-prilocaine (EMLA) cream Apply 1 application topically as needed. Patient not taking: No sig reported 09/11/20   Wyatt Portela, MD  prochlorperazine (COMPAZINE) 10 MG tablet Take 1 tablet (10 mg total) by mouth every 6 (six) hours as needed for nausea or vomiting. Patient not taking: No sig reported 09/11/20   Wyatt Portela, MD    Physical Exam: Vitals:   10/09/20 1330 10/09/20 1400 10/09/20 1419 10/09/20 1430  BP: 130/66 (!) 124/92  126/71  Pulse: 62 86 62 63  Resp: 13 (!) 21 18 17   Temp:      TempSrc:      SpO2: 100% 91% 99% 100%  Weight:      Height:        General: 80 y.o. male resting in bed in NAD Eyes: PERRL, normal sclera ENMT: Nares patent w/o discharge, orophaynx clear, dentition normal, ears w/o discharge/lesions/ulcers Neck: Supple, trachea midline Cardiovascular: brady, regular, +S1, S2, no m/g/r, equal  pulses throughout, right chest port Respiratory: CTABL, no w/r/r, normal WOB GI: BS+, NDNT, no masses noted, no organomegaly noted MSK: No e/c/c Skin: No rashes, bruises, ulcerations noted Neuro: A&O x 3, no focal deficits Psyc: Appropriate interaction and affect, calm/cooperative  Labs on Admission: I have personally reviewed following labs and imaging studies  CBC: Recent Labs  Lab 10/09/20 0813 10/09/20 1310  WBC 12.9* 14.7*  NEUTROABS 9.8*  --   HGB 11.5* 12.2*  HCT 36.9* 39.5  MCV 96.3 97.5  PLT 268 527   Basic Metabolic Panel: Recent Labs  Lab 10/09/20 0813 10/09/20 1310  NA 137 136  K 5.8* 6.1*  CL 115* 113*  CO2 <10* 9*  GLUCOSE 138* 117*  BUN 79* 88*  CREATININE 6.66* 7.01*  CALCIUM 9.1 9.4  MG  --  1.8  PHOS  --  7.0*   GFR: Estimated Creatinine Clearance: 9.2 mL/min (A) (by C-G formula based on SCr of 7.01 mg/dL (H)). Liver Function Tests: Recent Labs  Lab 10/09/20 0813 10/09/20 1310  AST 21 22  ALT 22 24  ALKPHOS 98 104  BILITOT 0.2* 0.5  PROT 7.4 7.9  ALBUMIN 2.9* 3.4*   No results for input(s): LIPASE, AMYLASE in the last 168 hours. No results for input(s): AMMONIA in the last 168 hours. Coagulation Profile: No results for input(s): INR, PROTIME in the last 168 hours. Cardiac Enzymes: No results for input(s): CKTOTAL, CKMB, CKMBINDEX, TROPONINI in the last 168 hours. BNP (last 3 results) No results for input(s): PROBNP in the last 8760 hours. HbA1C: No results for input(s): HGBA1C in the last 72 hours. CBG: No results for input(s): GLUCAP in the last 168 hours. Lipid Profile: No results for input(s): CHOL, HDL, LDLCALC, TRIG, CHOLHDL, LDLDIRECT in the last 72 hours. Thyroid Function Tests: No results for input(s): TSH, T4TOTAL, FREET4, T3FREE, THYROIDAB in the last 72 hours. Anemia Panel: No results for input(s): VITAMINB12, FOLATE, FERRITIN, TIBC, IRON, RETICCTPCT in the last 72 hours. Urine analysis:    Component Value Date/Time    COLORURINE YELLOW 10/09/2020 1310   APPEARANCEUR CLOUDY (A) 10/09/2020 1310   LABSPEC 1.014 10/09/2020 1310   PHURINE 5.0 10/09/2020 1310   GLUCOSEU NEGATIVE 10/09/2020 1310   HGBUR MODERATE (A) 10/09/2020 1310   BILIRUBINUR NEGATIVE 10/09/2020 1310   BILIRUBINUR negative 04/28/2019 1202   KETONESUR NEGATIVE 10/09/2020 1310   PROTEINUR 100 (A) 10/09/2020 1310   UROBILINOGEN 1.0 04/28/2019 1202   NITRITE NEGATIVE 10/09/2020 1310   LEUKOCYTESUR LARGE (A) 10/09/2020 1310    Radiological Exams on Admission: No results found.  EKG: Independently reviewed. Sinus, no st elevations, no peaked t's  Assessment/Plan Acute renal failure Hyperkalemia Metabolic acidosis Obstructive uropathy w/ bilateral hydronephrosis     - admit to inpatient, progressive     - baseline Scr is 1.1 - 1.4 (Dec 2021); Scr up to 3.3 a week ago and 7.01 today.       - CT ab/pelvis 10/03/20: Slightly increased soft tissue thickening of the bladder wall with bilateral hydroureteronephrosis to the level of the UVJ which is slightly increased on the left but stable on the right. Findings concerning for disease progression.     - have consulted urology (Dr. Diona Fanti), he will see pt today, appreciate assistance     - place foley, give kayexelate 30g x 1, lokelma 10mg  TID, bicarb gtt in 1L [place] - watch fluid status, renal diet; case d/w nephrology, appreciate assistance  UTI?     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 Alen Blew; I have notified him that the patient will be admitted     - he was being evaluated for palliative chemo     - palliative care consult   HTN     - BP is acceptable right now, hold home meds d/t AKI  HLD     - continue statin  DVT prophylaxis: SCDs  Code Status: FULL  Family Communication: W/ wife at  bedside.  Consults called: Nephrology, urology.  Status is: Inpatient  Remains inpatient appropriate because:Inpatient level of care appropriate due  to severity of illness   Dispo: The patient is from: Home              Anticipated d/c is to: Home              Anticipated d/c date is: > 3 days              Patient currently is not medically stable to d/c.  Time spent coordinating admission: 70 minutes spent in the coordination of this admission.   Jonnie Finner DO [facility]  If 7PM-7AM, please contact night-coverage [email or web address]  10/09/2020, 3:01 PM

## 2020-10-09 NOTE — ED Notes (Signed)
Foley catheter attempted by Opal Sidles, NT and Hailey, NT, patient had pain and bleeding after attempting.  This nurse then attempted and was successful, patient does have some bleeding from previous attempt.

## 2020-10-09 NOTE — Consult Note (Signed)
Urology Consult  Consulting MD: Cherylann Ratel, MD  CC:  Renal failure, bilateral hydronephrosis  HPI: This is a  80 year old male  With metastatic, unresectable bladder cancer.  He had a TURBT in August of 2021.  At that time he had a bladder neck mass which obliterated his ureteral orifices and his trigone.  Cystectomy was attempted in December 2021 but there was significant extravesical disease as well as infiltration around his ureters and pelvic vessels.  It was recommended that he undergo palliative chemotherapy.  He was seen in the [facility] today, and his creatinine, normally in the 1.4-1.7 range was approximately 7.  He was sent to the emergency room for further management.    He denies recent abdominal or flank pain.  He denies any recent fever or chills.  At his last visit with Dr. Tresa Moore, it was suggested that if he needed management of his hydronephrosis, he would be best served by having nephrostomy tubes placed.  PMH: Past Medical History:  Diagnosis Date  . Basal cell carcinoma 12/2016   R anterior tibia  . Cataract   . Diverticulosis of colon   . History of colonic polyps   . HLD (hyperlipidemia)   . HTN (hypertension)   . Hypertensive retinopathy of both eyes, grade 1    [name]  . 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 Pre-diabetes   . Prediabetes   . 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 CATARACT EXTRACTION, BILATERAL  06/2017  . COLONOSCOPY  11/2012   mild-mod diverticulosis, int hem, rec rpt 5 yrs Fuller Plan)  . COLONOSCOPY  01/2018   2 TA, mod diverticulosis, rpt 5 yrs Fuller Plan)  . CYSTOSCOPY WITH INJECTION N/A 08/30/2020   Procedure: CYSTOSCOPY WITH INJECTION;  Surgeon: Alexis Frock, MD;  Location: [facility];  Service: Urology;  Laterality: N/A;  . IR IMAGING GUIDED PORT INSERTION  09/23/2020  . LAPAROSCOPY  08/30/2020   Procedure: LAPAROSCOPY WITH PELVIC LYMPH NODE DISSECTION, LEFT URETERAL LYSIS;   Surgeon: Alexis Frock, MD;  Location: [facility];  Service: Urology;;  . POLYPECTOMY    . TRANSURETHRAL RESECTION OF BLADDER TUMOR N/A 06/12/2019   Procedure: TRANSURETHRAL RESECTION OF BLADDER TUMOR (TURBT) WITH GEMCITABINE IN PACU;  Surgeon: Lucas Mallow, MD;  Location: [facility];  Service: Urology;  Laterality: N/A;  . TRANSURETHRAL RESECTION OF BLADDER TUMOR N/A 07/14/2019   Procedure: TRANSURETHRAL RESECTION OF BLADDER TUMOR (TURBT);  Surgeon: Lucas Mallow, MD;  Location: [facility];  Service: Urology;  Laterality: N/A;  . UMBILICAL HERNIA REPAIR  01/07/04    Allergies: Allergies  Allergen Reactions  . Naprosyn [Naproxen] Swelling    Fingers and ankles swelling    Medications: (Not in a hospital admission)    Social History: Social History   Socioeconomic History  . Marital status: Married    Spouse name: Not on file  . Number of children: 3  . Years of education: Not on file  . Highest education level: Not on file  Occupational History  . 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 Smoking status: Former Smoker    Quit date: 11/10/1984    Years since quitting: 35.9  . Smokeless tobacco: Never Used  Vaping Use  . Vaping Use: Never used  Substance and Sexual Activity  . Alcohol use: Not Currently    Alcohol/week: 0.0 standard drinks  . Drug use: No  .  Sexual activity: Never  Other Topics Concern  . Not on file  Social History Narrative   R handed   Lives with wife in single story home   Occupation: retired, was Clinical biochemist   Activity: enoys golfing, tries walking 3x/wk   Diet: good water, fruits/vegetables daily      Tested at risk of OSA during hospitalization (06/2019)   Social Determinants of Health   Financial Resource Strain: Not on file  Food Insecurity: Not on file  Transportation Needs: Not on file  Physical Activity: Not on file  Stress: Not on file  Social Connections: Not on file  Intimate Partner Violence: Not on  file    Family History: Family History  Problem Relation Age of Onset  . Hypertension Sister   . Colon cancer Sister 58  . Colon polyps Sister   . Heart attack Father 72  . Esophageal cancer Neg Hx   . Rectal cancer Neg Hx   . Stomach cancer Neg Hx     Review of Systems: Positive:   No gross hematuria, flank pain.  No lower abdominal pain.  Decreasing urinary output. Negative:   A further 10 point review of systems was negative except what is listed in the HPI.  Physical Exam: @VITALS2 @ General: No acute distress.  Awake. Head:  Normocephalic.  Atraumatic. ENT:  EOMI.  Mucous membranes moist Neck:  Supple.  No lymphadenopathy. CV:  Regular rate. Pulmonary: Equal effort bilaterally.   Skin:  Normal turgor.  No visible rash. Extremity: No gross deformity of extremities.  Neurologic: Alert. Appropriate mood.   Studies:  Recent Labs    10/09/20 0813 10/09/20 1310  HGB 11.5* 12.2*  WBC 12.9* 14.7*  PLT 268 298    Recent Labs    10/09/20 0813 10/09/20 1310  NA 137 136  K 5.8* 6.1*  CL 115* 113*  CO2 <10* 9*  BUN 79* 88*  CREATININE 6.66* 7.01*  CALCIUM 9.1 9.4  GFRNONAA 8* 7*      recent laboratory studies as well as CT images were reviewed   Assessment:   Metastatic, high rate bladder cancer with bladder infiltration, bilateral hydronephrosis and associated acute kidney injury  Plan:  1.  I discussed with the patient and his wife with the plan should be- interventional Radiology placement of percutaneous nephrostomy tubes.  I have briefly discussed the procedure with the patient and his wife.  2.  I will see about getting this done tomorrow.  Please make sure that he is not put on any anti-platelet therapy.  3.  I will notify Dr. Tresa Moore of the patient's admission    [phone]

## 2020-10-09 NOTE — Progress Notes (Signed)
Advised patient to not pull at tape holding incision closed. Let him know that can cause the incision to open back up and that it will fall off on its own. Pt verbalized understanding

## 2020-10-09 NOTE — Telephone Encounter (Signed)
Patient's wife called and stated patient is in the ED but has been told the wait is 19 hours. Patient's wife states "my husband cannot wait in the lobby that long and I don't know what to do other than leave". Informed patient's wife that as we discussed earlier, per Dr. Alen Blew, kidney function is a lot worse and the blockage (hydronephrosis) is a lot worse and cannot wait. Informed patient's wife that the ED will triage patients and see them accordingly. Also informed patient's wife that a ED triage note is visible in the chart where a ED nurse has assessed the patient. Dr. Alen Blew made aware and informed patient's wife that per Dr. Alen Blew, patient has severe electrolyte imbalance and it cannot be addressed as an outpatient. He will soon develop symptoms of weakness, shortness of breath among other things if  his blockage is not addressed. Patient's wife verbalized understanding and stated patient will wait.  1240- Spoke to charge nurse in the ED and was informed patient is now in room [place] [address]. Patient's wife made aware.

## 2020-10-09 NOTE — ED Notes (Signed)
Pt linens changed. Assisted to bedside commode. Fresh warm blankets provided. Lights turned down to promote a resting environment.

## 2020-10-10 ENCOUNTER — Encounter (HOSPITAL_COMMUNITY): Payer: Self-pay | Admitting: Internal Medicine

## 2020-10-10 ENCOUNTER — Inpatient Hospital Stay (HOSPITAL_COMMUNITY): Payer: PPO

## 2020-10-10 DIAGNOSIS — N139 Obstructive and reflux uropathy, unspecified: Secondary | ICD-10-CM

## 2020-10-10 DIAGNOSIS — Z515 Encounter for palliative care: Secondary | ICD-10-CM

## 2020-10-10 DIAGNOSIS — N179 Acute kidney failure, unspecified: Secondary | ICD-10-CM | POA: Diagnosis present

## 2020-10-10 DIAGNOSIS — Z7189 Other specified counseling: Secondary | ICD-10-CM

## 2020-10-10 DIAGNOSIS — N1339 Other hydronephrosis: Secondary | ICD-10-CM

## 2020-10-10 HISTORY — PX: IR NEPHROSTOMY PLACEMENT LEFT: IMG6063

## 2020-10-10 HISTORY — PX: IR NEPHROSTOMY PLACEMENT RIGHT: IMG6064

## 2020-10-10 LAB — COMPREHENSIVE METABOLIC PANEL
ALT: 21 U/L (ref 0–44)
AST: 20 U/L (ref 15–41)
Albumin: 3 g/dL — ABNORMAL LOW (ref 3.5–5.0)
Alkaline Phosphatase: 86 U/L (ref 38–126)
Anion gap: 15 (ref 5–15)
BUN: 92 mg/dL — ABNORMAL HIGH (ref 8–23)
CO2: 12 mmol/L — ABNORMAL LOW (ref 22–32)
Calcium: 8.6 mg/dL — ABNORMAL LOW (ref 8.9–10.3)
Chloride: 111 mmol/L (ref 98–111)
Creatinine, Ser: 7.48 mg/dL — ABNORMAL HIGH (ref 0.61–1.24)
GFR, Estimated: 7 mL/min — ABNORMAL LOW (ref >60)
Glucose, Bld: 129 mg/dL — ABNORMAL HIGH (ref 70–99)
Potassium: 4.5 mmol/L (ref 3.5–5.1)
Sodium: 138 mmol/L (ref 135–145)
Total Bilirubin: 0.5 mg/dL (ref 0.3–1.2)
Total Protein: 6.5 g/dL (ref 6.5–8.1)

## 2020-10-10 LAB — PROTIME-INR
INR: 1.3 — ABNORMAL HIGH (ref 0.8–1.2)
Prothrombin Time: 15.4 seconds — ABNORMAL HIGH (ref 11.4–15.2)

## 2020-10-10 LAB — PHOSPHORUS: Phosphorus: 7.7 mg/dL — ABNORMAL HIGH (ref 2.5–4.6)

## 2020-10-10 LAB — CBC
HCT: 33.4 % — ABNORMAL LOW (ref 39.0–52.0)
Hemoglobin: 10.7 g/dL — ABNORMAL LOW (ref 13.0–17.0)
MCH: 31 pg (ref 26.0–34.0)
MCHC: 32 g/dL (ref 30.0–36.0)
MCV: 96.8 fL (ref 80.0–100.0)
Platelets: 231 10*3/uL (ref 150–400)
RBC: 3.45 MIL/uL — ABNORMAL LOW (ref 4.22–5.81)
RDW: 14.5 % (ref 11.5–15.5)
WBC: 12.8 10*3/uL — ABNORMAL HIGH (ref 4.0–10.5)
nRBC: 0 % (ref 0.0–0.2)

## 2020-10-10 MED ORDER — FENTANYL CITRATE (PF) 100 MCG/2ML IJ SOLN
INTRAMUSCULAR | Status: AC
  Filled 2020-10-10: qty 2

## 2020-10-10 MED ORDER — MIDAZOLAM HCL 2 MG/2ML IJ SOLN
INTRAMUSCULAR | Status: AC
  Filled 2020-10-10: qty 4

## 2020-10-10 MED ORDER — CHLORHEXIDINE GLUCONATE CLOTH 2 % EX PADS
6.0000 | MEDICATED_PAD | Freq: Every day | CUTANEOUS | Status: DC
  Administered 2020-10-11 – 2020-10-12 (×2): 6 via TOPICAL

## 2020-10-10 MED ORDER — SODIUM CHLORIDE 0.9% FLUSH
5.0000 mL | Freq: Three times a day (TID) | INTRAVENOUS | Status: DC
  Administered 2020-10-10 – 2020-10-12 (×6): 5 mL

## 2020-10-10 MED ORDER — MIDAZOLAM HCL 2 MG/2ML IJ SOLN
INTRAMUSCULAR | Status: AC | PRN
  Administered 2020-10-10: 0.5 mg via INTRAVENOUS
  Administered 2020-10-10: 1 mg via INTRAVENOUS

## 2020-10-10 MED ORDER — LIDOCAINE HCL (PF) 1 % IJ SOLN
INTRAMUSCULAR | Status: AC | PRN
  Administered 2020-10-10 (×2): 10 mL via INTRADERMAL

## 2020-10-10 MED ORDER — IOHEXOL 300 MG/ML  SOLN
50.0000 mL | Freq: Once | INTRAMUSCULAR | Status: AC | PRN
  Administered 2020-10-10: 15 mL

## 2020-10-10 MED ORDER — LIDOCAINE HCL 1 % IJ SOLN
INTRAMUSCULAR | Status: AC
  Filled 2020-10-10: qty 20

## 2020-10-10 MED ORDER — NALOXONE HCL 0.4 MG/ML IJ SOLN
INTRAMUSCULAR | Status: AC
  Filled 2020-10-10: qty 1

## 2020-10-10 MED ORDER — FENTANYL CITRATE (PF) 100 MCG/2ML IJ SOLN
INTRAMUSCULAR | Status: AC | PRN
  Administered 2020-10-10 (×2): 50 ug via INTRAVENOUS

## 2020-10-10 MED ORDER — FLUMAZENIL 0.5 MG/5ML IV SOLN
INTRAVENOUS | Status: AC
  Filled 2020-10-10: qty 5

## 2020-10-10 NOTE — Progress Notes (Signed)
PROGRESS NOTE    Blake Peterson  IOE:[id number] DOB: Jun 09, 1941 DOA: 10/09/2020 PCP: Ria Bush, MD   Brief Narrative: Blake Peterson is a 80 y.o. male with a history of hypertension, hyperlipidemia, bladder cancer. Patient presented secondary to abnormal creatinine with evidence of obstructive uropathy causing AKI.   Assessment & Plan:   Principal Problem:   Acute renal failure (HCC) Active Problems:   HTN (hypertension)   Bladder cancer (HCC)   Acute bilateral obstructive uropathy   AKI Acute bilateral obstructive uropathy AKI complicated by enalapril, chlorthalidone use in setting of obstructive uropathy. Obstructive uropathy in setting of known urinary bladder cancer. Urology consulted on admission with recommendations for bilateral nephrostomy tubes which were placed on 1/20. Creatinine of 6.66 on admission and has peaked at 7.48. -Daily BMP -Urology recommendations: pending today  Gross hematuria Noted. Concern for possible UTI but also in setting of known metastatic urothelial carcinoma. Ceftriaxone started empirically. Hemoglobin slightly decreased but seems stable. -Daily CBC  UTI Symptoms of hesitancy and gross hematuria. Urine culture significant for 40k colonies of citrobacter koseri. Started on empiric Ceftriaxone on admission -Continue Ceftriaxone IV  Primary hypertension Patient is on atenolol 50 mg daily, enalapril 10 mg BID and chlorthalidone 25 mg daily. Antihypertensives held on admission -Resume home atenolol 50 mg daily -Continue to hold enalapril and chlorthalidone in setting of severe kidney injury  Hyperlipidemia Patient is on Lipitor and Omega 3 fatty acids as an outpatient -Continue Lipitor and Lovaza  Stage IV high grade urothelial carcinoma Patient follows with Dr. Alen Blew and Dr. Tresa Moore as an outpatient. Associated lymph node involvement. Recently started on palliative chemotherapy.   DVT prophylaxis: SCDs Code Status:   Code  Status: Full Code Family Communication: Wife at bedside Disposition Plan: Discharge likely in several days pending improvement of AKI in addition to urology recommendations   Consultants:   Urology  Interventional radiology  Procedures:   BILATERAL PERCUTANEOUS NEPHROSTOMY TUBE PLACEMENT (10/10/20)  Antimicrobials:  Ceftriaxone IV    Subjective: No significant issues, although he feels a little tired.  Objective: Vitals:   10/10/20 1130 10/10/20 1200 10/10/20 1330 10/10/20 1400  BP: 124/70 131/61 118/62 128/69  Pulse: 75 80 70 72  Resp:  18 18 18   Temp:      TempSrc:      SpO2: 100% 95% 100% 100%  Weight:      Height:        Intake/Output Summary (Last 24 hours) at 10/10/2020 1614 Last data filed at 10/10/2020 0956 Gross per 24 hour  Intake 1523.94 ml  Output -  Net 1523.94 ml   Filed Weights   10/09/20 1317  Weight: 82.6 kg    Examination:  General exam: Appears calm and comfortable Respiratory system: Clear to auscultation. Respiratory effort normal. Cardiovascular system: S1 & S2 heard, RRR. No murmurs, rubs, gallops or clicks. Gastrointestinal system: Abdomen is slightly distended, soft and nontender. No organomegaly or masses felt. Normal bowel sounds heard. Central nervous system: Alert and oriented. No focal neurological deficits. Musculoskeletal: No edema. No calf tenderness Skin: No cyanosis. No rashes Psychiatry: Judgement and insight appear normal. Mood & affect appropriate.     Data Reviewed: I have personally reviewed following labs and imaging studies  CBC Lab Results  Component Value Date   WBC 12.8 (H) 10/10/2020   RBC 3.45 (L) 10/10/2020   HGB 10.7 (L) 10/10/2020   HCT 33.4 (L) 10/10/2020   MCV 96.8 10/10/2020   MCH 31.0 10/10/2020   PLT  231 10/10/2020   MCHC 32.0 10/10/2020   RDW 14.5 10/10/2020   LYMPHSABS 1.4 10/09/2020   MONOABS 1.3 (H) 10/09/2020   EOSABS 0.2 10/09/2020   BASOSABS 0.1 86/76/1950     Last metabolic  panel Lab Results  Component Value Date   NA 138 10/10/2020   K 4.5 10/10/2020   CL 111 10/10/2020   CO2 12 (L) 10/10/2020   BUN 92 (H) 10/10/2020   CREATININE 7.48 (H) 10/10/2020   GLUCOSE 129 (H) 10/10/2020   GFRNONAA 7 (L) 10/10/2020   GFRAA 30 (L) 06/19/2020   CALCIUM 8.6 (L) 10/10/2020   PHOS 7.7 (H) 10/10/2020   PROT 6.5 10/10/2020   ALBUMIN 3.0 (L) 10/10/2020   BILITOT 0.5 10/10/2020   ALKPHOS 86 10/10/2020   AST 20 10/10/2020   ALT 21 10/10/2020   ANIONGAP 15 10/10/2020    CBG (last 3)  No results for input(s): GLUCAP in the last 72 hours.   GFR: Estimated Creatinine Clearance: 8.7 mL/min (A) (by C-G formula based on SCr of 7.48 mg/dL (H)).  Coagulation Profile: Recent Labs  Lab 10/10/20 0420  INR 1.3*    Recent Results (from the past 240 hour(s))  Urine Culture     Status: Abnormal   Collection Time: 10/01/20  3:38 PM   Specimen: Urine, Clean Catch  Result Value Ref Range Status   Specimen Description   Final    URINE, CLEAN CATCH Performed at [facility], [place] [address] [place], [place] [address]    Special Requests   Final    NONE Performed at [facility], [place] [address], [place], [place] [address]    Culture MULTIPLE SPECIES PRESENT, SUGGEST RECOLLECTION (A)  Final   Report Status 10/03/2020 FINAL  Final  SARS CORONAVIRUS 2 (TAT 6-24 HRS) Nasopharyngeal Nasopharyngeal Swab     Status: None   Collection Time: 10/09/20  1:10 PM   Specimen: Nasopharyngeal Swab  Result Value Ref Range Status   SARS Coronavirus 2 NEGATIVE NEGATIVE Final    Comment: (NOTE) SARS-CoV-2 target nucleic acids are NOT DETECTED.  The SARS-CoV-2 RNA is generally detectable in upper and lower respiratory specimens during the acute phase of infection. Negative results do not preclude SARS-CoV-2 infection, do not rule out co-infections with other pathogens, and should not be used as the sole basis for treatment or other  patient management decisions. Negative results must be combined with clinical observations, patient history, and epidemiological information. The expected result is Negative.  Fact Sheet for Patients: SugarRoll.be  Fact Sheet for Healthcare Providers: https://www.woods-mathews.com/  This test is not yet approved or cleared by the [place] FDA and  has been authorized for detection and/or diagnosis of SARS-CoV-2 by FDA under an Emergency Use Authorization (EUA). This EUA will remain  in effect (meaning this test can be used) for the duration of the COVID-19 declaration under Se ction 564(b)(1) of the Act, 21 U.S.C. section 360bbb-3(b)(1), unless the authorization is terminated or revoked sooner.  Performed at [facility], [place] [address] [place], [place] [address]   Urine culture     Status: Abnormal (Preliminary result)   Collection Time: 10/09/20  1:10 PM   Specimen: Urine, Random  Result Value Ref Range Status   Specimen Description   Final    URINE, RANDOM Performed at [facility], [place] [address], [place], [place] [address]    Special Requests   Final    NONE Performed at [facility]  [place] [address], [place], [place] [address]    Culture (A)  Final    40,000 COLONIES/mL CITROBACTER KOSERI SUSCEPTIBILITIES TO FOLLOW Performed at [facility], [place] [address] [place], [place] [address]    Report Status PENDING  Incomplete  Culture, blood (routine x 2)     Status: None (Preliminary result)   Collection Time: 10/09/20  5:35 PM   Specimen: BLOOD  Result Value Ref Range Status   Specimen Description   Final    BLOOD PORTA CATH Performed at [place] [address] [place], [place] [address]    Special Requests   Final    BOTTLES DRAWN AEROBIC AND ANAEROBIC Blood Culture adequate volume Performed at [place] [address], [place], [place] [address]    Culture   Final    NO GROWTH < 12 HOURS Performed at [place] [address] [place], [place] [address]    Report Status PENDING  Incomplete  Culture, blood (routine x 2)     Status: None (Preliminary result)   Collection Time: 10/09/20  6:40 PM   Specimen: BLOOD  Result Value Ref Range Status   Specimen Description   Final    BLOOD SITE NOT SPECIFIED Performed at [place] [address], [place], [place] [address]    Special Requests   Final    BOTTLES DRAWN AEROBIC AND ANAEROBIC Blood Culture adequate volume Performed at [place] [address], [place], [place] [address]    Culture   Final    NO GROWTH < 12 HOURS Performed at [place] [address] [place], [place] [address]    Report Status PENDING  Incomplete        Radiology Studies: IR NEPHROSTOMY PLACEMENT LEFT  Result Date: 10/10/2020 CLINICAL DATA:  Neoplasm of urinary bladder, worsening bilateral hydronephrosis EXAM: BILATERAL PERCUTANEOUS NEPHROSTOMY CATHETER PLACEMENT UNDER ULTRASOUND AND FLUOROSCOPIC GUIDANCE TECHNIQUE: The procedure, risks (including but not limited to bleeding, infection, organ damage ), benefits, and alternatives were explained to the patient. Questions regarding the procedure were encouraged and answered. The patient understands and consents to the procedure. Bilateralflank regions prepped with Betadine, draped in usual sterile fashion, infiltrated locally with 1% lidocaine.Patient was already receiving adequate prophylactic antibiotic coverage. Intravenous Fentanyl 138mcg and Versed 1.5mg  were administered as conscious sedation during continuous monitoring of the patient's level of consciousness and physiological / cardiorespiratory status by the radiology RN, with a total moderate sedation time of 19 minutes. Under real-time ultrasound guidance, a 21-gauge trocar needle was advanced into a posterior lower pole  calyx of the right renal collecting system. Ultrasound image documentation was saved. Urine spontaneously returned through the needle. Needle was exchanged over a guidewire for transitional dilator. Contrast injection confirmed appropriate positioning. Catheter was exchanged over a guidewire for a 10 French pigtail catheter, formed centrally within the right renal collecting system. Contrast injection confirms appropriate positioning and patency. In similar fashion, under real-time ultrasound guidance, a 21-gauge trocar needle was advanced into a posterior lower pole calyx of the left renal collecting system. Ultrasound image documentation was saved. Urine spontaneously returned through the needle. Needle was exchanged over a guidewire for transitional dilator. Contrast injection confirmed appropriate positioning. Catheter was exchanged over a guidewire for a 10 French pigtail catheter, formed centrally within the left renal collecting system. Contrast injection confirms appropriate positioning and Both catheters secured externally with 0 Prolene suture and  StatLock, and placed to external drain bags. The patient tolerated the procedure well. FLUOROSCOPY TIME:  2 minutes 36 second COMPLICATIONS: COMPLICATIONS none IMPRESSION: 1. Technically successful bilateral percutaneous nephrostomy catheter placement. Electronically Signed   By: Lucrezia Europe M.D.   On: 10/10/2020 16:09   IR NEPHROSTOMY PLACEMENT RIGHT  Result Date: 10/10/2020 CLINICAL DATA:  Neoplasm of urinary bladder, worsening bilateral hydronephrosis EXAM: BILATERAL PERCUTANEOUS NEPHROSTOMY CATHETER PLACEMENT UNDER ULTRASOUND AND FLUOROSCOPIC GUIDANCE TECHNIQUE: The procedure, risks (including but not limited to bleeding, infection, organ damage ), benefits, and alternatives were explained to the patient. Questions regarding the procedure were encouraged and answered. The patient understands and consents to the procedure. Bilateralflank regions prepped  with Betadine, draped in usual sterile fashion, infiltrated locally with 1% lidocaine.Patient was already receiving adequate prophylactic antibiotic coverage. Intravenous Fentanyl 177mcg and Versed 1.5mg  were administered as conscious sedation during continuous monitoring of the patient's level of consciousness and physiological / cardiorespiratory status by the radiology RN, with a total moderate sedation time of 19 minutes. Under real-time ultrasound guidance, a 21-gauge trocar needle was advanced into a posterior lower pole calyx of the right renal collecting system. Ultrasound image documentation was saved. Urine spontaneously returned through the needle. Needle was exchanged over a guidewire for transitional dilator. Contrast injection confirmed appropriate positioning. Catheter was exchanged over a guidewire for a 10 French pigtail catheter, formed centrally within the right renal collecting system. Contrast injection confirms appropriate positioning and patency. In similar fashion, under real-time ultrasound guidance, a 21-gauge trocar needle was advanced into a posterior lower pole calyx of the left renal collecting system. Ultrasound image documentation was saved. Urine spontaneously returned through the needle. Needle was exchanged over a guidewire for transitional dilator. Contrast injection confirmed appropriate positioning. Catheter was exchanged over a guidewire for a 10 French pigtail catheter, formed centrally within the left renal collecting system. Contrast injection confirms appropriate positioning and Both catheters secured externally with 0 Prolene suture and StatLock, and placed to external drain bags. The patient tolerated the procedure well. FLUOROSCOPY TIME:  2 minutes 36 second COMPLICATIONS: COMPLICATIONS none IMPRESSION: 1. Technically successful bilateral percutaneous nephrostomy catheter placement. Electronically Signed   By: Lucrezia Europe M.D.   On: 10/10/2020 16:09         Scheduled Meds: . atorvastatin  80 mg Oral Daily  . cholecalciferol  2,000 Units Oral Daily  . folic acid  1 mg Oral Daily  . omega-3 acid ethyl esters  1 g Oral Daily  . vitamin B-12  1,000 mcg Oral Daily   Continuous Infusions: . cefTRIAXone (ROCEPHIN)  IV Stopped (10/09/20 2139)     LOS: 1 day     Cordelia Poche, MD [facility] 10/10/2020, 4:14 PM  If 7PM-7AM, please contact night-coverage [email or web address]

## 2020-10-10 NOTE — Consult Note (Signed)
Chief Complaint: Patient was seen in consultation today for obstructive uropathy  Referring Physician(s): [place]  Supervising Physician: Arne Cleveland  Patient Status: [facility] - ED  History of Present Illness: Blake Peterson is a 80 y.o. male with past medical history of metastatic, unresectable bladder cancer.  Despite intervention in the OR x2 (TURBT 04/2020, attempted cystectomy 08/2020) he has progressive obstructive disease related to disease spread. He was referred to the ED yesterday by the Cancer center when routine labs showed an elevated creatinine of 7.48.   CT Abdomen Pelvis showed: 1. Slightly increased soft tissue thickening of the bladder wall with bilateral hydroureteronephrosis to the level of the UVJ which is slightly increased on the left but stable on the right. Findings concerning for disease progression. 2. Increased size and number of the index and non index retroperitoneal and pelvic lymph nodes, concerning for increased nodal metastatic disease.  IR consulted for bilateral percutanoues nephrostomy tube placements at the request of Dr. Diona Fanti.   Patient assessed his Radiology holding this AM.  He is alert and oriented.  States he was "feeling poorly" at home PTA.  Reports bloody urine at home which he thought was improving.  He does have a foley catheter in place currently with thick, bloody urine output.  He understands his current CT findings and is agreeable to bilateral PCN placement.  Wife updated by phone.   Past Medical History:  Diagnosis Date  . Basal cell carcinoma 12/2016   R anterior tibia  . Cataract   . Diverticulosis of colon   . History of colonic polyps   . HLD (hyperlipidemia)   . HTN (hypertension)   . Hypertensive retinopathy of both eyes, grade 1    [name]  . 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 Pre-diabetes   . Prediabetes   . Wears hearing aid in left ear     Past Surgical History:  Procedure Laterality  Date  . CATARACT EXTRACTION, BILATERAL  06/2017  . COLONOSCOPY  11/2012   mild-mod diverticulosis, int hem, rec rpt 5 yrs [facility] Plan)  . COLONOSCOPY  01/2018   2 TA, mod diverticulosis, rpt 5 yrs [facility] Plan)  . CYSTOSCOPY WITH INJECTION N/A 08/30/2020   Procedure: CYSTOSCOPY WITH INJECTION;  Surgeon: Alexis Frock, MD;  Location: [facility];  Service: Urology;  Laterality: N/A;  . IR IMAGING GUIDED PORT INSERTION  09/23/2020  . LAPAROSCOPY  08/30/2020   Procedure: LAPAROSCOPY WITH PELVIC LYMPH NODE DISSECTION, LEFT URETERAL LYSIS;  Surgeon: Alexis Frock, MD;  Location: [facility];  Service: Urology;;  . POLYPECTOMY    . TRANSURETHRAL RESECTION OF BLADDER TUMOR N/A 06/12/2019   Procedure: TRANSURETHRAL RESECTION OF BLADDER TUMOR (TURBT) WITH GEMCITABINE IN PACU;  Surgeon: Lucas Mallow, MD;  Location: [facility];  Service: Urology;  Laterality: N/A;  . TRANSURETHRAL RESECTION OF BLADDER TUMOR N/A 07/14/2019   Procedure: TRANSURETHRAL RESECTION OF BLADDER TUMOR (TURBT);  Surgeon: Lucas Mallow, MD;  Location: [facility];  Service: Urology;  Laterality: N/A;  . UMBILICAL HERNIA REPAIR  01/07/04    Allergies: Naprosyn [naproxen]  Medications: Prior to Admission medications   Medication Sig Start Date End Date Taking? Authorizing Provider  acetaminophen (TYLENOL) 500 MG tablet Take 1 tablet (500 mg total) by mouth every 6 (six) hours as needed for moderate pain. Patient taking differently: Take 1,000 mg by mouth every 6 (six) hours as needed for moderate pain. 03/10/19  Yes Ria Bush, MD  atenolol (TENORMIN) 50 MG tablet TAKE 1  TABLET BY MOUTH EVERY DAY Patient taking differently: Take 50 mg by mouth daily. 10/08/20  Yes Eustaquio Boyden, MD  atorvastatin (LIPITOR) 80 MG tablet TAKE 1 TABLET BY MOUTH EVERYDAY AT BEDTIME Patient taking differently: Take 80 mg by mouth daily. 10/08/20  Yes Eustaquio Boyden, MD  chlorthalidone (HYGROTON) 25 MG tablet TAKE 1 TABLET BY MOUTH EVERY DAY Patient taking  differently: Take by mouth daily. 11/20/19  Yes Eustaquio Boyden, MD  Cholecalciferol (VITAMIN D-3 PO) Take 50 mcg by mouth daily.    Yes [provider]  enalapril (VASOTEC) 10 MG tablet TAKE 1 TABLET BY MOUTH TWICE A DAY Patient taking differently: Take 10 mg by mouth 2 (two) times daily. 10/07/20  Yes Eustaquio Boyden, MD  folic acid (FOLVITE) 1 MG tablet Take 1 mg by mouth daily. 04/01/19  Yes [provider]  HYDROcodone-acetaminophen (NORCO) 5-325 MG tablet Take 1-2 tablets by mouth every 6 (six) hours as needed for moderate pain. 08/30/20  Yes Dancy, Marchelle Folks, PA-C  Omega-3 Fatty Acids (FISH OIL PO) Take 300 mg by mouth daily.    Yes [provider]  vitamin B-12 (CYANOCOBALAMIN) 1000 MCG tablet Take 1,000 mcg by mouth daily.   Yes [provider]  KLOR-CON M10 10 MEQ tablet TAKE 1 TABLET BY MOUTH EVERY DAY Patient not taking: No sig reported 05/21/20   Eustaquio Boyden, MD  lidocaine-prilocaine (EMLA) cream Apply 1 application topically as needed. Patient not taking: No sig reported 09/11/20   Benjiman Core, MD  prochlorperazine (COMPAZINE) 10 MG tablet Take 1 tablet (10 mg total) by mouth every 6 (six) hours as needed for nausea or vomiting. Patient not taking: No sig reported 09/11/20   Benjiman Core, MD     Family History  Problem Relation Age of Onset  . Hypertension Sister   . Colon cancer Sister 21  . Colon polyps Sister   . Heart attack Father 47  . Esophageal cancer Neg Hx   . Rectal cancer Neg Hx   . Stomach cancer Neg Hx     Social History   Socioeconomic History  . Marital status: Married    Spouse name: Not on file  . Number of children: 3  . Years of education: Not on file  . Highest education level: Not on file  Occupational History  . 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 Smoking status: Former Smoker    Quit date: 11/10/1984    Years since quitting: 35.9  . Smokeless tobacco: Never Used   Vaping Use  . Vaping Use: Never used  Substance and Sexual Activity  . Alcohol use: Not Currently    Alcohol/week: 0.0 standard drinks  . Drug use: No  . Sexual activity: Never  Other Topics Concern  . Not on file  Social History Narrative   R handed   Lives with wife in single story home   Occupation: retired, was Personnel officer   Activity: enoys golfing, tries walking 3x/wk   Diet: good water, fruits/vegetables daily      Tested at risk of OSA during hospitalization (06/2019)   Social Determinants of Health   Financial Resource Strain: Not on file  Food Insecurity: Not on file  Transportation Needs: Not on file  Physical Activity: Not on file  Stress: Not on file  Social Connections: Not on file     Review of Systems: A 12 point ROS discussed and pertinent positives are indicated in the HPI above.  All other systems are  negative.  Review of Systems  Constitutional: Negative for fatigue and fever.  Respiratory: Negative for cough and shortness of breath.   Cardiovascular: Negative for chest pain.  Gastrointestinal: Negative for abdominal pain, diarrhea, nausea and vomiting.  Genitourinary: Positive for difficulty urinating and hematuria.  Musculoskeletal: Negative for back pain.  Psychiatric/Behavioral: Negative for behavioral problems and confusion.    Vital Signs: BP 118/67   Pulse 70   Temp 98.4 F (36.9 C) (Oral)   Resp 18   Ht 5\' 8"  (1.727 m)   Wt 182 lb (82.6 kg)   SpO2 100%   BMI 27.67 kg/m   Physical Exam Vitals and nursing note reviewed.  Constitutional:      General: He is not in acute distress.    Appearance: Normal appearance. He is not ill-appearing.  HENT:     Mouth/Throat:     Mouth: Mucous membranes are moist.     Pharynx: Oropharynx is clear.  Neck:     Comments: Port in place, accessed. Cardiovascular:     Rate and Rhythm: Normal rate and regular rhythm.     Pulses: Normal pulses.  Pulmonary:     Effort: Pulmonary effort is normal.      Breath sounds: Normal breath sounds.  Abdominal:     General: Abdomen is flat.     Palpations: Abdomen is soft.  Genitourinary:    Comments: Foley catheter in place with thick, bloody urine Skin:    General: Skin is warm and dry.  Neurological:     General: No focal deficit present.     Mental Status: He is alert and oriented to person, place, and time. Mental status is at baseline.  Psychiatric:        Mood and Affect: Mood normal.        Behavior: Behavior normal.        Thought Content: Thought content normal.        Judgment: Judgment normal.      MD Evaluation Airway: WNL Heart: WNL Abdomen: WNL Chest/ Lungs: WNL ASA  Classification: 3 Mallampati/Airway Score: Two   Imaging: CT ABDOMEN PELVIS WO CONTRAST  Result Date: 10/03/2020 CLINICAL DATA:  History of urologic cancer assess treatment response. EXAM: CT ABDOMEN AND PELVIS WITHOUT CONTRAST TECHNIQUE: Multidetector CT imaging of the abdomen and pelvis was performed following the standard protocol without IV contrast. COMPARISON:  CT abdomen and pelvis June 12, 2020. FINDINGS: Lower chest: Coronary artery stents. No acute abnormality in the lung bases. Hepatobiliary: No suspicious hepatic lesions. Gallbladder is unremarkable. No biliary ductal dilatation. Pancreas: Similar fatty atrophy with calcifications in pancreatic head. No pancreatic ductal dilatation or surrounding inflammatory changes. Spleen: Normal in size without focal abnormality. Adrenals/Urinary Tract: The adrenal glands are unremarkable. There are multiple bilateral renal cysts. Bilateral hydroureteronephrosis to the level of the UVJ which is slightly increased on the left but stable on the right. Bladder is predominantly decompressed. However, there appears to be increased soft tissue thickening of the bladder wall along the bladder dome and posterior bladder (series 2, image [id number]). Stomach/Bowel: Radiopaque enteric contrast to the level of the rectum.  Stomach appears within normal limits. Appendix appears normal. No evidence of bowel wall thickening, distention or inflammatory change. Diverticulosis of the descending and sigmoid colon without findings of diverticulitis. Vascular/Lymphatic: Aortic atherosclerosis. Left-sided infrarenal IVC. Increased size and number of the index and non index retroperitoneal and pelvic lymph nodes. Index lymph nodes are as follows. Left periaortic lymph node measures 1.1 cm in shortest  axial dimension previously 0.8 cm (series 2, image 38). Left pelvic sidewall lymph node now measures 1.1 cm in shortest axial dimension previously 0.6 cm (series 2, image 64). Reproductive: Prostatic calcifications with mild prostatomegaly. Other: No ascites Musculoskeletal: Multilevel degenerative change of the spine. No suspicious lytic or blastic lesions of bone. IMPRESSION: 1. Slightly increased soft tissue thickening of the bladder wall with bilateral hydroureteronephrosis to the level of the UVJ which is slightly increased on the left but stable on the right. Findings concerning for disease progression. 2. Increased size and number of the index and non index retroperitoneal and pelvic lymph nodes, concerning for increased nodal metastatic disease. 3. Aortic atherosclerosis. 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 Electronically Signed   By: Dahlia Bailiff MD   On: 10/03/2020 16:51   IR IMAGING GUIDED PORT INSERTION  Result Date: 09/23/2020 INDICATION: History of metastatic bladder cancer. In need of durable intravenous access for chemotherapy administration. EXAM: IMPLANTED PORT A CATH PLACEMENT WITH ULTRASOUND AND FLUOROSCOPIC GUIDANCE COMPARISON:  CT of the chest, abdomen and pelvis-06/12/2020 MEDICATIONS: Ancef 2 gm IV; The antibiotic was administered within an appropriate time interval prior to skin puncture. ANESTHESIA/SEDATION: Moderate (conscious) sedation was employed during this procedure. A total of Versed 1.5 mg and Fentanyl 75 mcg  was administered intravenously. Moderate Sedation Time: 25 minutes. The patient's level of consciousness and vital signs were monitored continuously by radiology nursing throughout the procedure under my direct supervision. CONTRAST:  None FLUOROSCOPY TIME:  48 seconds (11 mGy) COMPLICATIONS: None immediate. PROCEDURE: The procedure, risks, benefits, and alternatives were explained to the patient. Questions regarding the procedure were encouraged and answered. The patient understands and consents to the procedure. The right neck and chest were prepped with chlorhexidine in a sterile fashion, and a sterile drape was applied covering the operative field. Maximum barrier sterile technique with sterile gowns and gloves were used for the procedure. A timeout was performed prior to the initiation of the procedure. Local anesthesia was provided with 1% lidocaine with epinephrine. After creating a small venotomy incision, a micropuncture kit was utilized to access the internal jugular vein. Real-time ultrasound guidance was utilized for vascular access including the acquisition of a permanent ultrasound image documenting patency of the accessed vessel. The microwire was utilized to measure appropriate catheter length. A subcutaneous port pocket was then created along the upper chest wall utilizing a combination of sharp and blunt dissection. The pocket was irrigated with sterile saline. A single lumen "Slim" sized power injectable port was chosen for placement. The 8 Fr catheter was tunneled from the port pocket site to the venotomy incision. The port was placed in the pocket. The external catheter was trimmed to appropriate length. At the venotomy, an 8 Fr peel-away sheath was placed over a guidewire under fluoroscopic guidance. The catheter was then placed through the sheath and the sheath was removed. Final catheter positioning was confirmed and documented with a fluoroscopic spot radiograph. The port was accessed with a  Huber needle, aspirated and flushed with heparinized saline. The venotomy site was closed with an interrupted 4-0 Vicryl suture. The port pocket incision was closed with interrupted 2-0 Vicryl suture. The skin was opposed with a running subcuticular 4-0 Vicryl suture. Dermabond and Steri-strips were applied to both incisions. Dressings were applied. The patient tolerated the procedure well without immediate post procedural complication. FINDINGS: After catheter placement, the tip lies within the superior cavoatrial junction. The catheter aspirates and flushes normally and is ready for immediate use. IMPRESSION: Successful placement of a  right internal jugular approach power injectable Port-A-Cath. The catheter is ready for immediate use. Electronically Signed   By: Sandi Mariscal M.D.   On: 09/23/2020 13:17    Labs:  CBC: Recent Labs    10/01/20 1148 10/09/20 0813 10/09/20 1310 10/10/20 0420  WBC 12.4* 12.9* 14.7* 12.8*  HGB 12.2* 11.5* 12.2* 10.7*  HCT 38.2* 36.9* 39.5 33.4*  PLT 265 268 298 231    COAGS: Recent Labs    10/10/20 0420  INR 1.3*    BMP: Recent Labs    06/19/20 1002 08/30/20 1034 10/01/20 1148 10/09/20 0813 10/09/20 1310 10/10/20 0420  NA 140   < > 134* 137 136 138  K 4.4   < > 5.9* 5.8* 6.1* 4.5  CL 105   < > 113* 115* 113* 111  CO2 26   < > 13* <10* 9* 12*  GLUCOSE 166*   < > 121* 138* 117* 129*  BUN 34*   < > 54* 79* 88* 92*  CALCIUM 9.7   < > 9.7 9.1 9.4 8.6*  CREATININE 2.34*   < > 3.30* 6.66* 7.01* 7.48*  GFRNONAA 26*   < > 19* 8* 7* 7*  GFRAA 30*  --   --   --   --   --    < > = values in this interval not displayed.    LIVER FUNCTION TESTS: Recent Labs    10/01/20 1148 10/09/20 0813 10/09/20 1310 10/10/20 0420  BILITOT 0.4 0.2* 0.5 0.5  AST _0 ALT _1 ALKPHOS 104 98 104 86  PROT 8.0 7.4 7.9 6.5  ALBUMIN 3.2* 2.9* 3.4* 3.0*    TUMOR MARKERS: No results for input(s): AFPTM, CEA, CA199, CHROMGRNA in the last 8760  hours.  Assessment and Plan: Bilateral hydroureteronephrosis in the setting of obstructive, unresectable, metastatic bladder cancer Patient to ED from Cancer center yesterday due to abnormal lab work.  His creatinine was elevated at 7.01 compared to his baseline of 1.4-1.7.  CT imaging in the ED shows bilateral hydroureteronephrosis.  Urology was consulted and recommended bilateral percutaneous nephrostomy tube placements.  Patient has been NPO today.  Not on blood thinners.  Case reviewed and approved by Dr. Vernard Gambles.  Plan to proceed with bilateral PCNs today in IR.  PA called patient's wife per his request and updated her on current plan and location.   Risks and benefits of bilateral PCN placement was discussed with the patient including, but not limited to, infection, bleeding, significant bleeding causing loss or decrease in renal function or damage to adjacent structures.   All of the patient's questions were answered, patient is agreeable to proceed.  Consent signed and in chart.  Thank you for this interesting consult.  I greatly enjoyed meeting Blake Peterson and look forward to participating in their care.  A copy of this report was sent to the requesting provider on this date.  Electronically Signed: Docia Barrier, PA 10/10/2020, 9:46 AM   I spent a total of 40 Minutes    in face to face in clinical consultation, greater than 50% of which was counseling/coordinating care for obstructive bladder cancer, bilateral hydroureteronephrosis.

## 2020-10-10 NOTE — ED Notes (Signed)
Report given to 3W.

## 2020-10-10 NOTE — Consult Note (Signed)
Consultation Note Date: 10/10/2020   Patient Name: Blake Peterson  DOB: 20-Dec-1940  MRN: [id number]  Age / Sex: 80 y.o., male  PCP: Blake Bush, MD Referring Physician: Mariel Aloe, MD  Reason for Consultation: Establishing goals of care  HPI/Patient Profile: 80 y.o. male  with past medical history of HTN, HLD, metastatic bladder cancer (s/p TURBT August 2021; radical cystectomy 08/30/20- mets to L obturator and iliac nodes-  Chemotherapy has been delayed due to worsening kidney function-recent CT showed increased size of lymph nodes and hydronephrosis) admitted on 10/09/2020 after visiting Dr. Alen Peterson and being found to have Cr 6.66. Has had urgent placement of nephrostomy tubes for obstructive uropathy and bilateral hydronephrosis. Palliative medicine consulted for goals of care.    Clinical Assessment and Goals of Care: Chart reviewed.  Patient seen in Emergency Department with his spouse at bedside. They note that he has recently returned from his procedure and is still a bit affected by the sedation medications.  I introduced Palliative Medicine- Palliative medicine is specialized medical care for people living with serious illness. It focuses on providing relief from the symptoms and stress of a serious illness. The goal is to improve quality of life for both the patient and the family. [name]'s wife tells me they are hoping that the nephrostomy tubes ill improve his renal function and allow for [name] to start chemotherapy. They are hopeful for him to discharge home tomorrow.  Due to [name]'s mental status not being quite clear- they requested to defer further goals of care discussion.   Primary Decision Maker PATIENT    SUMMARY OF RECOMMENDATIONS -Pt not able to participate in [name] discussion due to recent sedation meds from procedure -Patient is full code- but has a living will on chart  review -PMT provider will return next Monday and can facilitate [name] discussion then- if patient is discharged prior to that recommend outpatient Palliative consult  -If urgent GOC discussion is needed- (ie patient becomes unstable or appears to be progressing towards end of life)- please contact the on call Palliative provider over the weekend- recommend attending team discuss primary Palliative needs   Code Status/Advance Care Planning:  Full code   Prognosis:    Unable to determine  Discharge Planning: Home with Palliative Services  Primary Diagnoses: Present on Admission:  Acute renal failure (ARF) ([place])   I have reviewed the medical record, interviewed the patient and family, and examined the patient. The following aspects are pertinent.  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  Tobacco Use   Smoking status: Former Smoker    Quit date: 11/10/1984    Years since quitting: 35.9   Smokeless tobacco: Never Used  Vaping Use   Vaping Use: Never used  Substance and Sexual Activity   Alcohol use: Not Currently    Alcohol/week: 0.0 standard drinks   Drug use: No   Sexual activity: Never  Other Topics Concern   Not on file  Social History Narrative   R handed   Lives with wife in single story home   Occupation: retired, was Clinical biochemist   Activity: Nurse, adult, tries walking 3x/wk   Diet: good water, fruits/vegetables daily      Tested at risk of OSA during hospitalization (06/2019)   Social Determinants of Health   Financial Resource Strain: Not on file  Food Insecurity: Not on file  Transportation Needs: Not on file  Physical Activity: Not on file  Stress: Not on file  Social Connections: Not on file   Scheduled Meds:  atorvastatin  80 mg Oral Daily   cholecalciferol  2,000 Units Oral Daily   folic acid  1 mg Oral Daily   omega-3 acid ethyl esters  1 g Oral Daily   vitamin B-12  1,000 mcg Oral Daily   Continuous Infusions:  cefTRIAXone (ROCEPHIN)  IV Stopped (10/09/20 2139)   PRN Meds:.HYDROcodone-acetaminophen Medications Prior to Admission:  Prior to Admission medications   Medication Sig Start Date End Date Taking? Authorizing Provider  acetaminophen (TYLENOL) 500 MG tablet Take 1 tablet (500 mg total) by mouth every 6 (six) hours as needed for moderate pain. Patient taking differently: Take 1,000 mg by mouth every 6 (six) hours as needed for moderate pain. 03/10/19  Yes Blake Bush, MD  atenolol (TENORMIN) 50 MG tablet TAKE 1 TABLET BY MOUTH EVERY DAY Patient taking differently: Take 50 mg by mouth daily. 10/08/20  Yes Blake Bush, MD  atorvastatin (LIPITOR) 80 MG tablet TAKE 1 TABLET BY MOUTH EVERYDAY AT BEDTIME Patient taking differently: Take 80 mg by mouth daily. 10/08/20  Yes Blake Bush, MD  chlorthalidone (HYGROTON) 25 MG tablet TAKE 1 TABLET BY MOUTH EVERY DAY Patient taking differently: Take by mouth daily. 11/20/19  Yes Blake Bush, MD  Cholecalciferol (VITAMIN D-3 PO) Take 50 mcg by mouth daily.    Yes [provider]  enalapril (VASOTEC) 10 MG tablet TAKE 1 TABLET BY MOUTH TWICE A DAY Patient taking differently: Take 10 mg by mouth 2 (two) times daily. 10/07/20  Yes Blake Bush, MD  folic acid (FOLVITE) 1 MG tablet Take 1 mg by mouth daily. 04/01/19  Yes [provider]  HYDROcodone-acetaminophen (NORCO) 5-325 MG tablet Take 1-2 tablets by mouth every 6 (six) hours as needed for moderate pain. 08/30/20  Yes Dancy, Estill Bamberg, PA-C  Omega-3 Fatty Acids (FISH OIL PO) Take 300 mg by mouth daily.    Yes [provider]  vitamin B-12 (CYANOCOBALAMIN) 1000 MCG tablet Take 1,000 mcg by mouth daily.   Yes [provider]  KLOR-CON M10 10 MEQ tablet TAKE 1 TABLET BY MOUTH EVERY DAY Patient not taking: No sig reported 05/21/20   Blake Bush, MD  lidocaine-prilocaine (EMLA) cream Apply 1 application topically as needed. Patient not taking: No sig reported  09/11/20   Wyatt Portela, MD  prochlorperazine (COMPAZINE) 10 MG tablet Take 1 tablet (10 mg total) by mouth every 6 (six) hours as needed for nausea or vomiting. Patient not taking: No sig reported 09/11/20   Wyatt Portela, MD   Allergies  Allergen Reactions   Naprosyn [Naproxen] Swelling    Fingers and ankles swelling   Review of Systems  Unable to perform ROS: Mental status change    Physical Exam Vitals and nursing note reviewed.  Constitutional:      Comments: lethargic  Cardiovascular:     Rate and Rhythm: Normal rate.     Pulses: Normal pulses.  Neurological:     General: No focal deficit present.     Comments: Memory impaired due to recent sedation     Vital Signs: BP 128/69 (BP Location: Right Arm)    Pulse 72    Temp 98.4 F (36.9 C) (Oral)    Resp 18    Ht 5\' 8"  (1.727 m)    Wt 82.6 kg    SpO2 100%    BMI 27.67 kg/m  Pain Scale: Faces   Pain Score: 0-No pain   SpO2: SpO2: 100 % O2 Device:SpO2: 100 % O2 Flow Rate: .O2 Flow Rate (L/min): 2 L/min  IO: Intake/output summary:   Intake/Output Summary (Last 24 hours) at 10/10/2020 1519 Last data filed at 10/10/2020 0956 Gross per 24 hour  Intake 1523.94 ml  Output --  Net 1523.94 ml    LBM:   Baseline Weight: Weight: 82.6 kg Most recent weight: Weight: 82.6 kg     Palliative Assessment/Data:  PPS: 70%     Thank you for this consult. Palliative medicine will continue to follow and assist as needed.   Time In: 1431 Time Out: 1532 Time Total: 61 minutes Greater than 50%  of this time was spent counseling and coordinating care related to the above assessment and plan.  Signed by: Mariana Kaufman, AGNP-C Palliative Medicine    Please contact Palliative Medicine Team phone at [phone] for questions and concerns.  For individual provider: See Shea Evans

## 2020-10-10 NOTE — ED Notes (Signed)
Attempted to call report to floor 

## 2020-10-10 NOTE — ED Notes (Signed)
About 250cc of urine drained from R stent, pink-yellow in color. About 300cc of red urine drained from R stent.

## 2020-10-10 NOTE — ED Notes (Signed)
Pt linens replaced, warm blankets provided and repositioned in bed. Pt room cleaned up. Lights turned down to promote a resting environment.

## 2020-10-10 NOTE — Procedures (Signed)
  Procedure: Bilateral perc nephrostomy catheter placement   EBL:   minimal Complications:  none immediate  See full dictation in [facility].  Dillard Cannon MD Main # [phone] Pager  [phone] Mobile [phone]

## 2020-10-11 LAB — BASIC METABOLIC PANEL
Anion gap: 15 (ref 5–15)
BUN: 92 mg/dL — ABNORMAL HIGH (ref 8–23)
CO2: 15 mmol/L — ABNORMAL LOW (ref 22–32)
Calcium: 8.5 mg/dL — ABNORMAL LOW (ref 8.9–10.3)
Chloride: 111 mmol/L (ref 98–111)
Creatinine, Ser: 6.62 mg/dL — ABNORMAL HIGH (ref 0.61–1.24)
GFR, Estimated: 8 mL/min — ABNORMAL LOW (ref >60)
Glucose, Bld: 108 mg/dL — ABNORMAL HIGH (ref 70–99)
Potassium: 3.8 mmol/L (ref 3.5–5.1)
Sodium: 141 mmol/L (ref 135–145)

## 2020-10-11 LAB — CBC
HCT: 31.7 % — ABNORMAL LOW (ref 39.0–52.0)
Hemoglobin: 10.1 g/dL — ABNORMAL LOW (ref 13.0–17.0)
MCH: 30.5 pg (ref 26.0–34.0)
MCHC: 31.9 g/dL (ref 30.0–36.0)
MCV: 95.8 fL (ref 80.0–100.0)
Platelets: 222 10*3/uL (ref 150–400)
RBC: 3.31 MIL/uL — ABNORMAL LOW (ref 4.22–5.81)
RDW: 14.7 % (ref 11.5–15.5)
WBC: 11.5 10*3/uL — ABNORMAL HIGH (ref 4.0–10.5)
nRBC: 0 % (ref 0.0–0.2)

## 2020-10-11 LAB — URINE CULTURE: Culture: 40000 — AB

## 2020-10-11 MED ORDER — NEPRO/CARBSTEADY PO LIQD
237.0000 mL | ORAL | Status: DC
  Administered 2020-10-11 – 2020-10-12 (×2): 237 mL via ORAL
  Filled 2020-10-11 (×2): qty 237

## 2020-10-11 MED ORDER — ONDANSETRON HCL 4 MG/2ML IJ SOLN
4.0000 mg | Freq: Three times a day (TID) | INTRAMUSCULAR | Status: DC | PRN
  Administered 2020-10-11: 4 mg via INTRAVENOUS
  Filled 2020-10-11 (×2): qty 2

## 2020-10-11 MED ORDER — SODIUM CHLORIDE 0.9% FLUSH: 10.0000 mL | INTRAVENOUS | Status: DC | PRN

## 2020-10-11 NOTE — Progress Notes (Signed)
Referring Physician(s): Dahlstedt,S  Supervising Physician: Sandi Mariscal  Patient Status:  [name] - In-pt  Chief Complaint:  Bladder cancer, flank discomfort, bilateral hydronephrosis  Subjective: Pt doing ok today; has some expected mild soreness at PCN sites; denies N/V; asking about eating; foley with bloody urine, old blood at meatus   Allergies: Naprosyn [naproxen]  Medications: Prior to Admission medications   Medication Sig Start Date End Date Taking? Authorizing Provider  acetaminophen (TYLENOL) 500 MG tablet Take 1 tablet (500 mg total) by mouth every 6 (six) hours as needed for moderate pain. Patient taking differently: Take 1,000 mg by mouth every 6 (six) hours as needed for moderate pain. 03/10/19  Yes Ria Bush, MD  atenolol (TENORMIN) 50 MG tablet TAKE 1 TABLET BY MOUTH EVERY DAY Patient taking differently: Take 50 mg by mouth daily. 10/08/20  Yes Ria Bush, MD  atorvastatin (LIPITOR) 80 MG tablet TAKE 1 TABLET BY MOUTH EVERYDAY AT BEDTIME Patient taking differently: Take 80 mg by mouth daily. 10/08/20  Yes Ria Bush, MD  chlorthalidone (HYGROTON) 25 MG tablet TAKE 1 TABLET BY MOUTH EVERY DAY Patient taking differently: Take by mouth daily. 11/20/19  Yes Ria Bush, MD  Cholecalciferol (VITAMIN D-3 PO) Take 50 mcg by mouth daily.    Yes [provider]  enalapril (VASOTEC) 10 MG tablet TAKE 1 TABLET BY MOUTH TWICE A DAY Patient taking differently: Take 10 mg by mouth 2 (two) times daily. 10/07/20  Yes Ria Bush, MD  folic acid (FOLVITE) 1 MG tablet Take 1 mg by mouth daily. 04/01/19  Yes [provider]  HYDROcodone-acetaminophen (NORCO) 5-325 MG tablet Take 1-2 tablets by mouth every 6 (six) hours as needed for moderate pain. 08/30/20  Yes Dancy, Estill Bamberg, PA-C  Omega-3 Fatty Acids (FISH OIL PO) Take 300 mg by mouth daily.    Yes [provider]  vitamin B-12 (CYANOCOBALAMIN) 1000 MCG tablet Take 1,000 mcg by  mouth daily.   Yes [provider]  KLOR-CON M10 10 MEQ tablet TAKE 1 TABLET BY MOUTH EVERY DAY Patient not taking: No sig reported 05/21/20   Ria Bush, MD  lidocaine-prilocaine (EMLA) cream Apply 1 application topically as needed. Patient not taking: No sig reported 09/11/20   Wyatt Portela, MD  prochlorperazine (COMPAZINE) 10 MG tablet Take 1 tablet (10 mg total) by mouth every 6 (six) hours as needed for nausea or vomiting. Patient not taking: No sig reported 09/11/20   Wyatt Portela, MD     Vital Signs: BP 115/68 (BP Location: Right Arm)   Pulse 68   Temp 98 F (36.7 C) (Oral)   Resp 17   Ht 5\' 8"  (1.727 m)   Wt 182 lb (82.6 kg)   SpO2 100%   BMI 27.67 kg/m   Physical Exam awake/alert; bilateral PCN's intact, insertion sites okay, mildly tender to palpation, output from left 490 cc slightly blood-tinged urine, from right 565 cc yellow urine; Foley with bloody urine  Imaging: IR NEPHROSTOMY PLACEMENT LEFT  Result Date: 10/10/2020 CLINICAL DATA:  Neoplasm of urinary bladder, worsening bilateral hydronephrosis EXAM: BILATERAL PERCUTANEOUS NEPHROSTOMY CATHETER PLACEMENT UNDER ULTRASOUND AND FLUOROSCOPIC GUIDANCE TECHNIQUE: The procedure, risks (including but not limited to bleeding, infection, organ damage ), benefits, and alternatives were explained to the patient. Questions regarding the procedure were encouraged and answered. The patient understands and consents to the procedure. Bilateralflank regions prepped with Betadine, draped in usual sterile fashion, infiltrated locally with 1% lidocaine.Patient was already receiving adequate prophylactic antibiotic coverage.  Intravenous Fentanyl 110mcg and Versed 1.5mg  were administered as conscious sedation during continuous monitoring of the patient's level of consciousness and physiological / cardiorespiratory status by the radiology RN, with a total moderate sedation time of 19 minutes. Under real-time ultrasound  guidance, a 21-gauge trocar needle was advanced into a posterior lower pole calyx of the right renal collecting system. Ultrasound image documentation was saved. Urine spontaneously returned through the needle. Needle was exchanged over a guidewire for transitional dilator. Contrast injection confirmed appropriate positioning. Catheter was exchanged over a guidewire for a 10 French pigtail catheter, formed centrally within the right renal collecting system. Contrast injection confirms appropriate positioning and patency. In similar fashion, under real-time ultrasound guidance, a 21-gauge trocar needle was advanced into a posterior lower pole calyx of the left renal collecting system. Ultrasound image documentation was saved. Urine spontaneously returned through the needle. Needle was exchanged over a guidewire for transitional dilator. Contrast injection confirmed appropriate positioning. Catheter was exchanged over a guidewire for a 10 French pigtail catheter, formed centrally within the left renal collecting system. Contrast injection confirms appropriate positioning and Both catheters secured externally with 0 Prolene suture and StatLock, and placed to external drain bags. The patient tolerated the procedure well. FLUOROSCOPY TIME:  2 minutes 36 second COMPLICATIONS: COMPLICATIONS none IMPRESSION: 1. Technically successful bilateral percutaneous nephrostomy catheter placement. Electronically Signed   By: Lucrezia Europe M.D.   On: 10/10/2020 16:09   IR NEPHROSTOMY PLACEMENT RIGHT  Result Date: 10/10/2020 CLINICAL DATA:  Neoplasm of urinary bladder, worsening bilateral hydronephrosis EXAM: BILATERAL PERCUTANEOUS NEPHROSTOMY CATHETER PLACEMENT UNDER ULTRASOUND AND FLUOROSCOPIC GUIDANCE TECHNIQUE: The procedure, risks (including but not limited to bleeding, infection, organ damage ), benefits, and alternatives were explained to the patient. Questions regarding the procedure were encouraged and answered. The patient  understands and consents to the procedure. Bilateralflank regions prepped with Betadine, draped in usual sterile fashion, infiltrated locally with 1% lidocaine.Patient was already receiving adequate prophylactic antibiotic coverage. Intravenous Fentanyl 157mcg and Versed 1.5mg  were administered as conscious sedation during continuous monitoring of the patient's level of consciousness and physiological / cardiorespiratory status by the radiology RN, with a total moderate sedation time of 19 minutes. Under real-time ultrasound guidance, a 21-gauge trocar needle was advanced into a posterior lower pole calyx of the right renal collecting system. Ultrasound image documentation was saved. Urine spontaneously returned through the needle. Needle was exchanged over a guidewire for transitional dilator. Contrast injection confirmed appropriate positioning. Catheter was exchanged over a guidewire for a 10 French pigtail catheter, formed centrally within the right renal collecting system. Contrast injection confirms appropriate positioning and patency. In similar fashion, under real-time ultrasound guidance, a 21-gauge trocar needle was advanced into a posterior lower pole calyx of the left renal collecting system. Ultrasound image documentation was saved. Urine spontaneously returned through the needle. Needle was exchanged over a guidewire for transitional dilator. Contrast injection confirmed appropriate positioning. Catheter was exchanged over a guidewire for a 10 French pigtail catheter, formed centrally within the left renal collecting system. Contrast injection confirms appropriate positioning and Both catheters secured externally with 0 Prolene suture and StatLock, and placed to external drain bags. The patient tolerated the procedure well. FLUOROSCOPY TIME:  2 minutes 36 second COMPLICATIONS: COMPLICATIONS none IMPRESSION: 1. Technically successful bilateral percutaneous nephrostomy catheter placement. Electronically  Signed   By: Lucrezia Europe M.D.   On: 10/10/2020 16:09    Labs:  CBC: Recent Labs    10/09/20 0813 10/09/20 1310 10/10/20 0420 10/11/20 0341  WBC 12.9* 14.7* 12.8* 11.5*  HGB 11.5* 12.2* 10.7* 10.1*  HCT 36.9* 39.5 33.4* 31.7*  PLT 268 298 231 222    COAGS: Recent Labs    10/10/20 0420  INR 1.3*    BMP: Recent Labs    06/19/20 1002 08/30/20 1034 10/09/20 0813 10/09/20 1310 10/10/20 0420 10/11/20 0341  NA 140   < > 137 136 138 141  K 4.4   < > 5.8* 6.1* 4.5 3.8  CL 105   < > 115* 113* 111 111  CO2 26   < > <10* 9* 12* 15*  GLUCOSE 166*   < > 138* 117* 129* 108*  BUN 34*   < > 79* 88* 92* 92*  CALCIUM 9.7   < > 9.1 9.4 8.6* 8.5*  CREATININE 2.34*   < > 6.66* 7.01* 7.48* 6.62*  GFRNONAA 26*   < > 8* 7* 7* 8*  GFRAA 30*  --   --   --   --   --    < > = values in this interval not displayed.    LIVER FUNCTION TESTS: Recent Labs    10/01/20 1148 10/09/20 0813 10/09/20 1310 10/10/20 0420  BILITOT 0.4 0.2* 0.5 0.5  AST 21 21 22 20   ALT 20 22 24 21   ALKPHOS 104 98 104 86  PROT 8.0 7.4 7.9 6.5  ALBUMIN 3.2* 2.9* 3.4* 3.0*    Assessment and Plan: Patient with history of bladder cancer, acute kidney injury, bilateral obstructive hydronephrosis, Citrobacter UTI; status post bilateral percutaneous nephrostomies on 1/20; afebrile, WBC 11.5 down from 12.8, hemoglobin 10.1 down from 10.7, creatinine 6.62 down from 7.48, blood cultures negative to date; continue current treatment/catheter irrigations/lab monitoring; antbx per primary team; further plans as per urology/oncology/TRH   Electronically Signed: D. Rowe Robert, PA-C 10/11/2020, 12:17 PM   I spent a total of 20 minutes at the the patient's bedside AND on the patient's hospital floor or unit, greater than 50% of which was counseling/coordinating care for bilateral percutaneous nephrostomies    Patient ID: Blake Peterson, male   DOB: Jul 20, 1941, 80 y.o.   MRN: [id number]

## 2020-10-11 NOTE — Progress Notes (Signed)
Initial Nutrition Assessment  DOCUMENTATION CODES:   Not applicable  INTERVENTION:  Nepro Shake po daily, each supplement provides 425 kcal and 19 grams protein  Education provided  NUTRITION DIAGNOSIS:   Increased nutrient needs related to cancer and cancer related treatments as evidenced by estimated needs.    GOAL:   Patient will meet greater than or equal to 90% of their needs    MONITOR:   Labs,I & O's,Supplement acceptance,PO intake,Weight trends  REASON FOR ASSESSMENT:   Malnutrition Screening Tool    ASSESSMENT:  80 year old male admitted for acute renal failure presented with labwork abnormality from outpatient cancer clinic where he was being evaluated for palliative chemo for  bladder cancer. Patient reports 3 week history of increased urinary frequency with decreased volume as well as burning with urination over the last several days and found to have obstructive uropathy with bilateral hydronephrosis. 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  RD working remotely.  Pt is s/p bilateral nephrostomy tubes on 1/20.  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 He is followed by Dr. Alen Blew, plans to start palliative chemotherapy (tentatively 01/25) with carboplatin and gemcitabine once hydronephrosis issues addressed.   Spoke with pt via phone, reports feeling some better today and hopeful of going home later this evening or tomorrow. He ordered a fruit salad for lunch, reports appetite hasn't been that great recently, but is improving. He plans to order the pot roast and a baked potato for dinner. Recalls usual weight around 200 lbs a couple of years ago, states his weight increased to 230 lbs after he started taking prednisone for arthritis. Pt started exercising to lose weight,  reports weights have been stable 185-190 lb in the last 6 months. Currently pt weighs 82.6 kg (181.72 lbs) Per chart, weights trended down ~ 7 lbs (3.8%) in the last month which is insignificant insignificant for time frame, however concerning given advanced age and medical history significant of stage IV bladder cancer. RD educated on increased needs and the importance of protein when starting chemotherapy. Discussed good sources of lean proteins, encouraged small frequent meals and snacks as well as incorporating daily nutrition supplement to help him meet his needs. He is agreeable to trying Nepro supplement during admission.  I/Os: +728.9 ml since admit UOP: 1275 ml x 24 hrs Medications reviewed and include: Vitamin D, Folic acid, Lovaza, L89, Rocephin  Labs: BUN 92 (H), Cr 6.62 (H), K 3.8 (WNL), WBC 11.5 (H) Phosphorus 7.7 (H) on 1/20   NUTRITION - FOCUSED PHYSICAL EXAM:  Unable to complete at this time  Diet Order:   Diet Order            Diet Heart Room service appropriate? Yes; Fluid consistency: Thin  Diet effective now                 EDUCATION NEEDS:   Education needs have been addressed  Skin:  Skin Assessment: Reviewed RN Assessment  Last BM:  1/21 (small;red)  Height:   Ht Readings from Last 1 Encounters:  10/09/20 5\' 8"  (1.727 m)    Weight:   Wt Readings from Last 1 Encounters:  10/09/20 82.6 kg    BMI:  Body mass index is 27.67 kg/m.  Estimated Nutritional Needs:   Kcal:  2200-2450  Protein:  100-115  Fluid:  > 2L  Lajuan Lines, RD, LDN Clinical Nutrition After Hours/Weekend Pager # in [place]

## 2020-10-11 NOTE — Progress Notes (Signed)
PROGRESS NOTE    Blake Peterson  URK:[id number] DOB: Feb 19, 1941 DOA: 10/09/2020 PCP: Ria Bush, MD   Brief Narrative:  Blake Peterson is a 80 y.o. male with a history of hypertension, hyperlipidemia, bladder cancer. Patient presented secondary to abnormal creatinine with evidence of obstructive uropathy causing AKI.  AKI is slowly resolving and patient is nonoliguric.  He is still mildly acidotic, but this is improving as well.  Continues to remain on Rocephin for Citrobacter UTI.  May transition to oral antibiotics on discharge.  Assessment & Plan:   Principal Problem:   Acute renal failure (HCC) Active Problems:   HTN (hypertension)   Bladder cancer (HCC)   Acute bilateral obstructive uropathy   AKI-improving Acute bilateral obstructive uropathy AKI complicated by enalapril, chlorthalidone use in setting of obstructive uropathy. Obstructive uropathy in setting of known urinary bladder cancer. Urology consulted on admission with recommendations for bilateral nephrostomy tubes which were placed on 1/20. Creatinine of 6.66 on admission and has peaked at 7.48. -Daily BMP -Currently downtrending and at 6.62.  Baseline is near 1.  He still continues to have some acidosis as well which is also improving.  Likely can discharge in 1-2 days if creatinine further improved and urine output remains stable and acidosis is further improved without use of IVF.  Gross hematuria Noted. Concern for possible UTI but also in setting of known metastatic urothelial carcinoma. Ceftriaxone started empirically. Hemoglobin slightly decreased but seems stable. -Daily CBC  Citrobacter UTI Symptoms of hesitancy and gross hematuria. Urine culture significant for 40k colonies of citrobacter koseri. Started on empiric Ceftriaxone on admission -Continue Ceftriaxone IV during admission -Can convert to oral antibiotics on discharge  Primary hypertension Patient is on atenolol 50 mg daily, enalapril  10 mg BID and chlorthalidone 25 mg daily. Antihypertensives held on admission -Resume home atenolol 50 mg daily -Continue to hold enalapril and chlorthalidone in setting of severe kidney injury -Blood pressures are currently controlled  Hyperlipidemia Patient is on Lipitor and Omega 3 fatty acids as an outpatient -Continue Lipitor and Lovaza  Stage IV high grade urothelial carcinoma Patient follows with Dr. Alen Blew and Dr. Tresa Moore as an outpatient. Associated lymph node involvement. Recently started on palliative chemotherapy.   DVT prophylaxis: SCDs Code Status:   Full Code Family Communication:  Discussed with wife on phone Disposition Plan: Discharge likely in several days pending improvement of AKI in addition to urology recommendations   Consultants:   Urology  Interventional radiology  Procedures:   BILATERAL PERCUTANEOUS NEPHROSTOMY TUBE PLACEMENT (10/10/20)  Antimicrobials:  Ceftriaxone IV   Nutritional Assessment:  The patient's BMI is: Body mass index is 27.67 kg/m.Marland Kitchen   Subjective: Patient seen and evaluated today with no new acute complaints or concerns. No acute concerns or events noted overnight.  Objective: Vitals:   10/10/20 1800 10/10/20 2056 10/11/20 0209 10/11/20 0541  BP: 135/70 118/66 108/60 115/68  Pulse: 73 68 64 68  Resp: 16 16 16 17   Temp:  98.1 F (36.7 C) 98.7 F (37.1 C) 98 F (36.7 C)  TempSrc:  Oral Oral Oral  SpO2: 100% 100% 100% 100%  Weight:      Height:        Intake/Output Summary (Last 24 hours) at 10/11/2020 1008 Last data filed at 10/11/2020 0640 Gross per 24 hour  Intake 480 ml  Output 1275 ml  Net -795 ml   Filed Weights   10/09/20 1317  Weight: 82.6 kg    Examination:  General exam: Appears calm  and comfortable  Respiratory system: Clear to auscultation. Respiratory effort normal. Cardiovascular system: S1 & S2 heard, RRR.  Gastrointestinal system: Abdomen is soft Central nervous system: Alert and  awake Extremities: No edema Skin: No significant lesions noted Psychiatry: Flat affect. Bilateral nephrostomy bags with clear, yellow urine output noted.    Data Reviewed: I have personally reviewed following labs and imaging studies  CBC: Recent Labs  Lab 10/09/20 0813 10/09/20 1310 10/10/20 0420 10/11/20 0341  WBC 12.9* 14.7* 12.8* 11.5*  NEUTROABS 9.8*  --   --   --   HGB 11.5* 12.2* 10.7* 10.1*  HCT 36.9* 39.5 33.4* 31.7*  MCV 96.3 97.5 96.8 95.8  PLT 268 298 231 283   Basic Metabolic Panel: Recent Labs  Lab 10/09/20 0813 10/09/20 1310 10/10/20 0420 10/11/20 0341  NA 137 136 138 141  K 5.8* 6.1* 4.5 3.8  CL 115* 113* 111 111  CO2 <10* 9* 12* 15*  GLUCOSE 138* 117* 129* 108*  BUN 79* 88* 92* 92*  CREATININE 6.66* 7.01* 7.48* 6.62*  CALCIUM 9.1 9.4 8.6* 8.5*  MG  --  1.8  --   --   PHOS  --  7.0* 7.7*  --    GFR: Estimated Creatinine Clearance: 9.8 mL/min (A) (by C-G formula based on SCr of 6.62 mg/dL (H)). Liver Function Tests: Recent Labs  Lab 10/09/20 0813 10/09/20 1310 10/10/20 0420  AST 21 22 20   ALT 22 24 21   ALKPHOS 98 104 86  BILITOT 0.2* 0.5 0.5  PROT 7.4 7.9 6.5  ALBUMIN 2.9* 3.4* 3.0*   No results for input(s): LIPASE, AMYLASE in the last 168 hours. No results for input(s): AMMONIA in the last 168 hours. Coagulation Profile: Recent Labs  Lab 10/10/20 0420  INR 1.3*   Cardiac Enzymes: No results for input(s): CKTOTAL, CKMB, CKMBINDEX, TROPONINI in the last 168 hours. BNP (last 3 results) No results for input(s): PROBNP in the last 8760 hours. HbA1C: No results for input(s): HGBA1C in the last 72 hours. CBG: No results for input(s): GLUCAP in the last 168 hours. Lipid Profile: No results for input(s): CHOL, HDL, LDLCALC, TRIG, CHOLHDL, LDLDIRECT in the last 72 hours. Thyroid Function Tests: No results for input(s): TSH, T4TOTAL, FREET4, T3FREE, THYROIDAB in the last 72 hours. Anemia Panel: No results for input(s): VITAMINB12,  FOLATE, FERRITIN, TIBC, IRON, RETICCTPCT in the last 72 hours. Sepsis Labs: Recent Labs  Lab 10/09/20 1310 10/09/20 1734  LATICACIDVEN 0.7 0.7    Recent Results (from the past 240 hour(s))  Urine Culture     Status: Abnormal   Collection Time: 10/01/20  3:38 PM   Specimen: Urine, Clean Catch  Result Value Ref Range Status   Specimen Description   Final    URINE, CLEAN CATCH Performed at [facility], [place] [address] [place], [place] [address]    Special Requests   Final    NONE Performed at [facility], [place] [address] [place], [place] [address]    Culture MULTIPLE SPECIES PRESENT, SUGGEST RECOLLECTION (A)  Final   Report Status 10/03/2020 FINAL  Final  SARS CORONAVIRUS 2 (TAT 6-24 HRS) Nasopharyngeal Nasopharyngeal Swab     Status: None   Collection Time: 10/09/20  1:10 PM   Specimen: Nasopharyngeal Swab  Result Value Ref Range Status   SARS Coronavirus 2 NEGATIVE NEGATIVE Final    Comment: (NOTE) SARS-CoV-2 target nucleic acids are NOT DETECTED.  The SARS-CoV-2 RNA is generally detectable in upper and  lower respiratory specimens during the acute phase of infection. Negative results do not preclude SARS-CoV-2 infection, do not rule out co-infections with other pathogens, and should not be used as the sole basis for treatment or other patient management decisions. Negative results must be combined with clinical observations, patient history, and epidemiological information. The expected result is Negative.  Fact Sheet for Patients: SugarRoll.be  Fact Sheet for Healthcare Providers: https://www.woods-mathews.com/  This test is not yet approved or cleared by the [place] FDA and  has been authorized for detection and/or diagnosis of SARS-CoV-2 by FDA under an Emergency Use Authorization (EUA). This EUA will remain  in effect (meaning this test can be used) for the duration  of the COVID-19 declaration under Se ction 564(b)(1) of the Act, 21 U.S.C. section 360bbb-3(b)(1), unless the authorization is terminated or revoked sooner.  Performed at [facility], [place] [address] [place], [place] [address]   Urine culture     Status: Abnormal   Collection Time: 10/09/20  1:10 PM   Specimen: Urine, Random  Result Value Ref Range Status   Specimen Description   Final    URINE, RANDOM Performed at [place] [address], [place], [place] [address]    Special Requests   Final    NONE Performed at [facility], [place] [address] [place], [place] [address]    Culture 40,000 COLONIES/mL CITROBACTER KOSERI (A)  Final   Report Status 10/11/2020 FINAL  Final   Organism ID, Bacteria CITROBACTER KOSERI (A)  Final      Susceptibility   Citrobacter koseri - MIC*    CEFAZOLIN <=4 SENSITIVE Sensitive     CEFEPIME <=0.12 SENSITIVE Sensitive     CEFTRIAXONE <=0.25 SENSITIVE Sensitive     CIPROFLOXACIN <=0.25 SENSITIVE Sensitive     GENTAMICIN <=1 SENSITIVE Sensitive     IMIPENEM <=0.25 SENSITIVE Sensitive     NITROFURANTOIN <=16 SENSITIVE Sensitive     TRIMETH/SULFA <=20 SENSITIVE Sensitive     PIP/TAZO <=4 SENSITIVE Sensitive     * 40,000 COLONIES/mL CITROBACTER KOSERI  Culture, blood (routine x 2)     Status: None (Preliminary result)   Collection Time: 10/09/20  5:35 PM   Specimen: BLOOD  Result Value Ref Range Status   Specimen Description   Final    BLOOD PORTA CATH Performed at [place] [address] [place], [place] [address]    Special Requests   Final    BOTTLES DRAWN AEROBIC AND ANAEROBIC Blood Culture adequate volume Performed at [place] [address] [place], [place] [address]    Culture   Final    NO GROWTH < 12 HOURS Performed at [place] [address] [place], [place] [address]    Report Status PENDING  Incomplete  Culture, blood  (routine x 2)     Status: None (Preliminary result)   Collection Time: 10/09/20  6:40 PM   Specimen: BLOOD  Result Value Ref Range Status   Specimen Description   Final    BLOOD SITE NOT SPECIFIED Performed at [place] [address] [place], [place] [address]    Special Requests   Final    BOTTLES DRAWN AEROBIC AND ANAEROBIC Blood Culture adequate volume Performed at [place] [address] [place],  [address]    Culture   Final    NO GROWTH < 12 HOURS Performed at [facility], [address] [place], [place] [address]    Report Status PENDING  Incomplete         Radiology Studies: IR NEPHROSTOMY PLACEMENT LEFT  Result Date: 10/10/2020 CLINICAL DATA:  Neoplasm of urinary bladder, worsening bilateral hydronephrosis EXAM: BILATERAL PERCUTANEOUS NEPHROSTOMY CATHETER PLACEMENT UNDER ULTRASOUND AND FLUOROSCOPIC GUIDANCE TECHNIQUE: The procedure, risks (including but not limited to bleeding, infection, organ damage ), benefits, and alternatives were explained to the patient. Questions regarding the procedure were encouraged and answered. The patient understands and consents to the procedure. Bilateralflank regions prepped with Betadine, draped in usual sterile fashion, infiltrated locally with 1% lidocaine.Patient was already receiving adequate prophylactic antibiotic coverage. Intravenous Fentanyl 170mcg and Versed 1.5mg  were administered as conscious sedation during continuous monitoring of the patient's level of consciousness and physiological / cardiorespiratory status by the radiology RN, with a total moderate sedation time of 19 minutes. Under real-time ultrasound guidance, a 21-gauge trocar needle was advanced into a posterior lower pole calyx of the right renal collecting system. Ultrasound image documentation was saved. Urine spontaneously returned through the needle. Needle was exchanged over a guidewire for transitional dilator.  Contrast injection confirmed appropriate positioning. Catheter was exchanged over a guidewire for a 10 French pigtail catheter, formed centrally within the right renal collecting system. Contrast injection confirms appropriate positioning and patency. In similar fashion, under real-time ultrasound guidance, a 21-gauge trocar needle was advanced into a posterior lower pole calyx of the left renal collecting system. Ultrasound image documentation was saved. Urine spontaneously returned through the needle. Needle was exchanged over a guidewire for transitional dilator. Contrast injection confirmed appropriate positioning. Catheter was exchanged over a guidewire for a 10 French pigtail catheter, formed centrally within the left renal collecting system. Contrast injection confirms appropriate positioning and Both catheters secured externally with 0 Prolene suture and StatLock, and placed to external drain bags. The patient tolerated the procedure well. FLUOROSCOPY TIME:  2 minutes 36 second COMPLICATIONS: COMPLICATIONS none IMPRESSION: 1. Technically successful bilateral percutaneous nephrostomy catheter placement. Electronically Signed   By: Lucrezia Europe M.D.   On: 10/10/2020 16:09   IR NEPHROSTOMY PLACEMENT RIGHT  Result Date: 10/10/2020 CLINICAL DATA:  Neoplasm of urinary bladder, worsening bilateral hydronephrosis EXAM: BILATERAL PERCUTANEOUS NEPHROSTOMY CATHETER PLACEMENT UNDER ULTRASOUND AND FLUOROSCOPIC GUIDANCE TECHNIQUE: The procedure, risks (including but not limited to bleeding, infection, organ damage ), benefits, and alternatives were explained to the patient. Questions regarding the procedure were encouraged and answered. The patient understands and consents to the procedure. Bilateralflank regions prepped with Betadine, draped in usual sterile fashion, infiltrated locally with 1% lidocaine.Patient was already receiving adequate prophylactic antibiotic coverage. Intravenous Fentanyl 121mcg and Versed  1.5mg  were administered as conscious sedation during continuous monitoring of the patient's level of consciousness and physiological / cardiorespiratory status by the radiology RN, with a total moderate sedation time of 19 minutes. Under real-time ultrasound guidance, a 21-gauge trocar needle was advanced into a posterior lower pole calyx of the right renal collecting system. Ultrasound image documentation was saved. Urine spontaneously returned through the needle. Needle was exchanged over a guidewire for transitional dilator. Contrast injection confirmed appropriate positioning. Catheter was exchanged over a guidewire for a 10 French pigtail catheter, formed centrally within the right renal collecting system. Contrast injection confirms appropriate positioning and patency. In similar fashion, under real-time ultrasound guidance, a 21-gauge trocar needle was advanced into a posterior lower pole calyx of the left renal collecting system. Ultrasound image documentation was saved. Urine spontaneously returned through the needle. Needle was exchanged  over a guidewire for transitional dilator. Contrast injection confirmed appropriate positioning. Catheter was exchanged over a guidewire for a 10 French pigtail catheter, formed centrally within the left renal collecting system. Contrast injection confirms appropriate positioning and Both catheters secured externally with 0 Prolene suture and StatLock, and placed to external drain bags. The patient tolerated the procedure well. FLUOROSCOPY TIME:  2 minutes 36 second COMPLICATIONS: COMPLICATIONS none IMPRESSION: 1. Technically successful bilateral percutaneous nephrostomy catheter placement. Electronically Signed   By: Lucrezia Europe M.D.   On: 10/10/2020 16:09        Scheduled Meds: . atorvastatin  80 mg Oral Daily  . Chlorhexidine Gluconate Cloth  6 each Topical Daily  . cholecalciferol  2,000 Units Oral Daily  . folic acid  1 mg Oral Daily  . omega-3 acid ethyl  esters  1 g Oral Daily  . sodium chloride flush  5 mL Intracatheter Q8H  . vitamin B-12  1,000 mcg Oral Daily   Continuous Infusions: . cefTRIAXone (ROCEPHIN)  IV 2 g (10/10/20 2110)     LOS: 2 days    Time spent: 35 minutes    Terris Germano Darleen Crocker, DO [facility]  If 7PM-7AM, please contact night-coverage [email or web address] 10/11/2020, 10:08 AM

## 2020-10-12 LAB — BASIC METABOLIC PANEL
Anion gap: 10 (ref 5–15)
BUN: 75 mg/dL — ABNORMAL HIGH (ref 8–23)
CO2: 18 mmol/L — ABNORMAL LOW (ref 22–32)
Calcium: 8.6 mg/dL — ABNORMAL LOW (ref 8.9–10.3)
Chloride: 113 mmol/L — ABNORMAL HIGH (ref 98–111)
Creatinine, Ser: 4.28 mg/dL — ABNORMAL HIGH (ref 0.61–1.24)
GFR, Estimated: 14 mL/min — ABNORMAL LOW (ref >60)
Glucose, Bld: 151 mg/dL — ABNORMAL HIGH (ref 70–99)
Potassium: 3.4 mmol/L — ABNORMAL LOW (ref 3.5–5.1)
Sodium: 141 mmol/L (ref 135–145)

## 2020-10-12 LAB — CBC
HCT: 30.9 % — ABNORMAL LOW (ref 39.0–52.0)
Hemoglobin: 9.9 g/dL — ABNORMAL LOW (ref 13.0–17.0)
MCH: 30.6 pg (ref 26.0–34.0)
MCHC: 32 g/dL (ref 30.0–36.0)
MCV: 95.4 fL (ref 80.0–100.0)
Platelets: 239 10*3/uL (ref 150–400)
RBC: 3.24 MIL/uL — ABNORMAL LOW (ref 4.22–5.81)
RDW: 14.6 % (ref 11.5–15.5)
WBC: 11 10*3/uL — ABNORMAL HIGH (ref 4.0–10.5)
nRBC: 0 % (ref 0.0–0.2)

## 2020-10-12 LAB — CREATININE, SERUM
Creatinine, Ser: 4.15 mg/dL — ABNORMAL HIGH (ref 0.61–1.24)
GFR, Estimated: 14 mL/min — ABNORMAL LOW (ref >60)

## 2020-10-12 MED ORDER — HEPARIN SOD (PORK) LOCK FLUSH 100 UNIT/ML IV SOLN
500.0000 [IU] | INTRAVENOUS | Status: DC | PRN
  Filled 2020-10-12: qty 5

## 2020-10-12 MED ORDER — POTASSIUM CHLORIDE CRYS ER 20 MEQ PO TBCR
40.0000 meq | EXTENDED_RELEASE_TABLET | Freq: Once | ORAL | Status: AC
  Administered 2020-10-12: 40 meq via ORAL
  Filled 2020-10-12: qty 2

## 2020-10-12 MED ORDER — CEPHALEXIN 500 MG PO CAPS: 500.0000 mg | ORAL_CAPSULE | Freq: Two times a day (BID) | ORAL | 0 refills | Status: AC

## 2020-10-12 NOTE — Progress Notes (Addendum)
Urology Consult follow up notes  Consulting MD: Cherylann Ratel, MD  CC:  Renal failure, bilateral hydronephrosis  SUBJECTIVE: Since last seen, patient underwent bilateral nephrostomy tube placement with IR. Hb has been stable. Creatinine steadily improving. Patient has had very minimal, bloody urine output from foley catheter. Patient reports he is feeling well.  Physical Exam: Vitals:   10/11/20 2128 10/12/20 0437  BP: 123/73 113/70  Pulse: 72 79  Resp: 18 16  Temp: 97.9 F (36.6 C) 98.1 F (36.7 C)  SpO2: 96% 99%    General: No acute distress.  Awake. Head:  Normocephalic.  Atraumatic. ENT:  EOMI.  Mucous membranes moist Neck:  Supple.  No lymphadenopathy. CV:  Regular rate. GU:  Bilateral nephrostomy tubes in place, draining clear yellow urine, left side slightly pink tinged. Foley in place with scant dark bloody output.  Neurologic: Alert. Appropriate mood.   Studies:  Recent Labs    10/11/20 0341 10/12/20 0300  HGB 10.1* 9.9*  WBC 11.5* 11.0*  PLT 222 239    Recent Labs    10/11/20 0341 10/12/20 0300  NA 141 141  K 3.8 3.4*  CL 111 113*  CO2 15* 18*  BUN 92* 75*  CREATININE 6.62* 4.28*  CALCIUM 8.5* 8.6*  GFRNONAA 8* 14*     Recent laboratory studies as well as CT images were reviewed   Assessment:   Metastatic, high rate bladder cancer with bladder infiltration, bilateral hydronephrosis and associated acute kidney injury, now s/p bilateral nephrostomy tube placement  Plan:  1.  Ok to remove foley catheter, given scant output and patient now fully diverted with bilateral PCNs. Continue nephrostomy tubes to drainage on discharge.   2.  Patient has follow up scheduled with oncology this week, will request follow up for patient with Urology  I have seen and examined the patient and agree with the above assessment and plan.  -Foley removed -B/l PCN to gravity, clear -F/u outpatient with [facility].  Matt R. [facility]  Pager:  [phone]

## 2020-10-12 NOTE — Discharge Summary (Signed)
Physician Discharge Summary  CANDLER GINSBERG SAY:[id number] DOB: [birth year] DOA: 10/09/2020  PCP: Ria Bush, MD  Admit date: 10/09/2020 Discharge date: 10/12/2020  Admitted From: Home Disposition: Home  Recommendations for Outpatient Follow-up:  1. Follow up with PCP in 1 week 2. Follow up with urology and medical oncology 3. Please obtain BMP/CBC in 2 days 4. Please follow up on the following pending results: None  Home Health: TOC Equipment/Devices: None  Discharge Condition: Stable CODE STATUS: Full code Diet recommendation: Heart healthy   Brief/Interim Summary:  Admission HPI written by Cherylann Ratel, DO   Chief Complaint: Labwork abnormality.  HPI: Blake Peterson is a 80 y.o. male with medical history significant of HTN, HLD, bladder CA. Presenting with lab work abnormalities. He reports that he was at his [place] clinic appt today being evaluated for palliative chemo for his bladder CA. He was sent home; but when he got home, he received a call from the clinic recommending that he go to the ED for evaluation. Of note, he reports a change over the last 3 weeks in his UOP. He's had increased frequency w/ decreased volume over that time. He's also noticed increased "stinging" w/ urination over the last several days. He denies any other alleviating or aggravating factors.    Hospital course:  AKI Acute bilateral obstructive uropathy AKI complicated by enalapril, chlorthalidone use in setting of obstructive uropathy. Obstructive uropathy in setting of known urinary bladder cancer. Urology consulted on admission with recommendations for bilateral nephrostomy tubes which were placed on 1/20. Creatinine of 6.66 on admission and has peaked at 7.48. Trended down to 4.15 prior to discharge. Patient to follow-up with urology. Will need repeat BMP in 2 days.  Gross hematuria Noted. Concern for possible UTI but also in setting of known metastatic urothelial carcinoma.  Ceftriaxone started empirically. Hemoglobin slightly decreased but seems stable. Foley catheter discontinued prior to discharge.  UTI Symptoms of hesitancy and gross hematuria. Urine culture significant for 40k colonies of citrobacter koseri. Started on empiric Ceftriaxone on admission. Transition to Keflex to complete 7 day course.  Primary hypertension Patient is on atenolol 50 mg daily, enalapril 10 mg BID and chlorthalidone 25 mg daily. Antihypertensives held on admission. Continue atenolol. Discontinue enalapril and chlorthalidone in setting of severe kidney injury. Reevalute as an outpatient.  Hyperlipidemia Patient is on Lipitor and Omega 3 fatty acids as an outpatient. Continue Lipitor and Lovaza  Stage IV high grade urothelial carcinoma Patient follows with Dr. Alen Blew and Dr. Tresa Moore as an outpatient. Associated lymph node involvement. Recently started on palliative chemotherapy.  Discharge Diagnoses:  Principal Problem:   Acute renal failure ([facility]) Active Problems:   HTN (hypertension)   Bladder cancer ([place])   Acute bilateral obstructive uropathy    Discharge Instructions  Discharge Instructions    Increase activity slowly   Complete by: As directed    No wound care   Complete by: As directed      Allergies as of 10/12/2020      Reactions   Naprosyn [naproxen] Swelling   Fingers and ankles swelling      Medication List    STOP taking these medications   acetaminophen 500 MG tablet Commonly known as: TYLENOL   chlorthalidone 25 MG tablet Commonly known as: HYGROTON   enalapril 10 MG tablet Commonly known as: VASOTEC   Klor-Con M10 10 MEQ tablet Generic drug: potassium chloride   lidocaine-prilocaine cream Commonly known as: EMLA   prochlorperazine 10 MG tablet Commonly  known as: COMPAZINE     TAKE these medications   atenolol 50 MG tablet Commonly known as: TENORMIN TAKE 1 TABLET BY MOUTH EVERY DAY   atorvastatin 80 MG tablet Commonly known  as: LIPITOR TAKE 1 TABLET BY MOUTH EVERYDAY AT BEDTIME What changed: See the new instructions.   cephALEXin 500 MG capsule Commonly known as: KEFLEX Take 1 capsule (500 mg total) by mouth 2 (two) times daily for 4 days.   FISH OIL PO Take 300 mg by mouth daily.   folic acid 1 MG tablet Commonly known as: FOLVITE Take 1 mg by mouth daily.   HYDROcodone-acetaminophen 5-325 MG tablet Commonly known as: Norco Take 1-2 tablets by mouth every 6 (six) hours as needed for moderate pain.   vitamin B-12 1000 MCG tablet Commonly known as: CYANOCOBALAMIN Take 1,000 mcg by mouth daily.   VITAMIN D-3 PO Take 50 mcg by mouth daily.       Follow-up Information    Ria Bush, MD. Schedule an appointment as soon as possible for a visit in 1 week(s).   Specialty: Family Medicine Why: Hospital follow-up Contact information: [place] [address] [phone]              Allergies  Allergen Reactions  . Naprosyn [Naproxen] Swelling    Fingers and ankles swelling    Consultations:  Urology  Interventional radiology   Procedures/Studies: CT ABDOMEN PELVIS WO CONTRAST  Result Date: 10/03/2020 CLINICAL DATA:  History of urologic cancer assess treatment response. EXAM: CT ABDOMEN AND PELVIS WITHOUT CONTRAST TECHNIQUE: Multidetector CT imaging of the abdomen and pelvis was performed following the standard protocol without IV contrast. COMPARISON:  CT abdomen and pelvis June 12, 2020. FINDINGS: Lower chest: Coronary artery stents. No acute abnormality in the lung bases. Hepatobiliary: No suspicious hepatic lesions. Gallbladder is unremarkable. No biliary ductal dilatation. Pancreas: Similar fatty atrophy with calcifications in pancreatic head. No pancreatic ductal dilatation or surrounding inflammatory changes. Spleen: Normal in size without focal abnormality. Adrenals/Urinary Tract: The adrenal glands are unremarkable. There are multiple bilateral  renal cysts. Bilateral hydroureteronephrosis to the level of the UVJ which is slightly increased on the left but stable on the right. Bladder is predominantly decompressed. However, there appears to be increased soft tissue thickening of the bladder wall along the bladder dome and posterior bladder (series 2, image [id number]). Stomach/Bowel: Radiopaque enteric contrast to the level of the rectum. Stomach appears within normal limits. Appendix appears normal. No evidence of bowel wall thickening, distention or inflammatory change. Diverticulosis of the descending and sigmoid colon without findings of diverticulitis. Vascular/Lymphatic: Aortic atherosclerosis. Left-sided infrarenal IVC. Increased size and number of the index and non index retroperitoneal and pelvic lymph nodes. Index lymph nodes are as follows. Left periaortic lymph node measures 1.1 cm in shortest axial dimension previously 0.8 cm (series 2, image 38). Left pelvic sidewall lymph node now measures 1.1 cm in shortest axial dimension previously 0.6 cm (series 2, image 64). Reproductive: Prostatic calcifications with mild prostatomegaly. Other: No ascites Musculoskeletal: Multilevel degenerative change of the spine. No suspicious lytic or blastic lesions of bone. IMPRESSION: 1. Slightly increased soft tissue thickening of the bladder wall with bilateral hydroureteronephrosis to the level of the UVJ which is slightly increased on the left but stable on the right. Findings concerning for disease progression. 2. Increased size and number of the index and non index retroperitoneal and pelvic lymph nodes, concerning for increased nodal metastatic disease. 3. Aortic atherosclerosis. Aortic Atherosclerosis (  ICD10-I70.0). Electronically Signed   By: Dahlia Bailiff MD   On: 10/03/2020 16:51   IR NEPHROSTOMY PLACEMENT LEFT  Result Date: 10/10/2020 CLINICAL DATA:  Neoplasm of urinary bladder, worsening bilateral hydronephrosis EXAM: BILATERAL PERCUTANEOUS  NEPHROSTOMY CATHETER PLACEMENT UNDER ULTRASOUND AND FLUOROSCOPIC GUIDANCE TECHNIQUE: The procedure, risks (including but not limited to bleeding, infection, organ damage ), benefits, and alternatives were explained to the patient. Questions regarding the procedure were encouraged and answered. The patient understands and consents to the procedure. Bilateralflank regions prepped with Betadine, draped in usual sterile fashion, infiltrated locally with 1% lidocaine.Patient was already receiving adequate prophylactic antibiotic coverage. Intravenous Fentanyl 122mg and Versed 1.51mwere administered as conscious sedation during continuous monitoring of the patient's level of consciousness and physiological / cardiorespiratory status by the radiology RN, with a total moderate sedation time of 19 minutes. Under real-time ultrasound guidance, a 21-gauge trocar needle was advanced into a posterior lower pole calyx of the right renal collecting system. Ultrasound image documentation was saved. Urine spontaneously returned through the needle. Needle was exchanged over a guidewire for transitional dilator. Contrast injection confirmed appropriate positioning. Catheter was exchanged over a guidewire for a 10 French pigtail catheter, formed centrally within the right renal collecting system. Contrast injection confirms appropriate positioning and patency. In similar fashion, under real-time ultrasound guidance, a 21-gauge trocar needle was advanced into a posterior lower pole calyx of the left renal collecting system. Ultrasound image documentation was saved. Urine spontaneously returned through the needle. Needle was exchanged over a guidewire for transitional dilator. Contrast injection confirmed appropriate positioning. Catheter was exchanged over a guidewire for a 10 French pigtail catheter, formed centrally within the left renal collecting system. Contrast injection confirms appropriate positioning and Both catheters  secured externally with 0 Prolene suture and StatLock, and placed to external drain bags. The patient tolerated the procedure well. FLUOROSCOPY TIME:  2 minutes 36 second COMPLICATIONS: COMPLICATIONS none IMPRESSION: 1. Technically successful bilateral percutaneous nephrostomy catheter placement. Electronically Signed   By: D Lucrezia Europe.D.   On: 10/10/2020 16:09   IR NEPHROSTOMY PLACEMENT RIGHT  Result Date: 10/10/2020 CLINICAL DATA:  Neoplasm of urinary bladder, worsening bilateral hydronephrosis EXAM: BILATERAL PERCUTANEOUS NEPHROSTOMY CATHETER PLACEMENT UNDER ULTRASOUND AND FLUOROSCOPIC GUIDANCE TECHNIQUE: The procedure, risks (including but not limited to bleeding, infection, organ damage ), benefits, and alternatives were explained to the patient. Questions regarding the procedure were encouraged and answered. The patient understands and consents to the procedure. Bilateralflank regions prepped with Betadine, draped in usual sterile fashion, infiltrated locally with 1% lidocaine.Patient was already receiving adequate prophylactic antibiotic coverage. Intravenous Fentanyl 10018mand Versed 1.5mg54mre administered as conscious sedation during continuous monitoring of the patient's level of consciousness and physiological / cardiorespiratory status by the radiology RN, with a total moderate sedation time of 19 minutes. Under real-time ultrasound guidance, a 21-gauge trocar needle was advanced into a posterior lower pole calyx of the right renal collecting system. Ultrasound image documentation was saved. Urine spontaneously returned through the needle. Needle was exchanged over a guidewire for transitional dilator. Contrast injection confirmed appropriate positioning. Catheter was exchanged over a guidewire for a 10 French pigtail catheter, formed centrally within the right renal collecting system. Contrast injection confirms appropriate positioning and patency. In similar fashion, under real-time ultrasound  guidance, a 21-gauge trocar needle was advanced into a posterior lower pole calyx of the left renal collecting system. Ultrasound image documentation was saved. Urine spontaneously returned through the needle. Needle was exchanged over a guidewire for transitional dilator.  Contrast injection confirmed appropriate positioning. Catheter was exchanged over a guidewire for a 10 French pigtail catheter, formed centrally within the left renal collecting system. Contrast injection confirms appropriate positioning and Both catheters secured externally with 0 Prolene suture and StatLock, and placed to external drain bags. The patient tolerated the procedure well. FLUOROSCOPY TIME:  2 minutes 36 second COMPLICATIONS: COMPLICATIONS none IMPRESSION: 1. Technically successful bilateral percutaneous nephrostomy catheter placement. Electronically Signed   By: Lucrezia Europe M.D.   On: 10/10/2020 16:09   IR IMAGING GUIDED PORT INSERTION  Result Date: 09/23/2020 INDICATION: History of metastatic bladder cancer. In need of durable intravenous access for chemotherapy administration. EXAM: IMPLANTED PORT A CATH PLACEMENT WITH ULTRASOUND AND FLUOROSCOPIC GUIDANCE COMPARISON:  CT of the chest, abdomen and pelvis-06/12/2020 MEDICATIONS: Ancef 2 gm IV; The antibiotic was administered within an appropriate time interval prior to skin puncture. ANESTHESIA/SEDATION: Moderate (conscious) sedation was employed during this procedure. A total of Versed 1.5 mg and Fentanyl 75 mcg was administered intravenously. Moderate Sedation Time: 25 minutes. The patient's level of consciousness and vital signs were monitored continuously by radiology nursing throughout the procedure under my direct supervision. CONTRAST:  None FLUOROSCOPY TIME:  48 seconds (11 mGy) COMPLICATIONS: None immediate. PROCEDURE: The procedure, risks, benefits, and alternatives were explained to the patient. Questions regarding the procedure were encouraged and answered. The patient  understands and consents to the procedure. The right neck and chest were prepped with chlorhexidine in a sterile fashion, and a sterile drape was applied covering the operative field. Maximum barrier sterile technique with sterile gowns and gloves were used for the procedure. A timeout was performed prior to the initiation of the procedure. Local anesthesia was provided with 1% lidocaine with epinephrine. After creating a small venotomy incision, a micropuncture kit was utilized to access the internal jugular vein. Real-time ultrasound guidance was utilized for vascular access including the acquisition of a permanent ultrasound image documenting patency of the accessed vessel. The microwire was utilized to measure appropriate catheter length. A subcutaneous port pocket was then created along the upper chest wall utilizing a combination of sharp and blunt dissection. The pocket was irrigated with sterile saline. A single lumen "Slim" sized power injectable port was chosen for placement. The 8 Fr catheter was tunneled from the port pocket site to the venotomy incision. The port was placed in the pocket. The external catheter was trimmed to appropriate length. At the venotomy, an 8 Fr peel-away sheath was placed over a guidewire under fluoroscopic guidance. The catheter was then placed through the sheath and the sheath was removed. Final catheter positioning was confirmed and documented with a fluoroscopic spot radiograph. The port was accessed with a Huber needle, aspirated and flushed with heparinized saline. The venotomy site was closed with an interrupted 4-0 Vicryl suture. The port pocket incision was closed with interrupted 2-0 Vicryl suture. The skin was opposed with a running subcuticular 4-0 Vicryl suture. Dermabond and Steri-strips were applied to both incisions. Dressings were applied. The patient tolerated the procedure well without immediate post procedural complication. FINDINGS: After catheter placement,  the tip lies within the superior cavoatrial junction. The catheter aspirates and flushes normally and is ready for immediate use. IMPRESSION: Successful placement of a right internal jugular approach power injectable Port-A-Cath. The catheter is ready for immediate use. Electronically Signed   By: Sandi Mariscal M.D.   On: 09/23/2020 13:17      Subjective: No issues overnight.  Discharge Exam: Vitals:   10/12/20 0437 10/12/20  1245  BP: 113/70 109/68  Pulse: 79 79  Resp: 16 18  Temp: 98.1 F (36.7 C) (!) 97.3 F (36.3 C)  SpO2: 99% 99%   Vitals:   10/11/20 0541 10/11/20 2128 10/12/20 0437 10/12/20 1245  BP: 115/68 123/73 113/70 109/68  Pulse: 68 72 79 79  Resp: '17 18 16 18  ' Temp: 98 F (36.7 C) 97.9 F (36.6 C) 98.1 F (36.7 C) (!) 97.3 F (36.3 C)  TempSrc: Oral  Oral Oral  SpO2: 100% 96% 99% 99%  Weight:      Height:        General: Pt is alert, awake, not in acute distress Cardiovascular: RRR, S1/S2 +, no rubs, no gallops Respiratory: CTA bilaterally, no wheezing, no rhonchi Abdominal: Soft, NT, ND, bowel sounds + Extremities: no edema, no cyanosis    The results of significant diagnostics from this hospitalization (including imaging, microbiology, ancillary and laboratory) are listed below for reference.     Microbiology: Recent Results (from the past 240 hour(s))  SARS CORONAVIRUS 2 (TAT 6-24 HRS) Nasopharyngeal Nasopharyngeal Swab     Status: None   Collection Time: 10/09/20  1:10 PM   Specimen: Nasopharyngeal Swab  Result Value Ref Range Status   SARS Coronavirus 2 NEGATIVE NEGATIVE Final    Comment: (NOTE) SARS-CoV-2 target nucleic acids are NOT DETECTED.  The SARS-CoV-2 RNA is generally detectable in upper and lower respiratory specimens during the acute phase of infection. Negative results do not preclude SARS-CoV-2 infection, do not rule out co-infections with other pathogens, and should not be used as the sole basis for treatment or other patient  management decisions. Negative results must be combined with clinical observations, patient history, and epidemiological information. The expected result is Negative.  Fact Sheet for Patients: SugarRoll.be  Fact Sheet for Healthcare Providers: https://www.woods-mathews.com/  This test is not yet approved or cleared by the [place] FDA and  has been authorized for detection and/or diagnosis of SARS-CoV-2 by FDA under an Emergency Use Authorization (EUA). This EUA will remain  in effect (meaning this test can be used) for the duration of the COVID-19 declaration under Se ction 564(b)(1) of the Act, 21 U.S.C. section 360bbb-3(b)(1), unless the authorization is terminated or revoked sooner.  Performed at [facility], [place] [address] [place], [place] [address]   Urine culture     Status: Abnormal   Collection Time: 10/09/20  1:10 PM   Specimen: Urine, Random  Result Value Ref Range Status   Specimen Description   Final    URINE, RANDOM Performed at [facility] [address], [place], [place] [address]    Special Requests   Final    NONE Performed at [facility], [place] [address] [place], [place] [address]    Culture 40,000 COLONIES/mL CITROBACTER KOSERI (A)  Final   Report Status 10/11/2020 FINAL  Final   Organism ID, Bacteria CITROBACTER KOSERI (A)  Final      Susceptibility   Citrobacter koseri - MIC*    CEFAZOLIN <=4 SENSITIVE Sensitive     CEFEPIME <=0.12 SENSITIVE Sensitive     CEFTRIAXONE <=0.25 SENSITIVE Sensitive     CIPROFLOXACIN <=0.25 SENSITIVE Sensitive     GENTAMICIN <=1 SENSITIVE Sensitive     IMIPENEM <=0.25 SENSITIVE Sensitive     NITROFURANTOIN <=16 SENSITIVE Sensitive     TRIMETH/SULFA <=20 SENSITIVE Sensitive     PIP/TAZO <=4 SENSITIVE Sensitive     * 40,000 COLONIES/mL CITROBACTER KOSERI  Culture,  blood (routine x 2)     Status: None (Preliminary result)    Collection Time: 10/09/20  5:35 PM   Specimen: BLOOD  Result Value Ref Range Status   Specimen Description   Final    BLOOD PORTA CATH Performed at [facility] [address], [place], [place] [address]    Special Requests   Final    BOTTLES DRAWN AEROBIC AND ANAEROBIC Blood Culture adequate volume Performed at [facility] [address], [place], [place] [address]    Culture   Final    NO GROWTH 3 DAYS Performed at [facility], [place] [address], [place], [place] [address]    Report Status PENDING  Incomplete  Culture, blood (routine x 2)     Status: None (Preliminary result)   Collection Time: 10/09/20  6:40 PM   Specimen: BLOOD  Result Value Ref Range Status   Specimen Description   Final    BLOOD SITE NOT SPECIFIED Performed at [facility] [address], [place], [place] [address]    Special Requests   Final    BOTTLES DRAWN AEROBIC AND ANAEROBIC Blood Culture adequate volume Performed at [facility] [address], [place], [place] [address]    Culture   Final    NO GROWTH 3 DAYS Performed at [facility], [place] [address] [place], [place] [address]    Report Status PENDING  Incomplete     Labs: BNP (last 3 results) No results for input(s): BNP in the last 8760 hours. Basic Metabolic Panel: Recent Labs  Lab 10/09/20 0813 10/09/20 1310 10/10/20 0420 10/11/20 0341 10/12/20 0300 10/12/20 1558  NA 137 136 138 141 141  --   K 5.8* 6.1* 4.5 3.8 3.4*  --   CL 115* 113* 111 111 113*  --   CO2 <10* 9* 12* 15* 18*  --   GLUCOSE 138* 117* 129* 108* 151*  --   BUN 79* 88* 92* 92* 75*  --   CREATININE 6.66* 7.01* 7.48* 6.62* 4.28* 4.15*  CALCIUM 9.1 9.4 8.6* 8.5* 8.6*  --   MG  --  1.8  --   --   --   --   PHOS  --  7.0* 7.7*  --   --   --    Liver Function Tests: Recent Labs  Lab 10/09/20 0813 10/09/20 1310 10/10/20 0420  AST '21 22 20  ' ALT '22 24 21  ' ALKPHOS 98 104 86  BILITOT  0.2* 0.5 0.5  PROT 7.4 7.9 6.5  ALBUMIN 2.9* 3.4* 3.0*   No results for input(s): LIPASE, AMYLASE in the last 168 hours. No results for input(s): AMMONIA in the last 168 hours. CBC: Recent Labs  Lab 10/09/20 0813 10/09/20 1310 10/10/20 0420 10/11/20 0341 10/12/20 0300  WBC 12.9* 14.7* 12.8* 11.5* 11.0*  NEUTROABS 9.8*  --   --   --   --   HGB 11.5* 12.2* 10.7* 10.1* 9.9*  HCT 36.9* 39.5 33.4* 31.7* 30.9*  MCV 96.3 97.5 96.8 95.8 95.4  PLT 268 298 231 222 239   Cardiac Enzymes: No results for input(s): CKTOTAL, CKMB, CKMBINDEX, TROPONINI in the last 168 hours. BNP: Invalid input(s): POCBNP CBG: No results for input(s): GLUCAP in the last 168 hours. D-Dimer No results for input(s): DDIMER in the last 72 hours. Hgb A1c No results for input(s): HGBA1C in the last 72 hours. Lipid Profile No results for input(s): CHOL, HDL, LDLCALC,  TRIG, CHOLHDL, LDLDIRECT in the last 72 hours. Thyroid function studies No results for input(s): TSH, T4TOTAL, T3FREE, THYROIDAB in the last 72 hours.  Invalid input(s): FREET3 Anemia work up No results for input(s): VITAMINB12, FOLATE, FERRITIN, TIBC, IRON, RETICCTPCT in the last 72 hours. Urinalysis    Component Value Date/Time   COLORURINE YELLOW 10/09/2020 1310   APPEARANCEUR CLOUDY (A) 10/09/2020 1310   LABSPEC 1.014 10/09/2020 1310   PHURINE 5.0 10/09/2020 1310   GLUCOSEU NEGATIVE 10/09/2020 1310   HGBUR MODERATE (A) 10/09/2020 1310   BILIRUBINUR NEGATIVE 10/09/2020 1310   BILIRUBINUR negative 04/28/2019 1202   KETONESUR NEGATIVE 10/09/2020 1310   PROTEINUR 100 (A) 10/09/2020 1310   UROBILINOGEN 1.0 04/28/2019 1202   NITRITE NEGATIVE 10/09/2020 1310   LEUKOCYTESUR LARGE (A) 10/09/2020 1310   Sepsis Labs Invalid input(s): PROCALCITONIN,  WBC,  LACTICIDVEN Microbiology Recent Results (from the past 240 hour(s))  SARS CORONAVIRUS 2 (TAT 6-24 HRS) Nasopharyngeal Nasopharyngeal Swab     Status: None   Collection Time: 10/09/20  1:10  PM   Specimen: Nasopharyngeal Swab  Result Value Ref Range Status   SARS Coronavirus 2 NEGATIVE NEGATIVE Final    Comment: (NOTE) SARS-CoV-2 target nucleic acids are NOT DETECTED.  The SARS-CoV-2 RNA is generally detectable in upper and lower respiratory specimens during the acute phase of infection. Negative results do not preclude SARS-CoV-2 infection, do not rule out co-infections with other pathogens, and should not be used as the sole basis for treatment or other patient management decisions. Negative results must be combined with clinical observations, patient history, and epidemiological information. The expected result is Negative.  Fact Sheet for Patients: SugarRoll.be  Fact Sheet for Healthcare Providers: https://www.woods-mathews.com/  This test is not yet approved or cleared by the [place] FDA and  has been authorized for detection and/or diagnosis of SARS-CoV-2 by FDA under an Emergency Use Authorization (EUA). This EUA will remain  in effect (meaning this test can be used) for the duration of the COVID-19 declaration under Se ction 564(b)(1) of the Act, 21 U.S.C. section 360bbb-3(b)(1), unless the authorization is terminated or revoked sooner.  Performed at [facility], [place] [address], [place], [place] [address]   Urine culture     Status: Abnormal   Collection Time: 10/09/20  1:10 PM   Specimen: Urine, Random  Result Value Ref Range Status   Specimen Description   Final    URINE, RANDOM Performed at [facility] [address] [place], [place] [address]    Special Requests   Final    NONE Performed at [facility], [place] [address] [place], [place] [address]    Culture 40,000 COLONIES/mL CITROBACTER KOSERI (A)  Final   Report Status 10/11/2020 FINAL  Final   Organism ID, Bacteria CITROBACTER KOSERI (A)  Final      Susceptibility   Citrobacter koseri - MIC*     CEFAZOLIN <=4 SENSITIVE Sensitive     CEFEPIME <=0.12 SENSITIVE Sensitive     CEFTRIAXONE <=0.25 SENSITIVE Sensitive     CIPROFLOXACIN <=0.25 SENSITIVE Sensitive     GENTAMICIN <=1 SENSITIVE Sensitive     IMIPENEM <=0.25 SENSITIVE Sensitive     NITROFURANTOIN <=16 SENSITIVE Sensitive     TRIMETH/SULFA <=20 SENSITIVE Sensitive     PIP/TAZO <=4 SENSITIVE Sensitive     * 40,000 COLONIES/mL CITROBACTER KOSERI  Culture, blood (routine x 2)     Status: None (Preliminary result)   Collection Time: 10/09/20  5:35 PM  Specimen: BLOOD  Result Value Ref Range Status   Specimen Description   Final    BLOOD PORTA CATH Performed at [facility] [address], [place], [place] [address]    Special Requests   Final    BOTTLES DRAWN AEROBIC AND ANAEROBIC Blood Culture adequate volume Performed at [facility] [address] [place], [place] [address]    Culture   Final    NO GROWTH 3 DAYS Performed at [facility], [place] [address], [place], [place] [address]    Report Status PENDING  Incomplete  Culture, blood (routine x 2)     Status: None (Preliminary result)   Collection Time: 10/09/20  6:40 PM   Specimen: BLOOD  Result Value Ref Range Status   Specimen Description   Final    BLOOD SITE NOT SPECIFIED Performed at [facility] [address], [place], [place] [address]    Special Requests   Final    BOTTLES DRAWN AEROBIC AND ANAEROBIC Blood Culture adequate volume Performed at [facility] [address], [place], [place] [address]    Culture   Final    NO GROWTH 3 DAYS Performed at [facility], [place] [address] [place], [place] [address]    Report Status PENDING  Incomplete     Time coordinating discharge: 35 minutes  SIGNED:   Cordelia Poche, MD [facility] 10/12/2020, 5:03 PM

## 2020-10-12 NOTE — Progress Notes (Incomplete)
PROGRESS NOTE    Blake Peterson  EXN:[id number] DOB: Oct 04, 1940 DOA: 10/09/2020 PCP: Ria Bush, MD   Brief Narrative: Blake Peterson is a 80 y.o. male with a history of hypertension, hyperlipidemia, bladder cancer. Patient presented secondary to abnormal creatinine with evidence of obstructive uropathy causing AKI.   Assessment & Plan:   Principal Problem:   Acute renal failure (HCC) Active Problems:   HTN (hypertension)   Bladder cancer (HCC)   Acute bilateral obstructive uropathy   AKI Acute bilateral obstructive uropathy AKI complicated by enalapril, chlorthalidone use in setting of obstructive uropathy. Obstructive uropathy in setting of known urinary bladder cancer. Urology consulted on admission with recommendations for bilateral nephrostomy tubes which were placed on 1/20. Creatinine of 6.66 on admission and has peaked at 7.48. Now trending down with a creatinine of 4.28 today. -Daily BMP -Urology recommendations: None available: Re-page for patient evaluation today  Gross hematuria Noted. Concern for possible UTI but also in setting of known metastatic urothelial carcinoma. Ceftriaxone started empirically. Hemoglobin continues to decline slowly. Patient with continued gross hematuria from foley catheter. Some hematuria from left nephrostomy tube. -Daily CBC -Urology recommendations  UTI/Pyelonephritis Symptoms of hesitancy and gross hematuria. Urine culture significant for 40k colonies of citrobacter koseri. Started on empiric Ceftriaxone on admission. Organism is pan-sensitive. -Continue Ceftriaxone IV while awaiting continued improvement of renal function  Primary hypertension Patient is on atenolol 50 mg daily, enalapril 10 mg BID and chlorthalidone 25 mg daily. Antihypertensives held on admission -Continue home atenolol 50 mg daily -Continue to hold enalapril and chlorthalidone in setting of severe kidney injury  Hyperlipidemia Patient is on Lipitor  and Omega 3 fatty acids as an outpatient -Continue Lipitor and Lovaza  Stage IV high grade urothelial carcinoma Patient follows with Dr. Alen Blew and Dr. Tresa Moore as an outpatient. Associated lymph node involvement. Recently started on palliative chemotherapy.   DVT prophylaxis: SCDs Code Status:   Code Status: Full Code Family Communication: None at bedside Disposition Plan: Discharge likely in several days pending improvement of AKI in addition to urology recommendations   Consultants:   Urology  Interventional radiology  Procedures:   BILATERAL PERCUTANEOUS NEPHROSTOMY TUBE PLACEMENT (10/10/20)  Antimicrobials:  Ceftriaxone IV    Subjective: No significant issues, although he feels a little tired.  Objective: Vitals:   10/11/20 0209 10/11/20 0541 10/11/20 2128 10/12/20 0437  BP: 108/60 115/68 123/73 113/70  Pulse: 64 68 72 79  Resp: 16 17 18 16   Temp: 98.7 F (37.1 C) 98 F (36.7 C) 97.9 F (36.6 C) 98.1 F (36.7 C)  TempSrc: Oral Oral  Oral  SpO2: 100% 100% 96% 99%  Weight:      Height:        Intake/Output Summary (Last 24 hours) at 10/12/2020 0944 Last data filed at 10/12/2020 0900 Gross per 24 hour  Intake 140 ml  Output 2250 ml  Net -2110 ml   Filed Weights   10/09/20 1317  Weight: 82.6 kg    Examination:  General exam: Appears calm and comfortable Respiratory system: Clear to auscultation. Respiratory effort normal. Cardiovascular system: S1 & S2 heard, RRR. No murmurs, rubs, gallops or clicks. Gastrointestinal system: Abdomen is slightly distended, soft and nontender. No organomegaly or masses felt. Normal bowel sounds heard. Central nervous system: Alert and oriented. No focal neurological deficits. Musculoskeletal: No edema. No calf tenderness Skin: No cyanosis. No rashes Psychiatry: Judgement and insight appear normal. Mood & affect appropriate.     Data Reviewed: I have  personally reviewed following labs and imaging studies  CBC Lab  Results  Component Value Date   WBC 11.0 (H) 10/12/2020   RBC 3.24 (L) 10/12/2020   HGB 9.9 (L) 10/12/2020   HCT 30.9 (L) 10/12/2020   MCV 95.4 10/12/2020   MCH 30.6 10/12/2020   PLT 239 10/12/2020   MCHC 32.0 10/12/2020   RDW 14.6 10/12/2020   LYMPHSABS 1.4 10/09/2020   MONOABS 1.3 (H) 10/09/2020   EOSABS 0.2 10/09/2020   BASOSABS 0.1 53/66/4403     Last metabolic panel Lab Results  Component Value Date   NA 141 10/12/2020   K 3.4 (L) 10/12/2020   CL 113 (H) 10/12/2020   CO2 18 (L) 10/12/2020   BUN 75 (H) 10/12/2020   CREATININE 4.28 (H) 10/12/2020   GLUCOSE 151 (H) 10/12/2020   GFRNONAA 14 (L) 10/12/2020   GFRAA 30 (L) 06/19/2020   CALCIUM 8.6 (L) 10/12/2020   PHOS 7.7 (H) 10/10/2020   PROT 6.5 10/10/2020   ALBUMIN 3.0 (L) 10/10/2020   BILITOT 0.5 10/10/2020   ALKPHOS 86 10/10/2020   AST 20 10/10/2020   ALT 21 10/10/2020   ANIONGAP 10 10/12/2020    CBG (last 3)  No results for input(s): GLUCAP in the last 72 hours.   GFR: Estimated Creatinine Clearance: 15.1 mL/min (A) (by C-G formula based on SCr of 4.28 mg/dL (H)).  Coagulation Profile: Recent Labs  Lab 10/10/20 0420  INR 1.3*    Recent Results (from the past 240 hour(s))  SARS CORONAVIRUS 2 (TAT 6-24 HRS) Nasopharyngeal Nasopharyngeal Swab     Status: None   Collection Time: 10/09/20  1:10 PM   Specimen: Nasopharyngeal Swab  Result Value Ref Range Status   SARS Coronavirus 2 NEGATIVE NEGATIVE Final    Comment: (NOTE) SARS-CoV-2 target nucleic acids are NOT DETECTED.  The SARS-CoV-2 RNA is generally detectable in upper and lower respiratory specimens during the acute phase of infection. Negative results do not preclude SARS-CoV-2 infection, do not rule out co-infections with other pathogens, and should not be used as the sole basis for treatment or other patient management decisions. Negative results must be combined with clinical observations, patient history, and epidemiological information.  The expected result is Negative.  Fact Sheet for Patients: SugarRoll.be  Fact Sheet for Healthcare Providers: https://www.woods-mathews.com/  This test is not yet approved or cleared by the [place] FDA and  has been authorized for detection and/or diagnosis of SARS-CoV-2 by FDA under an Emergency Use Authorization (EUA). This EUA will remain  in effect (meaning this test can be used) for the duration of the COVID-19 declaration under Se ction 564(b)(1) of the Act, 21 U.S.C. section 360bbb-3(b)(1), unless the authorization is terminated or revoked sooner.  Performed at [facility], [place] [address] [place], [place] [address]   Urine culture     Status: Abnormal   Collection Time: 10/09/20  1:10 PM   Specimen: Urine, Random  Result Value Ref Range Status   Specimen Description   Final    URINE, RANDOM Performed at [facility] [address] [place], [place] [address]    Special Requests   Final    NONE Performed at [facility], [place] [address], [place], [place] [address]    Culture 40,000 COLONIES/mL CITROBACTER KOSERI (A)  Final   Report Status 10/11/2020 FINAL  Final   Organism ID, Bacteria CITROBACTER KOSERI (A)  Final      Susceptibility   Citrobacter koseri -  MIC*    CEFAZOLIN <=4 SENSITIVE Sensitive     CEFEPIME <=0.12 SENSITIVE Sensitive     CEFTRIAXONE <=0.25 SENSITIVE Sensitive     CIPROFLOXACIN <=0.25 SENSITIVE Sensitive     GENTAMICIN <=1 SENSITIVE Sensitive     IMIPENEM <=0.25 SENSITIVE Sensitive     NITROFURANTOIN <=16 SENSITIVE Sensitive     TRIMETH/SULFA <=20 SENSITIVE Sensitive     PIP/TAZO <=4 SENSITIVE Sensitive     * 40,000 COLONIES/mL CITROBACTER KOSERI  Culture, blood (routine x 2)     Status: None (Preliminary result)   Collection Time: 10/09/20  5:35 PM   Specimen: BLOOD  Result Value Ref Range Status   Specimen Description   Final    BLOOD PORTA  CATH Performed at [facility] [address] [place], [place] [address]    Special Requests   Final    BOTTLES DRAWN AEROBIC AND ANAEROBIC Blood Culture adequate volume Performed at [facility] [address], [place], [place] [address]    Culture   Final    NO GROWTH 2 DAYS Performed at [facility] [address] [place], [place] [address]    Report Status PENDING  Incomplete  Culture, blood (routine x 2)     Status: None (Preliminary result)   Collection Time: 10/09/20  6:40 PM   Specimen: BLOOD  Result Value Ref Range Status   Specimen Description   Final    BLOOD SITE NOT SPECIFIED Performed at [facility] [address] [place], [place] [address]    Special Requests   Final    BOTTLES DRAWN AEROBIC AND ANAEROBIC Blood Culture adequate volume Performed at [facility] [address], [place], [place] [address]    Culture   Final    NO GROWTH 2 DAYS Performed at [facility] [address], [place], [place] [address]    Report Status PENDING  Incomplete        Radiology Studies: IR NEPHROSTOMY PLACEMENT LEFT  Result Date: 10/10/2020 CLINICAL DATA:  Neoplasm of urinary bladder, worsening bilateral hydronephrosis EXAM: BILATERAL PERCUTANEOUS NEPHROSTOMY CATHETER PLACEMENT UNDER ULTRASOUND AND FLUOROSCOPIC GUIDANCE TECHNIQUE: The procedure, risks (including but not limited to bleeding, infection, organ damage ), benefits, and alternatives were explained to the patient. Questions regarding the procedure were encouraged and answered. The patient understands and consents to the procedure. Bilateralflank regions prepped with Betadine, draped in usual sterile fashion, infiltrated locally with 1% lidocaine.Patient was already receiving adequate prophylactic antibiotic coverage. Intravenous Fentanyl 161mcg and Versed 1.5mg  were administered as conscious sedation during continuous monitoring of the  patient's level of consciousness and physiological / cardiorespiratory status by the radiology RN, with a total moderate sedation time of 19 minutes. Under real-time ultrasound guidance, a 21-gauge trocar needle was advanced into a posterior lower pole calyx of the right renal collecting system. Ultrasound image documentation was saved. Urine spontaneously returned through the needle. Needle was exchanged over a guidewire for transitional dilator. Contrast injection confirmed appropriate positioning. Catheter was exchanged over a guidewire for a 10 French pigtail catheter, formed centrally within the right renal collecting system. Contrast injection confirms appropriate positioning and patency. In similar fashion, under real-time ultrasound guidance, a 21-gauge trocar needle was advanced into a posterior lower pole calyx of the left renal collecting system. Ultrasound image documentation was saved. Urine spontaneously returned through the needle. Needle was exchanged over a guidewire for transitional dilator. Contrast injection confirmed appropriate positioning. Catheter was exchanged over  a guidewire for a 10 French pigtail catheter, formed centrally within the left renal collecting system. Contrast injection confirms appropriate positioning and Both catheters secured externally with 0 Prolene suture and StatLock, and placed to external drain bags. The patient tolerated the procedure well. FLUOROSCOPY TIME:  2 minutes 36 second COMPLICATIONS: COMPLICATIONS none IMPRESSION: 1. Technically successful bilateral percutaneous nephrostomy catheter placement. Electronically Signed   By: Lucrezia Europe M.D.   On: 10/10/2020 16:09   IR NEPHROSTOMY PLACEMENT RIGHT  Result Date: 10/10/2020 CLINICAL DATA:  Neoplasm of urinary bladder, worsening bilateral hydronephrosis EXAM: BILATERAL PERCUTANEOUS NEPHROSTOMY CATHETER PLACEMENT UNDER ULTRASOUND AND FLUOROSCOPIC GUIDANCE TECHNIQUE: The procedure, risks (including but not  limited to bleeding, infection, organ damage ), benefits, and alternatives were explained to the patient. Questions regarding the procedure were encouraged and answered. The patient understands and consents to the procedure. Bilateralflank regions prepped with Betadine, draped in usual sterile fashion, infiltrated locally with 1% lidocaine.Patient was already receiving adequate prophylactic antibiotic coverage. Intravenous Fentanyl 127mcg and Versed 1.5mg  were administered as conscious sedation during continuous monitoring of the patient's level of consciousness and physiological / cardiorespiratory status by the radiology RN, with a total moderate sedation time of 19 minutes. Under real-time ultrasound guidance, a 21-gauge trocar needle was advanced into a posterior lower pole calyx of the right renal collecting system. Ultrasound image documentation was saved. Urine spontaneously returned through the needle. Needle was exchanged over a guidewire for transitional dilator. Contrast injection confirmed appropriate positioning. Catheter was exchanged over a guidewire for a 10 French pigtail catheter, formed centrally within the right renal collecting system. Contrast injection confirms appropriate positioning and patency. In similar fashion, under real-time ultrasound guidance, a 21-gauge trocar needle was advanced into a posterior lower pole calyx of the left renal collecting system. Ultrasound image documentation was saved. Urine spontaneously returned through the needle. Needle was exchanged over a guidewire for transitional dilator. Contrast injection confirmed appropriate positioning. Catheter was exchanged over a guidewire for a 10 French pigtail catheter, formed centrally within the left renal collecting system. Contrast injection confirms appropriate positioning and Both catheters secured externally with 0 Prolene suture and StatLock, and placed to external drain bags. The patient tolerated the procedure well.  FLUOROSCOPY TIME:  2 minutes 36 second COMPLICATIONS: COMPLICATIONS none IMPRESSION: 1. Technically successful bilateral percutaneous nephrostomy catheter placement. Electronically Signed   By: Lucrezia Europe M.D.   On: 10/10/2020 16:09        Scheduled Meds: . atorvastatin  80 mg Oral Daily  . Chlorhexidine Gluconate Cloth  6 each Topical Daily  . cholecalciferol  2,000 Units Oral Daily  . feeding supplement (NEPRO CARB STEADY)  237 mL Oral Q24H  . folic acid  1 mg Oral Daily  . omega-3 acid ethyl esters  1 g Oral Daily  . sodium chloride flush  5 mL Intracatheter Q8H  . vitamin B-12  1,000 mcg Oral Daily   Continuous Infusions: . cefTRIAXone (ROCEPHIN)  IV 2 g (10/11/20 1843)     LOS: 3 days     Cordelia Poche, MD [facility] 10/12/2020, 9:44 AM  If 7PM-7AM, please contact night-coverage [email or web address]

## 2020-10-12 NOTE — TOC Progression Note (Signed)
Transition of Care [facility]) - Progression Note    Patient Details  Name: Blake Peterson MRN: [id number] Date of Birth: [birth year]  [facility] CM/SW Contact  Joaquin Courts, RN Phone Number: 10/12/2020, 5:14 PM  Clinical Narrative:    CM spoke with patient regarding discharge planning.  CM attempted to arrange [facility] services, however every agency declined stating either out of network with insurance or lack of staffing.  Patient and MD was made aware of this.  Patient reports he and spouse feel comfortable managing nephrostomy tubes at home and have received education from bedside nurses.    Expected Discharge Plan: [place] Barriers to Discharge: No Barriers Identified  Expected Discharge Plan and Services Expected Discharge Plan: [place]   Discharge Planning Services: CM Consult Post Acute Care Choice: [place] arrangements for the past 2 months: Single Family Home Expected Discharge Date: 10/12/20                                     Social Determinants of Health (SDOH) Interventions    Readmission Risk Interventions No flowsheet data found.

## 2020-10-12 NOTE — Progress Notes (Signed)
Nurse reviewed discharge instructions with pt and his wife.  Pt verbalized understanding of discharge instructions, follow up appointments and new medications.  [name] feels comfortable with flushing pt's nephrostomy drains at home.  No concerns at time of discharge.  Nurse did flush pt's drain prior to discharge per pt and wife's request.

## 2020-10-12 NOTE — Discharge Instructions (Signed)
Blake Peterson,  You were in the hospital with kidney blockages requiring nephrostomy tubes to be placed. You also have a urinary infection. Please follow-up with your urologist and oncologist. With regard to your enalapril and chlorthalidone, please don't take these until your kidney function is back to baseline per your doctor's recommendations.

## 2020-10-14 ENCOUNTER — Telehealth: Payer: Self-pay

## 2020-10-14 LAB — CULTURE, BLOOD (ROUTINE X 2)
Culture: NO GROWTH
Culture: NO GROWTH
Special Requests: ADEQUATE
Special Requests: ADEQUATE

## 2020-10-14 NOTE — Telephone Encounter (Signed)
Transition Care Management Follow-up Telephone Call  Date of discharge and from where: 10/12/2020, Blake Peterson   How have you been since you were released from the hospital? Patient states that he is doing okay since getting home.   Any questions or concerns? No  Items Reviewed:  Did the pt receive and understand the discharge instructions provided? Yes   Medications obtained and verified? Yes   Other? No   Any new allergies since your discharge? No   Dietary orders reviewed? Yes  Do you have support at home? Yes   Home Care and Equipment/Supplies: Were home health services ordered? not applicable If so, what is the name of the agency? N/A  Has the agency set up a time to come to the patient's home? not applicable Were any new equipment or medical supplies ordered?  No What is the name of the medical supply agency? N/A Were you able to get the supplies/equipment? not applicable Do you have any questions related to the use of the equipment or supplies? No  Functional Questionnaire: (I = Independent and D = Dependent) ADLs: I  Bathing/Dressing- I  Meal Prep- I  Eating- I  Maintaining continence- I  Transferring/Ambulation- I  Managing Meds- I  Follow up appointments reviewed:   PCP Hospital f/u appt confirmed? Yes  Scheduled to see Dr. Danise Mina on 10/25/2020 @ 9 am.  [facility] f/u appt confirmed? Yes  Scheduled to see onocology  Are transportation arrangements needed? No   If their condition worsens, is the pt aware to call PCP or go to the Emergency Dept.? Yes  Was the patient provided with contact information for the PCP's office or ED? Yes  Was to pt encouraged to call back with questions or concerns? Yes

## 2020-10-15 ENCOUNTER — Ambulatory Visit: Payer: PPO

## 2020-10-15 ENCOUNTER — Other Ambulatory Visit: Payer: Self-pay

## 2020-10-15 ENCOUNTER — Inpatient Hospital Stay: Payer: PPO

## 2020-10-15 ENCOUNTER — Other Ambulatory Visit: Payer: PPO

## 2020-10-15 ENCOUNTER — Other Ambulatory Visit: Payer: Self-pay | Admitting: *Deleted

## 2020-10-15 ENCOUNTER — Ambulatory Visit: Payer: PPO | Admitting: Oncology

## 2020-10-15 VITALS — BP 144/81 | HR 78 | Temp 97.8°F | Resp 18 | Ht 67.5 in | Wt 176.5 lb

## 2020-10-15 DIAGNOSIS — N1330 Unspecified hydronephrosis: Secondary | ICD-10-CM | POA: Diagnosis not present

## 2020-10-15 DIAGNOSIS — N179 Acute kidney failure, unspecified: Secondary | ICD-10-CM | POA: Diagnosis not present

## 2020-10-15 DIAGNOSIS — C679 Malignant neoplasm of bladder, unspecified: Secondary | ICD-10-CM

## 2020-10-15 DIAGNOSIS — Z79899 Other long term (current) drug therapy: Secondary | ICD-10-CM | POA: Diagnosis not present

## 2020-10-15 DIAGNOSIS — Z5111 Encounter for antineoplastic chemotherapy: Secondary | ICD-10-CM | POA: Diagnosis not present

## 2020-10-15 DIAGNOSIS — Z452 Encounter for adjustment and management of vascular access device: Secondary | ICD-10-CM | POA: Diagnosis not present

## 2020-10-15 LAB — CBC WITH DIFFERENTIAL (CANCER CENTER ONLY)
Abs Immature Granulocytes: 0.38 10*3/uL — ABNORMAL HIGH (ref 0.00–0.07)
Basophils Absolute: 0.1 10*3/uL (ref 0.0–0.1)
Basophils Relative: 1 %
Eosinophils Absolute: 0.2 10*3/uL (ref 0.0–0.5)
Eosinophils Relative: 2 %
HCT: 35.1 % — ABNORMAL LOW (ref 39.0–52.0)
Hemoglobin: 11.5 g/dL — ABNORMAL LOW (ref 13.0–17.0)
Immature Granulocytes: 3 %
Lymphocytes Relative: 13 %
Lymphs Abs: 1.8 10*3/uL (ref 0.7–4.0)
MCH: 30.1 pg (ref 26.0–34.0)
MCHC: 32.8 g/dL (ref 30.0–36.0)
MCV: 91.9 fL (ref 80.0–100.0)
Monocytes Absolute: 1.3 10*3/uL — ABNORMAL HIGH (ref 0.1–1.0)
Monocytes Relative: 10 %
Neutro Abs: 9.6 10*3/uL — ABNORMAL HIGH (ref 1.7–7.7)
Neutrophils Relative %: 71 %
Platelet Count: 261 10*3/uL (ref 150–400)
RBC: 3.82 MIL/uL — ABNORMAL LOW (ref 4.22–5.81)
RDW: 14.5 % (ref 11.5–15.5)
WBC Count: 13.4 10*3/uL — ABNORMAL HIGH (ref 4.0–10.5)
nRBC: 0 % (ref 0.0–0.2)

## 2020-10-15 LAB — CMP (CANCER CENTER ONLY)
ALT: 42 U/L (ref 0–44)
AST: 36 U/L (ref 15–41)
Albumin: 2.8 g/dL — ABNORMAL LOW (ref 3.5–5.0)
Alkaline Phosphatase: 91 U/L (ref 38–126)
Anion gap: 11 (ref 5–15)
BUN: 44 mg/dL — ABNORMAL HIGH (ref 8–23)
CO2: 19 mmol/L — ABNORMAL LOW (ref 22–32)
Calcium: 8.9 mg/dL (ref 8.9–10.3)
Chloride: 109 mmol/L (ref 98–111)
Creatinine: 2.39 mg/dL — ABNORMAL HIGH (ref 0.61–1.24)
GFR, Estimated: 27 mL/min — ABNORMAL LOW (ref >60)
Glucose, Bld: 152 mg/dL — ABNORMAL HIGH (ref 70–99)
Potassium: 3.8 mmol/L (ref 3.5–5.1)
Sodium: 139 mmol/L (ref 135–145)
Total Bilirubin: 0.4 mg/dL (ref 0.3–1.2)
Total Protein: 7.4 g/dL (ref 6.5–8.1)

## 2020-10-15 MED ORDER — HEPARIN SOD (PORK) LOCK FLUSH 100 UNIT/ML IV SOLN
500.0000 [IU] | Freq: Once | INTRAVENOUS | Status: AC | PRN
  Administered 2020-10-15: 500 [IU]
  Filled 2020-10-15: qty 5

## 2020-10-15 MED ORDER — PALONOSETRON HCL INJECTION 0.25 MG/5ML
INTRAVENOUS | Status: AC
  Filled 2020-10-15: qty 5

## 2020-10-15 MED ORDER — PALONOSETRON HCL INJECTION 0.25 MG/5ML
0.2500 mg | Freq: Once | INTRAVENOUS | Status: AC
  Administered 2020-10-15: 0.25 mg via INTRAVENOUS

## 2020-10-15 MED ORDER — SODIUM CHLORIDE 0.9% FLUSH
10.0000 mL | INTRAVENOUS | Status: DC | PRN
  Administered 2020-10-15: 10 mL
  Filled 2020-10-15: qty 10

## 2020-10-15 MED ORDER — SODIUM CHLORIDE 0.9 % IV SOLN
Freq: Once | INTRAVENOUS | Status: AC
  Filled 2020-10-15: qty 250

## 2020-10-15 MED ORDER — DEXAMETHASONE SODIUM PHOSPHATE 100 MG/10ML IJ SOLN
10.0000 mg | Freq: Once | INTRAMUSCULAR | Status: AC
  Administered 2020-10-15: 10 mg via INTRAVENOUS
  Filled 2020-10-15: qty 10
  Filled 2020-10-15: qty 1

## 2020-10-15 MED ORDER — SODIUM CHLORIDE 0.9 % IV SOLN
150.0000 mg | Freq: Once | INTRAVENOUS | Status: AC
  Administered 2020-10-15: 150 mg via INTRAVENOUS
  Filled 2020-10-15: qty 150
  Filled 2020-10-15: qty 5

## 2020-10-15 MED ORDER — SODIUM CHLORIDE 0.9 % IV SOLN
2000.0000 mg | Freq: Once | INTRAVENOUS | Status: AC
  Administered 2020-10-15: 2000 mg via INTRAVENOUS
  Filled 2020-10-15: qty 52.6

## 2020-10-15 MED ORDER — SODIUM CHLORIDE 0.9 % IV SOLN
282.0000 mg | Freq: Once | INTRAVENOUS | Status: AC
  Administered 2020-10-15: 280 mg via INTRAVENOUS
  Filled 2020-10-15: qty 28

## 2020-10-15 MED ORDER — PROCHLORPERAZINE MALEATE 10 MG PO TABS: 10.0000 mg | ORAL_TABLET | Freq: Four times a day (QID) | ORAL | 0 refills | Status: DC | PRN

## 2020-10-15 NOTE — Progress Notes (Signed)
Per Dr. Alen Blew - okay to proceed with treatment today including carboplatin.

## 2020-10-15 NOTE — Patient Instructions (Signed)
[facility] Discharge Instructions for Patients Receiving Chemotherapy  Today you received the following chemotherapy agents: Gemcitabine (Gemzar) and Carboplatin  To help prevent nausea and vomiting after your treatment, we encourage you to take your nausea medication  as prescribed.    If you develop nausea and vomiting that is not controlled by your nausea medication, call the clinic.   BELOW ARE SYMPTOMS THAT SHOULD BE REPORTED IMMEDIATELY:  *FEVER GREATER THAN 100.5 F  *CHILLS WITH OR WITHOUT FEVER  NAUSEA AND VOMITING THAT IS NOT CONTROLLED WITH YOUR NAUSEA MEDICATION  *UNUSUAL SHORTNESS OF BREATH  *UNUSUAL BRUISING OR BLEEDING  TENDERNESS IN MOUTH AND THROAT WITH OR WITHOUT PRESENCE OF ULCERS  *URINARY PROBLEMS  *BOWEL PROBLEMS  UNUSUAL RASH Items with * indicate a potential emergency and should be followed up as soon as possible.  Feel free to call the clinic should you have any questions or concerns. The clinic phone number is [phone].  Please show the [facility] at check-in to the Emergency Department and triage nurse.  Carboplatin injection What is this medicine? CARBOPLATIN (KAR boe pla tin) is a chemotherapy drug. It targets fast dividing cells, like cancer cells, and causes these cells to die. This medicine is used to treat ovarian cancer and many other cancers. This medicine may be used for other purposes; ask your health care provider or pharmacist if you have questions. COMMON BRAND NAME(S): Paraplatin What should I tell my health care provider before I take this medicine? They need to know if you have any of these conditions:  blood disorders  hearing problems  kidney disease  recent or ongoing radiation therapy  an unusual or allergic reaction to carboplatin, cisplatin, other chemotherapy, other medicines, foods, dyes, or preservatives  pregnant or trying to get pregnant  breast-feeding How should I use this  medicine? This drug is usually given as an infusion into a vein. It is administered in a hospital or clinic by a specially trained health care professional. Talk to your pediatrician regarding the use of this medicine in children. Special care may be needed. Overdosage: If you think you have taken too much of this medicine contact a poison control center or emergency room at once. NOTE: This medicine is only for you. Do not share this medicine with others. What if I miss a dose? It is important not to miss a dose. Call your doctor or health care professional if you are unable to keep an appointment. What may interact with this medicine?  medicines for seizures  medicines to increase blood counts like filgrastim, pegfilgrastim, sargramostim  some antibiotics like amikacin, gentamicin, neomycin, streptomycin, tobramycin  vaccines Talk to your doctor or health care professional before taking any of these medicines:  acetaminophen  aspirin  ibuprofen  ketoprofen  naproxen This list may not describe all possible interactions. Give your health care provider a list of all the medicines, herbs, non-prescription drugs, or dietary supplements you use. Also tell them if you smoke, drink alcohol, or use illegal drugs. Some items may interact with your medicine. What should I watch for while using this medicine? Your condition will be monitored carefully while you are receiving this medicine. You will need important blood work done while you are taking this medicine. This drug may make you feel generally unwell. This is not uncommon, as chemotherapy can affect healthy cells as well as cancer cells. Report any side effects. Continue your course of treatment even though you feel ill unless your  doctor tells you to stop. In some cases, you may be given additional medicines to help with side effects. Follow all directions for their use. Call your doctor or health care professional for advice if you  get a fever, chills or sore throat, or other symptoms of a cold or flu. Do not treat yourself. This drug decreases your body's ability to fight infections. Try to avoid being around people who are sick. This medicine may increase your risk to bruise or bleed. Call your doctor or health care professional if you notice any unusual bleeding. Be careful brushing and flossing your teeth or using a toothpick because you may get an infection or bleed more easily. If you have any dental work done, tell your dentist you are receiving this medicine. Avoid taking products that contain aspirin, acetaminophen, ibuprofen, naproxen, or ketoprofen unless instructed by your doctor. These medicines may hide a fever. Do not become pregnant while taking this medicine. Women should inform their doctor if they wish to become pregnant or think they might be pregnant. There is a potential for serious side effects to an unborn child. Talk to your health care professional or pharmacist for more information. Do not breast-feed an infant while taking this medicine. What side effects may I notice from receiving this medicine? Side effects that you should report to your doctor or health care professional as soon as possible:  allergic reactions like skin rash, itching or hives, swelling of the face, lips, or tongue  signs of infection - fever or chills, cough, sore throat, pain or difficulty passing urine  signs of decreased platelets or bleeding - bruising, pinpoint red spots on the skin, black, tarry stools, nosebleeds  signs of decreased red blood cells - unusually weak or tired, fainting spells, lightheadedness  breathing problems  changes in hearing  changes in vision  chest pain  high blood pressure  low blood counts - This drug may decrease the number of white blood cells, red blood cells and platelets. You may be at increased risk for infections and bleeding.  nausea and vomiting  pain, swelling, redness or  irritation at the injection site  pain, tingling, numbness in the hands or feet  problems with balance, talking, walking  trouble passing urine or change in the amount of urine Side effects that usually do not require medical attention (report to your doctor or health care professional if they continue or are bothersome):  hair loss  loss of appetite  metallic taste in the mouth or changes in taste This list may not describe all possible side effects. Call your doctor for medical advice about side effects. You may report side effects to FDA at 1-800-FDA-1088. Where should I keep my medicine? This drug is given in a hospital or clinic and will not be stored at home. NOTE: This sheet is a summary. It may not cover all possible information. If you have questions about this medicine, talk to your doctor, pharmacist, or health care provider.  2021 Elsevier/Gold Standard (2007-12-13 14:38:05)

## 2020-10-16 ENCOUNTER — Telehealth: Payer: Self-pay | Admitting: *Deleted

## 2020-10-21 ENCOUNTER — Inpatient Hospital Stay: Payer: PPO

## 2020-10-21 ENCOUNTER — Inpatient Hospital Stay: Payer: PPO | Admitting: Oncology

## 2020-10-21 ENCOUNTER — Other Ambulatory Visit: Payer: Self-pay

## 2020-10-21 VITALS — BP 146/75 | HR 71 | Temp 98.1°F | Resp 71 | Ht 67.0 in | Wt 178.4 lb

## 2020-10-21 DIAGNOSIS — Z95828 Presence of other vascular implants and grafts: Secondary | ICD-10-CM | POA: Diagnosis not present

## 2020-10-21 DIAGNOSIS — C679 Malignant neoplasm of bladder, unspecified: Secondary | ICD-10-CM

## 2020-10-21 DIAGNOSIS — Z5111 Encounter for antineoplastic chemotherapy: Secondary | ICD-10-CM | POA: Diagnosis not present

## 2020-10-21 LAB — CMP (CANCER CENTER ONLY)
ALT: 36 U/L (ref 0–44)
AST: 45 U/L — ABNORMAL HIGH (ref 15–41)
Albumin: 2.7 g/dL — ABNORMAL LOW (ref 3.5–5.0)
Alkaline Phosphatase: 89 U/L (ref 38–126)
Anion gap: 9 (ref 5–15)
BUN: 34 mg/dL — ABNORMAL HIGH (ref 8–23)
CO2: 23 mmol/L (ref 22–32)
Calcium: 8.2 mg/dL — ABNORMAL LOW (ref 8.9–10.3)
Chloride: 102 mmol/L (ref 98–111)
Creatinine: 1.53 mg/dL — ABNORMAL HIGH (ref 0.61–1.24)
GFR, Estimated: 46 mL/min — ABNORMAL LOW (ref >60)
Glucose, Bld: 136 mg/dL — ABNORMAL HIGH (ref 70–99)
Potassium: 3.4 mmol/L — ABNORMAL LOW (ref 3.5–5.1)
Sodium: 134 mmol/L — ABNORMAL LOW (ref 135–145)
Total Bilirubin: 0.7 mg/dL (ref 0.3–1.2)
Total Protein: 6.6 g/dL (ref 6.5–8.1)

## 2020-10-21 LAB — CBC WITH DIFFERENTIAL (CANCER CENTER ONLY)
Abs Immature Granulocytes: 0.03 10*3/uL (ref 0.00–0.07)
Basophils Absolute: 0 10*3/uL (ref 0.0–0.1)
Basophils Relative: 0 %
Eosinophils Absolute: 0 10*3/uL (ref 0.0–0.5)
Eosinophils Relative: 1 %
HCT: 30.5 % — ABNORMAL LOW (ref 39.0–52.0)
Hemoglobin: 10.2 g/dL — ABNORMAL LOW (ref 13.0–17.0)
Immature Granulocytes: 1 %
Lymphocytes Relative: 22 %
Lymphs Abs: 1.3 10*3/uL (ref 0.7–4.0)
MCH: 30.3 pg (ref 26.0–34.0)
MCHC: 33.4 g/dL (ref 30.0–36.0)
MCV: 90.5 fL (ref 80.0–100.0)
Monocytes Absolute: 0.1 10*3/uL (ref 0.1–1.0)
Monocytes Relative: 1 %
Neutro Abs: 4.5 10*3/uL (ref 1.7–7.7)
Neutrophils Relative %: 75 %
Platelet Count: 110 10*3/uL — ABNORMAL LOW (ref 150–400)
RBC: 3.37 MIL/uL — ABNORMAL LOW (ref 4.22–5.81)
RDW: 13.9 % (ref 11.5–15.5)
WBC Count: 5.9 10*3/uL (ref 4.0–10.5)
nRBC: 0 % (ref 0.0–0.2)

## 2020-10-21 MED ORDER — HEPARIN SOD (PORK) LOCK FLUSH 100 UNIT/ML IV SOLN
500.0000 [IU] | Freq: Once | INTRAVENOUS | Status: AC
  Administered 2020-10-21: 500 [IU] via INTRAVENOUS
  Filled 2020-10-21: qty 5

## 2020-10-21 MED ORDER — SODIUM CHLORIDE 0.9% FLUSH
10.0000 mL | Freq: Once | INTRAVENOUS | Status: AC
  Administered 2020-10-21: 10 mL via INTRAVENOUS
  Filled 2020-10-21: qty 10

## 2020-10-21 NOTE — Progress Notes (Signed)
Hematology and Oncology Follow Up Visit  Blake Peterson [id number] November 02, 1940 80 y.o. 10/21/2020 10:16 AM Ria Bush, MDGutierrez, Garlon Hatchet, MD   Principle Diagnosis: 80 year old with bladder cancer diagnosed in December 2021.  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  Prior Therapy:  He is status post attempted radical cystectomy on August 30, 2020.  He subsequently underwent lymph node dissection and left ureteral lysis completed by Dr. Tresa Moore on August 30, 2020.  He was found to have perivesicular involvement of his tumor with metastatic disease into the left obturator and common iliac lymph nodes.  Current therapy:   Carboplatin and gemcitabine chemotherapy started on October 15, 2020. He Is here for evaluation prior to day 8 of cycle 1 of therapy.  Interim History: [name] presents today for repeat follow-up.  Since the last visit, he received the day 1 of cycle 1 of chemotherapy without any major complications.  He denies any nausea, vomiting or abdominal pain.  He denies any recent cough or fever.  He did have issue with constipation and now has loose bowel movements at times.  He denies any bone pain or pathological fractures.   Medications: Updated on review. Current Outpatient Medications  Medication Sig Dispense Refill  . atenolol (TENORMIN) 50 MG tablet TAKE 1 TABLET BY MOUTH EVERY DAY (Patient taking differently: Take 50 mg by mouth daily.) 90 tablet 0  . atorvastatin (LIPITOR) 80 MG tablet TAKE 1 TABLET BY MOUTH EVERYDAY AT BEDTIME (Patient taking differently: Take 80 mg by mouth daily.) 90 tablet 0  . Cholecalciferol (VITAMIN D-3 PO) Take 50 mcg by mouth daily.     . folic acid (FOLVITE) 1 MG tablet Take 1 mg by mouth daily.    Marland Kitchen HYDROcodone-acetaminophen (NORCO) 5-325 MG tablet Take 1-2 tablets by mouth every 6 (six) hours as needed for moderate pain. 20 tablet 0  . Omega-3 Fatty Acids  (FISH OIL PO) Take 300 mg by mouth daily.     . prochlorperazine (COMPAZINE) 10 MG tablet Take 1 tablet (10 mg total) by mouth every 6 (six) hours as needed for nausea or vomiting. 30 tablet 0  . vitamin B-12 (CYANOCOBALAMIN) 1000 MCG tablet Take 1,000 mcg by mouth daily.     No current facility-administered medications for this visit.   Facility-Administered Medications Ordered in Other Visits  Medication Dose Route Frequency Provider Last Rate Last Admin  . heparin lock flush 100 unit/mL  500 Units Intravenous Once Ameisha Mcclellan, Mathis Dad, MD      . sodium chloride flush (NS) 0.9 % injection 10 mL  10 mL Intravenous Once Wyatt Portela, MD         Allergies:  Allergies  Allergen Reactions  . Naprosyn [Naproxen] Swelling    Fingers and ankles swelling      Physical Exam:  Blood pressure (!) 146/75, pulse 71, temperature 98.1 F (36.7 C), temperature source Tympanic, resp. rate (!) 71, height 5\' 7"  (1.702 m), weight 178 lb 6.4 oz (80.9 kg), SpO2 100 %.     ECOG: 0    General appearance: Comfortable appearing without any discomfort Head: Normocephalic without any trauma Oropharynx: Mucous membranes are moist and pink without any thrush or ulcers. Eyes: Pupils are equal and round reactive to light. Lymph nodes: No cervical, supraclavicular, inguinal or axillary lymphadenopathy.   Heart:regular rate and rhythm.  S1 and S2 without leg edema. Lung: Clear without any rhonchi or wheezes.  No dullness  to percussion. Abdomin: Soft, nontender, nondistended with good bowel sounds.  No hepatosplenomegaly. Musculoskeletal: No joint deformity or effusion.  Full range of motion noted. Neurological: No deficits noted on motor, sensory and deep tendon reflex exam. Skin: No petechial rash or dryness.  Appeared moist.       Lab Results: Lab Results  Component Value Date   WBC 13.4 (H) 10/15/2020   HGB 11.5 (L) 10/15/2020   HCT 35.1 (L) 10/15/2020   MCV 91.9 10/15/2020   PLT 261  10/15/2020     Chemistry      Component Value Date/Time   NA 139 10/15/2020 0740   K 3.8 10/15/2020 0740   CL 109 10/15/2020 0740   CO2 19 (L) 10/15/2020 0740   BUN 44 (H) 10/15/2020 0740   CREATININE 2.39 (H) 10/15/2020 0740      Component Value Date/Time   CALCIUM 8.9 10/15/2020 0740   ALKPHOS 91 10/15/2020 0740   AST 36 10/15/2020 0740   ALT 42 10/15/2020 0740   BILITOT 0.4 10/15/2020 3281       80 year old with  1.    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  He is currently receiving palliative chemotherapy and received the first cycle of planned gemcitabine and carboplatin last week.  He is here for day 8 of the same cycle.  Risks and benefits of continuing this treatment were discussed at this time.  Potential complications include nausea, vomiting, myelosuppression, peripheral neuropathy and renal insufficiency.  Alternative treatment options such as Pembrolizumab or Padcev will be used as a salvage in the future.  He is agreeable to proceed at this time.   2. IV access:Port-A-Cath remains in place and currently in use for his chemotherapy.  3. Antiemetics: Compazine is available to him without any nausea or vomiting.  4. Renal failure: Related to hydronephrosis and baseline kidney function abnormalities.  We will continue to monitor on platinum therapy.  Last creatinine is returned to close to baseline.  5. Goals of care:His disease is incurable but aggressive measures are warranted given his reasonable performance status.  Any treatment is palliative at this time.  6.  Bilateral hydronephrosis: Nephrostomy tube placed on the left side on October 10, 2020.  His hydronephrosis is related to malignancy.  7. Follow-up:  He will return on February 1 to complete day 8 of cycle 1.  In 2 weeks for the start of cycle 3 of therapy.  30  minutes were spent on this encounter.  Time was dedicated to reviewing his disease  status, reviewing laboratory data and future plan of care discussion.     Zola Button, MD 1/31/202210:16 AM

## 2020-10-22 ENCOUNTER — Inpatient Hospital Stay: Payer: PPO | Attending: Oncology

## 2020-10-22 ENCOUNTER — Inpatient Hospital Stay: Payer: PPO

## 2020-10-22 ENCOUNTER — Other Ambulatory Visit: Payer: PPO

## 2020-10-22 ENCOUNTER — Other Ambulatory Visit: Payer: Self-pay

## 2020-10-22 VITALS — BP 123/67 | HR 91 | Temp 97.7°F | Wt 176.0 lb

## 2020-10-22 DIAGNOSIS — Z5111 Encounter for antineoplastic chemotherapy: Secondary | ICD-10-CM | POA: Insufficient documentation

## 2020-10-22 DIAGNOSIS — C679 Malignant neoplasm of bladder, unspecified: Secondary | ICD-10-CM | POA: Diagnosis not present

## 2020-10-22 MED ORDER — PROCHLORPERAZINE MALEATE 10 MG PO TABS
10.0000 mg | ORAL_TABLET | Freq: Once | ORAL | Status: AC
  Administered 2020-10-22: 10 mg via ORAL

## 2020-10-22 MED ORDER — HEPARIN SOD (PORK) LOCK FLUSH 100 UNIT/ML IV SOLN
500.0000 [IU] | Freq: Once | INTRAVENOUS | Status: AC | PRN
  Administered 2020-10-22: 500 [IU]
  Filled 2020-10-22: qty 5

## 2020-10-22 MED ORDER — SODIUM CHLORIDE 0.9 % IV SOLN
Freq: Once | INTRAVENOUS | Status: AC
  Filled 2020-10-22: qty 250

## 2020-10-22 MED ORDER — PROCHLORPERAZINE MALEATE 10 MG PO TABS
ORAL_TABLET | ORAL | Status: AC
  Filled 2020-10-22: qty 1

## 2020-10-22 MED ORDER — SODIUM CHLORIDE 0.9 % IV SOLN
1000.0000 mg/m2 | Freq: Once | INTRAVENOUS | Status: AC
  Administered 2020-10-22: 2014 mg via INTRAVENOUS
  Filled 2020-10-22: qty 52.97

## 2020-10-22 MED ORDER — SODIUM CHLORIDE 0.9% FLUSH
10.0000 mL | INTRAVENOUS | Status: DC | PRN
  Administered 2020-10-22: 10 mL
  Filled 2020-10-22: qty 10

## 2020-10-22 NOTE — Patient Instructions (Signed)
[facility] °Discharge Instructions for Patients Receiving Chemotherapy ° °Today you received the following chemotherapy agents Gemzar ° °To help prevent nausea and vomiting after your treatment, we encourage you to take your nausea medication as directed. °  °If you develop nausea and vomiting that is not controlled by your nausea medication, call the clinic.  ° °BELOW ARE SYMPTOMS THAT SHOULD BE REPORTED IMMEDIATELY: °· *FEVER GREATER THAN 100.5 F °· *CHILLS WITH OR WITHOUT FEVER °· NAUSEA AND VOMITING THAT IS NOT CONTROLLED WITH YOUR NAUSEA MEDICATION °· *UNUSUAL SHORTNESS OF BREATH °· *UNUSUAL BRUISING OR BLEEDING °· TENDERNESS IN MOUTH AND THROAT WITH OR WITHOUT PRESENCE OF ULCERS °· *URINARY PROBLEMS °· *BOWEL PROBLEMS °· UNUSUAL RASH °Items with * indicate a potential emergency and should be followed up as soon as possible. ° °Feel free to call the clinic should you have any questions or concerns. The clinic phone number is [phone]. ° °Please show the CHEMO ALERT CARD at check-in to the Emergency Department and triage nurse. ° ° °

## 2020-10-22 NOTE — Progress Notes (Signed)
Per Dr. Alen Blew it is ok to treat today with creatinine 1.53

## 2020-10-22 NOTE — Progress Notes (Signed)
Nutrition Assessment   Reason for Assessment:  Patient identified on Malnutrition Screening report for weight loss   ASSESSMENT:  80 year old male with bladder cancer.  Past medical history of HTN, HLD.  Noted recent hospital admission for acute renal failure and bilateral nephrostomy tubes placed on 1/20.    Inpatient RD notes reviewed.   Met with patient during infusion.  Patient reports that he had issues with constipation and took stool softners, etc to relieve constipation and has started with diarrhea.  Reports unable to make it to the bathroom, worse yesterday but better today.  Reports that he discussed with MD yesterday and he did not want patient to take anything.  Patient interested in foods to eat to help with diarrhea   Medications: folic acid, vit D, fish oil, vit b 12   Labs: Na 134, K 3.4, glucose 136, BUN 34, creatinine 1.53   Anthropometrics:   Height: 67 inches Weight: 178 lb 6.4 oz on 1/31 UBW: 185-190 lb last 6 months BMI: 27 6% weight loss in the last 6 months  Estimated Energy Needs  Kcals: 2025-2400 Protein: 101-120 g Fluid: 2 L   NUTRITION DIAGNOSIS: Inadequate oral intake related to cancer and cancer related treatment side effects, recent hospital admission as evidenced by 6% weight loss in the last 6 months.     INTERVENTION:  Discussed strategies to help with diarrhea/loose stools.  Handout provided.   Encouraged patient to reach back out to MD if does not resolve in few days.   Patient can try oral nutrition supplements as long as it does not make diarrhea worse.  Patient will monitor symptoms.   Discussed importance of fluids.  Contact information provided   MONITORING, EVALUATION, GOAL: weight trends, intake   Next Visit: Feb 15 during infusion  Guy Seese B. Zenia Resides, [place], [place] Registered Dietitian [phone] (mobile)

## 2020-10-25 ENCOUNTER — Ambulatory Visit (INDEPENDENT_AMBULATORY_CARE_PROVIDER_SITE_OTHER): Payer: PPO | Admitting: Family Medicine

## 2020-10-25 ENCOUNTER — Encounter: Payer: Self-pay | Admitting: Family Medicine

## 2020-10-25 ENCOUNTER — Other Ambulatory Visit: Payer: Self-pay

## 2020-10-25 VITALS — BP 136/70 | HR 72 | Temp 97.7°F | Ht 67.0 in | Wt 177.6 lb

## 2020-10-25 DIAGNOSIS — E1169 Type 2 diabetes mellitus with other specified complication: Secondary | ICD-10-CM | POA: Diagnosis not present

## 2020-10-25 DIAGNOSIS — I700 Atherosclerosis of aorta: Secondary | ICD-10-CM | POA: Diagnosis not present

## 2020-10-25 DIAGNOSIS — M059 Rheumatoid arthritis with rheumatoid factor, unspecified: Secondary | ICD-10-CM

## 2020-10-25 DIAGNOSIS — I10 Essential (primary) hypertension: Secondary | ICD-10-CM

## 2020-10-25 DIAGNOSIS — N139 Obstructive and reflux uropathy, unspecified: Secondary | ICD-10-CM

## 2020-10-25 DIAGNOSIS — E785 Hyperlipidemia, unspecified: Secondary | ICD-10-CM | POA: Diagnosis not present

## 2020-10-25 DIAGNOSIS — E118 Type 2 diabetes mellitus with unspecified complications: Secondary | ICD-10-CM | POA: Diagnosis not present

## 2020-10-25 DIAGNOSIS — C679 Malignant neoplasm of bladder, unspecified: Secondary | ICD-10-CM | POA: Diagnosis not present

## 2020-10-25 LAB — POCT GLYCOSYLATED HEMOGLOBIN (HGB A1C): Hemoglobin A1C: 6.6 % — AB (ref 4.0–5.6)

## 2020-10-25 NOTE — Assessment & Plan Note (Signed)
Now with bilateral nephrostomy tubes in place, seems to be doing well with improved creatinine.

## 2020-10-25 NOTE — Assessment & Plan Note (Addendum)
H/o this, saw rheum on MTX. Now off this.  Metastatic bladder cancer treatment with palliative chemo taking precedence.

## 2020-10-25 NOTE — Assessment & Plan Note (Signed)
Unfortunately bladder surgery 08/2020 revealed aggressive stage IV urothelial cancer with metastasis. Now receiving palliative chemotherapy (started last month).  Appreciate uro and oncology care.  Spirits remain high.

## 2020-10-25 NOTE — Assessment & Plan Note (Signed)
Chronic, stable only on atenolol. Will remain off enalapril.

## 2020-10-25 NOTE — Assessment & Plan Note (Addendum)
H/o diabetes, anticipate better controlled with weight loss due to cancer. Will drop atorvastatin dose to 40mg  daily. No personal hx CAD, MI, CVA. Ok to stop fish oil at this time.  The 10-year ASCVD risk score Mikey Bussing DC Brooke Bonito., et al., 2013) is: 57.4%   Values used to calculate the score:     Age: 80 years     Sex: Male     Is Non-Hispanic African American: No     Diabetic: Yes     Tobacco smoker: No     Systolic Blood Pressure: 675 mmHg     Is BP treated: Yes     HDL Cholesterol: 38.4 mg/dL     Total Cholesterol: 131 mg/dL

## 2020-10-25 NOTE — Assessment & Plan Note (Addendum)
Chronic, anticipate improved levels due to recent weight loss. Update A1c today. Has been diet controlled up to now.

## 2020-10-25 NOTE — Assessment & Plan Note (Signed)
Continue statin. 

## 2020-10-25 NOTE — Patient Instructions (Addendum)
Stop fish oil.  Ensure good protein intake to keep strength up.  Try Gas-x (simethicone).  Drop atorvastatin dose to 40mg  daily. Cut current dose in half. We will check cholesterol next time we see you.  Good to see you today.

## 2020-10-25 NOTE — Progress Notes (Signed)
Patient ID: Blake Peterson, male    DOB: Mar 09, 1941, 80 y.o.   MRN: [id number]  This visit was conducted in person.  BP 136/70 (BP Location: Left Arm, Patient Position: Sitting, Cuff Size: Normal)   Pulse 72   Temp 97.7 F (36.5 C) (Temporal)   Ht 5\' 7"  (1.702 m)   Wt 177 lb 9 oz (80.5 kg)   SpO2 98%   BMI 27.81 kg/m    CC: hosp f/u visit  Subjective:   HPI: Blake Peterson is a 80 y.o. male presenting on 10/25/2020 for Hospitalization Follow-up (Admitted on 10/09/20 to [facility]. 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 He had attempted radical cystectomy (not done) and LN dissection with L ureteral lysis (Blake Peterson) 08/2020. He had perivesicular involvement of tumor with metastatic disease to L obturator and common iliac LN.   Recent hospitalization for acute renal failure due to obstruction with resultant bilat hydronephrosis s/p bilateral nephrostomy tube.   Currently on carboplatin and gemcitabine palliative chemo q2 wks through port-a-cath started 10/15/2020 [facility]). Takes compazine PRN for nausea.   Currently on liquid diet through dietician due to difficulty tolerating certain foods. Tolerates dry toast, apple sauce, grilled chicken, more trouble with biscuits. He is drinking 1 ensure a day for protein.   Recent hemorrhoidal bleed, has since improved.  Overall doing well at home. Keeping spirits up as able. Supportive family. Able to do things he wants to do at this time. Denies depressed mood.    Admit date: 10/09/2020 Discharge date: 10/12/2020  Admitted From: Home Disposition: Home  Discharge Diagnoses:  Principal Problem:   Acute renal failure ([place]) Active Problems:   HTN (hypertension)   Bladder cancer ([place])   Acute bilateral obstructive uropathy  Recommendations for Outpatient Follow-up:  1. Follow up with PCP in 1 week 2. Follow up with urology and medical  oncology 3. Please obtain BMP/CBC in 2 days 4. Please follow up on the following pending results: None  Home Health: TOC Equipment/Devices: None  Discharge Condition: Stable CODE STATUS: Full code Diet recommendation: Heart healthy      Relevant past medical, surgical, family and social history reviewed and updated as indicated. Interim medical history since our last visit reviewed. Allergies and medications reviewed and updated. Outpatient Medications Prior to Visit  Medication Sig Dispense Refill  . atenolol (TENORMIN) 50 MG tablet TAKE 1 TABLET BY MOUTH EVERY DAY (Patient taking differently: Take 50 mg by mouth daily.) 90 tablet 0  . Cholecalciferol (VITAMIN D-3 PO) Take 50 mcg by mouth daily.     . folic acid (FOLVITE) 1 MG tablet Take 1 mg by mouth daily.    . prochlorperazine (COMPAZINE) 10 MG tablet Take 1 tablet (10 mg total) by mouth every 6 (six) hours as needed for nausea or vomiting. 30 tablet 0  . vitamin B-12 (CYANOCOBALAMIN) 1000 MCG tablet Take 1,000 mcg by mouth daily.    Marland Kitchen atorvastatin (LIPITOR) 80 MG tablet TAKE 1 TABLET BY MOUTH EVERYDAY AT BEDTIME (Patient taking differently: Take 80 mg by mouth daily.) 90 tablet 0  . Omega-3 Fatty Acids (FISH OIL PO) Take 300 mg by mouth daily.     Marland Kitchen atorvastatin (LIPITOR) 80 MG tablet Take 0.5 tablets (40 mg total) by mouth daily.    Marland Kitchen HYDROcodone-acetaminophen (NORCO) 5-325 MG tablet Take 1-2 tablets by mouth every 6 (six) hours as needed for moderate pain. [place]  tablet 0   No facility-administered medications prior to visit.     Per HPI unless specifically indicated in ROS section below Review of Systems Objective:  BP 136/70 (BP Location: Left Arm, Patient Position: Sitting, Cuff Size: Normal)   Pulse 72   Temp 97.7 F (36.5 C) (Temporal)   Ht 5\' 7"  (1.702 m)   Wt 177 lb 9 oz (80.5 kg)   SpO2 98%   BMI 27.81 kg/m   Wt Readings from Last 3 Encounters:  10/25/20 177 lb 9 oz (80.5 kg)  10/22/20 176 lb (79.8 kg)   10/21/20 178 lb 6.4 oz (80.9 kg)      Physical Exam Vitals and nursing note reviewed.  Constitutional:      Appearance: Normal appearance. He is not ill-appearing.  Cardiovascular:     Rate and Rhythm: Normal rate and regular rhythm.     Pulses: Normal pulses.     Heart sounds: Normal heart sounds. No murmur heard.   Pulmonary:     Effort: Pulmonary effort is normal. No respiratory distress.     Breath sounds: Normal breath sounds. No wheezing, rhonchi or rales.  Abdominal:     General: Bowel sounds are normal. There is no distension.     Palpations: Abdomen is soft. There is no mass.     Tenderness: There is no abdominal tenderness. There is no guarding or rebound.     Hernia: No hernia is present.  Musculoskeletal:     Right lower leg: No edema.     Left lower leg: No edema.     Comments:  Nephrostomy bags present BLE Nephrostomy tubes with dressing c/d/i  Skin:    General: Skin is warm and dry.     Findings: No rash.  Neurological:     Mental Status: He is alert.  Psychiatric:        Mood and Affect: Mood normal.        Behavior: Behavior normal.       Results for orders placed or performed in visit on 10/25/20  POCT glycosylated hemoglobin (Hb A1C)  Result Value Ref Range   Hemoglobin A1C 6.6 (A) 4.0 - 5.6 %   HbA1c POC (<> result, manual entry)     HbA1c, POC (prediabetic range)     HbA1c, POC (controlled diabetic range)     Lab Results  Component Value Date   CREATININE 1.53 (H) 10/21/2020   BUN 34 (H) 10/21/2020   NA 134 (L) 10/21/2020   K 3.4 (L) 10/21/2020   CL 102 10/21/2020   CO2 23 10/21/2020    Lab Results  Component Value Date   WBC 5.9 10/21/2020   HGB 10.2 (L) 10/21/2020   HCT 30.5 (L) 10/21/2020   MCV 90.5 10/21/2020   PLT 110 (L) 10/21/2020    Assessment & Plan:  This visit occurred during the SARS-CoV-2 public health emergency.  Safety protocols were in place, including screening questions prior to the visit, additional usage of  staff PPE, and extensive cleaning of exam room while observing appropriate contact time as indicated for disinfecting solutions.   Problem List Items Addressed This Visit    Thoracic aorta atherosclerosis ([place])    Continue statin.       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 Now receiving palliative chemotherapy (started last month).  Appreciate  uro and oncology care.  Spirits remain high.       Seropositive rheumatoid arthritis ([place])    H/o this, saw rheum on MTX. Now off this.  Metastatic bladder cancer treatment with palliative chemo taking precedence.       Hyperlipidemia associated with type 2 diabetes mellitus ([place])    H/o diabetes, anticipate better controlled with weight loss due to cancer. Will drop atorvastatin dose to 40mg  daily. No personal hx CAD, MI, CVA. Ok to stop fish oil at this time.  The 10-year ASCVD risk score Mikey Bussing DC Brooke Bonito., et al., 2013) is: 57.4%   Values used to calculate the score:     Age: 62 years     Sex: Male     Is Non-Hispanic African American: No     Diabetic: Yes     Tobacco smoker: No     Systolic Blood Pressure: 619 mmHg     Is BP treated: Yes     HDL Cholesterol: 38.4 mg/dL     Total Cholesterol: 131 mg/dL       Relevant Medications   atorvastatin (LIPITOR) 80 MG tablet   HTN (hypertension)    Chronic, stable only on atenolol. Will remain off enalapril.       Relevant Medications   atorvastatin (LIPITOR) 80 MG tablet   Controlled diabetes mellitus type 2 with complications (H[facility])    Chronic, anticipate improved levels due to recent weight loss. Update A1c today. Has been diet controlled up to now.       Relevant Medications   atorvastatin (LIPITOR) 80 MG tablet   Other Relevant Orders   POCT glycosylated hemoglobin (Hb A1C) (Completed)   Acute  bilateral obstructive uropathy    Now with bilateral nephrostomy tubes in place, seems to be doing well with improved creatinine.           No orders of the defined types were placed in this encounter.  Orders Placed This Encounter  Procedures  . POCT glycosylated hemoglobin (Hb A1C)    Patient Instructions  Stop fish oil.  Ensure good protein intake to keep strength up.  Try Gas-x (simethicone).  Drop atorvastatin dose to 40mg  daily. Cut current dose in half. We will check cholesterol next time we see you.  Good to see you today.   Follow up plan: Return in about 6 months (around 04/24/2021), or if symptoms worsen or fail to improve, for follow up visit.  Ria Bush, MD

## 2020-10-29 ENCOUNTER — Ambulatory Visit: Payer: PPO

## 2020-10-29 ENCOUNTER — Other Ambulatory Visit: Payer: PPO

## 2020-10-31 ENCOUNTER — Other Ambulatory Visit: Payer: Self-pay | Admitting: Oncology

## 2020-11-04 MED FILL — Dexamethasone Sodium Phosphate Inj 100 MG/10ML: INTRAMUSCULAR | Qty: 1 | Status: AC

## 2020-11-04 MED FILL — Fosaprepitant Dimeglumine For IV Infusion 150 MG (Base Eq): INTRAVENOUS | Qty: 5 | Status: AC

## 2020-11-05 ENCOUNTER — Inpatient Hospital Stay: Payer: PPO

## 2020-11-05 ENCOUNTER — Other Ambulatory Visit: Payer: Self-pay

## 2020-11-05 ENCOUNTER — Inpatient Hospital Stay (HOSPITAL_COMMUNITY): Payer: PPO

## 2020-11-05 ENCOUNTER — Encounter (HOSPITAL_COMMUNITY): Payer: Self-pay | Admitting: Emergency Medicine

## 2020-11-05 ENCOUNTER — Inpatient Hospital Stay (HOSPITAL_COMMUNITY): Admission: EM | Disposition: E | Payer: Self-pay | Source: Home / Self Care | Attending: Cardiovascular Disease

## 2020-11-05 ENCOUNTER — Emergency Department (HOSPITAL_COMMUNITY): Payer: PPO

## 2020-11-05 ENCOUNTER — Inpatient Hospital Stay: Payer: PPO | Admitting: Oncology

## 2020-11-05 DIAGNOSIS — E872 Acidosis: Secondary | ICD-10-CM | POA: Diagnosis not present

## 2020-11-05 DIAGNOSIS — E1151 Type 2 diabetes mellitus with diabetic peripheral angiopathy without gangrene: Secondary | ICD-10-CM | POA: Diagnosis not present

## 2020-11-05 DIAGNOSIS — H35033 Hypertensive retinopathy, bilateral: Secondary | ICD-10-CM | POA: Diagnosis present

## 2020-11-05 DIAGNOSIS — I2111 ST elevation (STEMI) myocardial infarction involving right coronary artery: Secondary | ICD-10-CM

## 2020-11-05 DIAGNOSIS — I70223 Atherosclerosis of native arteries of extremities with rest pain, bilateral legs: Secondary | ICD-10-CM

## 2020-11-05 DIAGNOSIS — E785 Hyperlipidemia, unspecified: Secondary | ICD-10-CM | POA: Diagnosis not present

## 2020-11-05 DIAGNOSIS — Z8371 Family history of colonic polyps: Secondary | ICD-10-CM

## 2020-11-05 DIAGNOSIS — R0989 Other specified symptoms and signs involving the circulatory and respiratory systems: Secondary | ICD-10-CM | POA: Diagnosis not present

## 2020-11-05 DIAGNOSIS — R531 Weakness: Secondary | ICD-10-CM | POA: Diagnosis present

## 2020-11-05 DIAGNOSIS — M069 Rheumatoid arthritis, unspecified: Secondary | ICD-10-CM | POA: Diagnosis not present

## 2020-11-05 DIAGNOSIS — Z8719 Personal history of other diseases of the digestive system: Secondary | ICD-10-CM | POA: Diagnosis not present

## 2020-11-05 DIAGNOSIS — Y9301 Activity, walking, marching and hiking: Secondary | ICD-10-CM | POA: Diagnosis present

## 2020-11-05 DIAGNOSIS — R7989 Other specified abnormal findings of blood chemistry: Secondary | ICD-10-CM | POA: Diagnosis present

## 2020-11-05 DIAGNOSIS — Z20822 Contact with and (suspected) exposure to covid-19: Secondary | ICD-10-CM | POA: Diagnosis present

## 2020-11-05 DIAGNOSIS — Z85828 Personal history of other malignant neoplasm of skin: Secondary | ICD-10-CM

## 2020-11-05 DIAGNOSIS — I4891 Unspecified atrial fibrillation: Secondary | ICD-10-CM | POA: Diagnosis present

## 2020-11-05 DIAGNOSIS — Z8249 Family history of ischemic heart disease and other diseases of the circulatory system: Secondary | ICD-10-CM | POA: Diagnosis not present

## 2020-11-05 DIAGNOSIS — Z79899 Other long term (current) drug therapy: Secondary | ICD-10-CM

## 2020-11-05 DIAGNOSIS — R578 Other shock: Secondary | ICD-10-CM | POA: Diagnosis not present

## 2020-11-05 DIAGNOSIS — R230 Cyanosis: Secondary | ICD-10-CM | POA: Diagnosis present

## 2020-11-05 DIAGNOSIS — Z87891 Personal history of nicotine dependence: Secondary | ICD-10-CM

## 2020-11-05 DIAGNOSIS — Z515 Encounter for palliative care: Secondary | ICD-10-CM

## 2020-11-05 DIAGNOSIS — I2510 Atherosclerotic heart disease of native coronary artery without angina pectoris: Secondary | ICD-10-CM | POA: Diagnosis not present

## 2020-11-05 DIAGNOSIS — I252 Old myocardial infarction: Secondary | ICD-10-CM | POA: Diagnosis not present

## 2020-11-05 DIAGNOSIS — I10 Essential (primary) hypertension: Secondary | ICD-10-CM | POA: Diagnosis not present

## 2020-11-05 DIAGNOSIS — N179 Acute kidney failure, unspecified: Secondary | ICD-10-CM | POA: Diagnosis present

## 2020-11-05 DIAGNOSIS — Y92009 Unspecified place in unspecified non-institutional (private) residence as the place of occurrence of the external cause: Secondary | ICD-10-CM | POA: Diagnosis not present

## 2020-11-05 DIAGNOSIS — C679 Malignant neoplasm of bladder, unspecified: Secondary | ICD-10-CM | POA: Diagnosis not present

## 2020-11-05 DIAGNOSIS — I70229 Atherosclerosis of native arteries of extremities with rest pain, unspecified extremity: Secondary | ICD-10-CM | POA: Diagnosis not present

## 2020-11-05 DIAGNOSIS — I213 ST elevation (STEMI) myocardial infarction of unspecified site: Secondary | ICD-10-CM

## 2020-11-05 DIAGNOSIS — Z800 Family history of malignant neoplasm of digestive organs: Secondary | ICD-10-CM | POA: Diagnosis not present

## 2020-11-05 DIAGNOSIS — I219 Acute myocardial infarction, unspecified: Secondary | ICD-10-CM | POA: Diagnosis not present

## 2020-11-05 DIAGNOSIS — Z886 Allergy status to analgesic agent status: Secondary | ICD-10-CM | POA: Diagnosis not present

## 2020-11-05 DIAGNOSIS — I959 Hypotension, unspecified: Secondary | ICD-10-CM | POA: Diagnosis present

## 2020-11-05 DIAGNOSIS — Z936 Other artificial openings of urinary tract status: Secondary | ICD-10-CM | POA: Diagnosis not present

## 2020-11-05 DIAGNOSIS — E669 Obesity, unspecified: Secondary | ICD-10-CM | POA: Diagnosis not present

## 2020-11-05 DIAGNOSIS — W19XXXA Unspecified fall, initial encounter: Secondary | ICD-10-CM | POA: Diagnosis present

## 2020-11-05 DIAGNOSIS — R570 Cardiogenic shock: Secondary | ICD-10-CM | POA: Diagnosis present

## 2020-11-05 DIAGNOSIS — Z974 Presence of external hearing-aid: Secondary | ICD-10-CM

## 2020-11-05 DIAGNOSIS — I491 Atrial premature depolarization: Secondary | ICD-10-CM | POA: Diagnosis not present

## 2020-11-05 DIAGNOSIS — I2119 ST elevation (STEMI) myocardial infarction involving other coronary artery of inferior wall: Secondary | ICD-10-CM | POA: Diagnosis not present

## 2020-11-05 DIAGNOSIS — I998 Other disorder of circulatory system: Secondary | ICD-10-CM | POA: Diagnosis not present

## 2020-11-05 DIAGNOSIS — K7200 Acute and subacute hepatic failure without coma: Secondary | ICD-10-CM | POA: Diagnosis present

## 2020-11-05 DIAGNOSIS — I358 Other nonrheumatic aortic valve disorders: Secondary | ICD-10-CM | POA: Diagnosis present

## 2020-11-05 DIAGNOSIS — I499 Cardiac arrhythmia, unspecified: Secondary | ICD-10-CM | POA: Diagnosis not present

## 2020-11-05 DIAGNOSIS — R42 Dizziness and giddiness: Secondary | ICD-10-CM | POA: Diagnosis not present

## 2020-11-05 DIAGNOSIS — I2781 Cor pulmonale (chronic): Secondary | ICD-10-CM | POA: Diagnosis present

## 2020-11-05 DIAGNOSIS — R0902 Hypoxemia: Secondary | ICD-10-CM | POA: Diagnosis not present

## 2020-11-05 HISTORY — PX: LEFT HEART CATH AND CORONARY ANGIOGRAPHY: CATH118249

## 2020-11-05 HISTORY — PX: CORONARY/GRAFT ACUTE MI REVASCULARIZATION: CATH118305

## 2020-11-05 LAB — COOXEMETRY PANEL
Carboxyhemoglobin: 0.4 % — ABNORMAL LOW (ref 0.5–1.5)
Methemoglobin: 1 % (ref 0.0–1.5)
O2 Saturation: 41.5 %
Total hemoglobin: 8.1 g/dL — ABNORMAL LOW (ref 12.0–16.0)

## 2020-11-05 LAB — CBC WITH DIFFERENTIAL/PLATELET
Abs Immature Granulocytes: 20.68 10*3/uL — ABNORMAL HIGH (ref 0.00–0.07)
Basophils Absolute: 0.1 10*3/uL (ref 0.0–0.1)
Basophils Relative: 0 %
Eosinophils Absolute: 0 10*3/uL (ref 0.0–0.5)
Eosinophils Relative: 0 %
HCT: 26.7 % — ABNORMAL LOW (ref 39.0–52.0)
Hemoglobin: 8.8 g/dL — ABNORMAL LOW (ref 13.0–17.0)
Immature Granulocytes: 39 %
Lymphocytes Relative: 10 %
Lymphs Abs: 5.1 10*3/uL — ABNORMAL HIGH (ref 0.7–4.0)
MCH: 31.3 pg (ref 26.0–34.0)
MCHC: 33 g/dL (ref 30.0–36.0)
MCV: 95 fL (ref 80.0–100.0)
Monocytes Absolute: 6.3 10*3/uL — ABNORMAL HIGH (ref 0.1–1.0)
Monocytes Relative: 12 %
Neutro Abs: 21.4 10*3/uL — ABNORMAL HIGH (ref 1.7–7.7)
Neutrophils Relative %: 39 %
Platelets: 473 10*3/uL — ABNORMAL HIGH (ref 150–400)
RBC: 2.81 MIL/uL — ABNORMAL LOW (ref 4.22–5.81)
RDW: 14.6 % (ref 11.5–15.5)
WBC: 53.6 10*3/uL (ref 4.0–10.5)
nRBC: 4.9 % — ABNORMAL HIGH (ref 0.0–0.2)

## 2020-11-05 LAB — CBC
HCT: 25.5 % — ABNORMAL LOW (ref 39.0–52.0)
Hemoglobin: 8.2 g/dL — ABNORMAL LOW (ref 13.0–17.0)
MCH: 30.3 pg (ref 26.0–34.0)
MCHC: 32.2 g/dL (ref 30.0–36.0)
MCV: 94.1 fL (ref 80.0–100.0)
Platelets: 506 10*3/uL — ABNORMAL HIGH (ref 150–400)
RBC: 2.71 MIL/uL — ABNORMAL LOW (ref 4.22–5.81)
RDW: 14.6 % (ref 11.5–15.5)
WBC: 70 10*3/uL (ref 4.0–10.5)
nRBC: 4 % — ABNORMAL HIGH (ref 0.0–0.2)

## 2020-11-05 LAB — COMPREHENSIVE METABOLIC PANEL
ALT: 1881 U/L — ABNORMAL HIGH (ref 0–44)
AST: 3604 U/L — ABNORMAL HIGH (ref 15–41)
Albumin: 2 g/dL — ABNORMAL LOW (ref 3.5–5.0)
Alkaline Phosphatase: 206 U/L — ABNORMAL HIGH (ref 38–126)
Anion gap: 26 — ABNORMAL HIGH (ref 5–15)
BUN: 41 mg/dL — ABNORMAL HIGH (ref 8–23)
CO2: 8 mmol/L — ABNORMAL LOW (ref 22–32)
Calcium: 8.2 mg/dL — ABNORMAL LOW (ref 8.9–10.3)
Chloride: 96 mmol/L — ABNORMAL LOW (ref 98–111)
Creatinine, Ser: 3.28 mg/dL — ABNORMAL HIGH (ref 0.61–1.24)
GFR, Estimated: 19 mL/min — ABNORMAL LOW (ref >60)
Glucose, Bld: 227 mg/dL — ABNORMAL HIGH (ref 70–99)
Potassium: 4.3 mmol/L (ref 3.5–5.1)
Sodium: 130 mmol/L — ABNORMAL LOW (ref 135–145)
Total Bilirubin: 0.7 mg/dL (ref 0.3–1.2)
Total Protein: 6 g/dL — ABNORMAL LOW (ref 6.5–8.1)

## 2020-11-05 LAB — RESP PANEL BY RT-PCR (FLU A&B, COVID) ARPGX2
Influenza A by PCR: NEGATIVE
Influenza B by PCR: NEGATIVE
SARS Coronavirus 2 by RT PCR: NEGATIVE

## 2020-11-05 LAB — POCT I-STAT, CHEM 8
BUN: 40 mg/dL — ABNORMAL HIGH (ref 8–23)
Calcium, Ion: 1.04 mmol/L — ABNORMAL LOW (ref 1.15–1.40)
Chloride: 98 mmol/L (ref 98–111)
Creatinine, Ser: 3.1 mg/dL — ABNORMAL HIGH (ref 0.61–1.24)
Glucose, Bld: 187 mg/dL — ABNORMAL HIGH (ref 70–99)
HCT: 24 % — ABNORMAL LOW (ref 39.0–52.0)
Hemoglobin: 8.2 g/dL — ABNORMAL LOW (ref 13.0–17.0)
Potassium: 4 mmol/L (ref 3.5–5.1)
Sodium: 128 mmol/L — ABNORMAL LOW (ref 135–145)
TCO2: 10 mmol/L — ABNORMAL LOW (ref 22–32)

## 2020-11-05 LAB — POCT ACTIVATED CLOTTING TIME
Activated Clotting Time: 464 seconds
Activated Clotting Time: 225 seconds

## 2020-11-05 LAB — PROTIME-INR
INR: 1.9 — ABNORMAL HIGH (ref 0.8–1.2)
INR: 3.1 — ABNORMAL HIGH (ref 0.8–1.2)
Prothrombin Time: 21.5 seconds — ABNORMAL HIGH (ref 11.4–15.2)
Prothrombin Time: 31.3 seconds — ABNORMAL HIGH (ref 11.4–15.2)

## 2020-11-05 LAB — ECHOCARDIOGRAM COMPLETE
Area-P 1/2: 2.95 cm2
Height: 70 in
S' Lateral: 1.9 cm
Weight: 2832 oz

## 2020-11-05 LAB — TROPONIN I (HIGH SENSITIVITY)
Troponin I (High Sensitivity): >27000 ng/L (ref <18)
Troponin I (High Sensitivity): >27000 ng/L (ref <18)

## 2020-11-05 LAB — MRSA PCR SCREENING: MRSA by PCR: NEGATIVE

## 2020-11-05 LAB — APTT: aPTT: 40 seconds — ABNORMAL HIGH (ref 24–36)

## 2020-11-05 LAB — LACTIC ACID, PLASMA
Lactic Acid, Venous: >11 mmol/L (ref 0.5–1.9)
Lactic Acid, Venous: >11 mmol/L (ref 0.5–1.9)

## 2020-11-05 LAB — CREATININE, SERUM
Creatinine, Ser: 3.26 mg/dL — ABNORMAL HIGH (ref 0.61–1.24)
GFR, Estimated: 19 mL/min — ABNORMAL LOW (ref >60)

## 2020-11-05 SURGERY — CORONARY/GRAFT ACUTE MI REVASCULARIZATION
Anesthesia: LOCAL

## 2020-11-05 MED ORDER — BIVALIRUDIN BOLUS VIA INFUSION - CUPID
INTRAVENOUS | Status: DC | PRN
  Administered 2020-11-05: 60.225 mg via INTRAVENOUS

## 2020-11-05 MED ORDER — NOREPINEPHRINE 4 MG/250ML-% IV SOLN
0.0000 ug/min | INTRAVENOUS | Status: DC
  Administered 2020-11-05: 7 ug/min via INTRAVENOUS
  Filled 2020-11-05: qty 250

## 2020-11-05 MED ORDER — SODIUM CHLORIDE 0.9 % IV SOLN: INTRAVENOUS | Status: AC

## 2020-11-05 MED ORDER — SODIUM CHLORIDE 0.9% FLUSH: 3.0000 mL | INTRAVENOUS | Status: DC | PRN

## 2020-11-05 MED ORDER — TICAGRELOR 90 MG PO TABS: ORAL_TABLET | ORAL | Status: DC | PRN

## 2020-11-05 MED ORDER — PROCHLORPERAZINE MALEATE 5 MG PO TABS
5.0000 mg | ORAL_TABLET | Freq: Four times a day (QID) | ORAL | Status: DC | PRN
  Filled 2020-11-05: qty 2

## 2020-11-05 MED ORDER — HYDRALAZINE HCL 20 MG/ML IJ SOLN: 10.0000 mg | INTRAMUSCULAR | Status: AC | PRN

## 2020-11-05 MED ORDER — MORPHINE 100MG IN NS 100ML (1MG/ML) PREMIX INFUSION
10.0000 mg/h | INTRAVENOUS | Status: DC
  Administered 2020-11-05: 5 mg/h via INTRAVENOUS
  Filled 2020-11-05: qty 100

## 2020-11-05 MED ORDER — SODIUM CHLORIDE 0.9% FLUSH: 3.0000 mL | Freq: Two times a day (BID) | INTRAVENOUS | Status: DC

## 2020-11-05 MED ORDER — SODIUM BICARBONATE 8.4 % IV SOLN
100.0000 meq | Freq: Once | INTRAVENOUS | Status: AC
  Administered 2020-11-05: 100 meq via INTRAVENOUS

## 2020-11-05 MED ORDER — IOHEXOL 350 MG/ML SOLN
INTRAVENOUS | Status: DC | PRN
  Administered 2020-11-05: 100 mL

## 2020-11-05 MED ORDER — DOPAMINE-DEXTROSE 3.2-5 MG/ML-% IV SOLN
INTRAVENOUS | Status: AC | PRN
  Administered 2020-11-05: 10 ug/kg/min via INTRAVENOUS

## 2020-11-05 MED ORDER — NOREPINEPHRINE 4 MG/250ML-% IV SOLN
INTRAVENOUS | Status: AC
  Filled 2020-11-05: qty 250

## 2020-11-05 MED ORDER — NOREPINEPHRINE BITARTRATE 1 MG/ML IV SOLN
INTRAVENOUS | Status: AC | PRN
  Administered 2020-11-05: 10 ug/min via INTRAVENOUS

## 2020-11-05 MED ORDER — SODIUM CHLORIDE 0.9 % IV SOLN
INTRAVENOUS | Status: AC | PRN
  Administered 2020-11-05: 100 mL/h via INTRAVENOUS

## 2020-11-05 MED ORDER — SODIUM BICARBONATE 8.4 % IV SOLN
INTRAVENOUS | Status: DC
  Filled 2020-11-05: qty 850

## 2020-11-05 MED ORDER — TRAMADOL HCL 50 MG PO TABS: 50.0000 mg | ORAL_TABLET | Freq: Four times a day (QID) | ORAL | Status: DC | PRN

## 2020-11-05 MED ORDER — HEPARIN SODIUM (PORCINE) 5000 UNIT/ML IJ SOLN
4000.0000 [IU] | Freq: Once | INTRAMUSCULAR | Status: AC
  Administered 2020-11-05: 4000 [IU] via INTRAVENOUS

## 2020-11-05 MED ORDER — HEPARIN SODIUM (PORCINE) 1000 UNIT/ML IJ SOLN
INTRAMUSCULAR | Status: AC
  Filled 2020-11-05: qty 1

## 2020-11-05 MED ORDER — LIDOCAINE HCL (PF) 1 % IJ SOLN
INTRAMUSCULAR | Status: AC
  Filled 2020-11-05: qty 30

## 2020-11-05 MED ORDER — LABETALOL HCL 5 MG/ML IV SOLN: 10.0000 mg | INTRAVENOUS | Status: AC | PRN

## 2020-11-05 MED ORDER — PERFLUTREN LIPID MICROSPHERE
1.0000 mL | INTRAVENOUS | Status: AC | PRN
  Administered 2020-11-05: 1 mL via INTRAVENOUS
  Filled 2020-11-05: qty 10

## 2020-11-05 MED ORDER — LIDOCAINE HCL (PF) 1 % IJ SOLN
INTRAMUSCULAR | Status: DC | PRN
  Administered 2020-11-05: 15 mL

## 2020-11-05 MED ORDER — HEPARIN SODIUM (PORCINE) 5000 UNIT/ML IJ SOLN: 5000.0000 [IU] | Freq: Three times a day (TID) | INTRAMUSCULAR | Status: DC

## 2020-11-05 MED ORDER — SODIUM CHLORIDE 0.9 % IV SOLN
INTRAVENOUS | Status: DC | PRN
  Administered 2020-11-05: 1 mg/kg/h via INTRAVENOUS

## 2020-11-05 MED ORDER — TRAMADOL HCL 50 MG PO TABS
50.0000 mg | ORAL_TABLET | Freq: Two times a day (BID) | ORAL | Status: DC | PRN
  Administered 2020-11-05: 100 mg via ORAL
  Filled 2020-11-05: qty 2

## 2020-11-05 MED ORDER — SODIUM BICARBONATE 8.4 % IV SOLN
INTRAVENOUS | Status: AC
  Filled 2020-11-05: qty 100

## 2020-11-05 MED ORDER — LACTATED RINGERS IV BOLUS (SEPSIS)
1000.0000 mL | Freq: Once | INTRAVENOUS | Status: AC
  Administered 2020-11-05: 1000 mL via INTRAVENOUS

## 2020-11-05 MED ORDER — NITROGLYCERIN 1 MG/10 ML FOR IR/CATH LAB
INTRA_ARTERIAL | Status: AC
  Filled 2020-11-05: qty 10

## 2020-11-05 MED ORDER — ONDANSETRON HCL 4 MG/2ML IJ SOLN
INTRAMUSCULAR | Status: DC | PRN
  Administered 2020-11-05: 4 mg via INTRAVENOUS

## 2020-11-05 MED ORDER — HEPARIN (PORCINE) IN NACL 1000-0.9 UT/500ML-% IV SOLN
INTRAVENOUS | Status: AC
  Filled 2020-11-05: qty 1000

## 2020-11-05 MED ORDER — ASPIRIN 81 MG PO CHEW
81.0000 mg | CHEWABLE_TABLET | Freq: Every day | ORAL | Status: DC
  Administered 2020-11-05: 81 mg via ORAL
  Filled 2020-11-05: qty 1

## 2020-11-05 MED ORDER — DOPAMINE-DEXTROSE 3.2-5 MG/ML-% IV SOLN: 0.0000 ug/kg/min | INTRAVENOUS | Status: DC

## 2020-11-05 MED ORDER — SODIUM CHLORIDE 0.9 % IV SOLN: 250.0000 mL | INTRAVENOUS | Status: DC | PRN

## 2020-11-05 MED ORDER — EPINEPHRINE HCL 5 MG/250ML IV SOLN IN NS
0.5000 ug/min | INTRAVENOUS | Status: DC
  Administered 2020-11-05: 1 ug/min via INTRAVENOUS
  Filled 2020-11-05: qty 250

## 2020-11-05 MED ORDER — DOPAMINE-DEXTROSE 3.2-5 MG/ML-% IV SOLN
INTRAVENOUS | Status: AC
  Filled 2020-11-05: qty 250

## 2020-11-05 MED ORDER — ONDANSETRON HCL 4 MG/2ML IJ SOLN: 4.0000 mg | Freq: Four times a day (QID) | INTRAMUSCULAR | Status: DC | PRN

## 2020-11-05 MED ORDER — NITROGLYCERIN 1 MG/10 ML FOR IR/CATH LAB
INTRA_ARTERIAL | Status: DC | PRN
  Administered 2020-11-05 (×2): 100 ug

## 2020-11-05 MED ORDER — ACETAMINOPHEN 325 MG PO TABS: 650.0000 mg | ORAL_TABLET | ORAL | Status: DC | PRN

## 2020-11-05 MED ORDER — MILRINONE LACTATE IN DEXTROSE 20-5 MG/100ML-% IV SOLN
0.2500 ug/kg/min | INTRAVENOUS | Status: DC
  Administered 2020-11-05: 0.25 ug/kg/min via INTRAVENOUS

## 2020-11-05 MED ORDER — BIVALIRUDIN TRIFLUOROACETATE 250 MG IV SOLR
INTRAVENOUS | Status: AC
  Filled 2020-11-05: qty 250

## 2020-11-05 MED ORDER — TICAGRELOR 90 MG PO TABS
ORAL_TABLET | ORAL | Status: AC
  Filled 2020-11-05: qty 2

## 2020-11-05 MED ORDER — VERAPAMIL HCL 2.5 MG/ML IV SOLN
INTRAVENOUS | Status: AC
  Filled 2020-11-05: qty 2

## 2020-11-05 MED ORDER — MORPHINE 100MG IN NS 100ML (1MG/ML) PREMIX INFUSION: 1.0000 mg/h | INTRAVENOUS | Status: DC

## 2020-11-05 MED ORDER — EPINEPHRINE 0.1 MG/10ML (10 MCG/ML) SYRINGE FOR IV PUSH (FOR BLOOD PRESSURE SUPPORT): 5.0000 ug | PREFILLED_SYRINGE | Freq: Once | INTRAVENOUS | Status: DC | PRN

## 2020-11-05 MED ORDER — HEPARIN (PORCINE) IN NACL 1000-0.9 UT/500ML-% IV SOLN
INTRAVENOUS | Status: DC | PRN
  Administered 2020-11-05 (×2): 500 mL

## 2020-11-05 SURGICAL SUPPLY — 17 items
BALLN SAPPHIRE 2.0X12 (BALLOONS) ×2
BALLOON SAPPHIRE 2.0X12 (BALLOONS) ×1 IMPLANT
CATH INFINITI 5FR MULTPACK ANG (CATHETERS) ×2 IMPLANT
CATH VISTA GUIDE 6FR H STICK (CATHETERS) ×2 IMPLANT
CATH VISTA GUIDE 6FR JR4 (CATHETERS) ×2 IMPLANT
CATH VISTA GUIDE 6FR XBRCA (CATHETERS) ×2 IMPLANT
GLIDESHEATH SLEND SS 6F .021 (SHEATH) ×2 IMPLANT
KIT ENCORE 26 ADVANTAGE (KITS) ×2 IMPLANT
KIT HEART LEFT (KITS) ×2 IMPLANT
PACK CARDIAC CATHETERIZATION (CUSTOM PROCEDURE TRAY) ×2 IMPLANT
SHEATH PINNACLE 6F 10CM (SHEATH) ×2 IMPLANT
SHEATH PINNACLE 7F 10CM (SHEATH) ×2 IMPLANT
TRANSDUCER W/STOPCOCK (MISCELLANEOUS) ×2 IMPLANT
TUBING CIL FLEX 10 FLL-RA (TUBING) ×2 IMPLANT
WIRE COUGAR XT STRL 190CM (WIRE) ×2 IMPLANT
WIRE EMERALD 3MM-J .035X150CM (WIRE) ×2 IMPLANT
WIRE PT2 MS 185 (WIRE) ×2 IMPLANT

## 2020-11-06 ENCOUNTER — Encounter (HOSPITAL_COMMUNITY): Payer: Self-pay | Admitting: Cardiovascular Disease

## 2020-11-06 LAB — PATHOLOGIST SMEAR REVIEW

## 2020-11-06 MED FILL — Verapamil HCl IV Soln 2.5 MG/ML: INTRAVENOUS | Qty: 2 | Status: AC

## 2020-11-06 MED FILL — Ticagrelor Tab 90 MG: ORAL | Qty: 2 | Status: AC

## 2020-11-06 MED FILL — Nitroglycerin IV Soln 100 MCG/ML in D5W: INTRA_ARTERIAL | Qty: 10 | Status: AC

## 2020-11-07 ENCOUNTER — Other Ambulatory Visit: Payer: PPO

## 2020-11-10 LAB — CULTURE, BLOOD (ROUTINE X 2)
Culture: NO GROWTH
Culture: NO GROWTH
Special Requests: ADEQUATE
Special Requests: ADEQUATE

## 2020-11-11 ENCOUNTER — Telehealth: Payer: Self-pay | Admitting: Family Medicine

## 2020-11-11 ENCOUNTER — Encounter: Payer: PPO | Admitting: Family Medicine

## 2020-11-11 NOTE — Telephone Encounter (Signed)
Pt passed away last week in the hospital.  Called wife to express my condolences.  She was appreciative of the call.

## 2020-11-12 ENCOUNTER — Inpatient Hospital Stay: Payer: PPO

## 2020-11-13 MED FILL — Medication: Qty: 1 | Status: AC

## 2020-11-19 NOTE — Progress Notes (Signed)
   Patient continued to deteriorate with respiratory distress and hemodynamic instability.   Family arrived at bedside and patient switched to comfort care.   Morphine gtt started. Pressors stopped.   I stayed with family at bedside.   Patient passed at 7:08pm with his wife and son at the bedside.   Additional CCT 45 mins.   Glori Bickers, MD  7:13 PM

## 2020-11-19 NOTE — ED Notes (Signed)
Pt transported to cath lab with cath lab pads and zoll on. [name] sent to second floor waiting room with pt's belongings.

## 2020-11-19 NOTE — H&P (Addendum)
Cardiology Admission History and Physical:   Patient ID: Blake Peterson; MRN: [id number]; DOB: September 16, 1941   Admission date: 11/21/2020  Primary Care Provider: Ria Bush, MD Primary Cardiologist: Shelva Majestic, MD  New Primary Electrophysiologist: None    Chief Complaint:  STEMI  Patient Profile:   Blake Peterson is a 80 y.o. male with a history of stage IV papillary adenocarcinoma of bladder with metastasis, on palliative chemotherapy, RA, DM2, HTN, HLD, who came to the hospital with weakness and was diagnosed with STEMI.  History of Present Illness:   Blake Peterson was complaining of severe leg weakness this morning.  He fell while he was walking to the bathroom.  EMS was called by his wife because she could not get them up.  The EMS ECG showed inferior ST elevation and he was transported emergently to the Cath Lab.  Cath results are below.  He has significant CAD, but they were able to cross the RCA, but not able to get a wire down the full length of the vessel.  He was able to reduce the stenosis in the proximal vessel, but full intervention was deferred.  He was felt to be in cardiogenic shock, and was started on Levophed and dopamine.  Currently he is without chest pain or shortness of breath.  He has a history of renal insufficiency as well as urinary retention with a peak creatinine of 7.48 in January 2022.  However, his creatinine was 1.53 prior to this admission.  His LFTs have been normal until now.  His sodium has been normal until now.  Anion gap has never been this high.  His white count has never been this high and he has not been significantly anemic until now.   Past Medical History:  Diagnosis Date  . Basal cell carcinoma 12/2016   R anterior tibia  . Cataract   . Diverticulosis of colon   . History of colonic polyps   . HLD (hyperlipidemia)   . HTN (hypertension)   . Hypertensive retinopathy of both eyes, grade 1    [name]  . Obesity, Class  I, BMI 30-34.9   . Prediabetes   . 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 CATARACT EXTRACTION, BILATERAL  06/2017  . COLONOSCOPY  11/2012   mild-mod diverticulosis, int hem, rec rpt 5 yrs [facility] Plan)  . COLONOSCOPY  01/2018   2 TA, mod diverticulosis, rpt 5 yrs [facility] Plan)  . CYSTOSCOPY WITH INJECTION N/A 08/30/2020   Procedure: CYSTOSCOPY WITH INJECTION;  Surgeon: Alexis Frock, MD;  Location: [facility];  Service: Urology;  Laterality: N/A;  . IR IMAGING GUIDED PORT INSERTION  09/23/2020  . IR NEPHROSTOMY PLACEMENT LEFT  10/10/2020  . IR NEPHROSTOMY PLACEMENT RIGHT  10/10/2020  . LAPAROSCOPY  08/30/2020   Procedure: LAPAROSCOPY WITH PELVIC LYMPH NODE DISSECTION, LEFT URETERAL LYSIS;  Surgeon: Alexis Frock, MD;  Location: [facility];  Service: Urology;;  . POLYPECTOMY    . TRANSURETHRAL RESECTION OF BLADDER TUMOR N/A 06/12/2019   Procedure: TRANSURETHRAL RESECTION OF BLADDER TUMOR (TURBT) WITH GEMCITABINE IN PACU;  Surgeon: Lucas Mallow, MD;  Location: [facility];  Service: Urology;  Laterality: N/A;  . TRANSURETHRAL RESECTION OF BLADDER TUMOR N/A 07/14/2019   Procedure: TRANSURETHRAL RESECTION OF BLADDER TUMOR (TURBT);  Surgeon: Lucas Mallow, MD;  Location: [facility];  Service: Urology;  Laterality: N/A;  . UMBILICAL HERNIA REPAIR  01/07/04     Medications Prior  to Admission: Prior to Admission medications   Medication Sig Start Date End Date Taking? Authorizing Provider  atenolol (TENORMIN) 50 MG tablet TAKE 1 TABLET BY MOUTH EVERY DAY Patient taking differently: Take 50 mg by mouth daily. 10/08/20   Ria Bush, MD  atorvastatin (LIPITOR) 80 MG tablet Take 0.5 tablets (40 mg total) by mouth daily. 10/25/20   Ria Bush, MD  Cholecalciferol (VITAMIN D-3 PO) Take 50 mcg by mouth daily.     [provider]  folic acid (FOLVITE) 1 MG tablet Take 1 mg by mouth daily. 04/01/19   [provider]  prochlorperazine  (COMPAZINE) 10 MG tablet TAKE 1 TABLET BY MOUTH EVERY 6 HOURS AS NEEDED FOR NAUSEA OR VOMITING. 10/31/20   Wyatt Portela, MD  vitamin B-12 (CYANOCOBALAMIN) 1000 MCG tablet Take 1,000 mcg by mouth daily.    [provider]     Allergies:    Allergies  Allergen Reactions  . Naprosyn [Naproxen] Swelling    Fingers and ankles swelling    Social History:   Social History   Socioeconomic History  . Marital status: Married    Spouse name: Not on file  . Number of children: 3  . Years of education: Not on file  . Highest education level: Not on file  Occupational History  . 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 Smoking status: Former Smoker    Quit date: 11/10/1984    Years since quitting: 36.0  . Smokeless tobacco: Never Used  Vaping Use  . Vaping Use: Never used  Substance and Sexual Activity  . Alcohol use: Not Currently    Alcohol/week: 0.0 standard drinks  . Drug use: No  . Sexual activity: Never  Other Topics Concern  . Not on file  Social History Narrative   R handed   Lives with wife in single story home   Occupation: retired, was Clinical biochemist   Activity: enoys golfing, tries walking 3x/wk   Diet: good water, fruits/vegetables daily      Tested at risk of OSA during hospitalization (06/2019)   Social Determinants of Health   Financial Resource Strain: Not on file  Food Insecurity: Not on file  Transportation Needs: Not on file  Physical Activity: Not on file  Stress: Not on file  Social Connections: Not on file  Intimate Partner Violence: Not on file    Family History:  The patient's family history includes Colon cancer (age of onset: 37) in his sister; Colon polyps in his sister; Heart attack (age of onset: 31) in his father; Hypertension in his sister. There is no history of Esophageal cancer, Rectal cancer, or Stomach cancer.   The patient He indicated that his mother is deceased. He indicated that his father is  deceased. He indicated that all of his three sisters are alive. He indicated that the status of his neg hx is unknown.   ROS:  Please see the history of present illness.  All other ROS reviewed and negative.     Physical Exam/Data:   Vitals:   November 16, 2020 0909 11-16-20 0912 November 16, 2020 0915 11/16/2020 0917  BP: (!) 104/52 (!) 105/47 (!) 95/48 (!) 97/45  Pulse: (!) 130 (!) 232 85 (!) 117  Resp: (!) 29 (!) 43 (!) 24 (!) 22  Temp:      TempSrc:      SpO2: (!) 0% (!) 0% (!) 0% (!) 0%  Weight:      Height:       No  intake or output data in the 24 hours ending 11-30-2020 1108 Filed Weights   11/30/2020 0725  Weight: 80.3 kg   Body mass index is 25.4 kg/m.  General:  Well nourished, well developed, male in no acute distress HEENT: normal for agE Lymph: no adenopathy Neck:  JVD not elevated Endocrine:  No thryomegaly Vascular: No carotid bruits; upper extremity pulses 2+ bilaterally  Cardiac:  normal S1, S2; RRR; no murmur, no rub or gallop  Lungs:  clear to auscultation bilaterally, no wheezing, rhonchi or rales  Abd: soft, nontender, no hepatomegaly  Ext: No edema, cyanosis of both feet, distal pulses poor Musculoskeletal:  No deformities, BUE and BLE strength normal and equal Skin: warm and dry  Neuro:  CNs 2-12 intact, no focal abnormalities noted Psych:  Normal affect    EKG:  The ECG was personally reviewed: Sinus rhythm, heart rate 67, inferior ST elevation meeting STEMI criteria Telemetry: Sinus rhythm, PVCs and PACs  Relevant CV Studies:  ECHO: Ordered  CATH: 11/30/2020  1st Diag lesion is 80% stenosed.  Mid LAD to Dist LAD lesion is 90% stenosed.  Non-stenotic Ost Cx to Prox Cx lesion.  1st Mrg lesion is 40% stenosed.  Mid Cx lesion is 40% stenosed.  Ost RCA to Prox RCA lesion is 100% stenosed.  Prox RCA to Mid RCA lesion is 100% stenosed.  Post intervention, there is a 85% residual stenosis.     Intervention        Chemistry Recent Labs  Lab  Nov 30, 2020 0730  NA 130*  K 4.3  CL 96*  CO2 8*  GLUCOSE 227*  BUN 41*  CREATININE 3.28*  CALCIUM 8.2*  GFRNONAA 19*  ANIONGAP 26*    Recent Labs  Lab November 30, 2020 0730  PROT 6.0*  ALBUMIN 2.0*  AST 3,604*  ALT 1,881*  ALKPHOS 206*  BILITOT 0.7   Hematology Recent Labs  Lab 2020/11/30 0730  WBC 53.6*  RBC 2.81*  HGB 8.8*  HCT 26.7*  MCV 95.0  MCH 31.3  MCHC 33.0  RDW 14.6  PLT 473*   Cardiac Enzymes  High Sensitivity Troponin:   Recent Labs  Lab 2020/11/30 0730  TROPONINIHS >27,000*     BNPNo results for input(s): BNP, PROBNP in the last 168 hours.  DDimer No results for input(s): DDIMER in the last 168 hours. Lipids:  Lab Results  Component Value Date   CHOL 131 09/26/2019   HDL 38.40 (L) 09/26/2019   LDLCALC 65 09/26/2019   TRIG 138.0 09/26/2019   CHOLHDL 3 09/26/2019   INR:  Lab Results  Component Value Date   INR 1.9 (H) Nov 30, 2020   INR 1.3 (H) 10/10/2020   A1c:  Lab Results  Component Value Date   HGBA1C 6.6 (A) 10/25/2020   Thyroid:  Lab Results  Component Value Date   TSH 1.673 04/24/2019    Radiology/Studies:  No results found.  Assessment and Plan:   1.  Inferior STEMI: -At this time, medical therapy is the best option.  Dr. Claiborne Billings was able to restore a small amount of flow to the proximal RCA, but could not wire the distal RCA. -At this time, the 80% diagonal and 90% LAD are being treated medically. -He is on heparin no statin due to his abnormal LFTs, aspirin, no beta-blocker at this time due to hypotension -check echo  2.  Abnormal LFTs -He had no suspicious hepatic lesions on CT from 10/03/2020 -This may be shock liver secondary to hypoperfusion from cardiogenic shock -Support  blood pressure and continue to follow  3.  Elevated white count, 39% immature granulocytes -Check a chest x-ray, portable since he is still unstable -Check a UA -Check blood cultures -He is getting chemotherapy and his white count has not previously  been abnormal - may need Oncology to see  4.  Cardiogenic shock -His blood pressure is improving on pressors and is heart rate is just over 100 -Continue to monitor closely, per Dr Haroldine Laws, can Coox and CVP's from [place] a cath but CVP may not be accurate >> will order -May need CHF team to see   Active Problems:   Acute MI, inferior wall ([place])   Cardiogenic shock ([place])   STEMI involving right coronary artery [facility])     For questions or updates, please contact [facility] Please consult [email or web address] for contact info under Cardiology/STEMI.    SignedRosaria Ferries, PA-C  12-03-20 11:08 AM   Patient seen and examined. Agree with assessment and plan.  Mr Blake Peterson is a 80 year old gentleman has a history of hypertension, hyperlipidemia, diabetes mellitus, and metastatic bladder cancer.  He had undergone attempt at rest cholecystectomy but this was aborted due to defecation of metastatic disease.  He has a history of recent acute kidney injury with creatinine at 7.38 on January 12 which improved to 1.3 on October 21, 2020.  He recently initiated chemotherapy.  He denies any history of chest pain.  2 nights ago he experienced bilateral arm discomfort which persisted and subsequently improved with Tylenol.  He has noticed weakness.  Today he had fallen at home.  His wife was unable to get him up and called for EMS.  Upon EMS arrival, the patient was chest pain-free but his ECG showed inferior Q waves with mild associated ST elevation.  To [facility] emergency room where his initial blood pressure was in the 60s to 70s repeat ECG confirmed inferior Q waves with inferolateral ST elevation.  At that time I discussed situation both with the patient and his wife.  It was my feeling that most likely he had had a recent MI not acutely today following his inferior wall.  In emergency room he was given heparin 4000 units and catheterization procedure was discussed with the patient and the wife for  further evaluation.  Upon arrival to the Cath Lab, his blood pressure was in the 70s.  The procedure was done through the femoral approach.  He was started on Levophed and dopamine and intravenous fluids.  Acute catheterization revealed coronary calcification high-grade disease in his diagonal, and mid LAD, significant aneurysm of the proximal circumflex with mild to moderate stenoses in the circumflex marginal, mid AV groove circumflex and distal marginal vessel.  His RCA was occluded just beyond the ostium with TIMI 0 flow.  The RCA was extensively calcified.  He did not have significant left to right collaterals.  Attempted PCI was performed.  A wire with balloon support was able to cross the ostial occlusion but the the wire could never get beyond the proximal to mid segment.  The guidewire was exchanged to a achieve a more upward takeoff and low-level balloon dilatation was done at the original site of occlusion.  This did slightly open up some of the proximal vessel but it appears that the proximal to mid vessel is chronically occluded severely diseased.  Several wires were unable unable to go beyond this segment which seem to loop into possible sidebranch.  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  During the procedure, laboratory began to come back which revealed a creatinine of 3.13.  Sodium was 130.  Anion gap 26, white blood count 53,600, hemoglobin 8.8 hematocrit 26.7, INR 1.9 and LFTs elevated with an ALT  of 1881 most likely is from either shock liver.  I suspect his INR elevation most likely is due to his markedly increased LFTs.  During the procedure, the patient was started on Angiomax prior to the intervention.  He never received Brilinta since the intervention was  not successful uncertain as to his bleeding status.  Although the initial plan was to pull the sheath in 2 hours once his INR came back it was felt that the INR should be rechecked prior to sheath pulling.  The right femoral venous sheath was kept in place the patient was getting inotropic support.  He left the Cath Lab chest pain-free with a systolic blood pressure of 115/70.  He was transported to the coronary care unit.  Subsequent laboratory showed a troponin greater than 27,000.  INR is currently being repeated.  Lower extremity Doppler studies have shown decreased pulses lower extremities and his left foot is somewhat mottled.  At present, blood pressure is 001/74 systolic, heart rate 68 - 72 with probable atrial fibrillation without discernible P waves.  No wheezing.  Decreased breath sounds.  Rhythm irregular regular with pulse in the upper 60s to 70s.  Abdomen soft.  Arterial venous sheaths present in the right femoral region.  Diminished distal pulses on the right but present.  Good left femoral pulse.  Diminished pulses in the left foot.  Co-ox will be obtained by the Port-A-Cath.  Repeat twelve-lead ECG.  Agree with CHF rotation.  Check 2D echo Doppler study and follow-up lower extremity Dopplers.  Will need hematoplogyoncology evaluation and probable nephrology consultation during admission.   Troy Sine, MD, [facility] 11/10/2020 2:01 PM

## 2020-11-19 NOTE — Consult Note (Signed)
Patient name: Blake Peterson MRN: [id number] DOB: [birth year] Sex: male    HPI: Blake Peterson is a 80 y.o. male in for bilateral lower extremity ischemia.  He is critically ill and in shock.  Presented today with what appears to be major myocardial infarct.  Remains unstable.  Noted to have mottling and cyanosis of both feet and both hands.  Noninvasive studies suggested severe ischemia bilaterally.  Past Medical History:  Diagnosis Date  . Basal cell carcinoma 12/2016   R anterior tibia  . Cataract   . Diverticulosis of colon   . History of colonic polyps   . HLD (hyperlipidemia)   . HTN (hypertension)   . Hypertensive retinopathy of both eyes, grade 1    [name]  . 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 Prediabetes   . Wears hearing aid in left ear     Family History  Problem Relation Age of Onset  . Hypertension Sister   . Colon cancer Sister 64  . Colon polyps Sister   . Heart attack Father 44  . Esophageal cancer Neg Hx   . Rectal cancer Neg Hx   . Stomach cancer Neg Hx     SOCIAL HISTORY: Social History   Tobacco Use  . Smoking status: Former Smoker    Quit date: 11/10/1984    Years since quitting: 36.0  . Smokeless tobacco: Never Used  Substance Use Topics  . Alcohol use: Not Currently    Alcohol/week: 0.0 standard drinks    Allergies  Allergen Reactions  . Naprosyn [Naproxen] Swelling    Fingers and ankles swelling    Current Facility-Administered Medications  Medication Dose Route Frequency Provider Last Rate Last Admin  . 0.9 %  sodium chloride infusion  250 mL Intravenous PRN Troy Sine, MD      . acetaminophen (TYLENOL) tablet 650 mg  650 mg Oral Q4H PRN Troy Sine, MD      . aspirin chewable tablet 81 mg  81 mg Oral Daily Troy Sine, MD   81 mg at 06-Nov-2020 1230  . DOPamine (INTROPIN) 800 mg in dextrose 5 % 250 mL (3.2 mg/mL) infusion  0-20 mcg/kg/min Intravenous Titrated Barrett, Rhonda G, PA-C 11.29 mL/hr at 06-Nov-2020 1513 7.5  mcg/kg/min at 06-Nov-2020 1513  . heparin injection 5,000 Units  5,000 Units Subcutaneous Q8H Shelva Majestic A, MD      . milrinone (PRIMACOR) 20 MG/100 ML (0.2 mg/mL) infusion  0.25 mcg/kg/min Intravenous Continuous Bensimhon, Shaune Pascal, MD 6.02 mL/hr at November 06, 2020 1721 0.25 mcg/kg/min at 11/06/20 1721  . norepinephrine (LEVOPHED) 4mg  in 240mL premix infusion  0-40 mcg/min Intravenous Titrated Troy Sine, MD 26.3 mL/hr at 11-06-20 1514 7 mcg/min at 11-06-20 1514  . ondansetron (ZOFRAN) injection 4 mg  4 mg Intravenous Q6H PRN Troy Sine, MD      . prochlorperazine (COMPAZINE) tablet 5-10 mg  5-10 mg Oral Q6H PRN Barrett, Rhonda G, PA-C      . sodium chloride flush (NS) 0.9 % injection 3 mL  3 mL Intravenous Q12H Shelva Majestic A, MD      . sodium chloride flush (NS) 0.9 % injection 3 mL  3 mL Intravenous PRN Troy Sine, MD      . traMADol Veatrice Bourbon) tablet 50-100 mg  50-100 mg Oral Q12H PRN Lyndee Leo, RPH   100 mg at 11/06/20 1516    REVIEW OF SYSTEMS:  Reviewed and as history and physical with nothing to  add  PHYSICAL EXAM: Vitals:   Nov 10, 2020 0912 10-Nov-2020 0915 November 10, 2020 0917 11/10/2020 1548  BP: (!) 105/47 (!) 95/48 (!) 97/45   Pulse: (!) 232 85 (!) 117   Resp: (!) 43 (!) 24 (!) 22   Temp:    (!) 96.2 F (35.7 C)  TempSrc:    Axillary  SpO2: (!) 0% (!) 0% (!) 0%   Weight:      Height:        GENERAL: The patient is a well-nourished male, in no acute distress. The vital signs are documented above.  VASCULAR: Absent popliteal and pedal pulses.  He does have biphasic left popliteal signal and monophasic anterior tibial just above the ankle on the left.  On the right he has audible popliteal and audible dorsalis pedis signal. PULMONARY: There is good air exchange bilaterally without wheezing or rales. ABDOMEN: Soft and non-tender with normal pitched bowel sounds.  MUSCULOSKELETAL: There are no major deformities or cyanosis. NEUROLOGIC: No focal weakness or paresthesias are  detected. SKIN: Both hands and both feet are cold and blue and mottled.  He has normal temperature and skin color to just below the knees bilaterally. PSYCHIATRIC: The patient has a normal affect.  DATA:   Old CT scan from September 2021 revealed no evidence of aortoiliac occlusive disease in his proximal superficial femoral arteries were patent  Marked laboratory abnormality with white count greater than 70,000 and coagulopathic  MEDICAL ISSUES:  Critically ill patient in shock.  I do not feel this is related to mechanical occlusion.  Most likely relates to profound shock and potential micro emboli.  He has affecting both hands and both feet.  Poor prognosis.  No role for surgical intervention.  We will follow with you    Curt Jews [facility] [phone]

## 2020-11-19 NOTE — Progress Notes (Signed)
ABI completed.  RN given results.   Please see CV Proc for preliminary results.   Vonzell Schlatter, RVT

## 2020-11-19 NOTE — Progress Notes (Signed)
*  PRELIMINARY RESULTS* Echocardiogram 2D Echocardiogram with definity has been performed.  Blake Peterson November 18, 2020, 3:35 PM

## 2020-11-19 NOTE — Consult Note (Incomplete)
Advanced Heart Failure Team Consult Note   Primary Physician: Ria Bush, MD PCP-Cardiologist:  Shelva Majestic, MD  Reason for Consultation: Peri MI Cardiogenic Shock   HPI:    Blake Peterson is seen today for evaluation of peri MI cardiogenic shock at the request of Dr. Claiborne Billings, Cardiology.    80 y/o male w/ active metastatic bladder cancer being treated w/ palliative chemotherapy, HTN, T2DM, HLD and rheumatoid arthritis, admitted 2/15 w/ acute inferior STEMI c/b cardiogenic shock. Initial presentation with hypotension which responded to intravenous fluids, levophed and dopamine inotropic therapy.   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 Felt to have probable out of hospital inferior MI several days ago.     Hs Trop > 27,000. Lactic acid >11. WBC 70K. AF. COVID negative. SCr 3.3 (prior baseline ~1.5). K 4.3. AST 3,604. ALT 1,881.   Echo shows cor pulmonale w/ severe RV dysfunction. LVEF preserved 55-60%.   Currently on dopamine 7.5 and NE 6. MAP 67 by a-line. Co-ox low at 42%.   He is alert and oriented. CP free currently. No dyspnea but showing signs of hypoperfusion. Distal extremities moltted and cyanotic. Complaining of left leg pain. Cath performed via femoral artery. Vascular surgery has been consulted.  Hgb 8.2.     Review of Systems: [y] = yes, [ ]  = no   . General: Weight gain [ ] ; Weight loss [ ] ; Anorexia [ ] ; Fatigue [ ] ; Fever [ ] ; Chills [ ] ; Weakness [Y ]  . Cardiac: Chest pain/pressure [ ] ; Resting SOB  [ ] ; Exertional SOB [ ] ; Orthopnea [ ] ; Pedal Edema [ ] ; Palpitations [ ] ; Syncope [ ] ; Presyncope [ ] ; Paroxysmal nocturnal dyspnea[ ]   . Pulmonary: Cough [ ] ; Wheezing[ ] ; Hemoptysis[ ] ; Sputum [ ] ; Snoring [ ]   . GI: Vomiting[ ] ; Dysphagia[ ] ; Melena[ ] ; Hematochezia [ ] ; Heartburn[ ] ; Abdominal pain [ ] ; Constipation [ ] ; Diarrhea [ ] ; BRBPR [ ]   . GU: Hematuria[ ] ; Dysuria [ ] ; Nocturia[ ]   . Vascular: Pain in legs with walking [ ] ; Pain in feet with lying flat [ Y]; Non-healing sores [ ] ; Stroke [ ] ; TIA [ ] ; Slurred speech [ ] ;  . Neuro: Headaches[ ] ; Vertigo[ ] ; Seizures[ ] ; Paresthesias[ ] ;Blurred vision [ ] ; Diplopia [ ] ; Vision changes [ ]   . Ortho/Skin: Arthritis [Y ]; Joint pain [ ] ; Muscle pain [ ] ; Joint swelling [ ] ; Back Pain [ ] ; Rash [ ]   . Psych: Depression[ ] ; Anxiety[ ]   . Heme: Bleeding problems [ ] ; Clotting disorders [ ] ; Anemia [ ]   . Endocrine: Diabetes [ Y]; Thyroid dysfunction[ ]   Home Medications Prior to Admission medications   Medication Sig Start Date End Date Taking? Authorizing Provider  atenolol (TENORMIN) 50 MG tablet TAKE 1 TABLET BY MOUTH EVERY DAY Patient taking differently: Take 50 mg by mouth daily. 10/08/20   Ria Bush, MD  atorvastatin (LIPITOR) 80 MG tablet Take 0.5 tablets (40 mg total) by mouth daily. 10/25/20   Ria Bush, MD  Cholecalciferol (VITAMIN D-3 PO)  Take 50 mcg by mouth daily.     [provider]  folic acid (FOLVITE) 1 MG tablet Take 1 mg by mouth daily. 04/01/19   [provider]  prochlorperazine (COMPAZINE) 10 MG tablet TAKE 1 TABLET BY MOUTH EVERY 6 HOURS AS NEEDED FOR NAUSEA OR VOMITING. 10/31/20   Wyatt Portela, MD  vitamin B-12 (CYANOCOBALAMIN) 1000 MCG tablet Take 1,000 mcg by mouth daily.    [provider]    Past Medical History: Past Medical History:  Diagnosis Date  . Basal cell carcinoma 12/2016   R anterior tibia  . Cataract   . Diverticulosis of colon   . History of colonic  polyps   . HLD (hyperlipidemia)   . HTN (hypertension)   . Hypertensive retinopathy of both eyes, grade 1    [name]  . 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 Prediabetes   . Wears hearing aid in left ear     Past Surgical History: Past Surgical History:  Procedure Laterality Date  . CATARACT EXTRACTION, BILATERAL  06/2017  . COLONOSCOPY  11/2012   mild-mod diverticulosis, int hem, rec rpt 5 yrs [facility] Plan)  . COLONOSCOPY  01/2018   2 TA, mod diverticulosis, rpt 5 yrs [facility] Plan)  . CYSTOSCOPY WITH INJECTION N/A 08/30/2020   Procedure: CYSTOSCOPY WITH INJECTION;  Surgeon: Alexis Frock, MD;  Location: [facility];  Service: Urology;  Laterality: N/A;  . IR IMAGING GUIDED PORT INSERTION  09/23/2020  . IR NEPHROSTOMY PLACEMENT LEFT  10/10/2020  . IR NEPHROSTOMY PLACEMENT RIGHT  10/10/2020  . LAPAROSCOPY  08/30/2020   Procedure: LAPAROSCOPY WITH PELVIC LYMPH NODE DISSECTION, LEFT URETERAL LYSIS;  Surgeon: Alexis Frock, MD;  Location: [facility];  Service: Urology;;  . POLYPECTOMY    . TRANSURETHRAL RESECTION OF BLADDER TUMOR N/A 06/12/2019   Procedure: TRANSURETHRAL RESECTION OF BLADDER TUMOR (TURBT) WITH GEMCITABINE IN PACU;  Surgeon: Lucas Mallow, MD;  Location: [facility];  Service: Urology;  Laterality: N/A;  . TRANSURETHRAL RESECTION OF BLADDER TUMOR N/A 07/14/2019   Procedure: TRANSURETHRAL RESECTION OF BLADDER TUMOR (TURBT);  Surgeon: Lucas Mallow, MD;  Location: [facility];  Service: Urology;  Laterality: N/A;  . UMBILICAL HERNIA REPAIR  01/07/04    Family History: Family History  Problem Relation Age of Onset  . Hypertension Sister   . Colon cancer Sister 66  . Colon polyps Sister   . Heart attack Father 70  . Esophageal cancer Neg Hx   . Rectal cancer Neg Hx   . Stomach cancer Neg Hx     Social History: Social History   Socioeconomic History  . Marital status: Married    Spouse name: Not on file  . Number of children: 3  . Years of education: Not on file  . Highest  education level: Not on file  Occupational History  . 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 Smoking status: Former Smoker    Quit date: 11/10/1984    Years since quitting: 36.0  . Smokeless tobacco: Never Used  Vaping Use  . Vaping Use: Never used  Substance and Sexual Activity  . Alcohol use: Not Currently    Alcohol/week: 0.0 standard drinks  . Drug use: No  . Sexual activity: Never  Other Topics Concern  . Not on file  Social History Narrative   R handed   Lives with wife in single story home   Occupation: retired, was Clinical biochemist   Activity: Electrical engineer, tries walking  3x/wk   Diet: good water, fruits/vegetables daily      Tested at risk of OSA during hospitalization (06/2019)   Social Determinants of Health   Financial Resource Strain: Not on file  Food Insecurity: Not on file  Transportation Needs: Not on file  Physical Activity: Not on file  Stress: Not on file  Social Connections: Not on file    Allergies:  Allergies  Allergen Reactions  . Naprosyn [Naproxen] Swelling    Fingers and ankles swelling    Objective:    Vital Signs:   Temp:  [95 F (35 C)-96.8 F (36 C)] 95 F (35 C) (02/15 1800) Pulse Rate:  [25-232] 71 (02/15 1800) Resp:  [0-116] 17 (02/15 1830) BP: (54-131)/(23-118) 129/95 (02/15 1830) SpO2:  [0 %-100 %] 98 % (02/15 1800) Arterial Line BP: (113-178)/(37-75) 178/75 (02/15 1830) Weight:  [80.3 kg] 80.3 kg (02/15 0725) Last BM Date: 11/03/20  Weight change: Filed Weights   2020-11-17 0725  Weight: 80.3 kg    Intake/Output:   Intake/Output Summary (Last 24 hours) at 17-Nov-2020 1956 Last data filed at 2020-11-17 1843 Gross per 24 hour  Intake 1163.61 ml  Output -  Net 1163.61 ml      Physical Exam    General: elderly male, pale. No resp difficulty HEENT: normal Neck: supple. JVP . Carotids 2+ bilat; no bruits. No lymphadenopathy or thyromegaly appreciated. Cor: PMI nondisplaced. Regular rate &  rhythm. No rubs, gallops or murmurs. Lungs: clear Abdomen: soft, nontender, nondistended. No hepatosplenomegaly. No bruits or masses. Good bowel sounds. Extremities: mottled distal extremities and cyanotic digits no clubbing, rash, edema Neuro: alert & orientedx3, cranial nerves grossly intact. moves all 4 extremities w/o difficulty. Affect pleasant   Telemetry   NSR 60s   EKG    Afib w/ PACs inferior ST elevations, 67 bpm   Labs   Basic Metabolic Panel: Recent Labs  Lab 17-Nov-2020 0730 11-17-2020 0814 2020/11/17 1412  NA 130* 128*  --   K 4.3 4.0  --   CL 96* 98  --   CO2 8*  --   --   GLUCOSE 227* 187*  --   BUN 41* 40*  --   CREATININE 3.28* 3.10* 3.26*  CALCIUM 8.2*  --   --     Liver Function Tests: Recent Labs  Lab 11/17/2020 0730  AST 3,604*  ALT 1,881*  ALKPHOS 206*  BILITOT 0.7  PROT 6.0*  ALBUMIN 2.0*   No results for input(s): LIPASE, AMYLASE in the last 168 hours. No results for input(s): AMMONIA in the last 168 hours.  CBC: Recent Labs  Lab November 17, 2020 0730 11-17-2020 0814 2020/11/17 1412  WBC 53.6*  --  70.0*  NEUTROABS 21.4*  --   --   HGB 8.8* 8.2* 8.2*  HCT 26.7* 24.0* 25.5*  MCV 95.0  --  94.1  PLT 473*  --  506*    Cardiac Enzymes: No results for input(s): CKTOTAL, CKMB, CKMBINDEX, TROPONINI in the last 168 hours.  BNP: BNP (last 3 results) No results for input(s): BNP in the last 8760 hours.  ProBNP (last 3 results) No results for input(s): PROBNP in the last 8760 hours.   CBG: No results for input(s): GLUCAP in the last 168 hours.  Coagulation Studies: Recent Labs    11/17/20 0730 November 17, 2020 1412  LABPROT 21.5* 31.3*  INR 1.9* 3.1*     Imaging   CARDIAC CATHETERIZATION  Result Date: 2020/11/17  1st Diag lesion is 80% stenosed.  Mid LAD  to Dist LAD lesion is 90% stenosed.  Non-stenotic Ost Cx to Prox Cx lesion.  1st Mrg lesion is 40% stenosed.  Mid Cx lesion is 40% stenosed.  Ost RCA to Prox RCA lesion is 100% stenosed.   Post intervention, there is a 85% residual stenosis.  Prox RCA to Dist RCA lesion is 100% stenosed.  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 Initial presentation with hypotension, most likely secondary to large RCA/RV infarction which responded to intravenous fluids, levophed and dopamine inotropic therapy.  The patient left the catheterization laboratory pressure 18/70. RECOMMENDATION: An echo Doppler study will be obtained today.  Pro time will be rechecked since after the completion of the study the pro time came back elevated at 1.9 most likely from auto anticoagulation in the setting of markedly elevated liver function studies.  Plan to wean inotropic support as blood pressure allows.  He may ultimately require heart catheterization.  Discontinue atorvastatin with marked LFT elevation will most likely need hematology/oncology, nephrology, and CHF evaluations.   DG CHEST PORT 1 VIEW  Result Date: 2020/11/25 CLINICAL DATA:  Myocardial infarction. EXAM: PORTABLE CHEST 1 VIEW COMPARISON:  04/24/2019 FINDINGS: Low volume film. Right Port-A-Cath is new in the interval. The cardiopericardial silhouette is within normal limits for size. The lungs are clear without focal pneumonia, edema, pneumothorax or pleural effusion. Telemetry leads overlie the chest. IMPRESSION: No active disease. Electronically Signed   By: Misty Stanley M.D.   On: 11-25-2020 13:28   VAS [place] ABI WITH/WO TBI  Result Date: 11/25/2020 LOWER EXTREMITY  DOPPLER STUDY Indications: Rest pain, and No Pulses. High Risk         Hypertension, hyperlipidemia, Diabetes, past history of Factors:          smoking.  Comparison Study: No previous Performing Technologist: Vonzell Schlatter RVT  Examination Guidelines: A complete evaluation includes at minimum, Doppler waveform signals and systolic blood pressure reading at the level of bilateral brachial, anterior tibial, and posterior tibial arteries, when vessel segments are accessible. Bilateral testing is considered an integral part of a complete examination. Photoelectric Plethysmograph (PPG) waveforms and toe systolic pressure readings are included as required and additional duplex testing as needed. Limited examinations for reoccurring indications may be performed as noted.  ABI Findings: +--------+------------------+-----+----------+--------+ Right   Rt Pressure (mmHg)IndexWaveform  Comment  +--------+------------------+-----+----------+--------+ [id number]                                       +--------+------------------+-----+----------+--------+ PTA     176               1.45 monophasic         +--------+------------------+-----+----------+--------+ DP      132               1.09 monophasic         +--------+------------------+-----+----------+--------+ +--------+------------------+-----+--------+-------+ Left    Lt Pressure (mmHg)IndexWaveformComment +--------+------------------+-----+--------+-------+ [id number]                                    +--------+------------------+-----+--------+-------+  PTA     0                 0.00 absent          +--------+------------------+-----+--------+-------+ DP      0                 0.00 absent          +--------+------------------+-----+--------+-------+ Arterial wall calcification precludes accurate ankle pressures and Right ABI.  Summary: Right: Resting right ankle-brachial index indicates noncompressible right lower extremity  arteries. Left: Resting left ankle-brachial index indicates critical left limb ischemia. Absent-Unable to obtain signals or waveforms.  *See table(s) above for measurements and observations.    Preliminary    ECHOCARDIOGRAM COMPLETE  Result Date: 02-Dec-2020    ECHOCARDIOGRAM REPORT   Patient Name:   CHANLER MENDONCA Date of Exam: December 02, 2020 Medical Rec #:  [id number]        Height:       70.0 in Accession #:    [id number]       Weight:       177.0 lb Date of Birth:  July 03, 1941        BSA:          1.982 m Patient Age:    40 years         BP:           97/45 mmHg Patient Gender: M                HR:           117 bpm. Exam Location:  Inpatient Procedure: 2D Echo Indications:    Acute myocardial infarction, unspecified I21.9  History:        Patient has prior history of Echocardiogram examinations, most                 recent 04/25/2019. Arrythmias:Atrial Fibrillation; Risk                 Factors:Hypertension, Former Smoker and Dyslipidemia. Bladder                 cancer, Cardiogenic Shock.  Sonographer:    Leavy Cella Referring Phys: [place]  1. Left ventricular ejection fraction, by estimation, is 50 to 55%. The left ventricle has low normal function. The left ventricle has no regional wall motion abnormalities. There is mild concentric left ventricular hypertrophy. Left ventricular diastolic parameters are consistent with Grade II diastolic dysfunction (pseudonormalization). Elevated left atrial pressure. There is mild hypokinesis of the left ventricular, basal-mid inferior wall.  2. McConnell's sign is seen, generally considered pathognomonic for acute cor pulmonale (most commonly due to acute pulmonary embolism). Right ventricular systolic function is severely reduced. The right ventricular size is severely enlarged. There is normal pulmonary artery systolic pressure. The estimated right ventricular systolic pressure is 82.9 mmHg.  3. The mitral valve is normal in structure. Trivial  mitral valve regurgitation. No evidence of mitral stenosis.  4. Tricuspid valve regurgitation is mild to moderate.  5. The aortic valve is tricuspid. Aortic valve regurgitation is not visualized. Mild aortic valve sclerosis is present, with no evidence of aortic valve stenosis.  6. The inferior vena cava is dilated in size with >50% respiratory variability, suggesting right atrial pressure of 8 mmHg. Comparison(s): The left ventricular function is mildly worse. The right ventricular systolic function is significantly worse. The left ventricular wall motion abnormality is new. Consider RV infarction due to proximal right  coronary occlusion, although the right ventricular regional wall motion pattern is strongly suggestive of pulmonary embolism and RV involvement is markedly out of proportion to LV involvement. FINDINGS  Left Ventricle: Left ventricular ejection fraction, by estimation, is 50 to 55%. The left ventricle has low normal function. The left ventricle has no regional wall motion abnormalities. Mild hypokinesis of the left ventricular, basal-mid inferior wall.  Definity contrast agent was given IV to delineate the left ventricular endocardial borders. The left ventricular internal cavity size was normal in size. There is mild concentric left ventricular hypertrophy. Left ventricular diastolic parameters are consistent with Grade II diastolic dysfunction (pseudonormalization). Elevated left atrial pressure. Right Ventricle: McConnell's sign is seen, generally considered pathognomonic for acute cor pulmonale (most commonly due to acute pulmonary embolism). The right ventricular size is severely enlarged. No increase in right ventricular wall thickness. Right  ventricular systolic function is severely reduced. There is normal pulmonary artery systolic pressure. The tricuspid regurgitant velocity is 1.97 m/s, and with an assumed right atrial pressure of 8 mmHg, the estimated right ventricular systolic pressure   is 09.3 mmHg. Left Atrium: Left atrial size was normal in size. Right Atrium: Right atrial size was normal in size. Pericardium: There is no evidence of pericardial effusion. Mitral Valve: The mitral valve is normal in structure. Trivial mitral valve regurgitation. No evidence of mitral valve stenosis. Tricuspid Valve: The tricuspid valve is normal in structure. Tricuspid valve regurgitation is mild to moderate. Aortic Valve: The aortic valve is tricuspid. Aortic valve regurgitation is not visualized. Mild aortic valve sclerosis is present, with no evidence of aortic valve stenosis. Pulmonic Valve: The pulmonic valve was grossly normal. Pulmonic valve regurgitation is not visualized. Aorta: The aortic root and ascending aorta are structurally normal, with no evidence of dilitation. Venous: The inferior vena cava is dilated in size with greater than 50% respiratory variability, suggesting right atrial pressure of 8 mmHg. IAS/Shunts: The interatrial septum appears to be lipomatous. No atrial level shunt detected by color flow Doppler.  LEFT VENTRICLE PLAX 2D LVIDd:         3.00 cm  Diastology LVIDs:         1.90 cm  LV e' medial:    3.81 cm/s LV PW:         1.50 cm  LV E/e' medial:  17.4 LV IVS:        1.30 cm  LV e' lateral:   6.42 cm/s LVOT diam:     1.80 cm  LV E/e' lateral: 10.3 LVOT Area:     2.54 cm  RIGHT VENTRICLE RV S prime:     3.83 cm/s LEFT ATRIUM           Index       RIGHT ATRIUM           Index LA diam:      3.50 cm 1.77 cm/m  RA Area:     13.10 cm LA Vol (A2C): 29.5 ml 14.89 ml/m RA Volume:   27.00 ml  13.62 ml/m LA Vol (A4C): 35.0 ml 17.66 ml/m   AORTA Ao Root diam: 2.90 cm MITRAL VALVE               TRICUSPID VALVE MV Area (PHT): 2.95 cm    TR Peak grad:   15.5 mmHg MV Decel Time: 257 msec    TR Vmax:        197.00 cm/s MV E velocity: 66.40 cm/s MV A velocity: 57.00 cm/s  SHUNTS  MV E/A ratio:  1.16        Systemic Diam: 1.80 cm Dani Gobble Croitoru MD Electronically signed by Sanda Klein MD  Signature Date/Time: November 13, 2020/4:21:36 PM    Final      Medications:     Current Medications: . aspirin  81 mg Oral Daily  . heparin  5,000 Units Subcutaneous Q8H  . sodium chloride flush  3 mL Intravenous Q12H    Infusions: . sodium chloride    . epinephrine 1 mcg/min (2020/11/13 1843)  . milrinone 0.5 mcg/kg/min (11/13/2020 1843)  . morphine 5 mg/hr (11/13/2020 1843)  . norepinephrine (LEVOPHED) Adult infusion 18 mcg/min (2020-11-13 1843)  . sodium bicarbonate (isotonic) 150 mEq in D5W 1000 mL infusion       Assessment/Plan   1. 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 - HS Trop > 27,000, WBC 70K  - Echo w/ preserved LVEF 55-60%. RV function severely reduced  - ASA 81 - no  blocker w/ shock  - holding statin due to shock liver  2. Cardiogenic Shock w/ ? Component of Septic Shock  ? Cholesterol Emboli Syndrome - in setting of large inferior infarct - Lactic Acid >11K, CO2 8  - on dopamine 7.5 + NE 7, MAP 67  - Co-ox 42% - start bicarb gtt  - Stop dopamine and start Milrinone 0.25  - follow Co-ox and set up CVP monitoring. Would aim to keep CVP on higher side, pre-load dependent  - WBC 70K but AF ? Reactive from MI ? Cholesterol Emboli Syndrome  - Blood cultures pending  - ? Empiric broad spectrum abx - May need mechanica support  3. Shock Liver -  AST 3,604 -  ALT 1,881 -  Continue inotropic/pressor support -  Follow enzymes  4. AKI  - SCr 3.28  -  Baseline ~1.5  - inotropic/ pressor support - aim to keep MAP > 65 - follow BMP   5. Metastatic Bladder Cancer - being treated w/ chemo   Length of Stay: 0  Lyda Jester, PA-C  Nov 13, 2020, 7:56 PM  Advanced Heart Failure Team Pager [phone] (M-F; [place])  Please contact [facility] for night-coverage after hours (4p -7a ) and weekends on [email or web address]  80 y/o male with metastatic bladder CA s/p radical cystectomy 12/21. Recent hospitalization for AKI and hydronephrosis with peak creatinine 7.5 in 1/22 s/p percutaneous nephrostomy tubes  Came to ER this am with late-presenting inferior STEMI (hstrop > 27k on admit) . Taken emergently to cath lab. Unable to open RCA. Diffuse disease in LAD. BP in 60-70s felt related to RV involvement. Started on [place] and DA. LVEDP < 10.   Developed progressive shock throughout the day with mottling of all 4 extremities with severe pain in LLE.  Lactic acid > 11. Co-ox 41%. No urine output   Echo EF 50-55% RV severely dilated/HK.    General:  Critically ill appearing.Panting  Pale HEENT: normal Neck: supple. JVP 10 . Carotids 2+ bilat; no bruits. No lymphadenopathy or  thryomegaly appreciated. Cor: PMI nondisplaced. Regular rate & rhythm. No rubs, gallops or murmurs. Lungs: coarrse Abdomen: soft, nontender, nondistended. No hepatosplenomegaly. No bruits or masses. Good bowel sounds.  Extremities: no cyanosis, clubbing, rash, edema  Diffusely mottled all 4 extremities  Neuro: alert & orientedx3, cranial nerves grossly intact. moves all 4 extremities w/o difficulty.   He is terminally ill with advanced cardiogenic shock due primarily to RV failure.with profound lactic acidosis and distal ischemia.   I am not sure that there is much to do for him at this point. We will do our best to maximize perfusion with milrinone, NE and epi. Start bicarb gtt.   I have spoke to him and his family about code status. He wants intubation and shock currently but no  CPR.   I suspect he will pass in the next few hours.   I called his wife and updated her.   CRITICAL CARE Performed by: Glori Bickers  Total critical care time: 60 minutes  Critical care time was exclusive of separately billable procedures and treating other patients.  Critical care was necessary to treat or prevent imminent or life-threatening deterioration.  Critical care was time spent personally by me (independent of midlevel providers or residents) on the following activities: development of treatment plan with patient and/or surrogate as well as nursing, discussions with consultants, evaluation of patient's response to treatment, examination of patient, obtaining history from patient or surrogate, ordering and performing treatments and interventions, ordering and review of laboratory studies, ordering and review of radiographic studies, pulse oximetry and re-evaluation of patient's condition.  Lyda Jester, PA-C  7:56 PM     .

## 2020-11-19 NOTE — ED Triage Notes (Signed)
Pt had a fall this morning as he was walking to the bathroom due to weakness. No LOC. Pt has skin tear to right hand. Pt has bladder CA and is on chemo. EMS noticed EKG shows inferior MI. Pt denies CP, but does feel SOB. EMS gave 81mg  ASA. Pt alert and oriented. CBG 239

## 2020-11-19 NOTE — Discharge Summary (Signed)
Death Summary    Patient ID: Blake Peterson MRN: [id number]; DOB: 1941/05/31  Admit Date: Nov 23, 2020 Date of Death: November 23, 2020   Primary Care Provider: Ria Bush, MD  Primary Cardiologist: Shelva Majestic, MD  Primary Electrophysiologist:  None   Discharge Diagnoses    Principal Problem:   Cardiogenic shock [facility]) Active Problems:   Stage IV bladder cancer ([place])   Acute MI, inferior wall [facility])   STEMI involving right coronary artery ([place])   Elevated LFTs   Atrial fibrillation ([place])    Diagnostic Studies/Procedures    ECHO: November 23, 2020 1. Left ventricular ejection fraction, by estimation, is 50 to 55%. The  left ventricle has low normal function. The left ventricle has no regional  wall motion abnormalities. There is mild concentric left ventricular  hypertrophy. Left ventricular  diastolic parameters are consistent with Grade II diastolic dysfunction  (pseudonormalization). Elevated left atrial pressure. There is mild  hypokinesis of the left ventricular, basal-mid inferior wall.  2. McConnell's sign is seen, generally considered pathognomonic for acute  cor pulmonale (most commonly due to acute pulmonary embolism). Right  ventricular systolic function is severely reduced. The right ventricular  size is severely enlarged. There is  normal pulmonary artery systolic pressure. The estimated right ventricular  systolic pressure is 75.1 mmHg.  3. The mitral valve is normal in structure. Trivial mitral valve  regurgitation. No evidence of mitral stenosis.  4. Tricuspid valve regurgitation is mild to moderate.  5. The aortic valve is tricuspid. Aortic valve regurgitation is not  visualized. Mild aortic valve sclerosis is present, with no evidence of  aortic valve stenosis.  6. The inferior vena cava is dilated in size with >50% respiratory  variability, suggesting right atrial pressure of 8 mmHg.   Comparison(s): The left ventricular function is mildly worse. The  right  ventricular systolic function is significantly worse. The left ventricular  wall motion abnormality is new. Consider RV infarction due to proximal  right coronary occlusion, although  the right ventricular regional wall motion pattern is strongly suggestive  of pulmonary embolism and RV involvement is markedly out of proportion to  LV involvement.   CATH: Nov 23, 2020  1st Diag lesion is 80% stenosed.  Mid LAD to Dist LAD lesion is 90% stenosed.  Non-stenotic Ost Cx to Prox Cx lesion.  1st Mrg lesion is 40% stenosed.  Mid Cx lesion is 40% stenosed.  Ost RCA to Prox RCA lesion is 100% stenosed.  Post intervention, there is a 85% residual stenosis.  Prox RCA to Dist RCA lesion is 100% stenosed.   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  Initial presentation with hypotension, most likely secondary to large RCA/RV infarction which responded to intravenous fluids, levophed and dopamine inotropic therapy.  The patient left the catheterization laboratory pressure 18/70.  RECOMMENDATION: An echo Doppler study will be obtained today.  Pro time will be rechecked since after the completion of the study the pro time came back elevated at 1.9 most likely from auto anticoagulation in the setting of markedly elevated liver function  studies.  Plan to wean inotropic support as blood pressure allows.  He may ultimately require heart catheterization.  Discontinue atorvastatin with marked LFT elevation will  most likely need hematology/oncology, nephrology, and CHF evaluations.  ABIs: Dec 02, 2020 Arterial wall calcification precludes accurate ankle pressures and Right  ABI.    Summary:  Right: Resting right ankle-brachial index indicates noncompressible right  lower extremity arteries.   Left: Resting left ankle-brachial index indicates critical left limb  ischemia.   Absent-Unable to obtain signals or waveforms.    _____________   History of Present Illness     Blake Peterson is a 80 y.o. 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  Hospital Course   He was taken emergently to the Cath Lab, results are above.  He was felt to have had an out of hospital inferior MI several days ago.  PCI to the RCA was attempted but was unsuccessful.    His renal function and liver functions were also markedly abnormal.  He was felt to have decreased perfusion of his kidneys and his liver because of his MI.  This caused acute renal failure and shock liver.  Cardiogenic shock was felt to be driving multiple medical problems.  He was started on dopamine and Levophed for blood pressure support.  A CHF team consult was called.  He was seen by Dr. Haroldine Laws and cared for.  His lactic acid was greater than 11 and Cholox was 41%.  He had no urine output.  Echo results were reviewed.  He had significant RV failure.    He was felt to be terminally ill with advanced cardiogenic shock due primarily to RV failure with profound lactic acidosis and distal ischemia.  The dopamine was discontinued and he was started on milrinone and an epi drip in addition to the norepinephrine.  He was also started on a bicarb drip.  Discussions with the patient and his family were held about CODE STATUS.  He did not want CPR, but was willing to get intubation and  medications for shock.  Dr. Haroldine Laws updated [name]'s wife.  [name] was complaining of leg pain and his white count was extremely high at greater than 70,000.  ABIs were performed.  They showed critical limb ischemia and Dr. Donnetta Hutching with [facility] was asked to consult.  Dr. Donnetta Hutching did not feel the circulation problems were related to mechanical occlusion.  He felt it was related to profound shock and potential micro emboli.  No treatments were indicated, prognosis very poor.  Mr. Pink continued to deteriorate with hemodynamic instability and respiratory distress.  The family was at bedside.  The decision was made to switch to comfort care.  He was started on a morphine drip.  He died at 7:08 PM with his wife and son at the bedside  Consultants: CHF, [facility]   Time of death: 07:08 pm _____________  Labs & Radiologic Studies    CBC Lab Results  Component Value Date   WBC 70.0 (HH) 12/02/2020   HGB 8.2 (L) 12/02/20   HCT 25.5 (L) 02-Dec-2020   MCV 94.1 2020/12/02   PLT 506 (H) 83/41/9622    Basic Metabolic Panel Lab Results  Component Value Date   NA 128 (L) 02-Dec-2020   K 4.0 02-Dec-2020   CO2 8 (L) December 02, 2020   GLUCOSE 187 (H) 12-02-2020  BUN 40 (H) 11/19/20   CREATININE 3.26 (H) 2020-11-19   CALCIUM 8.2 (L) 2020/11/19   GFRNONAA 19 (L) November 19, 2020   GFRAA 30 (L) 06/19/2020    Liver Function Tests Lab Results  Component Value Date   ALT 1,881 (H) 19-Nov-2020   AST 3,604 (H) 19-Nov-2020   ALKPHOS 206 (H) November 19, 2020   BILITOT 0.7 Nov 19, 2020   High Sensitivity Troponin:   Recent Labs  Lab 2020/11/19 0730 19-Nov-2020 1412  TROPONINIHS >27,000* >27,000*    _____________  CARDIAC CATHETERIZATION  Result Date: 11/19/2020  1st Diag lesion is 80% stenosed.  Mid LAD to Dist LAD lesion is 90% stenosed.  Non-stenotic Ost Cx to Prox Cx lesion.  1st Mrg lesion is 40% stenosed.  Mid Cx lesion is 40% stenosed.  Ost RCA to Prox RCA lesion is 100% stenosed.  Post intervention,  there is a 85% residual stenosis.  Prox RCA to Dist RCA lesion is 100% stenosed.  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 Initial presentation with hypotension, most likely secondary to large RCA/RV infarction which responded to intravenous fluids, levophed and dopamine inotropic therapy.  The patient left the catheterization laboratory pressure 18/70. RECOMMENDATION: An echo Doppler study will be obtained today.  Pro time will be rechecked since after the completion of the study the pro time came back elevated at 1.9 most likely from auto anticoagulation in the setting of markedly elevated liver function studies.  Plan to wean inotropic support as blood pressure allows.  He may ultimately require heart catheterization.  Discontinue atorvastatin with marked LFT elevation will most likely need hematology/oncology, nephrology, and CHF evaluations.   DG CHEST PORT 1 VIEW  Result Date: 2020-11-19 CLINICAL DATA:  Myocardial infarction. EXAM: PORTABLE CHEST 1 VIEW COMPARISON:  04/24/2019 FINDINGS: Low volume film. Right Port-A-Cath is new in the interval. The cardiopericardial silhouette is within normal limits for size. The lungs are clear without focal pneumonia, edema, pneumothorax or pleural effusion. Telemetry leads overlie the chest. IMPRESSION: No active disease. Electronically Signed   By: Misty Stanley M.D.   On: 2020-11-19 13:28   VAS [place] ABI WITH/WO TBI  Result Date: 11/06/2020 LOWER EXTREMITY DOPPLER STUDY  Indications: Rest pain, and No Pulses. High Risk         Hypertension, hyperlipidemia, Diabetes, past history of Factors:          smoking.  Comparison Study: No previous Performing Technologist: Vonzell Schlatter RVT  Examination Guidelines: A complete evaluation includes at minimum, Doppler waveform signals and systolic blood pressure reading at the level of bilateral brachial, anterior tibial, and posterior tibial arteries, when vessel segments are accessible. Bilateral testing is considered an integral part of a complete examination. Photoelectric Plethysmograph (PPG) waveforms and toe systolic pressure readings are included as required and additional duplex testing as needed. Limited examinations for reoccurring indications may be performed as noted.  ABI Findings: +--------+------------------+-----+----------+--------+ Right   Rt Pressure (mmHg)IndexWaveform  Comment  +--------+------------------+-----+----------+--------+ [id number]                                       +--------+------------------+-----+----------+--------+  PTA     176               1.45 monophasic         +--------+------------------+-----+----------+--------+ DP      132               1.09 monophasic         +--------+------------------+-----+----------+--------+ +--------+------------------+-----+--------+-------+ Left    Lt Pressure (mmHg)IndexWaveformComment +--------+------------------+-----+--------+-------+ [id number]                                    +--------+------------------+-----+--------+-------+ PTA     0                 0.00 absent          +--------+------------------+-----+--------+-------+ DP      0                 0.00 absent          +--------+------------------+-----+--------+-------+ Arterial wall calcification precludes accurate ankle pressures and Right ABI.  Summary: Right: Resting right ankle-brachial index indicates noncompressible right lower extremity arteries.  Left: Resting left ankle-brachial index indicates critical left limb ischemia. Absent-Unable to obtain signals or waveforms.  *See table(s) above for measurements and observations.  Electronically signed by Harold Barban MD on 11/06/2020 at 9:50:29 PM.   Final    ECHOCARDIOGRAM COMPLETE  Result Date: 2020/11/06    ECHOCARDIOGRAM REPORT   Patient Name:   PASQUALE MATTERS Date of Exam: Nov 06, 2020 Medical Rec #:  [id number]        Height:       70.0 in Accession #:    [id number]       Weight:       177.0 lb Date of Birth:  October 15, 1940        BSA:          1.982 m Patient Age:    64 years         BP:           97/45 mmHg Patient Gender: M                HR:           117 bpm. Exam Location:  Inpatient Procedure: 2D Echo Indications:    Acute myocardial infarction, unspecified I21.9  History:        Patient has prior history of Echocardiogram examinations, most                 recent 04/25/2019. Arrythmias:Atrial Fibrillation; Risk                 Factors:Hypertension, Former Smoker and Dyslipidemia. Bladder                 cancer, Cardiogenic Shock.  Sonographer:    Leavy Cella Referring Phys: [place]  1. Left ventricular ejection fraction, by estimation, is 50 to 55%. The left ventricle has low normal function. The left ventricle has no regional wall motion abnormalities. There is mild concentric left ventricular hypertrophy. Left ventricular diastolic parameters are consistent with Grade II diastolic dysfunction (pseudonormalization). Elevated left atrial pressure. There is mild hypokinesis of the left ventricular, basal-mid inferior wall.  2. McConnell's sign is seen, generally considered pathognomonic for acute cor pulmonale (most commonly due to acute pulmonary embolism). Right ventricular systolic function is severely reduced. The right ventricular size is severely enlarged. There is  normal pulmonary artery systolic pressure. The estimated right ventricular systolic pressure is 92.1 mmHg.   3. The mitral valve is normal in structure. Trivial mitral valve regurgitation. No evidence of mitral stenosis.  4. Tricuspid valve regurgitation is mild to moderate.  5. The aortic valve is tricuspid. Aortic valve regurgitation is not visualized. Mild aortic valve sclerosis is present, with no evidence of aortic valve stenosis.  6. The inferior vena cava is dilated in size with >50% respiratory variability, suggesting right atrial pressure of 8 mmHg. Comparison(s): The left ventricular function is mildly worse. The right ventricular systolic function is significantly worse. The left ventricular wall motion abnormality is new. Consider RV infarction due to proximal right coronary occlusion, although the right ventricular regional wall motion pattern is strongly suggestive of pulmonary embolism and RV involvement is markedly out of proportion to LV involvement. FINDINGS  Left Ventricle: Left ventricular ejection fraction, by estimation, is 50 to 55%. The left ventricle has low normal function. The left ventricle has no regional wall motion abnormalities. Mild hypokinesis of the left ventricular, basal-mid inferior wall.  Definity contrast agent was given IV to delineate the left ventricular endocardial borders. The left ventricular internal cavity size was normal in size. There is mild concentric left ventricular hypertrophy. Left ventricular diastolic parameters are consistent with Grade II diastolic dysfunction (pseudonormalization). Elevated left atrial pressure. Right Ventricle: McConnell's sign is seen, generally considered pathognomonic for acute cor pulmonale (most commonly due to acute pulmonary embolism). The right ventricular size is severely enlarged. No increase in right ventricular wall thickness. Right  ventricular systolic function is severely reduced. There is normal pulmonary artery systolic pressure. The tricuspid regurgitant velocity is 1.97 m/s, and with an assumed right atrial pressure of 8  mmHg, the estimated right ventricular systolic pressure  is 19.4 mmHg. Left Atrium: Left atrial size was normal in size. Right Atrium: Right atrial size was normal in size. Pericardium: There is no evidence of pericardial effusion. Mitral Valve: The mitral valve is normal in structure. Trivial mitral valve regurgitation. No evidence of mitral valve stenosis. Tricuspid Valve: The tricuspid valve is normal in structure. Tricuspid valve regurgitation is mild to moderate. Aortic Valve: The aortic valve is tricuspid. Aortic valve regurgitation is not visualized. Mild aortic valve sclerosis is present, with no evidence of aortic valve stenosis. Pulmonic Valve: The pulmonic valve was grossly normal. Pulmonic valve regurgitation is not visualized. Aorta: The aortic root and ascending aorta are structurally normal, with no evidence of dilitation. Venous: The inferior vena cava is dilated in size with greater than 50% respiratory variability, suggesting right atrial pressure of 8 mmHg. IAS/Shunts: The interatrial septum appears to be lipomatous. No atrial level shunt detected by color flow Doppler.  LEFT VENTRICLE PLAX 2D LVIDd:         3.00 cm  Diastology LVIDs:         1.90 cm  LV e' medial:    3.81 cm/s LV PW:         1.50 cm  LV E/e' medial:  17.4 LV IVS:        1.30 cm  LV e' lateral:   6.42 cm/s LVOT diam:     1.80 cm  LV E/e' lateral: 10.3 LVOT Area:     2.54 cm  RIGHT VENTRICLE RV S prime:     3.83 cm/s LEFT ATRIUM           Index       RIGHT ATRIUM  Index LA diam:      3.50 cm 1.77 cm/m  RA Area:     13.10 cm LA Vol (A2C): 29.5 ml 14.89 ml/m RA Volume:   27.00 ml  13.62 ml/m LA Vol (A4C): 35.0 ml 17.66 ml/m   AORTA Ao Root diam: 2.90 cm MITRAL VALVE               TRICUSPID VALVE MV Area (PHT): 2.95 cm    TR Peak grad:   15.5 mmHg MV Decel Time: 257 msec    TR Vmax:        197.00 cm/s MV E velocity: 66.40 cm/s MV A velocity: 57.00 cm/s  SHUNTS MV E/A ratio:  1.16        Systemic Diam: 1.80 cm Dani Gobble  Croitoru MD Electronically signed by Sanda Klein MD Signature Date/Time: 11/18/2020/4:21:36 PM    Final     Duration of Death Summary Encounter   Greater than 30 minutes including physician time.  Signed, Rosaria Ferries, PA-C 11/09/2020, 9:32 AM

## 2020-11-19 NOTE — Consult Note (Addendum)
Advanced Heart Failure Team Consult Note   Primary Physician: Ria Bush, MD PCP-Cardiologist:  Shelva Majestic, MD  Reason for Consultation: Peri MI Cardiogenic Shock   HPI:    GEVORG BRUM is seen today for evaluation of peri MI cardiogenic shock at the request of Dr. Claiborne Billings, Cardiology.    80 y/o male w/ active metastatic bladder cancer being treated w/ palliative chemotherapy, HTN, T2DM, HLD and rheumatoid arthritis, admitted 2/15 w/ acute inferior STEMI c/b cardiogenic shock. Initial presentation with hypotension which responded to intravenous fluids, levophed and dopamine inotropic therapy.   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 Felt to have probable out of hospital inferior MI several days ago.     Hs Trop > 27,000. Lactic acid >11. WBC 70K. AF. COVID negative. SCr 3.3 (prior baseline ~1.5). K 4.3. AST 3,604. ALT 1,881.   Echo shows cor pulmonale w/ severe RV dysfunction. LVEF preserved 55-60%.   Currently on dopamine 7.5 and NE 6. MAP 67 by a-line. Co-ox low at 42%.   He is alert and oriented. CP free currently. No dyspnea but showing signs of hypoperfusion. Distal extremities moltted and cyanotic. Complaining of left leg pain. Cath performed via femoral artery. Vascular surgery has been consulted.  Hgb 8.2.     Review of Systems: [y] = yes, [ ]  = no   . General: Weight gain [ ] ; Weight loss [ ] ; Anorexia [ ] ; Fatigue [ ] ; Fever [ ] ; Chills [ ] ; Weakness [Y ]  . Cardiac: Chest pain/pressure [ ] ; Resting SOB  [ ] ; Exertional SOB [ ] ; Orthopnea [ ] ; Pedal Edema [ ] ; Palpitations [ ] ; Syncope [ ] ; Presyncope [ ] ; Paroxysmal nocturnal dyspnea[ ]   . Pulmonary: Cough [ ] ; Wheezing[ ] ; Hemoptysis[ ] ; Sputum [ ] ; Snoring [ ]   . GI: Vomiting[ ] ; Dysphagia[ ] ; Melena[ ] ; Hematochezia [ ] ; Heartburn[ ] ; Abdominal pain [ ] ; Constipation [ ] ; Diarrhea [ ] ; BRBPR [ ]   . GU: Hematuria[ ] ; Dysuria [ ] ; Nocturia[ ]   . Vascular: Pain in legs with walking [ ] ; Pain in feet with lying flat [ Y]; Non-healing sores [ ] ; Stroke [ ] ; TIA [ ] ; Slurred speech [ ] ;  . Neuro: Headaches[ ] ; Vertigo[ ] ; Seizures[ ] ; Paresthesias[ ] ;Blurred vision [ ] ; Diplopia [ ] ; Vision changes [ ]   . Ortho/Skin: Arthritis [Y ]; Joint pain [ ] ; Muscle pain [ ] ; Joint swelling [ ] ; Back Pain [ ] ; Rash [ ]   . Psych: Depression[ ] ; Anxiety[ ]   . Heme: Bleeding problems [ ] ; Clotting disorders [ ] ; Anemia [ ]   . Endocrine: Diabetes [ Y]; Thyroid dysfunction[ ]   Home Medications Prior to Admission medications   Medication Sig Start Date End Date Taking? Authorizing Provider  atenolol (TENORMIN) 50 MG tablet TAKE 1 TABLET BY MOUTH EVERY DAY Patient taking differently: Take 50 mg by mouth daily. 10/08/20   Ria Bush, MD  atorvastatin (LIPITOR) 80 MG tablet Take 0.5 tablets (40 mg total) by mouth daily. 10/25/20   Ria Bush, MD  Cholecalciferol (VITAMIN D-3 PO)  Take 50 mcg by mouth daily.     [provider]  folic acid (FOLVITE) 1 MG tablet Take 1 mg by mouth daily. 04/01/19   [provider]  prochlorperazine (COMPAZINE) 10 MG tablet TAKE 1 TABLET BY MOUTH EVERY 6 HOURS AS NEEDED FOR NAUSEA OR VOMITING. 10/31/20   Wyatt Portela, MD  vitamin B-12 (CYANOCOBALAMIN) 1000 MCG tablet Take 1,000 mcg by mouth daily.    [provider]    Past Medical History: Past Medical History:  Diagnosis Date  . Basal cell carcinoma 12/2016   R anterior tibia  . Cataract   . Diverticulosis of colon   . History of colonic  polyps   . HLD (hyperlipidemia)   . HTN (hypertension)   . Hypertensive retinopathy of both eyes, grade 1    [name]  . 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 Prediabetes   . Wears hearing aid in left ear     Past Surgical History: Past Surgical History:  Procedure Laterality Date  . CATARACT EXTRACTION, BILATERAL  06/2017  . COLONOSCOPY  11/2012   mild-mod diverticulosis, int hem, rec rpt 5 yrs [facility] Plan)  . COLONOSCOPY  01/2018   2 TA, mod diverticulosis, rpt 5 yrs [facility] Plan)  . CYSTOSCOPY WITH INJECTION N/A 08/30/2020   Procedure: CYSTOSCOPY WITH INJECTION;  Surgeon: Alexis Frock, MD;  Location: [facility];  Service: Urology;  Laterality: N/A;  . IR IMAGING GUIDED PORT INSERTION  09/23/2020  . IR NEPHROSTOMY PLACEMENT LEFT  10/10/2020  . IR NEPHROSTOMY PLACEMENT RIGHT  10/10/2020  . LAPAROSCOPY  08/30/2020   Procedure: LAPAROSCOPY WITH PELVIC LYMPH NODE DISSECTION, LEFT URETERAL LYSIS;  Surgeon: Alexis Frock, MD;  Location: [facility];  Service: Urology;;  . POLYPECTOMY    . TRANSURETHRAL RESECTION OF BLADDER TUMOR N/A 06/12/2019   Procedure: TRANSURETHRAL RESECTION OF BLADDER TUMOR (TURBT) WITH GEMCITABINE IN PACU;  Surgeon: Lucas Mallow, MD;  Location: [facility];  Service: Urology;  Laterality: N/A;  . TRANSURETHRAL RESECTION OF BLADDER TUMOR N/A 07/14/2019   Procedure: TRANSURETHRAL RESECTION OF BLADDER TUMOR (TURBT);  Surgeon: Lucas Mallow, MD;  Location: [facility];  Service: Urology;  Laterality: N/A;  . UMBILICAL HERNIA REPAIR  01/07/04    Family History: Family History  Problem Relation Age of Onset  . Hypertension Sister   . Colon cancer Sister 17  . Colon polyps Sister   . Heart attack Father 18  . Esophageal cancer Neg Hx   . Rectal cancer Neg Hx   . Stomach cancer Neg Hx     Social History: Social History   Socioeconomic History  . Marital status: Married    Spouse name: Not on file  . Number of children: 3  . Years of education: Not on file  . Highest  education level: Not on file  Occupational History  . 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 Smoking status: Former Smoker    Quit date: 11/10/1984    Years since quitting: 36.0  . Smokeless tobacco: Never Used  Vaping Use  . Vaping Use: Never used  Substance and Sexual Activity  . Alcohol use: Not Currently    Alcohol/week: 0.0 standard drinks  . Drug use: No  . Sexual activity: Never  Other Topics Concern  . Not on file  Social History Narrative   R handed   Lives with wife in single story home   Occupation: retired, was Clinical biochemist   Activity: Electrical engineer, tries walking  3x/wk   Diet: good water, fruits/vegetables daily      Tested at risk of OSA during hospitalization (06/2019)   Social Determinants of Health   Financial Resource Strain: Not on file  Food Insecurity: Not on file  Transportation Needs: Not on file  Physical Activity: Not on file  Stress: Not on file  Social Connections: Not on file    Allergies:  Allergies  Allergen Reactions  . Naprosyn [Naproxen] Swelling    Fingers and ankles swelling    Objective:    Vital Signs:   Temp:  [96.2 F (35.7 C)-96.8 F (36 C)] 96.2 F (35.7 C) (02/15 1548) Pulse Rate:  [25-232] 117 (02/15 0917) Resp:  [0-116] 22 (02/15 0917) BP: (62-117)/(35-87) 97/45 (02/15 0917) SpO2:  [0 %-98 %] 0 % (02/15 0917) Weight:  [80.3 kg] 80.3 kg (02/15 0725)    Weight change: Filed Weights   18-Nov-2020 0725  Weight: 80.3 kg    Intake/Output:  No intake or output data in the 24 hours ending 2020-11-18 1635    Physical Exam    General: elderly male, pale. No resp difficulty HEENT: normal Neck: supple. JVP . Carotids 2+ bilat; no bruits. No lymphadenopathy or thyromegaly appreciated. Cor: PMI nondisplaced. Regular rate & rhythm. No rubs, gallops or murmurs. Lungs: clear Abdomen: soft, nontender, nondistended. No hepatosplenomegaly. No bruits or masses. Good bowel sounds. Extremities:  mottled distal extremities and cyanotic digits no clubbing, rash, edema Neuro: alert & orientedx3, cranial nerves grossly intact. moves all 4 extremities w/o difficulty. Affect pleasant   Telemetry   NSR 60s   EKG    Afib w/ PACs inferior ST elevations, 67 bpm   Labs   Basic Metabolic Panel: Recent Labs  Lab 11-18-2020 0730 11-18-20 1412  NA 130*  --   K 4.3  --   CL 96*  --   CO2 8*  --   GLUCOSE 227*  --   BUN 41*  --   CREATININE 3.28* 3.26*  CALCIUM 8.2*  --     Liver Function Tests: Recent Labs  Lab 2020-11-18 0730  AST 3,604*  ALT 1,881*  ALKPHOS 206*  BILITOT 0.7  PROT 6.0*  ALBUMIN 2.0*   No results for input(s): LIPASE, AMYLASE in the last 168 hours. No results for input(s): AMMONIA in the last 168 hours.  CBC: Recent Labs  Lab 2020-11-18 0730 Nov 18, 2020 1412  WBC 53.6* 70.0*  NEUTROABS 21.4*  --   HGB 8.8* 8.2*  HCT 26.7* 25.5*  MCV 95.0 94.1  PLT 473* 506*    Cardiac Enzymes: No results for input(s): CKTOTAL, CKMB, CKMBINDEX, TROPONINI in the last 168 hours.  BNP: BNP (last 3 results) No results for input(s): BNP in the last 8760 hours.  ProBNP (last 3 results) No results for input(s): PROBNP in the last 8760 hours.   CBG: No results for input(s): GLUCAP in the last 168 hours.  Coagulation Studies: Recent Labs    18-Nov-2020 0730 11-18-2020 1412  LABPROT 21.5* 31.3*  INR 1.9* 3.1*     Imaging   CARDIAC CATHETERIZATION  Result Date: 18-Nov-2020  1st Diag lesion is 80% stenosed.  Mid LAD to Dist LAD lesion is 90% stenosed.  Non-stenotic Ost Cx to Prox Cx lesion.  1st Mrg lesion is 40% stenosed.  Mid Cx lesion is 40% stenosed.  Ost RCA to Prox RCA lesion is 100% stenosed.  Post intervention, there is a 85% residual stenosis.  Prox RCA to Dist RCA lesion is 100% stenosed.  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 Initial presentation with hypotension, most likely secondary to large RCA/RV infarction which responded to intravenous fluids, levophed and dopamine inotropic therapy.  The patient left the catheterization laboratory pressure 18/70. RECOMMENDATION: An echo Doppler study will be obtained today.  Pro time will be rechecked since after the completion of the study the pro time came back elevated at 1.9 most likely from auto anticoagulation in the setting of markedly elevated liver function studies.  Plan to wean inotropic support as blood pressure allows.  He may ultimately require heart catheterization.  Discontinue atorvastatin with marked LFT elevation will most likely need hematology/oncology, nephrology, and CHF evaluations.   DG CHEST PORT 1 VIEW  Result Date: 2020-11-15 CLINICAL DATA:  Myocardial infarction. EXAM: PORTABLE CHEST 1 VIEW COMPARISON:  04/24/2019 FINDINGS: Low volume film. Right Port-A-Cath is new in the interval. The cardiopericardial silhouette is within normal limits for size. The lungs are clear without focal pneumonia, edema, pneumothorax or pleural effusion. Telemetry leads overlie the chest. IMPRESSION: No active disease. Electronically Signed   By: Misty Stanley M.D.   On: 11-15-20 13:28   VAS [place] ABI WITH/WO TBI  Result Date: 11/15/2020 LOWER EXTREMITY DOPPLER STUDY Indications: Rest pain, and No Pulses. High Risk         Hypertension, hyperlipidemia, Diabetes, past history of Factors:          smoking.  Comparison Study: No previous Performing Technologist: Vonzell Schlatter RVT  Examination Guidelines: A complete  evaluation includes at minimum, Doppler waveform signals and systolic blood pressure reading at the level of bilateral brachial, anterior tibial, and posterior tibial arteries, when vessel segments are accessible. Bilateral testing is considered an integral part of a complete examination. Photoelectric Plethysmograph (PPG) waveforms and toe systolic pressure readings are included as required and additional duplex testing as needed. Limited examinations for reoccurring indications may be performed as noted.  ABI Findings: +--------+------------------+-----+----------+--------+ Right   Rt Pressure (mmHg)IndexWaveform  Comment  +--------+------------------+-----+----------+--------+ [id number]                                       +--------+------------------+-----+----------+--------+ PTA     176               1.45 monophasic         +--------+------------------+-----+----------+--------+ DP      132               1.09 monophasic         +--------+------------------+-----+----------+--------+ +--------+------------------+-----+--------+-------+ Left    Lt Pressure (mmHg)IndexWaveformComment +--------+------------------+-----+--------+-------+ [id number]                                    +--------+------------------+-----+--------+-------+ PTA     0                 0.00 absent          +--------+------------------+-----+--------+-------+  DP      0                 0.00 absent          +--------+------------------+-----+--------+-------+ Arterial wall calcification precludes accurate ankle pressures and Right ABI.  Summary: Right: Resting right ankle-brachial index indicates noncompressible right lower extremity arteries. Left: Resting left ankle-brachial index indicates critical left limb ischemia. Absent-Unable to obtain signals or waveforms.  *See table(s) above for measurements and observations.    Preliminary    ECHOCARDIOGRAM COMPLETE  Result Date: 2020-11-28     ECHOCARDIOGRAM REPORT   Patient Name:   RANVIR RENOVATO Date of Exam: 11-28-20 Medical Rec #:  [id number]        Height:       70.0 in Accession #:    [id number]       Weight:       177.0 lb Date of Birth:  Oct 19, 1940        BSA:          1.982 m Patient Age:    69 years         BP:           97/45 mmHg Patient Gender: M                HR:           117 bpm. Exam Location:  Inpatient Procedure: 2D Echo Indications:    Acute myocardial infarction, unspecified I21.9  History:        Patient has prior history of Echocardiogram examinations, most                 recent 04/25/2019. Arrythmias:Atrial Fibrillation; Risk                 Factors:Hypertension, Former Smoker and Dyslipidemia. Bladder                 cancer, Cardiogenic Shock.  Sonographer:    Leavy Cella Referring Phys: Delta  1. Left ventricular ejection fraction, by estimation, is 50 to 55%. The left ventricle has low normal function. The left ventricle has no regional wall motion abnormalities. There is mild concentric left ventricular hypertrophy. Left ventricular diastolic parameters are consistent with Grade II diastolic dysfunction (pseudonormalization). Elevated left atrial pressure. There is mild hypokinesis of the left ventricular, basal-mid inferior wall.  2. McConnell's sign is seen, generally considered pathognomonic for acute cor pulmonale (most commonly due to acute pulmonary embolism). Right ventricular systolic function is severely reduced. The right ventricular size is severely enlarged. There is normal pulmonary artery systolic pressure. The estimated right ventricular systolic pressure is 28.4 mmHg.  3. The mitral valve is normal in structure. Trivial mitral valve regurgitation. No evidence of mitral stenosis.  4. Tricuspid valve regurgitation is mild to moderate.  5. The aortic valve is tricuspid. Aortic valve regurgitation is not visualized. Mild aortic valve sclerosis is present, with no evidence of aortic  valve stenosis.  6. The inferior vena cava is dilated in size with >50% respiratory variability, suggesting right atrial pressure of 8 mmHg. Comparison(s): The left ventricular function is mildly worse. The right ventricular systolic function is significantly worse. The left ventricular wall motion abnormality is new. Consider RV infarction due to proximal right coronary occlusion, although the right ventricular regional wall motion pattern is strongly suggestive of pulmonary embolism and RV involvement is markedly out of proportion to LV involvement. FINDINGS  Left Ventricle: Left ventricular ejection  fraction, by estimation, is 50 to 55%. The left ventricle has low normal function. The left ventricle has no regional wall motion abnormalities. Mild hypokinesis of the left ventricular, basal-mid inferior wall.  Definity contrast agent was given IV to delineate the left ventricular endocardial borders. The left ventricular internal cavity size was normal in size. There is mild concentric left ventricular hypertrophy. Left ventricular diastolic parameters are consistent with Grade II diastolic dysfunction (pseudonormalization). Elevated left atrial pressure. Right Ventricle: McConnell's sign is seen, generally considered pathognomonic for acute cor pulmonale (most commonly due to acute pulmonary embolism). The right ventricular size is severely enlarged. No increase in right ventricular wall thickness. Right  ventricular systolic function is severely reduced. There is normal pulmonary artery systolic pressure. The tricuspid regurgitant velocity is 1.97 m/s, and with an assumed right atrial pressure of 8 mmHg, the estimated right ventricular systolic pressure  is 83.6 mmHg. Left Atrium: Left atrial size was normal in size. Right Atrium: Right atrial size was normal in size. Pericardium: There is no evidence of pericardial effusion. Mitral Valve: The mitral valve is normal in structure. Trivial mitral valve  regurgitation. No evidence of mitral valve stenosis. Tricuspid Valve: The tricuspid valve is normal in structure. Tricuspid valve regurgitation is mild to moderate. Aortic Valve: The aortic valve is tricuspid. Aortic valve regurgitation is not visualized. Mild aortic valve sclerosis is present, with no evidence of aortic valve stenosis. Pulmonic Valve: The pulmonic valve was grossly normal. Pulmonic valve regurgitation is not visualized. Aorta: The aortic root and ascending aorta are structurally normal, with no evidence of dilitation. Venous: The inferior vena cava is dilated in size with greater than 50% respiratory variability, suggesting right atrial pressure of 8 mmHg. IAS/Shunts: The interatrial septum appears to be lipomatous. No atrial level shunt detected by color flow Doppler.  LEFT VENTRICLE PLAX 2D LVIDd:         3.00 cm  Diastology LVIDs:         1.90 cm  LV e' medial:    3.81 cm/s LV PW:         1.50 cm  LV E/e' medial:  17.4 LV IVS:        1.30 cm  LV e' lateral:   6.42 cm/s LVOT diam:     1.80 cm  LV E/e' lateral: 10.3 LVOT Area:     2.54 cm  RIGHT VENTRICLE RV S prime:     3.83 cm/s LEFT ATRIUM           Index       RIGHT ATRIUM           Index LA diam:      3.50 cm 1.77 cm/m  RA Area:     13.10 cm LA Vol (A2C): 29.5 ml 14.89 ml/m RA Volume:   27.00 ml  13.62 ml/m LA Vol (A4C): 35.0 ml 17.66 ml/m   AORTA Ao Root diam: 2.90 cm MITRAL VALVE               TRICUSPID VALVE MV Area (PHT): 2.95 cm    TR Peak grad:   15.5 mmHg MV Decel Time: 257 msec    TR Vmax:        197.00 cm/s MV E velocity: 66.40 cm/s MV A velocity: 57.00 cm/s  SHUNTS MV E/A ratio:  1.16        Systemic Diam: 1.80 cm Dani Gobble Croitoru MD Electronically signed by Sanda Klein MD Signature Date/Time: 21-Nov-2020/4:21:36 PM    Final  Medications:     Current Medications: . aspirin  81 mg Oral Daily  . heparin  5,000 Units Subcutaneous Q8H  . sodium chloride flush  3 mL Intravenous Q12H     Infusions: . sodium  chloride 100 mL/hr at 11/06/20 1211  . sodium chloride    . DOPamine 7.5 mcg/kg/min (11-06-2020 1513)  . norepinephrine (LEVOPHED) Adult infusion 7 mcg/min (11-06-2020 1514)      Assessment/Plan   1. 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 - HS Trop > 27,000, WBC 70K  - Echo w/ preserved LVEF 55-60%. RV function severely reduced  - ASA 81 - no  blocker w/ shock  - holding statin due to shock liver  2. Cardiogenic Shock w/ ? Component of Septic Shock  ? Cholesterol Emboli Syndrome - in setting of large inferior infarct - Lactic Acid >11K, CO2 8  - on dopamine 7.5 + NE 7, MAP 67  - Co-ox 42% - start bicarb gtt  - Stop dopamine and start Milrinone 0.25  - follow Co-ox and set up CVP monitoring. Would aim to keep CVP on higher side, pre-load dependent  - WBC 70K but AF ? Reactive from MI ? Cholesterol Emboli Syndrome  - Blood cultures pending  - ? Empiric broad spectrum abx - May need mechanica support  3. Shock Liver -  AST 3,604 -  ALT 1,881 -  Continue inotropic/pressor support -  Follow enzymes  4. AKI  - SCr 3.28  - Baseline ~1.5  - inotropic/ pressor support - aim to keep MAP > 65 - follow BMP   5. Metastatic Bladder Cancer - being treated w/ chemo   Length of Stay: 0  Lyda Jester, PA-C  November 06, 2020, 4:35 PM  Advanced Heart Failure Team Pager [phone] (M-F; [place])  Please contact [facility] for night-coverage after hours (4p -7a ) and  weekends on [email or web address]  80 y/o male with metastatic bladder CA s/p radical cystectomy 12/21. Recent hospitalization for AKI and hydronephrosis with peak creatinine 7.5 in 1/22 s/p percutaneous nephrostomy tubes  Came to ER this am with late-presenting inferior STEMI (hstrop > 27k on admit) . Taken emergently to cath lab. Unable to open RCA. Diffuse disease in LAD. BP in 60-70s felt related to RV involvement. Started on [place] and DA. LVEDP < 10.   Developed progressive shock throughout the day with mottling of all 4 extremities with severe pain in LLE.  Lactic acid > 11. Co-ox 41%. No urine output   Echo EF 50-55% RV severely dilated/HK.    General:  Critically ill appearing.Panting  Pale HEENT: normal Neck: supple. JVP 10 . Carotids 2+ bilat; no bruits. No lymphadenopathy or thryomegaly appreciated. Cor: PMI nondisplaced. Regular rate & rhythm. No rubs, gallops or murmurs. Lungs: coarrse Abdomen: soft, nontender, nondistended. No hepatosplenomegaly. No bruits or masses. Good bowel sounds.  Extremities: no cyanosis, clubbing, rash, edema  Diffusely mottled all 4 extremities  Neuro: alert & orientedx3, cranial nerves grossly intact. moves all 4 extremities  w/o difficulty.   He is terminally ill with advanced cardiogenic shock due primarily to RV failure.with profound lactic acidosis and distal ischemia.   I am not sure that there is much to do for him at this point. We will do our best to maximize perfusion with milrinone, NE and epi. Start bicarb gtt.   I have spoke to him and his family about code status. He wants intubation and shock currently but no CPR.   I suspect he will pass in the next few hours.   I called his wife and updated her.   CRITICAL CARE Performed by: Glori Bickers  Total critical care time: 60 minutes  Critical care time was exclusive of separately billable procedures and treating other patients.  Critical care was necessary to treat or prevent imminent or  life-threatening deterioration.  Critical care was time spent personally by me (independent of midlevel providers or residents) on the following activities: development of treatment plan with patient and/or surrogate as well as nursing, discussions with consultants, evaluation of patient's response to treatment, examination of patient, obtaining history from patient or surrogate, ordering and performing treatments and interventions, ordering and review of laboratory studies, ordering and review of radiographic studies, pulse oximetry and re-evaluation of patient's condition.  Glori Bickers, MD  6:13 PM     .

## 2020-11-19 NOTE — ED Provider Notes (Signed)
[facility] EMERGENCY DEPARTMENT Provider Note   CSN: [id number] Arrival date & time: 11/22/2020  0720     History Chief Complaint  Patient presents with  . Fall  . Code STEMI    BLU LORI is a 80 y.o. male.  Pt presents to the ED today as a code stemi.  Pt said he got up this morning to use the bathroom.  He said he felt weak.  He fell and hit his head, but had no loc.  The pt denies any current cp, but did have cp earlier this week.  Pt feels sob.  No f/c.  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  He has not had a fever.  He is due for a palliative chemo treatment today.  Chemo started in January.  EMS did give pt 81 mg asa en route.          Past Medical History:  Diagnosis Date  . Basal cell carcinoma 12/2016   R anterior tibia  . Cataract   . Diverticulosis of colon   . History of colonic polyps   . HLD (hyperlipidemia)   . HTN (hypertension)   . Hypertensive retinopathy of both eyes, grade 1    [name]  . 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 Prediabetes   . Wears hearing aid in left ear     Patient Active Problem List   Diagnosis Date Noted  . Acute bilateral obstructive uropathy 10/10/2020  . Stage IV papillary adenocarcinoma of bladder ([place]) 06/08/2019  . Syncope and collapse 04/24/2019  . Weakness of both lower extremities 02/24/2019  . 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 High risk medication use 01/04/2019  . BPH (benign prostatic hyperplasia) 12/13/2018  . 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 Overweight (BMI 25.0-29.9)   . Hypertensive retinopathy of both eyes, grade 1   . Health maintenance examination 08/10/2014  . Goals of care, counseling/discussion 08/10/2014  . Bilateral hearing loss due to cerumen impaction 08/10/2014  . Essential tremor 09/09/2012  . Medicare annual  wellness visit, subsequent 08/07/2011  . History of colonic polyps   . Diverticulosis of colon   . HTN (hypertension)   . Hyperlipidemia associated with type 2 diabetes mellitus ([place])   . Controlled diabetes mellitus type 2 with complications ([place]) 18/29/9371  . Vitamin D deficiency 08/07/2010  . 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 Elevated PSA 02/16/2007    Past Surgical History:  Procedure Laterality Date  . CATARACT EXTRACTION, BILATERAL  06/2017  . COLONOSCOPY  11/2012   mild-mod diverticulosis, int hem, rec rpt 5 yrs [facility])  . COLONOSCOPY  01/2018   2 TA, mod diverticulosis, rpt 5 yrs [facility])  . CYSTOSCOPY WITH INJECTION N/A 08/30/2020   Procedure: CYSTOSCOPY WITH INJECTION;  Surgeon: Alexis Frock, MD;  Location: [facility];  Service: Urology;  Laterality: N/A;  . IR IMAGING GUIDED PORT INSERTION  09/23/2020  . IR NEPHROSTOMY PLACEMENT LEFT  10/10/2020  . IR NEPHROSTOMY PLACEMENT RIGHT  10/10/2020  . LAPAROSCOPY  08/30/2020   Procedure: LAPAROSCOPY WITH PELVIC LYMPH NODE DISSECTION, LEFT URETERAL LYSIS;  Surgeon: Alexis Frock, MD;  Location: [facility];  Service: Urology;;  . POLYPECTOMY    . TRANSURETHRAL RESECTION OF BLADDER TUMOR N/A 06/12/2019   Procedure: TRANSURETHRAL RESECTION OF BLADDER TUMOR (TURBT) WITH GEMCITABINE IN PACU;  Surgeon: Lucas Mallow, MD;  Location: [facility];  Service: Urology;  Laterality: N/A;  . TRANSURETHRAL RESECTION OF BLADDER TUMOR N/A 07/14/2019   Procedure: TRANSURETHRAL RESECTION OF BLADDER TUMOR (TURBT);  Surgeon: Lucas Mallow, MD;  Location: [facility];  Service: Urology;  Laterality: N/A;  . UMBILICAL HERNIA REPAIR  01/07/04       Family History  Problem Relation Age of Onset  . Hypertension Sister   . Colon cancer Sister 37  . Colon polyps Sister   . Heart attack Father 9  . Esophageal cancer Neg Hx   . Rectal cancer Neg Hx   . Stomach cancer Neg Hx     Social History   Tobacco Use  . Smoking  status: Former Smoker    Quit date: 11/10/1984    Years since quitting: 36.0  . Smokeless tobacco: Never Used  Vaping Use  . Vaping Use: Never used  Substance Use Topics  . Alcohol use: Not Currently    Alcohol/week: 0.0 standard drinks  . Drug use: No    Home Medications Prior to Admission medications   Medication Sig Start Date End Date Taking? Authorizing Provider  atenolol (TENORMIN) 50 MG tablet TAKE 1 TABLET BY MOUTH EVERY DAY Patient taking differently: Take 50 mg by mouth daily. 10/08/20   Ria Bush, MD  atorvastatin (LIPITOR) 80 MG tablet Take 0.5 tablets (40 mg total) by mouth daily. 10/25/20   Ria Bush, MD  Cholecalciferol (VITAMIN D-3 PO) Take 50 mcg by mouth daily.     [provider]  folic acid (FOLVITE) 1 MG tablet Take 1 mg by mouth daily. 04/01/19   [provider]  prochlorperazine (COMPAZINE) 10 MG tablet TAKE 1 TABLET BY MOUTH EVERY 6 HOURS AS NEEDED FOR NAUSEA OR VOMITING. 10/31/20   Wyatt Portela, MD  vitamin B-12 (CYANOCOBALAMIN) 1000 MCG tablet Take 1,000 mcg by mouth daily.    [provider]    Allergies    Naprosyn [naproxen]  Review of Systems   Review of Systems  Neurological: Positive for weakness.  All other systems reviewed and are negative.   Physical Exam Updated Vital Signs BP (!) 62/38 (BP Location: Right Arm)   Temp (!) 96.8 F (36 C) (Axillary)   Resp 16   Ht 5\' 10"  (1.778 m)   Wt 80.3 kg   SpO2 98%   BMI 25.40 kg/m   Physical Exam Vitals and nursing note reviewed.  Constitutional:      Appearance: Normal appearance.  HENT:     Head: Normocephalic and atraumatic.     Comments: Small abrasion to right face    Right Ear: External ear normal.     Left Ear: External ear normal.     Nose: Nose normal.     Mouth/Throat:     Mouth: Mucous membranes are dry.  Cardiovascular:     Rate and Rhythm: Normal rate and regular rhythm.     Pulses: Normal pulses.     Heart sounds: Normal heart  sounds.  Pulmonary:     Effort: Pulmonary effort is normal.     Breath sounds: Normal breath sounds.  Abdominal:     General: Abdomen is flat. Bowel sounds are normal.     Palpations: Abdomen is soft.  Musculoskeletal:        General: Normal range of motion.     Comments: Bilateral nephrostomy tubes noted  Skin:    General: Skin is warm.     Capillary Refill: Capillary refill takes less than 2  seconds.     Comments: Abrasion to right wrist  Neurological:     General: No focal deficit present.     Mental Status: He is alert and oriented to person, place, and time.  Psychiatric:        Mood and Affect: Mood normal.        Behavior: Behavior normal.        Thought Content: Thought content normal.        Judgment: Judgment normal.     ED Results / Procedures / Treatments   Labs (all labs ordered are listed, but only abnormal results are displayed) Labs Reviewed  PROTIME-INR - Abnormal; Notable for the following components:      Result Value   Prothrombin Time 21.5 (*)    INR 1.9 (*)    All other components within normal limits  APTT - Abnormal; Notable for the following components:   aPTT 40 (*)    All other components within normal limits  URINE CULTURE  CULTURE, BLOOD (ROUTINE X 2)  CULTURE, BLOOD (ROUTINE X 2)  RESP PANEL BY RT-PCR (FLU A&B, COVID) ARPGX2  LACTIC ACID, PLASMA  LACTIC ACID, PLASMA  COMPREHENSIVE METABOLIC PANEL  CBC WITH DIFFERENTIAL/PLATELET  URINALYSIS, ROUTINE W REFLEX MICROSCOPIC  TROPONIN I (HIGH SENSITIVITY)    EKG None  Radiology No results found.  Procedures Procedures   Medications Ordered in ED Medications  DOPamine (INTROPIN) 800 mg in dextrose 5 % 250 mL (3.2 mg/mL) infusion (10 mcg/kg/min  80.3 kg Intravenous New Bag/Given November 29, 2020 0804)  lactated ringers bolus 1,000 mL ( Intravenous MAR Hold 2020/11/29 0755)  heparin injection 4,000 Units ( Intravenous Automatically Held 11-29-2020 0800)    ED Course  I have reviewed the triage  vital signs and the nursing notes.  Pertinent labs & imaging results that were available during my care of the patient were reviewed by me and considered in my medical decision making (see chart for details).    MDM Rules/Calculators/A&P                          EKG does show some ST elevation in the inferior leads.  He is hypotensive.  IVFs started.  Cath lab is ready for him in the cath lab.      Final Clinical Impression(s) / ED Diagnoses Final diagnoses:  Acute MI, inferior wall ([place])  Stage IV bladder cancer [facility])    Rx / DC Orders ED Discharge Orders    None       Isla Pence, MD 29-Nov-2020 [phone]

## 2020-11-19 DEATH — deceased

## 2020-11-26 ENCOUNTER — Ambulatory Visit: Payer: PPO | Admitting: Oncology

## 2020-11-26 ENCOUNTER — Other Ambulatory Visit: Payer: PPO

## 2020-11-26 ENCOUNTER — Ambulatory Visit: Payer: PPO

## 2020-12-03 ENCOUNTER — Ambulatory Visit: Payer: PPO

## 2020-12-03 ENCOUNTER — Other Ambulatory Visit: Payer: PPO

## 2021-04-25 ENCOUNTER — Ambulatory Visit: Payer: PPO | Admitting: Family Medicine

## 2022-09-06 IMAGING — CT CT ABD-PELV W/O CM
2 of 4 series · 16 of 46 positions shown, 18 images · non-contrast
Comparison: CT abdomen and pelvis June 12, 2020.

CLINICAL DATA: History of urologic cancer assess treatment
response.

EXAM:
CT ABDOMEN AND PELVIS WITHOUT CONTRAST
TECHNIQUE: Multidetector CT imaging of the abdomen and pelvis was performed
following the standard protocol without IV contrast.

[Series 2: axial st · axial · 0.95mm/px · z∈[-497,-87]mm · 13 of 94 slices shown, 15 images]
[im 6/94  soft-tissue]
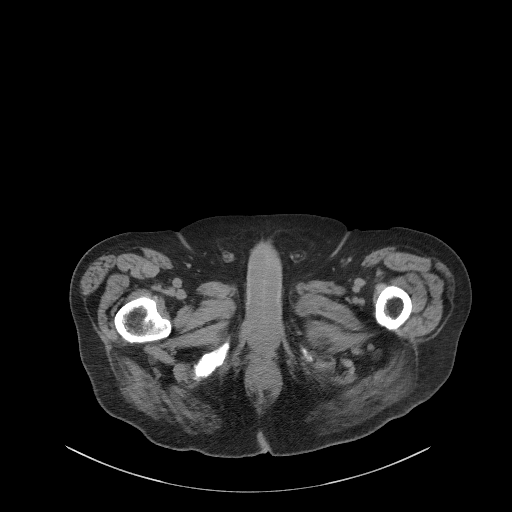
[im 6/94  bone]
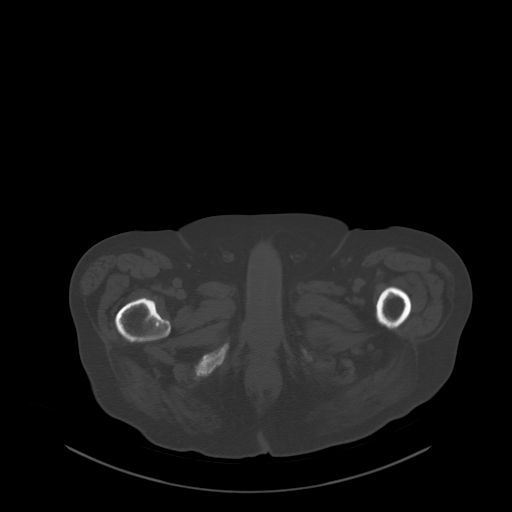
[im 11/94  soft-tissue]
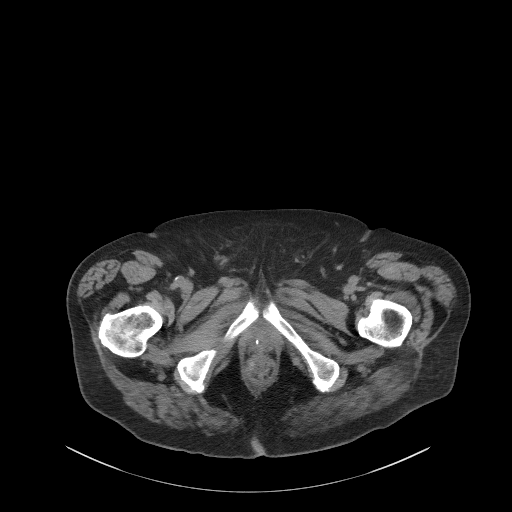
[im 21/94  soft-tissue]
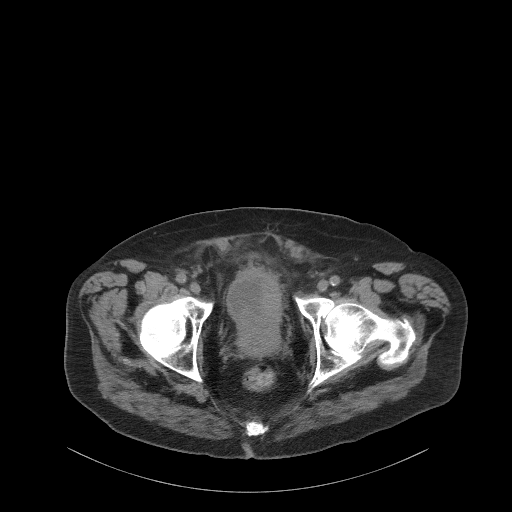
[im 26/94  soft-tissue]
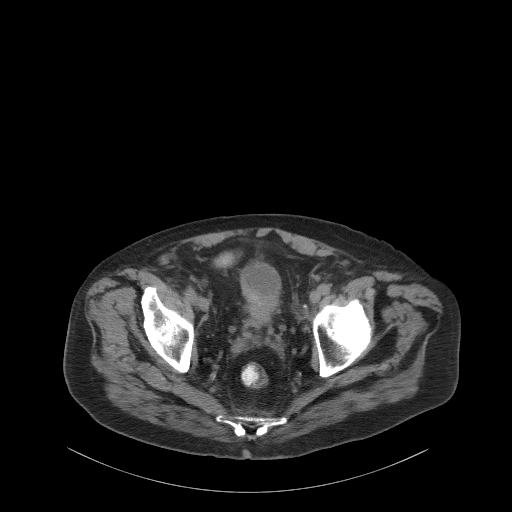
[im 32/94  soft-tissue]
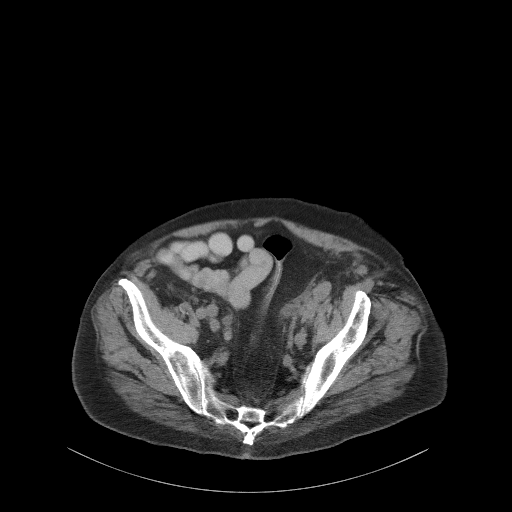
[im 42/94  soft-tissue]
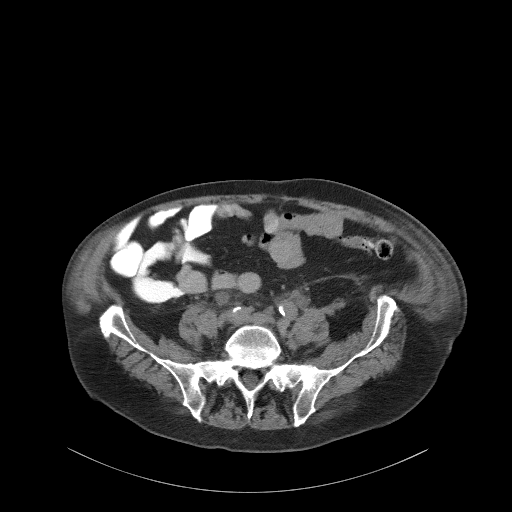
[im 47/94  soft-tissue]
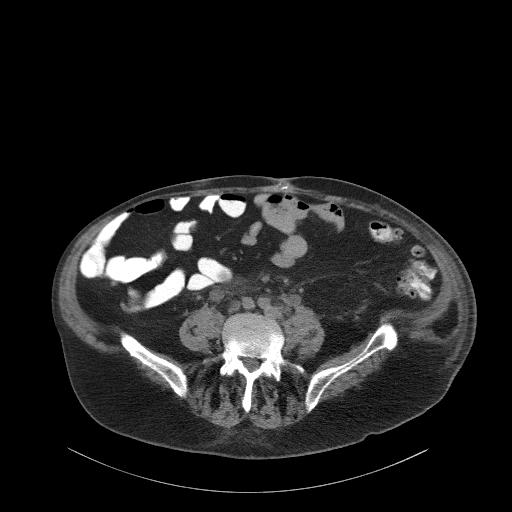
[im 52/94  soft-tissue]
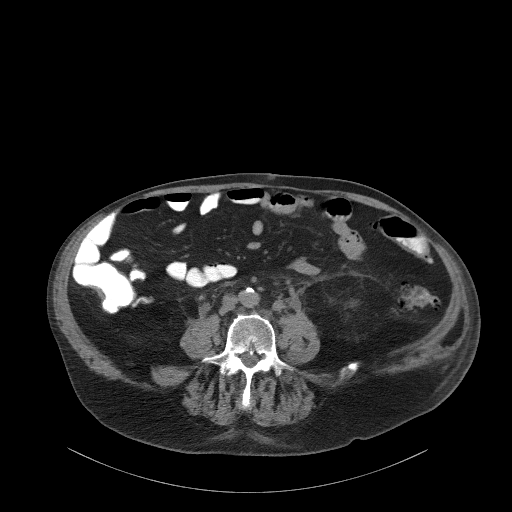
[im 63/94  soft-tissue]
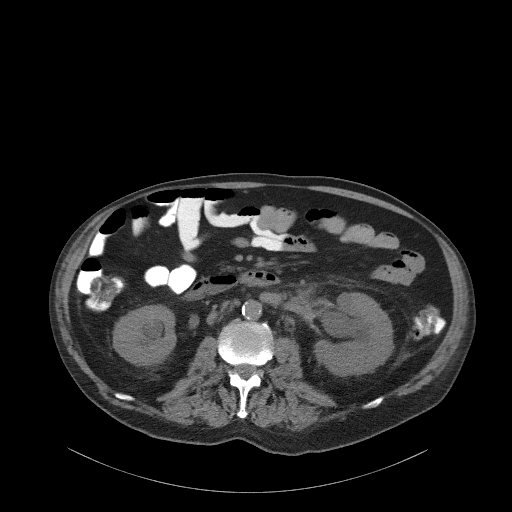
[im 63/94  bone]
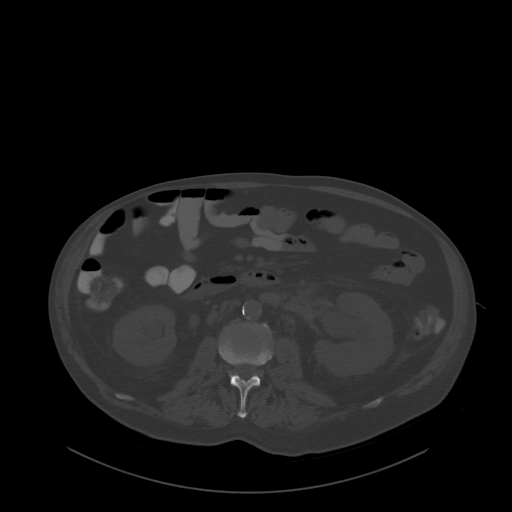
[im 68/94  soft-tissue]
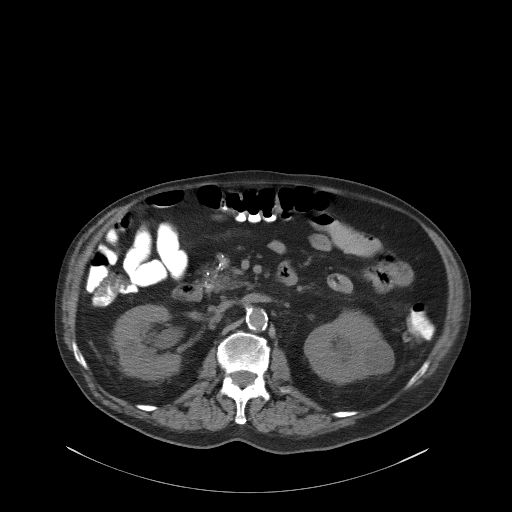
[im 73/94  soft-tissue]
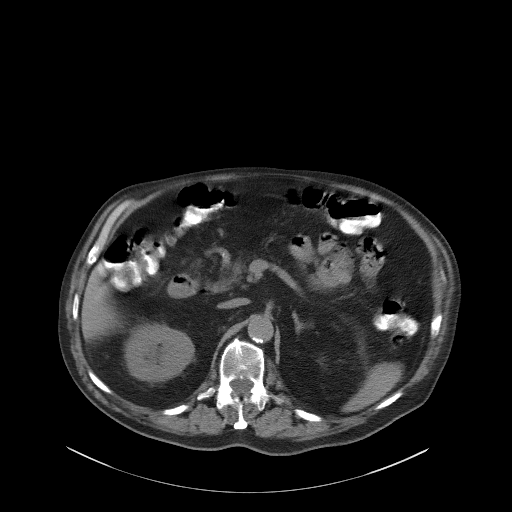
[im 83/94  soft-tissue]
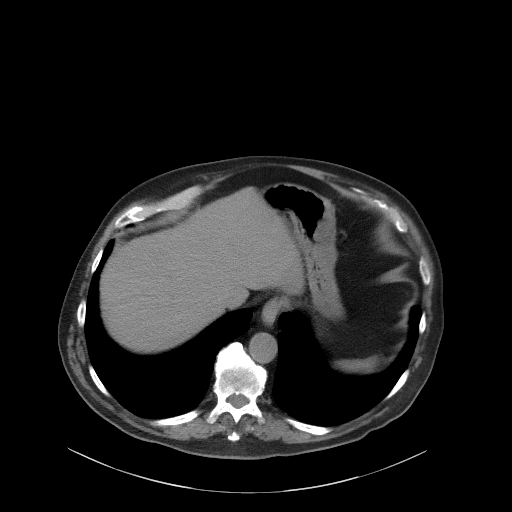
[im 88/94  soft-tissue]
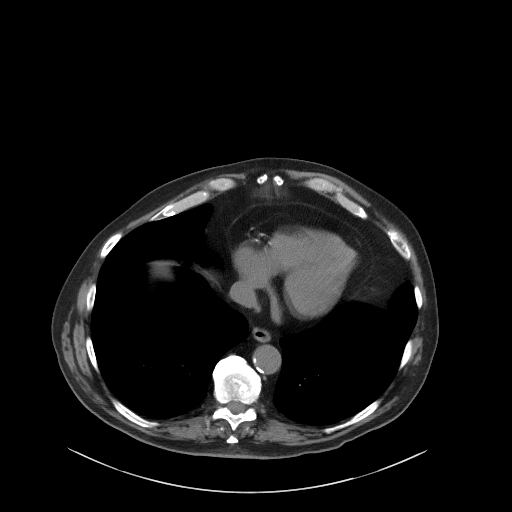

[Series 4: coronal st · coronal · 1.04mm/px · 3 of 94 slices shown]
[im 32/94  soft-tissue]
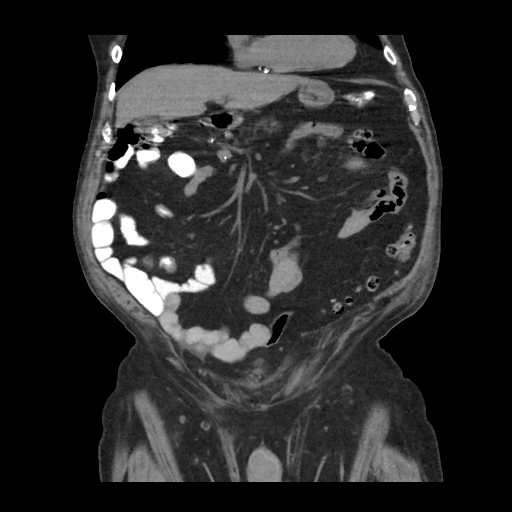
[im 42/94  soft-tissue]
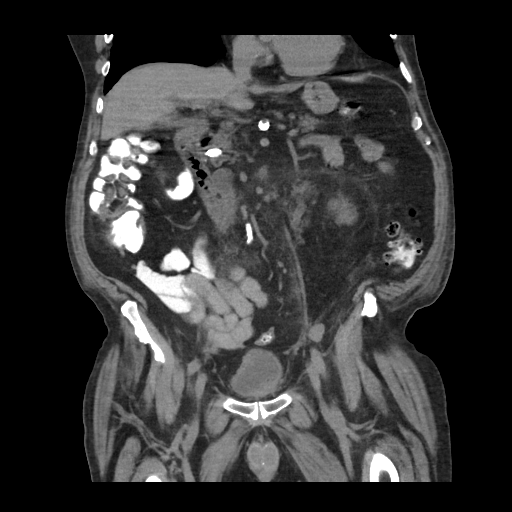
[im 52/94  soft-tissue]
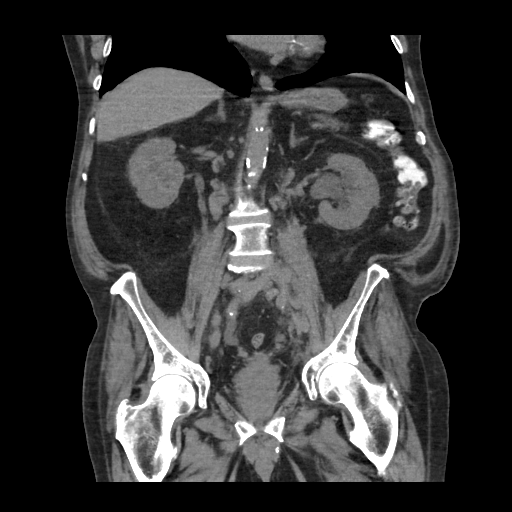

[16 of 46 positions shown; findings below may reference images not displayed]

FINDINGS: Lower chest: Coronary artery stents. No acute abnormality in the
lung bases.

Hepatobiliary: No suspicious hepatic lesions. Gallbladder is
unremarkable. No biliary ductal dilatation.

Pancreas: Similar fatty atrophy with calcifications in pancreatic
head. No pancreatic ductal dilatation or surrounding inflammatory
changes.

Spleen: Normal in size without focal abnormality.

Adrenals/Urinary Tract: The adrenal glands are unremarkable. There
are multiple bilateral renal cysts. Bilateral hydroureteronephrosis
to the level of the UVJ which is slightly increased on the left but
stable on the right. Bladder is predominantly decompressed. However,
there appears to be increased soft tissue thickening of the bladder
wall along the bladder dome and posterior bladder (series 2, image
[id number]).

Stomach/Bowel: Radiopaque enteric contrast to the level of the
rectum. Stomach appears within normal limits. Appendix appears
normal. No evidence of bowel wall thickening, distention or
inflammatory change. Diverticulosis of the descending and sigmoid
colon without findings of diverticulitis.

Vascular/Lymphatic: Aortic atherosclerosis. Left-sided infrarenal
IVC. Increased size and number of the index and non index
retroperitoneal and pelvic lymph nodes. Index lymph nodes are as
follows. Left periaortic lymph node measures 1.1 cm in shortest
axial dimension previously 0.8 cm (series 2, image 38). Left pelvic
sidewall lymph node now measures 1.1 cm in shortest axial dimension
previously 0.6 cm (series 2, image 64).

Reproductive: Prostatic calcifications with mild prostatomegaly.

Other: No ascites

Musculoskeletal: Multilevel degenerative change of the spine. No
suspicious lytic or blastic lesions of bone.
IMPRESSION: 1. Slightly increased soft tissue thickening of the bladder wall
with bilateral hydroureteronephrosis to the level of the UVJ which
is slightly increased on the left but stable on the right. Findings
concerning for disease progression.
2. Increased size and number of the index and non index
retroperitoneal and pelvic lymph nodes, concerning for increased
nodal metastatic disease.
3. Aortic atherosclerosis.

Aortic Atherosclerosis ([id number]).

## 2022-09-13 IMAGING — XA IR NEPHROSTOMY PLACEMENT LEFT
1 series · 4 of 4 positions shown · non-contrast
Comparison: none

CLINICAL DATA: Neoplasm of urinary bladder, worsening bilateral
hydronephrosis

EXAM:
BILATERAL PERCUTANEOUS NEPHROSTOMY CATHETER PLACEMENT UNDER
ULTRASOUND AND FLUOROSCOPIC GUIDANCE
TECHNIQUE: The procedure, risks (including but not limited to bleeding,
infection, organ damage ), benefits, and alternatives were explained
to the patient. Questions regarding the procedure were encouraged
and answered. The patient understands and consents to the procedure.

[Series 300: ir nephrostomy placement left · 4 of 4 slices shown]
[im 1/4]
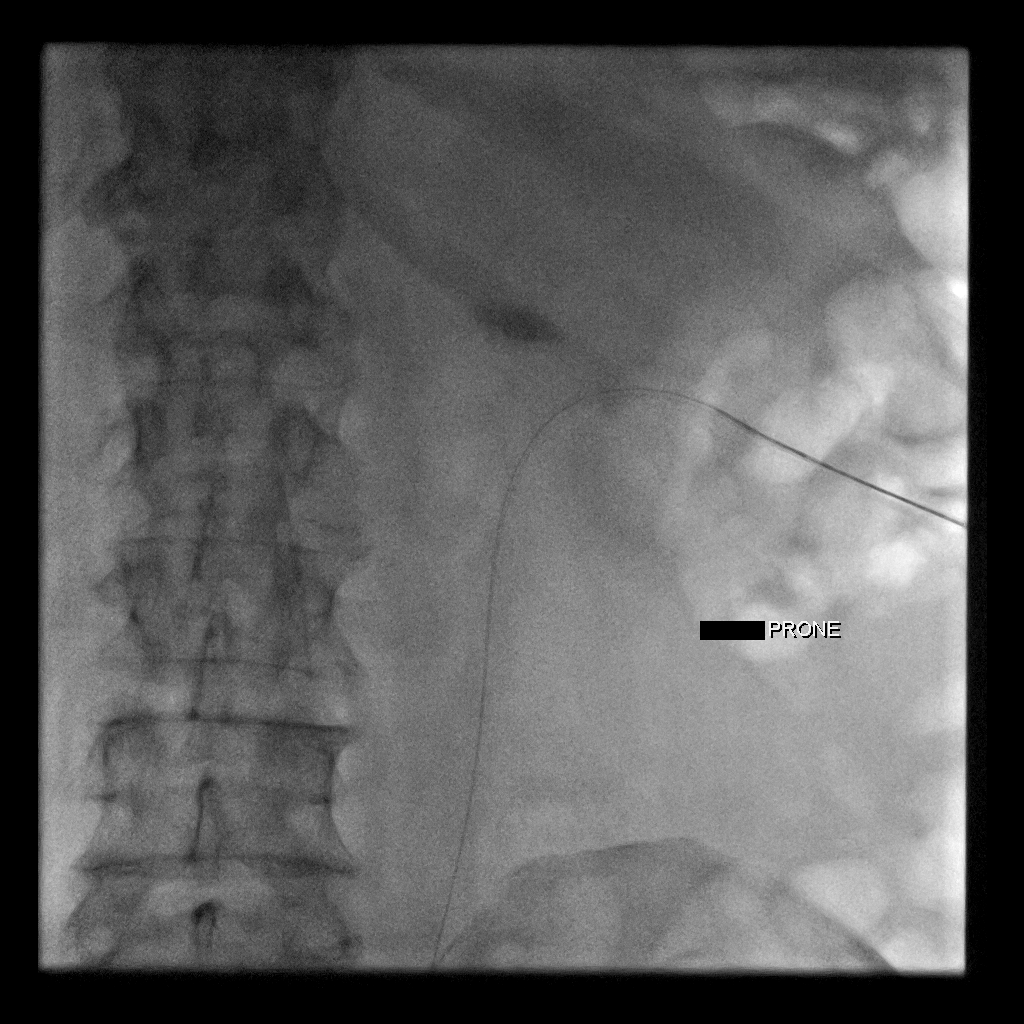
[im 2/4]
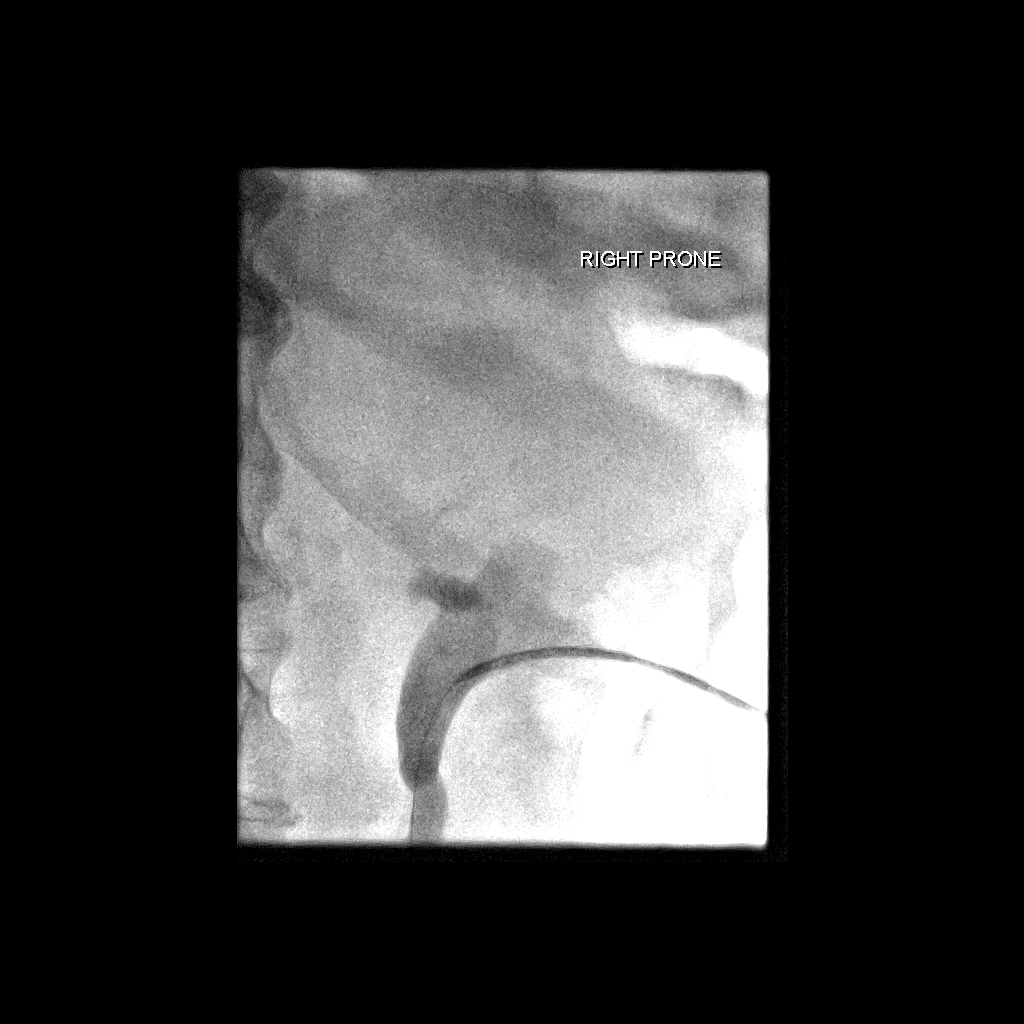
[im 3/4]
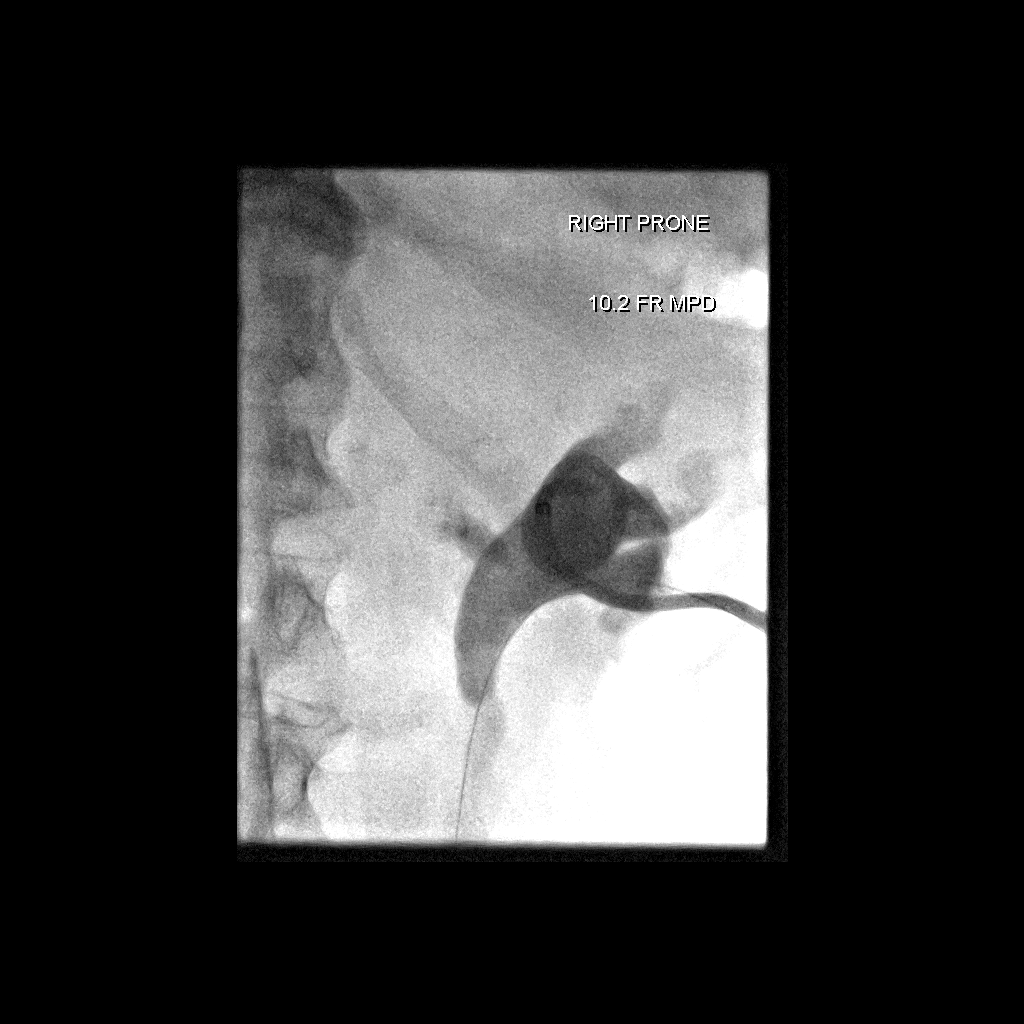
[im 4/4]
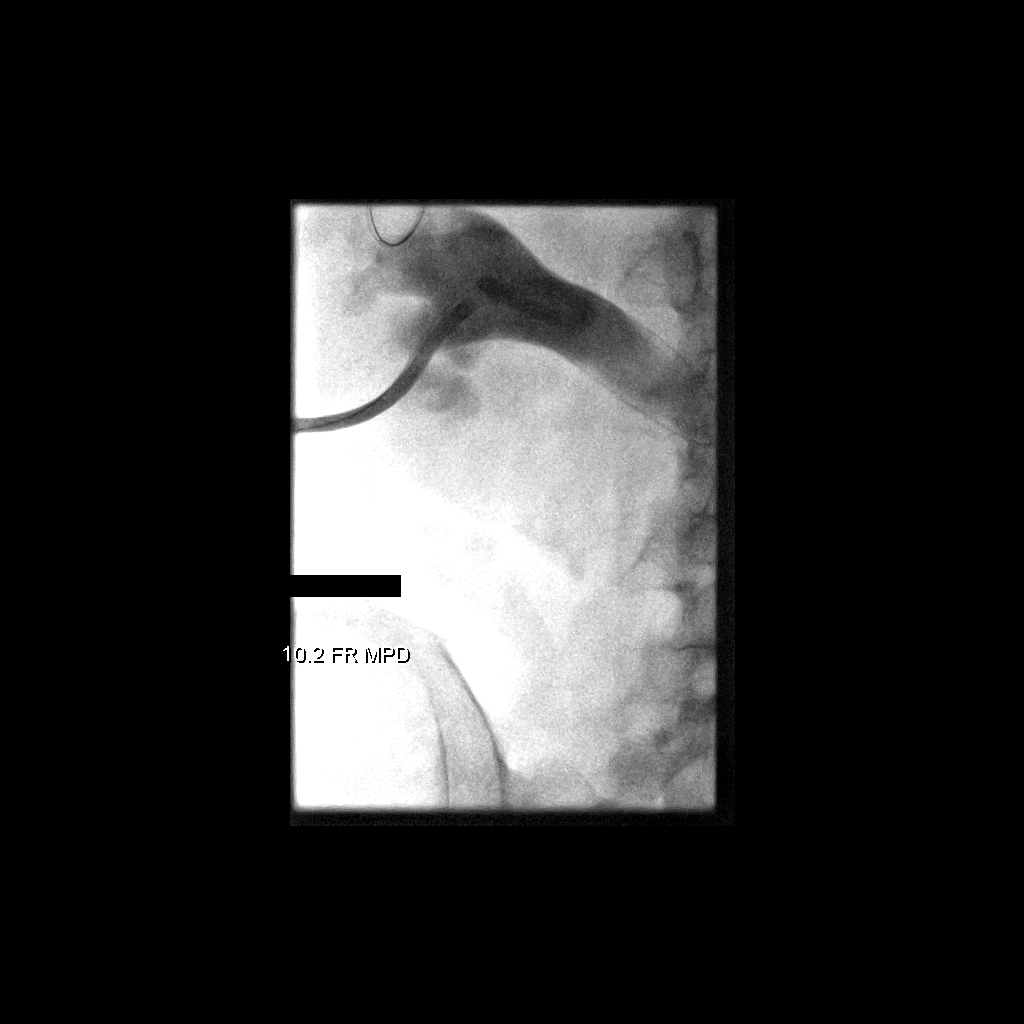

[4 of 4 positions shown; findings below may reference images not displayed]

Bilateralflank regions prepped with Betadine, draped in usual
sterile fashion, infiltrated locally with 1% lidocaine.Patient was
already receiving adequate prophylactic antibiotic coverage.

Intravenous Fentanyl 900mcg and Versed 1.5mg were administered as
conscious sedation during continuous monitoring of the patient's
level of consciousness and physiological / cardiorespiratory status
by the radiology RN, with a total moderate sedation time of 19
minutes. Under real-time ultrasound guidance, a 21-gauge trocar
needle was advanced into a posterior lower pole calyx of the right
renal collecting system. Ultrasound image documentation was saved.
Urine spontaneously returned through the needle. Needle was
exchanged over a guidewire for transitional dilator. Contrast
injection confirmed appropriate positioning. Catheter was exchanged
over a guidewire for a 10 French pigtail catheter, formed centrally
within the right renal collecting system. Contrast injection
confirms appropriate positioning and patency.

In similar fashion, under real-time ultrasound guidance, a 21-gauge
trocar needle was advanced into a posterior lower pole calyx of the
left renal collecting system. Ultrasound image documentation was
saved. Urine spontaneously returned through the needle. Needle was
exchanged over a guidewire for transitional dilator. Contrast
injection confirmed appropriate positioning. Catheter was exchanged
over a guidewire for a 10 French pigtail catheter, formed centrally
within the left renal collecting system. Contrast injection confirms
appropriate positioning and

Both catheters secured externally with 0 Prolene suture and
StatLock, and placed to external drain bags. The patient tolerated
the procedure well.

FLUOROSCOPY TIME:  2 minutes 36 second

COMPLICATIONS:
COMPLICATIONS
none
IMPRESSION: 1. Technically successful bilateral percutaneous nephrostomy
catheter placement.

## 2023-08-11 NOTE — Telephone Encounter (Signed)
Telephone call
